# Patient Record
Sex: Female | Born: 1945
Health system: Southern US, Community
[De-identification: ages and names within clinical notes are randomized; demographics above are authoritative.]

## PROBLEM LIST (undated history)

## (undated) DIAGNOSIS — J449 Chronic obstructive pulmonary disease, unspecified: Secondary | ICD-10-CM

## (undated) DIAGNOSIS — Z923 Personal history of irradiation: Secondary | ICD-10-CM

## (undated) DIAGNOSIS — T8859XA Other complications of anesthesia, initial encounter: Secondary | ICD-10-CM

## (undated) DIAGNOSIS — M1990 Unspecified osteoarthritis, unspecified site: Secondary | ICD-10-CM

## (undated) DIAGNOSIS — E785 Hyperlipidemia, unspecified: Secondary | ICD-10-CM

## (undated) DIAGNOSIS — I739 Peripheral vascular disease, unspecified: Secondary | ICD-10-CM

## (undated) DIAGNOSIS — M79606 Pain in leg, unspecified: Secondary | ICD-10-CM

## (undated) DIAGNOSIS — Z9889 Other specified postprocedural states: Secondary | ICD-10-CM

## (undated) DIAGNOSIS — R112 Nausea with vomiting, unspecified: Secondary | ICD-10-CM

## (undated) DIAGNOSIS — R51 Headache: Secondary | ICD-10-CM

## (undated) DIAGNOSIS — Z9221 Personal history of antineoplastic chemotherapy: Secondary | ICD-10-CM

## (undated) DIAGNOSIS — C801 Malignant (primary) neoplasm, unspecified: Secondary | ICD-10-CM

## (undated) DIAGNOSIS — T4145XA Adverse effect of unspecified anesthetic, initial encounter: Secondary | ICD-10-CM

## (undated) HISTORY — DX: Hyperlipidemia, unspecified: E78.5

## (undated) HISTORY — DX: Chronic obstructive pulmonary disease, unspecified: J44.9

## (undated) HISTORY — DX: Pain in leg, unspecified: M79.606

## (undated) HISTORY — DX: Unspecified osteoarthritis, unspecified site: M19.90

## (undated) HISTORY — DX: Headache: R51

## (undated) HISTORY — DX: Malignant (primary) neoplasm, unspecified: C80.1

## (undated) HISTORY — DX: Peripheral vascular disease, unspecified: I73.9

---

## 1973-03-15 HISTORY — PX: ABDOMINAL HYSTERECTOMY: SHX81

## 1975-03-16 HISTORY — PX: GANGLION CYST EXCISION: SHX1691

## 1998-04-04 ENCOUNTER — Other Ambulatory Visit: Admission: RE | Admit: 1998-04-04 | Discharge: 1998-04-04 | Payer: Self-pay | Admitting: Obstetrics and Gynecology

## 1998-08-29 ENCOUNTER — Encounter (INDEPENDENT_AMBULATORY_CARE_PROVIDER_SITE_OTHER): Payer: Self-pay

## 1998-08-29 ENCOUNTER — Ambulatory Visit (HOSPITAL_COMMUNITY): Admission: RE | Admit: 1998-08-29 | Discharge: 1998-08-29 | Payer: Self-pay | Admitting: *Deleted

## 1999-02-12 ENCOUNTER — Encounter: Admission: RE | Admit: 1999-02-12 | Discharge: 1999-02-12 | Payer: Self-pay | Admitting: Obstetrics and Gynecology

## 1999-02-12 ENCOUNTER — Encounter: Payer: Self-pay | Admitting: Obstetrics and Gynecology

## 1999-04-20 ENCOUNTER — Other Ambulatory Visit: Admission: RE | Admit: 1999-04-20 | Discharge: 1999-04-20 | Payer: Self-pay | Admitting: Obstetrics and Gynecology

## 1999-07-16 ENCOUNTER — Encounter: Payer: Self-pay | Admitting: Obstetrics and Gynecology

## 1999-07-16 ENCOUNTER — Encounter: Admission: RE | Admit: 1999-07-16 | Discharge: 1999-07-16 | Payer: Self-pay | Admitting: Obstetrics and Gynecology

## 2000-08-03 ENCOUNTER — Emergency Department (HOSPITAL_COMMUNITY): Admission: EM | Admit: 2000-08-03 | Discharge: 2000-08-03 | Payer: Self-pay | Admitting: Emergency Medicine

## 2000-08-11 ENCOUNTER — Encounter: Admission: RE | Admit: 2000-08-11 | Discharge: 2000-08-11 | Payer: Self-pay | Admitting: Obstetrics and Gynecology

## 2000-08-11 ENCOUNTER — Encounter: Payer: Self-pay | Admitting: Obstetrics and Gynecology

## 2000-09-19 ENCOUNTER — Other Ambulatory Visit: Admission: RE | Admit: 2000-09-19 | Discharge: 2000-09-19 | Payer: Self-pay | Admitting: Obstetrics and Gynecology

## 2001-08-03 ENCOUNTER — Other Ambulatory Visit: Admission: RE | Admit: 2001-08-03 | Discharge: 2001-08-03 | Payer: Self-pay | Admitting: Obstetrics and Gynecology

## 2001-08-14 ENCOUNTER — Encounter: Admission: RE | Admit: 2001-08-14 | Discharge: 2001-08-14 | Payer: Self-pay | Admitting: Obstetrics and Gynecology

## 2002-08-21 ENCOUNTER — Encounter: Payer: Self-pay | Admitting: Obstetrics and Gynecology

## 2002-08-21 ENCOUNTER — Encounter: Admission: RE | Admit: 2002-08-21 | Discharge: 2002-08-21 | Payer: Self-pay | Admitting: Obstetrics and Gynecology

## 2002-09-05 ENCOUNTER — Other Ambulatory Visit: Admission: RE | Admit: 2002-09-05 | Discharge: 2002-09-05 | Payer: Self-pay | Admitting: Obstetrics and Gynecology

## 2003-03-16 HISTORY — PX: BREAST LUMPECTOMY: SHX2

## 2003-10-10 ENCOUNTER — Encounter: Admission: RE | Admit: 2003-10-10 | Discharge: 2003-10-10 | Payer: Self-pay | Admitting: Obstetrics and Gynecology

## 2003-10-11 ENCOUNTER — Encounter: Admission: RE | Admit: 2003-10-11 | Discharge: 2003-10-11 | Payer: Self-pay | Admitting: Obstetrics and Gynecology

## 2003-10-11 ENCOUNTER — Encounter (INDEPENDENT_AMBULATORY_CARE_PROVIDER_SITE_OTHER): Payer: Self-pay | Admitting: *Deleted

## 2003-10-22 ENCOUNTER — Encounter: Admission: RE | Admit: 2003-10-22 | Discharge: 2003-10-22 | Payer: Self-pay | Admitting: *Deleted

## 2003-10-29 ENCOUNTER — Encounter: Admission: RE | Admit: 2003-10-29 | Discharge: 2003-10-29 | Payer: Self-pay | Admitting: General Surgery

## 2003-10-31 ENCOUNTER — Ambulatory Visit (HOSPITAL_COMMUNITY): Admission: RE | Admit: 2003-10-31 | Discharge: 2003-10-31 | Payer: Self-pay | Admitting: General Surgery

## 2003-10-31 ENCOUNTER — Encounter (INDEPENDENT_AMBULATORY_CARE_PROVIDER_SITE_OTHER): Payer: Self-pay | Admitting: *Deleted

## 2003-10-31 ENCOUNTER — Ambulatory Visit (HOSPITAL_BASED_OUTPATIENT_CLINIC_OR_DEPARTMENT_OTHER): Admission: RE | Admit: 2003-10-31 | Discharge: 2003-10-31 | Payer: Self-pay | Admitting: General Surgery

## 2003-10-31 ENCOUNTER — Encounter: Admission: RE | Admit: 2003-10-31 | Discharge: 2003-10-31 | Payer: Self-pay | Admitting: General Surgery

## 2003-11-06 ENCOUNTER — Encounter: Admission: RE | Admit: 2003-11-06 | Discharge: 2003-11-06 | Payer: Self-pay | Admitting: General Surgery

## 2003-11-11 ENCOUNTER — Encounter (INDEPENDENT_AMBULATORY_CARE_PROVIDER_SITE_OTHER): Payer: Self-pay | Admitting: Specialist

## 2003-11-11 ENCOUNTER — Ambulatory Visit (HOSPITAL_BASED_OUTPATIENT_CLINIC_OR_DEPARTMENT_OTHER): Admission: RE | Admit: 2003-11-11 | Discharge: 2003-11-11 | Payer: Self-pay | Admitting: General Surgery

## 2003-11-11 ENCOUNTER — Encounter: Admission: RE | Admit: 2003-11-11 | Discharge: 2003-11-11 | Payer: Self-pay | Admitting: *Deleted

## 2003-11-11 ENCOUNTER — Ambulatory Visit (HOSPITAL_COMMUNITY): Admission: RE | Admit: 2003-11-11 | Discharge: 2003-11-11 | Payer: Self-pay | Admitting: General Surgery

## 2003-11-29 ENCOUNTER — Other Ambulatory Visit: Admission: RE | Admit: 2003-11-29 | Discharge: 2003-11-29 | Payer: Self-pay | Admitting: Obstetrics and Gynecology

## 2003-12-06 ENCOUNTER — Ambulatory Visit (HOSPITAL_COMMUNITY): Admission: RE | Admit: 2003-12-06 | Discharge: 2003-12-06 | Payer: Self-pay | Admitting: Oncology

## 2003-12-10 ENCOUNTER — Ambulatory Visit: Admission: RE | Admit: 2003-12-10 | Discharge: 2004-01-22 | Payer: Self-pay | Admitting: *Deleted

## 2003-12-11 ENCOUNTER — Encounter: Payer: Self-pay | Admitting: Cardiovascular Disease

## 2003-12-11 ENCOUNTER — Ambulatory Visit: Admission: RE | Admit: 2003-12-11 | Discharge: 2003-12-11 | Payer: Self-pay | Admitting: Oncology

## 2003-12-19 ENCOUNTER — Ambulatory Visit (HOSPITAL_BASED_OUTPATIENT_CLINIC_OR_DEPARTMENT_OTHER): Admission: RE | Admit: 2003-12-19 | Discharge: 2003-12-19 | Payer: Self-pay | Admitting: General Surgery

## 2004-01-16 ENCOUNTER — Ambulatory Visit: Payer: Self-pay | Admitting: Oncology

## 2004-03-03 ENCOUNTER — Ambulatory Visit: Payer: Self-pay | Admitting: Oncology

## 2004-03-31 ENCOUNTER — Ambulatory Visit (HOSPITAL_COMMUNITY): Admission: RE | Admit: 2004-03-31 | Discharge: 2004-03-31 | Payer: Self-pay | Admitting: Oncology

## 2004-04-06 ENCOUNTER — Ambulatory Visit: Admission: RE | Admit: 2004-04-06 | Discharge: 2004-06-25 | Payer: Self-pay | Admitting: *Deleted

## 2004-04-17 ENCOUNTER — Ambulatory Visit (HOSPITAL_COMMUNITY): Admission: RE | Admit: 2004-04-17 | Discharge: 2004-04-17 | Payer: Self-pay | Admitting: Emergency Medicine

## 2004-04-20 ENCOUNTER — Ambulatory Visit (HOSPITAL_COMMUNITY): Admission: RE | Admit: 2004-04-20 | Discharge: 2004-04-21 | Payer: Self-pay | Admitting: Vascular Surgery

## 2004-04-20 HISTORY — PX: ILIAC ARTERY STENT: SHX1786

## 2004-04-21 ENCOUNTER — Ambulatory Visit: Payer: Self-pay | Admitting: Oncology

## 2004-04-28 ENCOUNTER — Ambulatory Visit (HOSPITAL_BASED_OUTPATIENT_CLINIC_OR_DEPARTMENT_OTHER): Admission: RE | Admit: 2004-04-28 | Discharge: 2004-04-28 | Payer: Self-pay | Admitting: General Surgery

## 2004-06-25 ENCOUNTER — Ambulatory Visit: Payer: Self-pay | Admitting: Oncology

## 2004-07-21 ENCOUNTER — Ambulatory Visit: Admission: RE | Admit: 2004-07-21 | Discharge: 2004-07-21 | Payer: Self-pay | Admitting: *Deleted

## 2004-10-12 ENCOUNTER — Encounter: Admission: RE | Admit: 2004-10-12 | Discharge: 2004-10-12 | Payer: Self-pay | Admitting: Oncology

## 2004-10-13 ENCOUNTER — Encounter: Admission: RE | Admit: 2004-10-13 | Discharge: 2004-10-13 | Payer: Self-pay | Admitting: Family Medicine

## 2004-12-18 ENCOUNTER — Ambulatory Visit (HOSPITAL_COMMUNITY): Admission: RE | Admit: 2004-12-18 | Discharge: 2004-12-18 | Payer: Self-pay | Admitting: Oncology

## 2004-12-23 ENCOUNTER — Other Ambulatory Visit: Admission: RE | Admit: 2004-12-23 | Discharge: 2004-12-23 | Payer: Self-pay | Admitting: Obstetrics and Gynecology

## 2004-12-24 ENCOUNTER — Ambulatory Visit: Payer: Self-pay | Admitting: Oncology

## 2005-06-25 ENCOUNTER — Ambulatory Visit: Payer: Self-pay | Admitting: Oncology

## 2005-06-25 LAB — COMPREHENSIVE METABOLIC PANEL
ALT: 21 U/L (ref 0–40)
CO2: 24 mEq/L (ref 19–32)
Calcium: 9.1 mg/dL (ref 8.4–10.5)
Chloride: 104 mEq/L (ref 96–112)
Glucose, Bld: 81 mg/dL (ref 70–99)
Sodium: 139 mEq/L (ref 135–145)
Total Bilirubin: 0.3 mg/dL (ref 0.3–1.2)
Total Protein: 6.6 g/dL (ref 6.0–8.3)

## 2005-06-25 LAB — CBC WITH DIFFERENTIAL/PLATELET
BASO%: 0.5 % (ref 0.0–2.0)
Eosinophils Absolute: 0.2 10*3/uL (ref 0.0–0.5)
HCT: 37.1 % (ref 34.8–46.6)
LYMPH%: 21.9 % (ref 14.0–48.0)
MONO#: 0.7 10*3/uL (ref 0.1–0.9)
NEUT#: 5.5 10*3/uL (ref 1.5–6.5)
NEUT%: 66.9 % (ref 39.6–76.8)
Platelets: 289 10*3/uL (ref 145–400)
WBC: 8.2 10*3/uL (ref 3.9–10.0)
lymph#: 1.8 10*3/uL (ref 0.9–3.3)

## 2005-06-25 LAB — CANCER ANTIGEN 27.29: CA 27.29: 15 U/mL (ref 0–39)

## 2005-10-13 ENCOUNTER — Encounter: Admission: RE | Admit: 2005-10-13 | Discharge: 2005-10-13 | Payer: Self-pay | Admitting: Oncology

## 2005-12-08 ENCOUNTER — Encounter: Admission: RE | Admit: 2005-12-08 | Discharge: 2005-12-08 | Payer: Self-pay | Admitting: Emergency Medicine

## 2005-12-22 ENCOUNTER — Ambulatory Visit: Payer: Self-pay | Admitting: Oncology

## 2006-06-21 ENCOUNTER — Ambulatory Visit: Payer: Self-pay | Admitting: Oncology

## 2006-06-24 LAB — COMPREHENSIVE METABOLIC PANEL
ALT: 19 U/L (ref 0–35)
AST: 16 U/L (ref 0–37)
Albumin: 4.4 g/dL (ref 3.5–5.2)
CO2: 27 mEq/L (ref 19–32)
Calcium: 9.5 mg/dL (ref 8.4–10.5)
Chloride: 103 mEq/L (ref 96–112)
Creatinine, Ser: 0.72 mg/dL (ref 0.40–1.20)
Potassium: 4.3 mEq/L (ref 3.5–5.3)
Sodium: 140 mEq/L (ref 135–145)
Total Protein: 6.7 g/dL (ref 6.0–8.3)

## 2006-06-24 LAB — CBC WITH DIFFERENTIAL/PLATELET
BASO%: 0.6 % (ref 0.0–2.0)
EOS%: 3.6 % (ref 0.0–7.0)
HCT: 37 % (ref 34.8–46.6)
MCH: 33.2 pg (ref 26.0–34.0)
MCHC: 35.5 g/dL (ref 32.0–36.0)
MONO#: 0.6 10*3/uL (ref 0.1–0.9)
NEUT%: 61.7 % (ref 39.6–76.8)
RBC: 3.95 10*6/uL (ref 3.70–5.32)
RDW: 13 % (ref 11.3–14.5)
WBC: 8.9 10*3/uL (ref 3.9–10.0)
lymph#: 2.4 10*3/uL (ref 0.9–3.3)

## 2006-06-24 LAB — CANCER ANTIGEN 27.29: CA 27.29: 13 U/mL (ref 0–39)

## 2006-10-17 ENCOUNTER — Encounter: Admission: RE | Admit: 2006-10-17 | Discharge: 2006-10-17 | Payer: Self-pay | Admitting: Oncology

## 2006-11-15 ENCOUNTER — Ambulatory Visit: Payer: Self-pay | Admitting: Vascular Surgery

## 2006-12-13 ENCOUNTER — Ambulatory Visit: Payer: Self-pay | Admitting: Oncology

## 2006-12-15 LAB — CBC WITH DIFFERENTIAL/PLATELET
EOS%: 0.2 % (ref 0.0–7.0)
LYMPH%: 10.1 % — ABNORMAL LOW (ref 14.0–48.0)
MCH: 32.8 pg (ref 26.0–34.0)
MCV: 93.6 fL (ref 81.0–101.0)
MONO%: 5.2 % (ref 0.0–13.0)
RBC: 3.92 10*6/uL (ref 3.70–5.32)
RDW: 13.1 % (ref 11.3–14.5)

## 2006-12-16 LAB — COMPREHENSIVE METABOLIC PANEL
AST: 14 U/L (ref 0–37)
Albumin: 4.2 g/dL (ref 3.5–5.2)
Alkaline Phosphatase: 65 U/L (ref 39–117)
BUN: 19 mg/dL (ref 6–23)
Potassium: 3.8 mEq/L (ref 3.5–5.3)
Sodium: 138 mEq/L (ref 135–145)
Total Bilirubin: 0.3 mg/dL (ref 0.3–1.2)
Total Protein: 6.6 g/dL (ref 6.0–8.3)

## 2007-06-20 ENCOUNTER — Ambulatory Visit: Payer: Self-pay | Admitting: Oncology

## 2007-06-22 LAB — COMPREHENSIVE METABOLIC PANEL
BUN: 19 mg/dL (ref 6–23)
CO2: 24 mEq/L (ref 19–32)
Calcium: 9.1 mg/dL (ref 8.4–10.5)
Chloride: 106 mEq/L (ref 96–112)
Creatinine, Ser: 0.84 mg/dL (ref 0.40–1.20)

## 2007-06-22 LAB — CBC WITH DIFFERENTIAL/PLATELET
Basophils Absolute: 0 10*3/uL (ref 0.0–0.1)
HCT: 36.8 % (ref 34.8–46.6)
HGB: 12.9 g/dL (ref 11.6–15.9)
MCH: 32.5 pg (ref 26.0–34.0)
MONO#: 0.4 10*3/uL (ref 0.1–0.9)
NEUT%: 73.8 % (ref 39.6–76.8)
WBC: 9.1 10*3/uL (ref 3.9–10.0)
lymph#: 1.8 10*3/uL (ref 0.9–3.3)

## 2007-06-22 LAB — CANCER ANTIGEN 27.29: CA 27.29: 16 U/mL (ref 0–39)

## 2007-06-24 ENCOUNTER — Ambulatory Visit (HOSPITAL_COMMUNITY): Admission: RE | Admit: 2007-06-24 | Discharge: 2007-06-24 | Payer: Self-pay | Admitting: Oncology

## 2007-10-17 ENCOUNTER — Ambulatory Visit: Payer: Self-pay | Admitting: Vascular Surgery

## 2007-10-18 ENCOUNTER — Encounter: Admission: RE | Admit: 2007-10-18 | Discharge: 2007-10-18 | Payer: Self-pay | Admitting: Oncology

## 2007-12-22 ENCOUNTER — Ambulatory Visit: Payer: Self-pay | Admitting: Oncology

## 2007-12-26 LAB — CBC WITH DIFFERENTIAL/PLATELET
BASO%: 1.4 % (ref 0.0–2.0)
HCT: 37.3 % (ref 34.8–46.6)
LYMPH%: 21.7 % (ref 14.0–48.0)
MCH: 33 pg (ref 26.0–34.0)
MCHC: 34.8 g/dL (ref 32.0–36.0)
MCV: 94.8 fL (ref 81.0–101.0)
MONO#: 0.5 10*3/uL (ref 0.1–0.9)
MONO%: 5.6 % (ref 0.0–13.0)
NEUT%: 68.8 % (ref 39.6–76.8)
Platelets: 255 10*3/uL (ref 145–400)
WBC: 8.3 10*3/uL (ref 3.9–10.0)

## 2007-12-26 LAB — COMPREHENSIVE METABOLIC PANEL
ALT: 38 U/L — ABNORMAL HIGH (ref 0–35)
Alkaline Phosphatase: 79 U/L (ref 39–117)
CO2: 23 mEq/L (ref 19–32)
Creatinine, Ser: 0.96 mg/dL (ref 0.40–1.20)
Total Bilirubin: 0.3 mg/dL (ref 0.3–1.2)

## 2007-12-26 LAB — CANCER ANTIGEN 27.29: CA 27.29: 15 U/mL (ref 0–39)

## 2008-06-21 ENCOUNTER — Ambulatory Visit: Payer: Self-pay | Admitting: Oncology

## 2008-06-25 LAB — LACTATE DEHYDROGENASE: LDH: 170 U/L (ref 94–250)

## 2008-06-25 LAB — COMPREHENSIVE METABOLIC PANEL
ALT: 29 U/L (ref 0–35)
AST: 18 U/L (ref 0–37)
Albumin: 3.7 g/dL (ref 3.5–5.2)
CO2: 29 mEq/L (ref 19–32)
Calcium: 9.3 mg/dL (ref 8.4–10.5)
Chloride: 105 mEq/L (ref 96–112)
Potassium: 4.1 mEq/L (ref 3.5–5.3)
Total Protein: 6.8 g/dL (ref 6.0–8.3)

## 2008-06-25 LAB — CBC WITH DIFFERENTIAL/PLATELET
BASO%: 0.2 % (ref 0.0–2.0)
EOS%: 1.5 % (ref 0.0–7.0)
HCT: 39.3 % (ref 34.8–46.6)
HGB: 13.4 g/dL (ref 11.6–15.9)
MCH: 32.1 pg (ref 25.1–34.0)
MCHC: 34 g/dL (ref 31.5–36.0)
MONO#: 0.3 10*3/uL (ref 0.1–0.9)
RDW: 14.1 % (ref 11.2–14.5)
WBC: 10.1 10*3/uL (ref 3.9–10.3)
lymph#: 2 10*3/uL (ref 0.9–3.3)

## 2008-10-22 ENCOUNTER — Encounter: Admission: RE | Admit: 2008-10-22 | Discharge: 2008-10-22 | Payer: Self-pay | Admitting: Oncology

## 2008-10-29 ENCOUNTER — Ambulatory Visit: Payer: Self-pay | Admitting: Vascular Surgery

## 2009-01-02 ENCOUNTER — Ambulatory Visit: Payer: Self-pay | Admitting: Oncology

## 2009-01-06 LAB — COMPREHENSIVE METABOLIC PANEL
Albumin: 3.8 g/dL (ref 3.5–5.2)
Alkaline Phosphatase: 63 U/L (ref 39–117)
BUN: 15 mg/dL (ref 6–23)
CO2: 29 mEq/L (ref 19–32)
Glucose, Bld: 94 mg/dL (ref 70–99)
Sodium: 139 mEq/L (ref 135–145)
Total Bilirubin: 0.5 mg/dL (ref 0.3–1.2)
Total Protein: 6.6 g/dL (ref 6.0–8.3)

## 2009-01-06 LAB — CBC WITH DIFFERENTIAL/PLATELET
Basophils Absolute: 0 10*3/uL (ref 0.0–0.1)
Eosinophils Absolute: 0.2 10*3/uL (ref 0.0–0.5)
HCT: 39.2 % (ref 34.8–46.6)
HGB: 13.1 g/dL (ref 11.6–15.9)
LYMPH%: 22.4 % (ref 14.0–49.7)
MCV: 95.7 fL (ref 79.5–101.0)
MONO#: 0.6 10*3/uL (ref 0.1–0.9)
MONO%: 7 % (ref 0.0–14.0)
NEUT#: 5.9 10*3/uL (ref 1.5–6.5)
Platelets: 255 10*3/uL (ref 145–400)
WBC: 8.7 10*3/uL (ref 3.9–10.3)

## 2009-01-07 LAB — VITAMIN D 25 HYDROXY (VIT D DEFICIENCY, FRACTURES): Vit D, 25-Hydroxy: 26 ng/mL — ABNORMAL LOW (ref 30–89)

## 2009-01-07 LAB — CANCER ANTIGEN 27.29: CA 27.29: 16 U/mL (ref 0–39)

## 2009-10-23 ENCOUNTER — Encounter: Admission: RE | Admit: 2009-10-23 | Discharge: 2009-10-23 | Payer: Self-pay | Admitting: Obstetrics and Gynecology

## 2009-11-21 ENCOUNTER — Ambulatory Visit: Payer: Self-pay | Admitting: Vascular Surgery

## 2010-05-05 ENCOUNTER — Other Ambulatory Visit: Payer: Self-pay | Admitting: Family Medicine

## 2010-05-05 DIAGNOSIS — N281 Cyst of kidney, acquired: Secondary | ICD-10-CM

## 2010-05-19 ENCOUNTER — Ambulatory Visit
Admission: RE | Admit: 2010-05-19 | Discharge: 2010-05-19 | Disposition: A | Discharge status: Home / Self Care | Payer: BC Managed Care – PPO | Source: Ambulatory Visit | Attending: Family Medicine | Admitting: Family Medicine

## 2010-05-19 DIAGNOSIS — N281 Cyst of kidney, acquired: Secondary | ICD-10-CM

## 2010-07-31 NOTE — Consult Note (Signed)
NAME:  Kristin Williams, Kristin Williams                ACCOUNT NO.:  [id number]   MEDICAL RECORD NO.:  [id number]          PATIENT TYPE:  OUT   LOCATION:  VASC                         FACILITY:  [facility]   PHYSICIAN:  Di Kindle. Edilia Bo, M.D.DATE OF BIRTH:  [birth year]   DATE OF CONSULTATION:  DATE OF DISCHARGE:                                   CONSULTATION   REASON FOR CONSULTATION:  Ischemic right great toe.   HISTORY:  This is a pleasant 65 year old woman who is undergoing  chemotherapy for breast cancer. She completed a round of chemotherapy on  April 02, 2004.  The following day, she noticed discoloration of the right  great toe. She was sent to the vascular lab at [facility], and vascular surgery  was consulted by Dr. Georgina Pillion for further recommendations concerning the  ischemic right great toe.   This patient gives no clear-cut history of claudication on either side. She  has had no history of rest pain and no history of nonhealing wounds that she  is aware of. She has had no other episodes of discoloration of the toes.   PAST MEDICAL HISTORY:  1.  Significant for cervical cancer 30 years ago.  2.  Vaginal cancer in 1991.  3.  Breast cancer, for which she has undergone a lumpectomy for and a lymph      node dissection. She is also completed several rounds of chemotherapy      and is scheduled for radiation therapy.  4.  Hypercholesterolemia.  5.  She denies any history of diabetes, hypertension, history of previous      myocardial infarction or history of congestive heart failure. She has      had no history of COPD or stroke.   PAST SURGICAL HISTORY:  1.  Significant for a lumpectomy.  2.  Excision of ganglion cyst.  3.  Hysterectomy.   SOCIAL HISTORY:  Patient is married and has two children. She smokes a pack  per day of cigarettes and has been smoking for 40 years.   FAMILY HISTORY:  Mother died of cancer, which was a lymphoma.  Father died,  of which she believes, congestive heart  failure. He also had COPD.  She had  a brother who had a myocardial infarction in his 60s. There is no other  history of premature cardiovascular disease that she is aware of.   MEDICATIONS:  1.  Detrol LA.  2.  Protonix.  3.  Lipitor.  4.  Aspirin.  5.  Multivitamin.  6.  Calcium.   ALLERGIES:  NO KNOWN DRUG ALLERGIES.   REVIEW OF SYSTEMS:  GENERAL:  She has had a 30-pound weight gain recently.  She has had no problems with her appetite.  No fever, chills.  CARDIAC:  She  denies chest pain, chest pressure, orthopnea.  She did have some problems  with flutter while on chemotherapy.  PULMONARY:  She denies any bronchitis,  asthma, wheezing.  GI:  She had some is inflammatory bowel problems while on  chemo.  No other change of bowel habits.  No history of significant peptic  ulcer  disease. NEUROLOGIC:  She has had no dizziness, blackouts, headaches,  or seizures. She has had no history of stroke, TIA, expressive or receptive  aphasia, or amaurosis fugax.  VASCULAR:  She has had no DVT or phlebitis.  She has had no claudication, rest pain, or nonhealing ulcers.  PSYCHIATRIC:  No depression or nervousness.  HEMATOLOGIC:  No bleeding problems or  clotting disorders that she is aware of.   PHYSICAL EXAMINATION:  VITAL SIGNS:  Heart rate 78, blood pressure 120/70.  HEENT/NEC:  There is no cervical lymphadenopathy. I do not detect any  carotid bruits.  LUNGS:  Clear bilaterally to auscultation.  CARDIAC:  She has a regular rate and rhythm.  ABDOMEN:  Markedly obese and difficult to assess.  I could not palpate an  aneurysm but could not really adequately assess for this. Likewise, because  of her obesity, it is difficult to assess her groin pulses.  I could feel a  clearly palpable left femoral pulse. She appeared to have a diminished right  femoral pulse, and again she was difficult to examine.  VASCULAR:  Because of her size, I could not really palpate popliteal pulses.  She did have  a posterior tibial and dorsalis pedis pulse on the left foot. I  could not palpate pulse in the right foot, although she had a biphasic  posterior tibial signal on the right with a slightly dampened dorsalis pedis  signal in the right. She did have bluish discoloration the right great toe.  No other evidence of no evidence of atheroembolic disease.  EXTREMITIES:  She had no significant lower extremity swelling.  NEUROLOGIC:  Nonfocal.   She had Doppler studies in the office today which showed ABIs at greater  than 100% bilaterally, although she had a flat wave form in the right great  toe. They obtained triphasic wave forms in the anterior tibial position, but  I obtained a dampened monophasic signal in the dorsalis pedis position on  the right.   IMPRESSION:  Patient presents with a bluish discoloration of the right great  toe, and atheroembolic disease should be considered in the differential  diagnosis. Given that I think she has a slightly diminished right femoral  pulse and evidence of perhaps tibial occlusive disease also, I have  recommended that we proceed with an arteriogram electively to look for  proximal disease which might explain this atheroembolic event, potentially.  If the arteriogram is unremarkable, then consideration should be given to a  CT angiogram of the chest to evaluate the thoracic aorta. Will arrange for  her arteriogram on April 20, 2004, and make further recommendations  pending the results.  She also knows she needs to quit smoking.     CSD/MEDQ  D:  04/17/2004  T:  04/17/2004  Job:  [id number]   cc:   Oley Balm. Georgina Pillion, M.D.  [address]  [place]  [place] [address]  Fax: [phone]

## 2010-07-31 NOTE — Op Note (Signed)
NAME:  Kristin Williams, Kristin Williams                            ACCOUNT NO.:  [id number]   MEDICAL RECORD NO.:  [id number]                   PATIENT TYPE:  AMB   LOCATION:  DSC                                  FACILITY:  [facility]   PHYSICIAN:  Timothy E. Earlene Plater, M.D.              DATE OF BIRTH:  1945-05-30   DATE OF PROCEDURE:  10/31/2003  DATE OF DISCHARGE:                                 OPERATIVE REPORT   PREOPERATIVE DIAGNOSIS:  Carcinoma, left breast.   POSTOPERATIVE DIAGNOSIS:  Carcinoma, left breast.   OPERATION PERFORMED:  Needle directed partial mastectomy, left, and sentinel  node biopsy.   SURGEON:  Timothy E. Earlene Plater, M.D.   ANESTHESIA:  General.   INDICATIONS FOR PROCEDURE:  Ms. Kistner is 65, has recently been diagnosed  with a core biopsy of a nonpalpable mass, medial aspect, left breast.  She  has been carefully counseled and is ready to proceed with this surgery.  Prior to surgery, she was seen in the breast center where a needle  localization was accomplished.  The wire entering the medial left breast at  its inferior most margin proceeding directly superior through the mass.  Likewise, intradermal injection of radionuclear material was done one and a  half hours prior to surgery.  The patient was seen, identified, marked and  the permit signed.   DESCRIPTION OF PROCEDURE:  The patient was taken to the operating room,  placed supine, general endotracheal anesthesia was administered.  The left  breast was inspected.  The tape and bandage were removed. The wire was cut  to length and the estimated location of the tumor was marked on the skin.  Likewise, using the NeoProbe, an area of the axilla was marked that  exhibited high counts.  Methylene blue 5 ml was injected subcutaneously  around the nipple areolar complex.  This was massaged for five minutes.  Then the breast was prepped and draped in the usual fashion.   Attention was focused on the left axilla where there was one hot area  that  had been marked. This area was anesthetized with 0.25% Marcaine with  epinephrine and incision made and through the very fatty axillary contents,  I dissected down to the chest wall behind the pectoralis muscle.  There was  no blue staining whatsoever and by gentle blunt dissection, three areas of  high counts were identified and submitted to pathology.  Bleeding was  controlled with cautery or clips and that wound was closed in layers with  Monocryl.  Specimens were sent to the lab appropriately marked.   Attention was then turned to the left breast where the area of the tumor was  approximately known to be.  A horizontal incision was made in the 9 o'clock  position, left breast, medial.  I carried the incision down until the wire  was identified.  This was dissected out and then the wire was  brought up  into the wound from its percutaneous site.  The area around the tip of the  mass was widely dissected at least 2 cm on all sides of the wire and down to  the pectoralis fascia deep.  The specimen was marked and sent to the breast  center for specimen mammography.  Bleeding was controlled with the cautery.  That wound was dry and likewise, it was closed in layers with Monocryl.  We  then heard from the pathologist that all three nodes were negative and from  the radiologist that the tumor was  well contained within the specimen removed.  Therefore we were complete.  The counts were correct.  The patient was stable.  She tolerated it well,  was awakened and taken to the recovery room in good condition.   Her family was informed of the findings and instructions and Percocet give  for pain and she will be followed.                                               Timothy E. Earlene Plater, M.D.    TED/MEDQ  D:  10/31/2003  T:  11/01/2003  Job:  [id number]   cc:   [facility]

## 2010-07-31 NOTE — Op Note (Signed)
NAME:  Kristin Williams, Kristin Williams NO.:  [id number]   MEDICAL RECORD NO.:  [id number]          PATIENT TYPE:  OIB   LOCATION:  2899                         FACILITY:  [facility]   PHYSICIAN:  Di Kindle. Edilia Bo, M.D.DATE OF BIRTH:  [birth year]   DATE OF PROCEDURE:  04/20/2004  DATE OF DISCHARGE:                                 OPERATIVE REPORT   INDICATIONS:  This is a 65 year old woman who presented with a blue right  great toe. On examination she had a slightly diminished right femoral pulse  and it was felt that this was likely an atheroembolic event. She was brought  in for diagnostic arteriography with the understanding that if we found a  culprit iliac stenosis, this might be addressed at time of her arteriogram.  The procedure and potential complications were discussed with the patient  and she was evaluated at [facility] on April 17, 2004. We discussed the  indications of procedure and potential complications including but not  limited to bleeding, arterial injury, embolization, dye reaction, and kidney  failure. All questions were answered. She was agreeable to proceed.   TECHNIQUE:  The patient was taken to the PV lab at [facility] and sedated with a  milligram of Versed and 50 mcg of fentanyl. Both groins were prepped and  draped in usual sterile fashion.  After the skin was infiltrated consent  lidocaine, the right common femoral artery was cannulated and a guidewire  introduced into the infrarenal aorta under fluoroscopic control.  The 5-  French sheath was then passed over the wire and dilator removed. Pigtail  catheter was positioned at the L1 vertebral body and flush aortogram  obtained. The catheter was then repositioned by the aortic bifurcation and  oblique iliac projections were obtained. Of note, there was an eccentric  focal stenosis and soft plaque in the proximal right common iliac artery.  It was felt that this was likely the culprit the stenosis.  As  this was  right adjacent to the bifurcation, I felt that this could be addressed with  angioplasty but that we would have to protect the left common iliac system  with a balloon. Therefore, the left groin was cannulated and a guidewire  introduced into the infrarenal aorta from the left side after the skin was  anesthetized. The long 7-French sheath was advanced over the wire and  positioned in the distal common iliac artery on the left. Next a 5-French  sheath on the right was exchanged for a long 7-French sheath. The patient  was the 4000 units of IV heparin. Once this had circulated, the sheath on  the right was advanced past the stenosis, and the dilator then removed. A  genesis [id number] stent was positioned across the stenosis in the proximal right  common iliac artery and the sheath was retracted. The balloon on the left  side was then positioned across the proximal left common iliac artery. The  tumor inflated simultaneously to 8 atmospheres for 30 seconds. At the  completion, the balloons were deflated and the balloons retracted.  An  arteriogram was obtained through  a pigtail catheter which showed excellent  result with no residual stenosis. Bilateral lower extremity runoff films  were obtained. Next, additional spot films were obtained of the right leg  through the right femoral sheath. At completion, the sheaths were removed  and pressure held hemostasis stasis.  No immediate complications were noted.   FINDINGS:  There are single renal arteries bilaterally with no significant  renal artery stenosis identified. The infrarenal aorta is widely patent with  no areas of stenosis identified. There was a focal eccentric plaque in the  proximal right common iliac artery which was successfully dilated and  stented as described above.  There was no significant stenosis or narrowing  in the left common iliac or external iliac artery. The external iliac artery  on the right was also widely  patent. Both hypogastric arteries were widely  patent. The common femoral, superficial femoral and deep femoral arteries  were patent bilaterally. Popliteal arteries were patent bilaterally.  On the  left side there is three-vessel runoff although there is poor visualization  distally.   On the right side additional spot films were taken of the right leg which  shows that the posterior tibial artery is widely patent with no areas of  significant stenosis identified. The anterior tibial artery is occluded in  the distal third of the leg with collaterals suggesting this may have been  chronic. Likewise the peroneal artery is occluded in the midcalf. Lateral  projection of the foot demonstrates that the posterior tibial artery is the  only distal patent vessel with runoff into the foot. There is no  reconstitution of the dorsalis pedis artery.   CONCLUSIONS:  1.  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  This was successfully      ballooned and stent as described above.  2.  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      CSD/MEDQ  D:  04/20/2004  T:  04/20/2004  Job:  [id number]   cc:   Oley Balm. Georgina Pillion, M.D.  [address]  [place]  [place] [address]  Fax: [phone]

## 2010-07-31 NOTE — Op Note (Signed)
NAME:  Kristin Williams, Kristin Williams                ACCOUNT NO.:  [id number]   MEDICAL RECORD NO.:  [id number]          PATIENT TYPE:  AMB   LOCATION:  DSC                          FACILITY:  [facility]   PHYSICIAN:  Timothy E. Earlene Plater, M.D. DATE OF BIRTH:  December 06, 1945   DATE OF PROCEDURE:  12/19/2003  DATE OF DISCHARGE:                                 OPERATIVE REPORT   PREOPERATIVE DIAGNOSIS:  Carcinoma of the breast.   POSTOPERATIVE DIAGNOSIS:  Carcinoma of the breast.   OPERATION PERFORMED:  Insertion of Port-A-Cath.   SURGEON:  Timothy E. Earlene Plater, M.D.   ANESTHESIA:   INDICATIONS FOR PROCEDURE:  Kristin Williams recently underwent definitive  surgery for breast cancer and because of the final path report and  consultation with oncology, she wishes to proceed with aggressive  chemotherapy and a Port-A-Cath is agreed upon.  She was seen, identified and  the permit signed.   DESCRIPTION OF PROCEDURE:  She was taken back to the operating room where  she positioned herself supine with the arms tucked.  IV sedation was then  given.  The table was positioned.  The entire upper chest was prepped and  draped in the usual fashion.  I attempted to locate the left subclavian vein  and did so on the first pass, but was unable to thread the catheter and  after multiple attempts, I aborted the left side and went to the right side  where the right subclavian vein was isolated on the first pass and the  catheter threaded normally.  Please note 0.25% Marcaine with epinephrine was  used throughout for local anesthesia.  All instrumentation was irrigated  with heparinized saline and fluoroscopy was used real time to ascertain the  correct position.  The sheath was passed over the wire, the wire removed.  The catheter device inserted through the sheath and the sheath removed and  then a pocket was made on the chest wall below the catheter insertion site.  The catheter was tunneled subcutaneously and connected to the  port device.  The system worked, irrigated and aspirated normally.  It seemed to be in  good position.  Careful real time fluoroscopy was used bilaterally to  ascertain full expansion of the lungs.  No complications were apparent.  The  wounds were closed with 3-0 Monocryl and Steri-Strips.  A clear Tegaderm  dressing was applied and the access device was then flushed with  concentrated heparin and left intact for use tomorrow.  The patient  tolerated the procedure well.  Counts were correct.   Written and verbal instructions given to her and her family.  She will be  seen and followed routinely as an outpatient.       TED/MEDQ  D:  12/20/2003  T:  12/20/2003  Job:  [id number]   cc:   [facility]

## 2010-07-31 NOTE — Op Note (Signed)
NAME:  Kristin Williams, Kristin Williams                ACCOUNT NO.:  [id number]   MEDICAL RECORD NO.:  [id number]          PATIENT TYPE:  OUT   LOCATION:  DFTL                         FACILITY:  [facility]   PHYSICIAN:  Timothy E. Earlene Plater, M.D. DATE OF BIRTH:  [birth year]   DATE OF PROCEDURE:  04/28/2004  DATE OF DISCHARGE:                                 OPERATIVE REPORT   PREOPERATIVE DIAGNOSIS:  Carcinoma of the breast.   POSTOPERATIVE DIAGNOSIS:  Carcinoma of the breast.   PROCEDURE:  Removal of port-A-Cath.   SURGEON:  Timothy E. Earlene Plater, M.D.   ANESTHESIA:  Local standby.   [name] is 50.  She has completed six rounds of chemotherapy for breast  cancer.  She is now ready to proceed with radiation.  We are finished with  the port-A-Cath, and radiation would like it removed prior to the start.  She agrees and understands.   She was seen, identified, and the permit signed.   She was taken to the operating room, laid supine.  IV sedation given.  The  right upper chest was prepped and draped in the usual fashion.  0.25%  Marcaine with epinephrine was used for local anesthesia.  The old incision  was used.  The incision was carried down into the port-A-Cath pocket.  The  port-A-Cath was grasped, the sutures cut and removed, and the port-A-Cath  removed intact and without complications.  There was no bleeding or  complication.  The wound was closed in layers with 3-0 Monocryl.  Steri-  Strips and dry sterile dressing applied.  She tolerated it well.  All counts  correct.  She was removed to the recovery room in good condition.  Written  and verbal instructions given.  She will be followed in the office.      TED/MEDQ  D:  04/28/2004  T:  04/28/2004  Job:  [id number]   cc:   Valentino Hue. Magrinat, M.D.  [address] [facility]  [place]  [place] [address]  Fax: [phone]

## 2010-07-31 NOTE — Op Note (Signed)
NAME:  Kristin Williams, Kristin Williams                            ACCOUNT NO.:  [id number]   MEDICAL RECORD NO.:  [id number]                   PATIENT TYPE:  AMB   LOCATION:  DSC                                  FACILITY:  [facility]   PHYSICIAN:  Timothy E. Earlene Plater, M.D.              DATE OF BIRTH:  02-May-1945   DATE OF PROCEDURE:  11/11/2003  DATE OF DISCHARGE:  11/11/2003                                 OPERATIVE REPORT   PREOPERATIVE DIAGNOSIS:  Carcinoma, left breast.   POSTOPERATIVE DIAGNOSIS:  Carcinoma, left breast.   OPERATION PERFORMED:  Lumpectomy with needle localization.   SURGEON:  Timothy E. Earlene Plater, M.D.   ANESTHESIA:  General.   INDICATIONS FOR PROCEDURE:  Kristin Williams is 59, had an abnormal mammogram, a  lesion in the left breast lower inner quadrant.  Core biopsy was done,  showing invasive carcinoma.  The patient has been counseled.  She underwent  surgery on August 18 with needle localization.  At that time she had  sentinel node biopsy which was negative.  With the needle localization and  the specimen mammogram being reported as showing tumor within specimen  resected, the patient was seen and followed postoperatively.  However, the  final pathology report and careful sectioning of the tissue did not reveal  any tumor.  The patient and her family have been properly counseled by  myself and Daryl Eastern, M.D. at the breast center and they are ready  to proceed now with re-exploration and needle guidance.  This has been  scheduled sooner rather than later at the patient's request and we are quite  agreeable.  On the day of surgery, she underwent two wire location, needle  locators at the breast center, was then brought to the [facility].  She was seen, identified and the permit signed.   DESCRIPTION OF PROCEDURE:  The patient was taken to the operating room,  placed supine.  By her choice, she had LMA general anesthesia.  The left  breast was exposed.  The wires  were identified entering the breast.  One  wire entered at 7 o'clock going directly vertical.  It should have been  directly under the mass.  The second wire entered at the 9 o'clock position  going directly horizontal and it was through the mass.  With this alignment,  the breast was prepped and draped in the usual fashion.  I did inject local  anesthesia.  I opened the old wound which was at the 9 o'clock position,  extended it medially.  I gently dissected through the fatty tissue of the  breast and located both wires, brought them from their insertion site into  the breast site and then completely excised the area where they crossed.  I  personally carefully examined this tissue and felt as though I observed the  tumor.  Because it was on the anterior surface,  I did resect another rim of  tissue anterior to the tumor, i.e. superficial.  All this tissue was marked  carefully.  This was sent to the breast center lab where a specimen  mammogram  revealed the tumor to be within the specimen as well the wires.  Meanwhile the breast cavity was controlled with cautery.  It was completely  dry.  It was irrigated and clear.  All counts correct.  The breast was  closed with 3-0 Monocryl. The patient tolerated the  procedure well.  Final counts were correct.  A dry sterile dressing was  applied over Steri-Strips.  She was removed to the recovery room in good  condition.  The family was informed of the findings and are to call in three  days for a report.                                               Timothy E. Earlene Plater, M.D.    TED/MEDQ  D:  11/12/2003  T:  11/13/2003  Job:  [id number]   cc:   [facility]

## 2010-09-14 ENCOUNTER — Other Ambulatory Visit: Payer: Self-pay | Admitting: Obstetrics and Gynecology

## 2010-09-14 DIAGNOSIS — Z9889 Other specified postprocedural states: Secondary | ICD-10-CM

## 2010-10-26 ENCOUNTER — Ambulatory Visit
Admission: RE | Admit: 2010-10-26 | Discharge: 2010-10-26 | Disposition: A | Discharge status: Home / Self Care | Payer: BC Managed Care – PPO | Source: Ambulatory Visit | Attending: Obstetrics and Gynecology | Admitting: Obstetrics and Gynecology

## 2010-10-26 DIAGNOSIS — Z9889 Other specified postprocedural states: Secondary | ICD-10-CM

## 2010-11-24 ENCOUNTER — Encounter: Payer: Self-pay | Admitting: Thoracic Diseases

## 2010-11-25 ENCOUNTER — Encounter: Payer: Self-pay | Admitting: Thoracic Diseases

## 2010-11-26 ENCOUNTER — Encounter: Payer: Self-pay | Admitting: Thoracic Diseases

## 2010-11-26 ENCOUNTER — Encounter (INDEPENDENT_AMBULATORY_CARE_PROVIDER_SITE_OTHER): Payer: BC Managed Care – PPO | Admitting: *Deleted

## 2010-11-26 ENCOUNTER — Ambulatory Visit (INDEPENDENT_AMBULATORY_CARE_PROVIDER_SITE_OTHER): Payer: BC Managed Care – PPO | Admitting: Thoracic Diseases

## 2010-11-26 VITALS — BP 124/51 | HR 77

## 2010-11-26 DIAGNOSIS — Z48812 Encounter for surgical aftercare following surgery on the circulatory system: Secondary | ICD-10-CM

## 2010-11-26 DIAGNOSIS — I771 Stricture of artery: Secondary | ICD-10-CM

## 2010-11-26 DIAGNOSIS — I739 Peripheral vascular disease, unspecified: Secondary | ICD-10-CM

## 2010-11-26 NOTE — Progress Notes (Signed)
[facility] [place]  Postoperative Visit Bypass Surgery Date of Surgery: 04/20/2004 Right Iliac PTA and Stent Surgeon: CSD  History of Present Illness  Kristin Williams is a 65 y.o. female who presents for postoperative follow-up for: right Interventional Vascular Procedure surgery for right iliac occlusive disease.  She had a PTA and stenting of the right Iliac artery. She has done extremely well and can now walk without a cane. 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 She states she gets the pain when she stands too long or walks a long distance but denies Claudication type symptoms. She denies night or rest pain.  VASC. LAB Studies: 11/26/2010        ABI: Right 1.15;  Left 1.29;          Current outpatient prescriptions:alendronate (FOSAMAX) 35 MG tablet, , Disp: , Rfl: ;  aspirin 81 MG EC tablet, Take 81 mg by mouth daily.  , Disp: , Rfl: ;  atorvastatin (LIPITOR) 10 MG tablet, Take 10 mg by mouth daily.  , Disp: , Rfl: ;  Calcium Carbonate-Vitamin D (CALCIUM + D PO), Take by mouth 2 (two) times daily.  , Disp: , Rfl: ;  Multiple Vitamin (MULTIVITAMIN PO), Take by mouth daily.  , Disp: , Rfl:  omeprazole (PRILOSEC) 20 MG capsule, , Disp: , Rfl:  fish oil 2 tabs daily  Past Medical History  Diagnosis Date  . Arthritis   . Cancer     Cervical  . Cancer     Breast  . Cancer     Vaginal  . Hyperlipidemia   . Leg pain   . Headache   . Peripheral vascular disease    History  Substance Use Topics  . Smoking status: Current Everyday Smoker -- 1.0 packs/day for 40 years    Types: Cigarettes  . Smokeless tobacco: Not on file  . Alcohol Use:    Family History  Problem Relation Age of Onset  . Cancer Mother   . Heart failure Father   . COPD Father   . Heart disease Brother 50    MI   ROS: [x]  Positive   [ ]  Negative   [ ]  All sytems reviewed and are negative  General:[ ]  Weight  loss, [ ]  Fever, [ ]  chills Neurologic: [ ]  Dizziness, [ ]  Blackouts, [ ]  Seizure [ ]  Stroke, [ ]  "Mini stroke", [ ]  Slurred speech, [ ]  Temporary blindness;  [ ] weakness,  [ ]  Hoarseness Cardiac: [ ]  Chest pain/pressure, [ ]  Shortness of breath at rest [ ]  Shortness of breath with exertion,  [ ]   Atrial fibrillation or irregular heartbeat Vascular:[ ]  Pain in legs with walking, [ ]  Pain in legs at rest ,[ ]  Pain in legs at night,  [ ]   Non-healing ulcer, [ ]  Blood clot in vein/DVT,   Pulmonary: [ ]  Home oxygen, [ ]   Productive cough, [ ]  Coughing up blood,  [ ]  Asthma,  [ ]  Wheezing Musculoskeletal:  [ x] Arthritis, [ x] Low back pain,  [ ]  Joint pain Hematologic:[ ]  Easy Bruising, [ ]  Anemia; [ ]  Hepatitis Gastrointestinal: [ ]  Blood in stool,  [ ]  Gastroesophageal Reflux, [ ]  Trouble swallowing Urinary: [ ]  chronic Kidney disease, [ ]  on HD - [ ]  MWF or [ ]  TTHS, [ ]  Burning with urination, [ ]  Frequent urination, [ ]  Difficulty urinating;  Skin: [ ]  Rashes, [ ]  Wounds Psychological: [ ]  Anxiety,  [ ]  Depression     Physical Examination  Filed Vitals:   11/26/10 1412  BP: 124/51  Pulse: 77    Pt is A&O x 3 with appropriate affect Gait is normal, slightly bent at waist WDWN female in NAD HEENT: PEERL LUNGS: CTAB Heart: RRR MS: BLE warm and well perfused Fem pulses palp bilat. Dorsalis Pedis pulse are absent bilat. Posterior tibial pulse are  Palpable bilat. Neuro: good sensation and motion BLE Skin: no ulcers or lesions   Medical Decision Making  Kristin Williams is a 65 y.o. year old female who presents s/p right lower extremity bypass surgery .  The patient has excellent resolution of pre-operative symptoms. The patient's surveillance will included ABI  which will be  in: 1 year.    Clinic MD: CS Edilia Bo, MD

## 2010-12-09 ENCOUNTER — Other Ambulatory Visit: Payer: Self-pay | Admitting: Thoracic Diseases

## 2011-04-06 ENCOUNTER — Ambulatory Visit (INDEPENDENT_AMBULATORY_CARE_PROVIDER_SITE_OTHER): Payer: BC Managed Care – PPO | Admitting: Internal Medicine

## 2011-04-06 DIAGNOSIS — R05 Cough: Secondary | ICD-10-CM

## 2011-04-06 LAB — PULMONARY FUNCTION TEST

## 2011-04-06 NOTE — Progress Notes (Signed)
PFT was done today.  

## 2011-04-13 ENCOUNTER — Encounter: Payer: Self-pay | Admitting: Family Medicine

## 2011-07-06 ENCOUNTER — Other Ambulatory Visit: Payer: Self-pay | Admitting: Obstetrics and Gynecology

## 2011-09-21 ENCOUNTER — Other Ambulatory Visit: Payer: Self-pay | Admitting: Obstetrics and Gynecology

## 2011-09-21 DIAGNOSIS — Z853 Personal history of malignant neoplasm of breast: Secondary | ICD-10-CM

## 2011-09-21 DIAGNOSIS — Z1231 Encounter for screening mammogram for malignant neoplasm of breast: Secondary | ICD-10-CM

## 2011-10-28 ENCOUNTER — Ambulatory Visit
Admission: RE | Admit: 2011-10-28 | Discharge: 2011-10-28 | Disposition: A | Discharge status: Home / Self Care | Payer: BC Managed Care – PPO | Source: Ambulatory Visit | Attending: Obstetrics and Gynecology | Admitting: Obstetrics and Gynecology

## 2011-10-28 DIAGNOSIS — Z853 Personal history of malignant neoplasm of breast: Secondary | ICD-10-CM

## 2011-10-28 DIAGNOSIS — Z1231 Encounter for screening mammogram for malignant neoplasm of breast: Secondary | ICD-10-CM

## 2011-11-26 ENCOUNTER — Other Ambulatory Visit: Payer: BC Managed Care – PPO

## 2011-11-30 ENCOUNTER — Encounter: Payer: Self-pay | Admitting: Neurosurgery

## 2011-12-01 ENCOUNTER — Encounter: Payer: Self-pay | Admitting: Neurosurgery

## 2011-12-01 ENCOUNTER — Ambulatory Visit: Payer: BC Managed Care – PPO | Admitting: Neurosurgery

## 2011-12-01 ENCOUNTER — Encounter (INDEPENDENT_AMBULATORY_CARE_PROVIDER_SITE_OTHER): Payer: BC Managed Care – PPO | Admitting: *Deleted

## 2011-12-01 VITALS — BP 146/79 | HR 67 | Resp 18 | Ht 62.0 in | Wt 246.6 lb

## 2011-12-01 DIAGNOSIS — I771 Stricture of artery: Secondary | ICD-10-CM

## 2012-01-19 NOTE — Progress Notes (Unsigned)
Kristin Williams's appointment was begun, but not completed.  The provider was running late to the office and the patient elected not to wait to be seen.

## 2012-06-01 DIAGNOSIS — M5137 Other intervertebral disc degeneration, lumbosacral region: Secondary | ICD-10-CM | POA: Diagnosis not present

## 2012-06-01 DIAGNOSIS — IMO0002 Reserved for concepts with insufficient information to code with codable children: Secondary | ICD-10-CM | POA: Diagnosis not present

## 2012-06-01 DIAGNOSIS — M4310 Spondylolisthesis, site unspecified: Secondary | ICD-10-CM | POA: Diagnosis not present

## 2012-06-01 DIAGNOSIS — M999 Biomechanical lesion, unspecified: Secondary | ICD-10-CM | POA: Diagnosis not present

## 2012-06-22 DIAGNOSIS — M999 Biomechanical lesion, unspecified: Secondary | ICD-10-CM | POA: Diagnosis not present

## 2012-06-22 DIAGNOSIS — M5137 Other intervertebral disc degeneration, lumbosacral region: Secondary | ICD-10-CM | POA: Diagnosis not present

## 2012-06-22 DIAGNOSIS — IMO0002 Reserved for concepts with insufficient information to code with codable children: Secondary | ICD-10-CM | POA: Diagnosis not present

## 2012-06-22 DIAGNOSIS — M4310 Spondylolisthesis, site unspecified: Secondary | ICD-10-CM | POA: Diagnosis not present

## 2012-07-06 ENCOUNTER — Other Ambulatory Visit: Payer: Self-pay | Admitting: Obstetrics and Gynecology

## 2012-07-06 DIAGNOSIS — Z01419 Encounter for gynecological examination (general) (routine) without abnormal findings: Secondary | ICD-10-CM | POA: Diagnosis not present

## 2012-07-06 DIAGNOSIS — Z124 Encounter for screening for malignant neoplasm of cervix: Secondary | ICD-10-CM | POA: Diagnosis not present

## 2012-07-13 DIAGNOSIS — M4310 Spondylolisthesis, site unspecified: Secondary | ICD-10-CM | POA: Diagnosis not present

## 2012-07-13 DIAGNOSIS — M999 Biomechanical lesion, unspecified: Secondary | ICD-10-CM | POA: Diagnosis not present

## 2012-07-13 DIAGNOSIS — IMO0002 Reserved for concepts with insufficient information to code with codable children: Secondary | ICD-10-CM | POA: Diagnosis not present

## 2012-07-13 DIAGNOSIS — M5137 Other intervertebral disc degeneration, lumbosacral region: Secondary | ICD-10-CM | POA: Diagnosis not present

## 2012-07-19 DIAGNOSIS — R42 Dizziness and giddiness: Secondary | ICD-10-CM | POA: Diagnosis not present

## 2012-07-19 DIAGNOSIS — G4730 Sleep apnea, unspecified: Secondary | ICD-10-CM | POA: Diagnosis not present

## 2012-07-31 DIAGNOSIS — G4733 Obstructive sleep apnea (adult) (pediatric): Secondary | ICD-10-CM | POA: Diagnosis not present

## 2012-08-03 DIAGNOSIS — M999 Biomechanical lesion, unspecified: Secondary | ICD-10-CM | POA: Diagnosis not present

## 2012-08-03 DIAGNOSIS — IMO0002 Reserved for concepts with insufficient information to code with codable children: Secondary | ICD-10-CM | POA: Diagnosis not present

## 2012-08-03 DIAGNOSIS — M5137 Other intervertebral disc degeneration, lumbosacral region: Secondary | ICD-10-CM | POA: Diagnosis not present

## 2012-08-03 DIAGNOSIS — M4310 Spondylolisthesis, site unspecified: Secondary | ICD-10-CM | POA: Diagnosis not present

## 2012-08-31 DIAGNOSIS — M5137 Other intervertebral disc degeneration, lumbosacral region: Secondary | ICD-10-CM | POA: Diagnosis not present

## 2012-08-31 DIAGNOSIS — M4310 Spondylolisthesis, site unspecified: Secondary | ICD-10-CM | POA: Diagnosis not present

## 2012-08-31 DIAGNOSIS — IMO0002 Reserved for concepts with insufficient information to code with codable children: Secondary | ICD-10-CM | POA: Diagnosis not present

## 2012-08-31 DIAGNOSIS — M999 Biomechanical lesion, unspecified: Secondary | ICD-10-CM | POA: Diagnosis not present

## 2012-09-01 DIAGNOSIS — Z85828 Personal history of other malignant neoplasm of skin: Secondary | ICD-10-CM | POA: Diagnosis not present

## 2012-09-01 DIAGNOSIS — L821 Other seborrheic keratosis: Secondary | ICD-10-CM | POA: Diagnosis not present

## 2012-09-21 DIAGNOSIS — M5137 Other intervertebral disc degeneration, lumbosacral region: Secondary | ICD-10-CM | POA: Diagnosis not present

## 2012-09-21 DIAGNOSIS — IMO0002 Reserved for concepts with insufficient information to code with codable children: Secondary | ICD-10-CM | POA: Diagnosis not present

## 2012-09-21 DIAGNOSIS — M4310 Spondylolisthesis, site unspecified: Secondary | ICD-10-CM | POA: Diagnosis not present

## 2012-09-21 DIAGNOSIS — M999 Biomechanical lesion, unspecified: Secondary | ICD-10-CM | POA: Diagnosis not present

## 2012-10-02 ENCOUNTER — Other Ambulatory Visit: Payer: Self-pay

## 2012-10-02 DIAGNOSIS — Z1231 Encounter for screening mammogram for malignant neoplasm of breast: Secondary | ICD-10-CM

## 2012-10-11 DIAGNOSIS — IMO0002 Reserved for concepts with insufficient information to code with codable children: Secondary | ICD-10-CM | POA: Diagnosis not present

## 2012-10-11 DIAGNOSIS — M4310 Spondylolisthesis, site unspecified: Secondary | ICD-10-CM | POA: Diagnosis not present

## 2012-10-11 DIAGNOSIS — M5137 Other intervertebral disc degeneration, lumbosacral region: Secondary | ICD-10-CM | POA: Diagnosis not present

## 2012-10-11 DIAGNOSIS — M999 Biomechanical lesion, unspecified: Secondary | ICD-10-CM | POA: Diagnosis not present

## 2012-10-24 DIAGNOSIS — G4733 Obstructive sleep apnea (adult) (pediatric): Secondary | ICD-10-CM | POA: Diagnosis not present

## 2012-10-31 ENCOUNTER — Ambulatory Visit: Payer: BC Managed Care – PPO

## 2012-11-02 DIAGNOSIS — M5137 Other intervertebral disc degeneration, lumbosacral region: Secondary | ICD-10-CM | POA: Diagnosis not present

## 2012-11-02 DIAGNOSIS — M4310 Spondylolisthesis, site unspecified: Secondary | ICD-10-CM | POA: Diagnosis not present

## 2012-11-02 DIAGNOSIS — M999 Biomechanical lesion, unspecified: Secondary | ICD-10-CM | POA: Diagnosis not present

## 2012-11-02 DIAGNOSIS — IMO0002 Reserved for concepts with insufficient information to code with codable children: Secondary | ICD-10-CM | POA: Diagnosis not present

## 2012-11-08 DIAGNOSIS — H2510 Age-related nuclear cataract, unspecified eye: Secondary | ICD-10-CM | POA: Diagnosis not present

## 2012-11-14 DIAGNOSIS — J069 Acute upper respiratory infection, unspecified: Secondary | ICD-10-CM | POA: Diagnosis not present

## 2012-11-14 DIAGNOSIS — J449 Chronic obstructive pulmonary disease, unspecified: Secondary | ICD-10-CM | POA: Diagnosis not present

## 2012-11-20 ENCOUNTER — Other Ambulatory Visit: Payer: Self-pay | Admitting: *Deleted

## 2012-11-20 DIAGNOSIS — I739 Peripheral vascular disease, unspecified: Secondary | ICD-10-CM

## 2012-11-20 DIAGNOSIS — Z48812 Encounter for surgical aftercare following surgery on the circulatory system: Secondary | ICD-10-CM

## 2012-11-23 DIAGNOSIS — IMO0002 Reserved for concepts with insufficient information to code with codable children: Secondary | ICD-10-CM | POA: Diagnosis not present

## 2012-11-23 DIAGNOSIS — M999 Biomechanical lesion, unspecified: Secondary | ICD-10-CM | POA: Diagnosis not present

## 2012-11-23 DIAGNOSIS — M5137 Other intervertebral disc degeneration, lumbosacral region: Secondary | ICD-10-CM | POA: Diagnosis not present

## 2012-11-23 DIAGNOSIS — M4310 Spondylolisthesis, site unspecified: Secondary | ICD-10-CM | POA: Diagnosis not present

## 2012-11-28 ENCOUNTER — Ambulatory Visit
Admission: RE | Admit: 2012-11-28 | Discharge: 2012-11-28 | Disposition: A | Discharge status: Home / Self Care | Payer: Medicare Other | Source: Ambulatory Visit

## 2012-11-28 DIAGNOSIS — Z1231 Encounter for screening mammogram for malignant neoplasm of breast: Secondary | ICD-10-CM | POA: Diagnosis not present

## 2012-11-28 DIAGNOSIS — Z853 Personal history of malignant neoplasm of breast: Secondary | ICD-10-CM | POA: Diagnosis not present

## 2012-12-01 DIAGNOSIS — N390 Urinary tract infection, site not specified: Secondary | ICD-10-CM | POA: Diagnosis not present

## 2012-12-01 DIAGNOSIS — R300 Dysuria: Secondary | ICD-10-CM | POA: Diagnosis not present

## 2012-12-05 ENCOUNTER — Encounter: Payer: Self-pay | Admitting: Family

## 2012-12-06 ENCOUNTER — Encounter (INDEPENDENT_AMBULATORY_CARE_PROVIDER_SITE_OTHER): Payer: Medicare Other | Admitting: Vascular Surgery

## 2012-12-06 ENCOUNTER — Ambulatory Visit (INDEPENDENT_AMBULATORY_CARE_PROVIDER_SITE_OTHER): Payer: Medicare Other | Admitting: Family

## 2012-12-06 ENCOUNTER — Encounter: Payer: Self-pay | Admitting: Family

## 2012-12-06 VITALS — BP 145/86 | HR 79 | Resp 16 | Ht 62.0 in | Wt 256.0 lb

## 2012-12-06 DIAGNOSIS — I739 Peripheral vascular disease, unspecified: Secondary | ICD-10-CM | POA: Diagnosis not present

## 2012-12-06 DIAGNOSIS — Z48812 Encounter for surgical aftercare following surgery on the circulatory system: Secondary | ICD-10-CM

## 2012-12-06 DIAGNOSIS — I771 Stricture of artery: Secondary | ICD-10-CM | POA: Diagnosis not present

## 2012-12-06 NOTE — Progress Notes (Signed)
[facility] [place] HISTORY AND PHYSICAL -PAD  History of Present Illness Kristin Williams is a 67 y.o. female who had a PTA and stenting of the right Iliac artery on 04/20/2004 by Dr. Edilia Bo. Before the above interventions she had just finished chemotherapy for breast cancer and developed black right great toe and pain in that toe. She does have low back issues with DDD and arthritis and is seeing a chiropractor who is helping with sciatic pain. She states she gets the pain when she stands too long or walks a long distance but denies claudication type symptoms. Pt. denies history of stroke or mini-stroke, denies non healing ulcers on lower extremities.  Reports New Medical or Surgical History: started using C-PAP and feels refreshed after sleeping.  Pt Diabetic: No Pt smoker: smoker  (less than1 ppd x 50 yrs)  Pt meds include: Statin :Yes Betablocker: No ASA: Yes Other anticoagulants/antiplatelets: Plavix  Past Medical History  Diagnosis Date  . Arthritis   . Cancer     Cervical  . Cancer     Breast  . Cancer     Vaginal  . Hyperlipidemia   . Leg pain   . Headache(784.0)   . Peripheral vascular disease   . COPD (chronic obstructive pulmonary disease)     Social History History  Substance Use Topics  . Smoking status: Current Every Day Smoker -- 1.00 packs/day for 40 years    Types: Cigarettes  . Smokeless tobacco: Never Used  . Alcohol Use: No    Family History Family History  Problem Relation Age of Onset  . Cancer Mother   . Heart failure Father   . COPD Father   . Heart disease Brother 67    MI and Heart Disease before age 53  . Heart attack Brother     Past Surgical History  Procedure Laterality Date  . Breast lumpectomy    . Ganglion cyst excision    . Iliac artery stent  04/20/2004    CSD right iliac occlusive disease  . Abdominal hysterectomy  1975    No Known Allergies  Current Outpatient Prescriptions  Medication Sig  Dispense Refill  . albuterol (PROVENTIL) (2.5 MG/3ML) 0.083% nebulizer solution Take 2.5 mg by nebulization every 6 (six) hours as needed for wheezing.      Marland Kitchen alendronate (FOSAMAX) 35 MG tablet       . aspirin 81 MG EC tablet Take 81 mg by mouth daily.        Marland Kitchen atorvastatin (LIPITOR) 10 MG tablet Take 10 mg by mouth daily.        . Calcium Carbonate-Vitamin D (CALCIUM + D PO) Take by mouth 2 (two) times daily.        . Cholecalciferol (VITAMIN D3) 3000 UNITS TABS Take 2,000 mg by mouth daily.      Marland Kitchen CRANBERRY FRUIT PO Take 84 mg by mouth daily.      . fish oil-omega-3 fatty acids 1000 MG capsule Take 1,000 mg by mouth 2 (two) times daily.      . fluorouracil (EFUDEX) 5 % cream       . Multiple Vitamin (MULTIVITAMIN PO) Take by mouth daily.        Marland Kitchen omeprazole (PRILOSEC) 20 MG capsule       . naproxen sodium (ANAPROX) 220 MG tablet Take 220 mg by mouth 2 (two) times daily with a meal.       No current facility-administered medications for this visit.  ROS: [x]  Positive   [ ]  Denies  General:[ ]  Weight loss,  [ ]  Weight gain, [ ]  Fever, [ ]  chills Neurologic: [ ]  Dizziness, [ ]  Blackouts, [ ]  Seizure [ ]  Stroke, [ ]  "Mini stroke", [ ]  Slurred speech, [ ]  Temporary blindness;  [ ] weakness, [ ]  Hoarseness Cardiac: [ ]  Chest pain/pressure, [ ]  Shortness of breath at rest Arly.Keller ] Shortness of breath with exertion,  [ ]   Atrial fibrillation or irregular heartbeat Vascular:[ ]  Pain in legs with walking, [ ]  Pain in legs at rest ,[ ]  Pain in legs at night,  [ ]   Non-healing ulcer, [ ]  Blood clot in vein/DVT,   Pulmonary: [ ]  Home oxygen, [ ]   Productive cough, [ ]  Coughing up blood,  [ ]  Asthma,  [ ]  Wheezing Musculoskeletal:  [ ]  Arthritis, [ ]  Low back pain,  [ ]  Joint pain Hematologic:[ ]  Easy Bruising, [ ]  Anemia; [ ]  Hepatitis Gastrointestinal: [ ]  Blood in stool,  [ ]  Gastroesophageal Reflux, [ ]  Trouble swallowing Urinary: [ ]  chronic Kidney disease, [ ]  on HD, [ ]  Burning with urination,  [ ]  Frequent urination, [ ]  Difficulty urinating;  Skin: [ ]  Rashes, [ ]  Wounds     Physical Examination  Filed Vitals:   12/06/12 1612  BP: 145/86  Pulse: 79  Resp: 16   Filed Weights   12/06/12 1612  Weight: 256 lb (116.121 kg)   Body mass index is 46.81 kg/(m^2).   General: A&O x 3, WDWN, morbidly obese. Gait: normal Eyes: PERRLA, Pulmonary: CTAB, without wheezes , rales or rhonchi Cardiac: regular Rythm , without murmur          Carotid Bruits Left Right   Negative Negative     Aorta is not palpable. Radial pulses are palpable at 2+.                        VASCULAR EXAM: Extremities without ischemic changes  without Gangrene; without open wounds.                                                                                                          LE Pulses LEFT RIGHT       FEMORAL  not palpable due to large panus and body habitus  not palpable  due to large panus and body habitus        POPLITEAL  not palpable   not palpable       POSTERIOR TIBIAL   palpable    palpable        DORSALIS PEDIS      ANTERIOR TIBIAL  palpable   palpable    Abdomen: soft, NT, no masses, large panus. Skin: no rashes, ulcers noted Musculoskeletal: no muscle wasting or atrophy  Neurologic: A&O X 3; Appropriate Affect ; SENSATION: normal; MOTOR FUNCTION:  moving all extremities equally. Speech is fluent/normal. Motor strength 5/5 arms, 4/5 legs.    Non-Invasive Vascular Imaging: DATE: 12/06/2012 ABI: RIGHT 1.23, Waveforms: triphasic;  LEFT >  1.3, indicating tibial artery medical calcification, Waveforms: triphasic, biphasic  ASSESSMENT: Kristin Williams is a 67 y.o. female who presents for scheduled ABI's status post PTA and stenting of the right Iliac artery on 04/20/2004. Right ABI is normal and the left demonstrates the presence of tibial artery medial calcification which is most commonly seen in diabetics and people with end stage renal disease. She does not have chronic renal  disease according to most recent lab results on file. 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  PLAN:  I discussed in depth with the patient the nature of atherosclerosis, and emphasized the importance of maximal medical management including strict control of blood pressure, blood glucose, and lipid levels, obtaining regular exercise, and cessation of smoking.  The patient is aware that without maximal medical management the underlying atherosclerotic disease process will progress, limiting the benefit of any interventions. Based on the patient's vascular studies and examination, pt will return to clinic in 1 year. She was advised to notify this office should she have questions.  The patient was given information about PAD including signs, symptoms, treatment, what symptoms should prompt the patient to seek immediate medical care, and risk reduction measures to take. The patient was counseled re smoking cessation and given printed information re this.  Charisse March, RN, MSN, FNP-C Office Phone: [phone]  Clinic MD: Edilia Bo  12/06/2012 4:22 PM

## 2012-12-06 NOTE — Patient Instructions (Signed)
Peripheral Vascular Disease Peripheral Vascular Disease (PVD), also called Peripheral Arterial Disease (PAD), is a circulation problem caused by cholesterol (atherosclerotic plaque) deposits in the arteries. PVD commonly occurs in the lower extremities (legs) but it can occur in other areas of the body, such as your arms. The cholesterol buildup in the arteries reduces blood flow which can cause pain and other serious problems. The presence of PVD can place a person at risk for Coronary Artery Disease (CAD).  CAUSES  Causes of PVD can be many. It is usually associated with more than one risk factor such as:   High Cholesterol.  Smoking.  Diabetes.  Lack of exercise or inactivity.  High blood pressure (hypertension).  Obesity.  Family history. SYMPTOMS   When the lower extremities are affected, patients with PVD may experience:  Leg pain with exertion or physical activity. This is called INTERMITTENT CLAUDICATION. This may present as cramping or numbness with physical activity. The location of the pain is associated with the level of blockage. For example, blockage at the abdominal level (distal abdominal aorta) may result in buttock or hip pain. Lower leg arterial blockage may result in calf pain.  As PVD becomes more severe, pain can develop with less physical activity.  In people with severe PVD, leg pain may occur at rest.  Other PVD signs and symptoms:  Leg numbness or weakness.  Coldness in the affected leg or foot, especially when compared to the other leg.  A change in leg color.  Patients with significant PVD are more prone to ulcers or sores on toes, feet or legs. These may take longer to heal or may reoccur. The ulcers or sores can become infected.  If signs and symptoms of PVD are ignored, gangrene may occur. This can result in the loss of toes or loss of an entire limb.  Not all leg pain is related to PVD. Other medical conditions can cause leg pain such  as:  Blood clots (embolism) or Deep Vein Thrombosis.  Inflammation of the blood vessels (vasculitis).  Spinal stenosis. DIAGNOSIS  Diagnosis of PVD can involve several different types of tests. These can include:  Pulse Volume Recording Method (PVR). This test is simple, painless and does not involve the use of X-rays. PVR involves measuring and comparing the blood pressure in the arms and legs. An ABI (Ankle-Brachial Index) is calculated. The normal ratio of blood pressures is 1. As this number becomes smaller, it indicates more severe disease.  < 0.95  indicates significant narrowing in one or more leg vessels.  <0.8 there will usually be pain in the foot, leg or buttock with exercise.  <0.4 will usually have pain in the legs at rest.  <0.25  usually indicates limb threatening PVD.  Doppler detection of pulses in the legs. This test is painless and checks to see if you have a pulses in your legs/feet.  A dye or contrast material (a substance that highlights the blood vessels so they show up on x-ray) may be given to help your caregiver better see the arteries for the following tests. The dye is eliminated from your body by the kidney's. Your caregiver may order blood work to check your kidney function and other laboratory values before the following tests are performed:  Magnetic Resonance Angiography (MRA). An MRA is a picture study of the blood vessels and arteries. The MRA machine uses a large magnet to produce images of the blood vessels.  Computed Tomography Angiography (CTA). A CTA is a   specialized x-ray that looks at how the blood flows in your blood vessels. An IV may be inserted into your arm so contrast dye can be injected.  Angiogram. Is a procedure that uses x-rays to look at your blood vessels. This procedure is minimally invasive, meaning a small incision (cut) is made in your groin. A small tube (catheter) is then inserted into the artery of your groin. The catheter is  guided to the blood vessel or artery your caregiver wants to examine. Contrast dye is injected into the catheter. X-rays are then taken of the blood vessel or artery. After the images are obtained, the catheter is taken out. TREATMENT  Treatment of PVD involves many interventions which may include:  Lifestyle changes:  Quitting smoking.  Exercise.  Following a low fat, low cholesterol diet.  Control of diabetes.  Foot care is very important to the PVD patient. Good foot care can help prevent infection.  Medication:  Cholesterol-lowering medicine.  Blood pressure medicine.  Anti-platelet drugs.  Certain medicines may reduce symptoms of Intermittent Claudication.  Interventional/Surgical options:  Angioplasty. An Angioplasty is a procedure that inflates a balloon in the blocked artery. This opens the blocked artery to improve blood flow.  Stent Implant. A wire mesh tube (stent) is placed in the artery. The stent expands and stays in place, allowing the artery to remain open.  Peripheral Bypass Surgery. This is a surgical procedure that reroutes the blood around a blocked artery to help improve blood flow. This type of procedure may be performed if Angioplasty or stent implants are not an option. SEEK IMMEDIATE MEDICAL CARE IF:   You develop pain or numbness in your arms or legs.  Your arm or leg turns cold, becomes blue in color.  You develop redness, warmth, swelling and pain in your arms or legs. MAKE SURE YOU:   Understand these instructions.  Will watch your condition.  Will get help right away if you are not doing well or get worse. Document Released: 04/08/2004 Document Revised: 05/24/2011 Document Reviewed: 03/05/2008 Exit[employer] Patient Information 2014 [employer].   Smoking Cessation Quitting smoking is important to your health and has many advantages. However, it is not always easy to quit since nicotine is a very addictive drug. Often times, people try 3  times or more before being able to quit. This document explains the best ways for you to prepare to quit smoking. Quitting takes hard work and a lot of effort, but you can do it. ADVANTAGES OF QUITTING SMOKING  You will live longer, feel better, and live better.  Your body will feel the impact of quitting smoking almost immediately.  Within 20 minutes, blood pressure decreases. Your pulse returns to its normal level.  After 8 hours, carbon monoxide levels in the blood return to normal. Your oxygen level increases.  After 24 hours, the chance of having a heart attack starts to decrease. Your breath, hair, and body stop smelling like smoke.  After 48 hours, damaged nerve endings begin to recover. Your sense of taste and smell improve.  After 72 hours, the body is virtually free of nicotine. Your bronchial tubes relax and breathing becomes easier.  After 2 to 12 weeks, lungs can hold more air. Exercise becomes easier and circulation improves.  The risk of having a heart attack, stroke, cancer, or lung disease is greatly reduced.  After 1 year, the risk of coronary heart disease is cut in half.  After 5 years, the risk of stroke falls to   the same as a nonsmoker.  After 10 years, the risk of lung cancer is cut in half and the risk of other cancers decreases significantly.  After 15 years, the risk of coronary heart disease drops, usually to the level of a nonsmoker.  If you are pregnant, quitting smoking will improve your chances of having a healthy baby.  The people you live with, especially any children, will be healthier.  You will have extra money to spend on things other than cigarettes. QUESTIONS TO THINK ABOUT BEFORE ATTEMPTING TO QUIT You may want to talk about your answers with your caregiver.  Why do you want to quit?  If you tried to quit in the past, what helped and what did not?  What will be the most difficult situations for you after you quit? How will you plan to  handle them?  Who can help you through the tough times? Your family? Friends? A caregiver?  What pleasures do you get from smoking? What ways can you still get pleasure if you quit? Here are some questions to ask your caregiver:  How can you help me to be successful at quitting?  What medicine do you think would be best for me and how should I take it?  What should I do if I need more help?  What is smoking withdrawal like? How can I get information on withdrawal? GET READY  Set a quit date.  Change your environment by getting rid of all cigarettes, ashtrays, matches, and lighters in your home, car, or work. Do not let people smoke in your home.  Review your past attempts to quit. Think about what worked and what did not. GET SUPPORT AND ENCOURAGEMENT You have a better chance of being successful if you have help. You can get support in many ways.  Tell your family, friends, and co-workers that you are going to quit and need their support. Ask them not to smoke around you.  Get individual, group, or telephone counseling and support. Programs are available at local hospitals and health centers. Call your local health department for information about programs in your area.  Spiritual beliefs and practices may help some smokers quit.  Download a "quit meter" on your computer to keep track of quit statistics, such as how long you have gone without smoking, cigarettes not smoked, and money saved.  Get a self-help book about quitting smoking and staying off of tobacco. LEARN NEW SKILLS AND BEHAVIORS  Distract yourself from urges to smoke. Talk to someone, go for a walk, or occupy your time with a task.  Change your normal routine. Take a different route to work. Drink tea instead of coffee. Eat breakfast in a different place.  Reduce your stress. Take a hot bath, exercise, or read a book.  Plan something enjoyable to do every day. Reward yourself for not smoking.  Explore  interactive web-based programs that specialize in helping you quit. GET MEDICINE AND USE IT CORRECTLY Medicines can help you stop smoking and decrease the urge to smoke. Combining medicine with the above behavioral methods and support can greatly increase your chances of successfully quitting smoking.  Nicotine replacement therapy helps deliver nicotine to your body without the negative effects and risks of smoking. Nicotine replacement therapy includes nicotine gum, lozenges, inhalers, nasal sprays, and skin patches. Some may be available over-the-counter and others require a prescription.  Antidepressant medicine helps people abstain from smoking, but how this works is unknown. This medicine is available by prescription.    Nicotinic receptor partial agonist medicine simulates the effect of nicotine in your brain. This medicine is available by prescription. Ask your caregiver for advice about which medicines to use and how to use them based on your health history. Your caregiver will tell you what side effects to look out for if you choose to be on a medicine or therapy. Carefully read the information on the package. Do not use any other product containing nicotine while using a nicotine replacement product.  RELAPSE OR DIFFICULT SITUATIONS Most relapses occur within the first 3 months after quitting. Do not be discouraged if you start smoking again. Remember, most people try several times before finally quitting. You may have symptoms of withdrawal because your body is used to nicotine. You may crave cigarettes, be irritable, feel very hungry, cough often, get headaches, or have difficulty concentrating. The withdrawal symptoms are only temporary. They are strongest when you first quit, but they will go away within 10 14 days. To reduce the chances of relapse, try to:  Avoid drinking alcohol. Drinking lowers your chances of successfully quitting.  Reduce the amount of caffeine you consume. Once you  quit smoking, the amount of caffeine in your body increases and can give you symptoms, such as a rapid heartbeat, sweating, and anxiety.  Avoid smokers because they can make you want to smoke.  Do not let weight gain distract you. Many smokers will gain weight when they quit, usually less than 10 pounds. Eat a healthy diet and stay active. You can always lose the weight gained after you quit.  Find ways to improve your mood other than smoking. FOR MORE INFORMATION  www.smokefree.gov  Document Released: 02/23/2001 Document Revised: 08/31/2011 Document Reviewed: 06/10/2011 [employer] Patient Information 2014 [employer].  

## 2012-12-13 NOTE — Addendum Note (Signed)
Addended by: Sharee Pimple on: 12/13/2012 12:31 PM   Modules accepted: Orders

## 2012-12-14 DIAGNOSIS — M4310 Spondylolisthesis, site unspecified: Secondary | ICD-10-CM | POA: Diagnosis not present

## 2012-12-14 DIAGNOSIS — IMO0002 Reserved for concepts with insufficient information to code with codable children: Secondary | ICD-10-CM | POA: Diagnosis not present

## 2012-12-14 DIAGNOSIS — M5137 Other intervertebral disc degeneration, lumbosacral region: Secondary | ICD-10-CM | POA: Diagnosis not present

## 2012-12-14 DIAGNOSIS — M999 Biomechanical lesion, unspecified: Secondary | ICD-10-CM | POA: Diagnosis not present

## 2012-12-28 ENCOUNTER — Other Ambulatory Visit: Payer: Self-pay | Admitting: Medical Oncology

## 2013-01-03 DIAGNOSIS — M999 Biomechanical lesion, unspecified: Secondary | ICD-10-CM | POA: Diagnosis not present

## 2013-01-03 DIAGNOSIS — M4310 Spondylolisthesis, site unspecified: Secondary | ICD-10-CM | POA: Diagnosis not present

## 2013-01-03 DIAGNOSIS — M5137 Other intervertebral disc degeneration, lumbosacral region: Secondary | ICD-10-CM | POA: Diagnosis not present

## 2013-01-03 DIAGNOSIS — IMO0002 Reserved for concepts with insufficient information to code with codable children: Secondary | ICD-10-CM | POA: Diagnosis not present

## 2013-01-15 DIAGNOSIS — H25019 Cortical age-related cataract, unspecified eye: Secondary | ICD-10-CM | POA: Diagnosis not present

## 2013-01-15 DIAGNOSIS — H18419 Arcus senilis, unspecified eye: Secondary | ICD-10-CM | POA: Diagnosis not present

## 2013-01-15 DIAGNOSIS — H02839 Dermatochalasis of unspecified eye, unspecified eyelid: Secondary | ICD-10-CM | POA: Diagnosis not present

## 2013-01-15 DIAGNOSIS — H2510 Age-related nuclear cataract, unspecified eye: Secondary | ICD-10-CM | POA: Diagnosis not present

## 2013-01-20 DIAGNOSIS — Z23 Encounter for immunization: Secondary | ICD-10-CM | POA: Diagnosis not present

## 2013-01-25 DIAGNOSIS — M5137 Other intervertebral disc degeneration, lumbosacral region: Secondary | ICD-10-CM | POA: Diagnosis not present

## 2013-01-25 DIAGNOSIS — M999 Biomechanical lesion, unspecified: Secondary | ICD-10-CM | POA: Diagnosis not present

## 2013-01-25 DIAGNOSIS — M4310 Spondylolisthesis, site unspecified: Secondary | ICD-10-CM | POA: Diagnosis not present

## 2013-01-25 DIAGNOSIS — IMO0002 Reserved for concepts with insufficient information to code with codable children: Secondary | ICD-10-CM | POA: Diagnosis not present

## 2013-02-15 DIAGNOSIS — M5137 Other intervertebral disc degeneration, lumbosacral region: Secondary | ICD-10-CM | POA: Diagnosis not present

## 2013-02-15 DIAGNOSIS — M999 Biomechanical lesion, unspecified: Secondary | ICD-10-CM | POA: Diagnosis not present

## 2013-02-15 DIAGNOSIS — IMO0002 Reserved for concepts with insufficient information to code with codable children: Secondary | ICD-10-CM | POA: Diagnosis not present

## 2013-02-15 DIAGNOSIS — M4310 Spondylolisthesis, site unspecified: Secondary | ICD-10-CM | POA: Diagnosis not present

## 2013-03-05 DIAGNOSIS — M4310 Spondylolisthesis, site unspecified: Secondary | ICD-10-CM | POA: Diagnosis not present

## 2013-03-05 DIAGNOSIS — IMO0002 Reserved for concepts with insufficient information to code with codable children: Secondary | ICD-10-CM | POA: Diagnosis not present

## 2013-03-05 DIAGNOSIS — M5137 Other intervertebral disc degeneration, lumbosacral region: Secondary | ICD-10-CM | POA: Diagnosis not present

## 2013-03-05 DIAGNOSIS — M999 Biomechanical lesion, unspecified: Secondary | ICD-10-CM | POA: Diagnosis not present

## 2013-03-12 DIAGNOSIS — H2510 Age-related nuclear cataract, unspecified eye: Secondary | ICD-10-CM | POA: Diagnosis not present

## 2013-03-12 DIAGNOSIS — H269 Unspecified cataract: Secondary | ICD-10-CM | POA: Diagnosis not present

## 2013-03-13 DIAGNOSIS — H2510 Age-related nuclear cataract, unspecified eye: Secondary | ICD-10-CM | POA: Diagnosis not present

## 2013-03-28 DIAGNOSIS — IMO0002 Reserved for concepts with insufficient information to code with codable children: Secondary | ICD-10-CM | POA: Diagnosis not present

## 2013-03-28 DIAGNOSIS — M999 Biomechanical lesion, unspecified: Secondary | ICD-10-CM | POA: Diagnosis not present

## 2013-03-28 DIAGNOSIS — M4310 Spondylolisthesis, site unspecified: Secondary | ICD-10-CM | POA: Diagnosis not present

## 2013-03-28 DIAGNOSIS — M5137 Other intervertebral disc degeneration, lumbosacral region: Secondary | ICD-10-CM | POA: Diagnosis not present

## 2013-03-30 DIAGNOSIS — M949 Disorder of cartilage, unspecified: Secondary | ICD-10-CM | POA: Diagnosis not present

## 2013-03-30 DIAGNOSIS — E7800 Pure hypercholesterolemia, unspecified: Secondary | ICD-10-CM | POA: Diagnosis not present

## 2013-03-30 DIAGNOSIS — K219 Gastro-esophageal reflux disease without esophagitis: Secondary | ICD-10-CM | POA: Diagnosis not present

## 2013-03-30 DIAGNOSIS — Z0000 Encounter for general adult medical examination without abnormal findings: Secondary | ICD-10-CM | POA: Diagnosis not present

## 2013-03-30 DIAGNOSIS — M899 Disorder of bone, unspecified: Secondary | ICD-10-CM | POA: Diagnosis not present

## 2013-03-30 DIAGNOSIS — I739 Peripheral vascular disease, unspecified: Secondary | ICD-10-CM | POA: Diagnosis not present

## 2013-03-30 DIAGNOSIS — Z23 Encounter for immunization: Secondary | ICD-10-CM | POA: Diagnosis not present

## 2013-03-30 DIAGNOSIS — J310 Chronic rhinitis: Secondary | ICD-10-CM | POA: Diagnosis not present

## 2013-04-02 DIAGNOSIS — H2510 Age-related nuclear cataract, unspecified eye: Secondary | ICD-10-CM | POA: Diagnosis not present

## 2013-04-02 DIAGNOSIS — H269 Unspecified cataract: Secondary | ICD-10-CM | POA: Diagnosis not present

## 2013-04-19 DIAGNOSIS — IMO0002 Reserved for concepts with insufficient information to code with codable children: Secondary | ICD-10-CM | POA: Diagnosis not present

## 2013-04-19 DIAGNOSIS — M4310 Spondylolisthesis, site unspecified: Secondary | ICD-10-CM | POA: Diagnosis not present

## 2013-04-19 DIAGNOSIS — M999 Biomechanical lesion, unspecified: Secondary | ICD-10-CM | POA: Diagnosis not present

## 2013-04-19 DIAGNOSIS — M5137 Other intervertebral disc degeneration, lumbosacral region: Secondary | ICD-10-CM | POA: Diagnosis not present

## 2013-05-17 DIAGNOSIS — M4310 Spondylolisthesis, site unspecified: Secondary | ICD-10-CM | POA: Diagnosis not present

## 2013-05-17 DIAGNOSIS — M999 Biomechanical lesion, unspecified: Secondary | ICD-10-CM | POA: Diagnosis not present

## 2013-05-17 DIAGNOSIS — M5137 Other intervertebral disc degeneration, lumbosacral region: Secondary | ICD-10-CM | POA: Diagnosis not present

## 2013-05-17 DIAGNOSIS — IMO0002 Reserved for concepts with insufficient information to code with codable children: Secondary | ICD-10-CM | POA: Diagnosis not present

## 2013-06-05 DIAGNOSIS — M545 Low back pain, unspecified: Secondary | ICD-10-CM | POA: Diagnosis not present

## 2013-06-05 DIAGNOSIS — Z6841 Body Mass Index (BMI) 40.0 and over, adult: Secondary | ICD-10-CM | POA: Diagnosis not present

## 2013-06-05 DIAGNOSIS — M4310 Spondylolisthesis, site unspecified: Secondary | ICD-10-CM | POA: Diagnosis not present

## 2013-06-05 DIAGNOSIS — M48062 Spinal stenosis, lumbar region with neurogenic claudication: Secondary | ICD-10-CM | POA: Diagnosis not present

## 2013-06-07 DIAGNOSIS — H1030 Unspecified acute conjunctivitis, unspecified eye: Secondary | ICD-10-CM | POA: Diagnosis not present

## 2013-06-07 DIAGNOSIS — H1045 Other chronic allergic conjunctivitis: Secondary | ICD-10-CM | POA: Diagnosis not present

## 2013-06-14 DIAGNOSIS — M999 Biomechanical lesion, unspecified: Secondary | ICD-10-CM | POA: Diagnosis not present

## 2013-06-14 DIAGNOSIS — M5137 Other intervertebral disc degeneration, lumbosacral region: Secondary | ICD-10-CM | POA: Diagnosis not present

## 2013-06-14 DIAGNOSIS — M4310 Spondylolisthesis, site unspecified: Secondary | ICD-10-CM | POA: Diagnosis not present

## 2013-06-14 DIAGNOSIS — IMO0002 Reserved for concepts with insufficient information to code with codable children: Secondary | ICD-10-CM | POA: Diagnosis not present

## 2013-07-05 DIAGNOSIS — M5137 Other intervertebral disc degeneration, lumbosacral region: Secondary | ICD-10-CM | POA: Diagnosis not present

## 2013-07-05 DIAGNOSIS — M4310 Spondylolisthesis, site unspecified: Secondary | ICD-10-CM | POA: Diagnosis not present

## 2013-07-05 DIAGNOSIS — IMO0002 Reserved for concepts with insufficient information to code with codable children: Secondary | ICD-10-CM | POA: Diagnosis not present

## 2013-07-05 DIAGNOSIS — M999 Biomechanical lesion, unspecified: Secondary | ICD-10-CM | POA: Diagnosis not present

## 2013-07-16 DIAGNOSIS — Z124 Encounter for screening for malignant neoplasm of cervix: Secondary | ICD-10-CM | POA: Diagnosis not present

## 2013-07-26 DIAGNOSIS — M5137 Other intervertebral disc degeneration, lumbosacral region: Secondary | ICD-10-CM | POA: Diagnosis not present

## 2013-07-26 DIAGNOSIS — M999 Biomechanical lesion, unspecified: Secondary | ICD-10-CM | POA: Diagnosis not present

## 2013-07-26 DIAGNOSIS — M4310 Spondylolisthesis, site unspecified: Secondary | ICD-10-CM | POA: Diagnosis not present

## 2013-07-26 DIAGNOSIS — IMO0002 Reserved for concepts with insufficient information to code with codable children: Secondary | ICD-10-CM | POA: Diagnosis not present

## 2013-08-17 DIAGNOSIS — M4310 Spondylolisthesis, site unspecified: Secondary | ICD-10-CM | POA: Diagnosis not present

## 2013-08-17 DIAGNOSIS — M999 Biomechanical lesion, unspecified: Secondary | ICD-10-CM | POA: Diagnosis not present

## 2013-08-17 DIAGNOSIS — IMO0002 Reserved for concepts with insufficient information to code with codable children: Secondary | ICD-10-CM | POA: Diagnosis not present

## 2013-08-17 DIAGNOSIS — M5137 Other intervertebral disc degeneration, lumbosacral region: Secondary | ICD-10-CM | POA: Diagnosis not present

## 2013-09-05 DIAGNOSIS — H43819 Vitreous degeneration, unspecified eye: Secondary | ICD-10-CM | POA: Diagnosis not present

## 2013-09-05 DIAGNOSIS — H35379 Puckering of macula, unspecified eye: Secondary | ICD-10-CM | POA: Diagnosis not present

## 2013-09-06 DIAGNOSIS — M999 Biomechanical lesion, unspecified: Secondary | ICD-10-CM | POA: Diagnosis not present

## 2013-09-06 DIAGNOSIS — M4310 Spondylolisthesis, site unspecified: Secondary | ICD-10-CM | POA: Diagnosis not present

## 2013-09-06 DIAGNOSIS — M5137 Other intervertebral disc degeneration, lumbosacral region: Secondary | ICD-10-CM | POA: Diagnosis not present

## 2013-09-06 DIAGNOSIS — IMO0002 Reserved for concepts with insufficient information to code with codable children: Secondary | ICD-10-CM | POA: Diagnosis not present

## 2013-09-20 DIAGNOSIS — IMO0002 Reserved for concepts with insufficient information to code with codable children: Secondary | ICD-10-CM | POA: Diagnosis not present

## 2013-09-20 DIAGNOSIS — M999 Biomechanical lesion, unspecified: Secondary | ICD-10-CM | POA: Diagnosis not present

## 2013-09-20 DIAGNOSIS — M4310 Spondylolisthesis, site unspecified: Secondary | ICD-10-CM | POA: Diagnosis not present

## 2013-09-20 DIAGNOSIS — M5137 Other intervertebral disc degeneration, lumbosacral region: Secondary | ICD-10-CM | POA: Diagnosis not present

## 2013-10-04 DIAGNOSIS — R7301 Impaired fasting glucose: Secondary | ICD-10-CM | POA: Diagnosis not present

## 2013-10-10 DIAGNOSIS — M999 Biomechanical lesion, unspecified: Secondary | ICD-10-CM | POA: Diagnosis not present

## 2013-10-10 DIAGNOSIS — M4310 Spondylolisthesis, site unspecified: Secondary | ICD-10-CM | POA: Diagnosis not present

## 2013-10-10 DIAGNOSIS — M5137 Other intervertebral disc degeneration, lumbosacral region: Secondary | ICD-10-CM | POA: Diagnosis not present

## 2013-10-10 DIAGNOSIS — R7309 Other abnormal glucose: Secondary | ICD-10-CM | POA: Diagnosis not present

## 2013-10-10 DIAGNOSIS — IMO0002 Reserved for concepts with insufficient information to code with codable children: Secondary | ICD-10-CM | POA: Diagnosis not present

## 2013-10-29 ENCOUNTER — Other Ambulatory Visit: Payer: Self-pay

## 2013-10-29 DIAGNOSIS — Z1231 Encounter for screening mammogram for malignant neoplasm of breast: Secondary | ICD-10-CM

## 2013-10-31 DIAGNOSIS — G4733 Obstructive sleep apnea (adult) (pediatric): Secondary | ICD-10-CM | POA: Diagnosis not present

## 2013-11-01 DIAGNOSIS — H35379 Puckering of macula, unspecified eye: Secondary | ICD-10-CM | POA: Diagnosis not present

## 2013-11-01 DIAGNOSIS — M999 Biomechanical lesion, unspecified: Secondary | ICD-10-CM | POA: Diagnosis not present

## 2013-11-01 DIAGNOSIS — M5137 Other intervertebral disc degeneration, lumbosacral region: Secondary | ICD-10-CM | POA: Diagnosis not present

## 2013-11-01 DIAGNOSIS — IMO0002 Reserved for concepts with insufficient information to code with codable children: Secondary | ICD-10-CM | POA: Diagnosis not present

## 2013-11-01 DIAGNOSIS — M4310 Spondylolisthesis, site unspecified: Secondary | ICD-10-CM | POA: Diagnosis not present

## 2013-11-22 DIAGNOSIS — M999 Biomechanical lesion, unspecified: Secondary | ICD-10-CM | POA: Diagnosis not present

## 2013-11-22 DIAGNOSIS — M4310 Spondylolisthesis, site unspecified: Secondary | ICD-10-CM | POA: Diagnosis not present

## 2013-11-22 DIAGNOSIS — M5137 Other intervertebral disc degeneration, lumbosacral region: Secondary | ICD-10-CM | POA: Diagnosis not present

## 2013-11-22 DIAGNOSIS — IMO0002 Reserved for concepts with insufficient information to code with codable children: Secondary | ICD-10-CM | POA: Diagnosis not present

## 2013-11-29 ENCOUNTER — Ambulatory Visit
Admission: RE | Admit: 2013-11-29 | Discharge: 2013-11-29 | Disposition: A | Discharge status: Home / Self Care | Payer: Medicare Other | Source: Ambulatory Visit

## 2013-11-29 DIAGNOSIS — Z1231 Encounter for screening mammogram for malignant neoplasm of breast: Secondary | ICD-10-CM

## 2013-12-11 ENCOUNTER — Encounter: Payer: Self-pay | Admitting: Family

## 2013-12-12 ENCOUNTER — Ambulatory Visit (HOSPITAL_COMMUNITY)
Admission: RE | Admit: 2013-12-12 | Discharge: 2013-12-12 | Disposition: A | Discharge status: Home / Self Care | Payer: Medicare Other | Source: Ambulatory Visit | Attending: Family | Admitting: Family

## 2013-12-12 ENCOUNTER — Encounter: Payer: Self-pay | Admitting: Family

## 2013-12-12 ENCOUNTER — Ambulatory Visit (INDEPENDENT_AMBULATORY_CARE_PROVIDER_SITE_OTHER): Payer: Medicare Other | Admitting: Family

## 2013-12-12 VITALS — BP 125/71 | HR 68 | Resp 16 | Ht 62.0 in | Wt 243.0 lb

## 2013-12-12 DIAGNOSIS — M5137 Other intervertebral disc degeneration, lumbosacral region: Secondary | ICD-10-CM | POA: Diagnosis not present

## 2013-12-12 DIAGNOSIS — Z48812 Encounter for surgical aftercare following surgery on the circulatory system: Secondary | ICD-10-CM

## 2013-12-12 DIAGNOSIS — I771 Stricture of artery: Secondary | ICD-10-CM | POA: Diagnosis not present

## 2013-12-12 DIAGNOSIS — M51379 Other intervertebral disc degeneration, lumbosacral region without mention of lumbar back pain or lower extremity pain: Secondary | ICD-10-CM | POA: Insufficient documentation

## 2013-12-12 DIAGNOSIS — Z9861 Coronary angioplasty status: Secondary | ICD-10-CM | POA: Insufficient documentation

## 2013-12-12 DIAGNOSIS — M5430 Sciatica, unspecified side: Secondary | ICD-10-CM | POA: Diagnosis not present

## 2013-12-12 DIAGNOSIS — F17200 Nicotine dependence, unspecified, uncomplicated: Secondary | ICD-10-CM | POA: Insufficient documentation

## 2013-12-12 DIAGNOSIS — Z7982 Long term (current) use of aspirin: Secondary | ICD-10-CM | POA: Diagnosis not present

## 2013-12-12 NOTE — Patient Instructions (Addendum)
Peripheral Vascular Disease Peripheral Vascular Disease (PVD), also called Peripheral Arterial Disease (PAD), is a circulation problem caused by cholesterol (atherosclerotic plaque) deposits in the arteries. PVD commonly occurs in the lower extremities (legs) but it can occur in other areas of the body, such as your arms. The cholesterol buildup in the arteries reduces blood flow which can cause pain and other serious problems. The presence of PVD can place a person at risk for Coronary Artery Disease (CAD).  CAUSES  Causes of PVD can be many. It is usually associated with more than one risk factor such as:   High Cholesterol.  Smoking.  Diabetes.  Lack of exercise or inactivity.  High blood pressure (hypertension).  Obesity.  Family history. SYMPTOMS   When the lower extremities are affected, patients with PVD may experience:  Leg pain with exertion or physical activity. This is called INTERMITTENT CLAUDICATION. This may present as cramping or numbness with physical activity. The location of the pain is associated with the level of blockage. For example, blockage at the abdominal level (distal abdominal aorta) may result in buttock or hip pain. Lower leg arterial blockage may result in calf pain.  As PVD becomes more severe, pain can develop with less physical activity.  In people with severe PVD, leg pain may occur at rest.  Other PVD signs and symptoms:  Leg numbness or weakness.  Coldness in the affected leg or foot, especially when compared to the other leg.  A change in leg color.  Patients with significant PVD are more prone to ulcers or sores on toes, feet or legs. These may take longer to heal or may reoccur. The ulcers or sores can become infected.  If signs and symptoms of PVD are ignored, gangrene may occur. This can result in the loss of toes or loss of an entire limb.  Not all leg pain is related to PVD. Other medical conditions can cause leg pain such  as:  Blood clots (embolism) or Deep Vein Thrombosis.  Inflammation of the blood vessels (vasculitis).  Spinal stenosis. DIAGNOSIS  Diagnosis of PVD can involve several different types of tests. These can include:  Pulse Volume Recording Method (PVR). This test is simple, painless and does not involve the use of X-rays. PVR involves measuring and comparing the blood pressure in the arms and legs. An ABI (Ankle-Brachial Index) is calculated. The normal ratio of blood pressures is 1. As this number becomes smaller, it indicates more severe disease.  < 0.95 - indicates significant narrowing in one or more leg vessels.  <0.8 - there will usually be pain in the foot, leg or buttock with exercise.  <0.4 - will usually have pain in the legs at rest.  <0.25 - usually indicates limb threatening PVD.  Doppler detection of pulses in the legs. This test is painless and checks to see if you have a pulses in your legs/feet.  A dye or contrast material (a substance that highlights the blood vessels so they show up on x-ray) may be given to help your caregiver better see the arteries for the following tests. The dye is eliminated from your body by the kidney's. Your caregiver may order blood work to check your kidney function and other laboratory values before the following tests are performed:  Magnetic Resonance Angiography (MRA). An MRA is a picture study of the blood vessels and arteries. The MRA machine uses a large magnet to produce images of the blood vessels.  Computed Tomography Angiography (CTA). A CTA   is a specialized x-ray that looks at how the blood flows in your blood vessels. An IV may be inserted into your arm so contrast dye can be injected.  Angiogram. Is a procedure that uses x-rays to look at your blood vessels. This procedure is minimally invasive, meaning a small incision (cut) is made in your groin. A small tube (catheter) is then inserted into the artery of your groin. The catheter  is guided to the blood vessel or artery your caregiver wants to examine. Contrast dye is injected into the catheter. X-rays are then taken of the blood vessel or artery. After the images are obtained, the catheter is taken out. TREATMENT  Treatment of PVD involves many interventions which may include:  Lifestyle changes:  Quitting smoking.  Exercise.  Following a low fat, low cholesterol diet.  Control of diabetes.  Foot care is very important to the PVD patient. Good foot care can help prevent infection.  Medication:  Cholesterol-lowering medicine.  Blood pressure medicine.  Anti-platelet drugs.  Certain medicines may reduce symptoms of Intermittent Claudication.  Interventional/Surgical options:  Angioplasty. An Angioplasty is a procedure that inflates a balloon in the blocked artery. This opens the blocked artery to improve blood flow.  Stent Implant. A wire mesh tube (stent) is placed in the artery. The stent expands and stays in place, allowing the artery to remain open.  Peripheral Bypass Surgery. This is a surgical procedure that reroutes the blood around a blocked artery to help improve blood flow. This type of procedure may be performed if Angioplasty or stent implants are not an option. SEEK IMMEDIATE MEDICAL CARE IF:   You develop pain or numbness in your arms or legs.  Your arm or leg turns cold, becomes blue in color.  You develop redness, warmth, swelling and pain in your arms or legs. MAKE SURE YOU:   Understand these instructions.  Will watch your condition.  Will get help right away if you are not doing well or get worse. Document Released: 04/08/2004 Document Revised: 05/24/2011 Document Reviewed: 03/05/2008 ExitCare Patient Information 2015 [employer]. This information is not intended to replace advice given to you by your health care provider. Make sure you discuss any questions you have with your health care provider.   Smoking  Cessation Quitting smoking is important to your health and has many advantages. However, it is not always easy to quit since nicotine is a very addictive drug. Oftentimes, people try 3 times or more before being able to quit. This document explains the best ways for you to prepare to quit smoking. Quitting takes hard work and a lot of effort, but you can do it. ADVANTAGES OF QUITTING SMOKING  You will live longer, feel better, and live better.  Your body will feel the impact of quitting smoking almost immediately.  Within 20 minutes, blood pressure decreases. Your pulse returns to its normal level.  After 8 hours, carbon monoxide levels in the blood return to normal. Your oxygen level increases.  After 24 hours, the chance of having a heart attack starts to decrease. Your breath, hair, and body stop smelling like smoke.  After 48 hours, damaged nerve endings begin to recover. Your sense of taste and smell improve.  After 72 hours, the body is virtually free of nicotine. Your bronchial tubes relax and breathing becomes easier.  After 2 to 12 weeks, lungs can hold more air. Exercise becomes easier and circulation improves.  The risk of having a heart attack, stroke, cancer,   or lung disease is greatly reduced.  After 1 year, the risk of coronary heart disease is cut in half.  After 5 years, the risk of stroke falls to the same as a nonsmoker.  After 10 years, the risk of lung cancer is cut in half and the risk of other cancers decreases significantly.  After 15 years, the risk of coronary heart disease drops, usually to the level of a nonsmoker.  If you are pregnant, quitting smoking will improve your chances of having a healthy baby.  The people you live with, especially any children, will be healthier.  You will have extra money to spend on things other than cigarettes. QUESTIONS TO THINK ABOUT BEFORE ATTEMPTING TO QUIT You may want to talk about your answers with your health care  provider.  Why do you want to quit?  If you tried to quit in the past, what helped and what did not?  What will be the most difficult situations for you after you quit? How will you plan to handle them?  Who can help you through the tough times? Your family? Friends? A health care provider?  What pleasures do you get from smoking? What ways can you still get pleasure if you quit? Here are some questions to ask your health care provider:  How can you help me to be successful at quitting?  What medicine do you think would be best for me and how should I take it?  What should I do if I need more help?  What is smoking withdrawal like? How can I get information on withdrawal? GET READY  Set a quit date.  Change your environment by getting rid of all cigarettes, ashtrays, matches, and lighters in your home, car, or work. Do not let people smoke in your home.  Review your past attempts to quit. Think about what worked and what did not. GET SUPPORT AND ENCOURAGEMENT You have a better chance of being successful if you have help. You can get support in many ways.  Tell your family, friends, and coworkers that you are going to quit and need their support. Ask them not to smoke around you.  Get individual, group, or telephone counseling and support. Programs are available at local hospitals and health centers. Call your local health department for information about programs in your area.  Spiritual beliefs and practices may help some smokers quit.  Download a "quit meter" on your computer to keep track of quit statistics, such as how long you have gone without smoking, cigarettes not smoked, and money saved.  Get a self-help book about quitting smoking and staying off tobacco. LEARN NEW SKILLS AND BEHAVIORS  Distract yourself from urges to smoke. Talk to someone, go for a walk, or occupy your time with a task.  Change your normal routine. Take a different route to work. Drink tea  instead of coffee. Eat breakfast in a different place.  Reduce your stress. Take a hot bath, exercise, or read a book.  Plan something enjoyable to do every day. Reward yourself for not smoking.  Explore interactive web-based programs that specialize in helping you quit. GET MEDICINE AND USE IT CORRECTLY Medicines can help you stop smoking and decrease the urge to smoke. Combining medicine with the above behavioral methods and support can greatly increase your chances of successfully quitting smoking.  Nicotine replacement therapy helps deliver nicotine to your body without the negative effects and risks of smoking. Nicotine replacement therapy includes nicotine gum, lozenges, inhalers,   nasal sprays, and skin patches. Some may be available over-the-counter and others require a prescription.  Antidepressant medicine helps people abstain from smoking, but how this works is unknown. This medicine is available by prescription.  Nicotinic receptor partial agonist medicine simulates the effect of nicotine in your brain. This medicine is available by prescription. Ask your health care provider for advice about which medicines to use and how to use them based on your health history. Your health care provider will tell you what side effects to look out for if you choose to be on a medicine or therapy. Carefully read the information on the package. Do not use any other product containing nicotine while using a nicotine replacement product.  RELAPSE OR DIFFICULT SITUATIONS Most relapses occur within the first 3 months after quitting. Do not be discouraged if you start smoking again. Remember, most people try several times before finally quitting. You may have symptoms of withdrawal because your body is used to nicotine. You may crave cigarettes, be irritable, feel very hungry, cough often, get headaches, or have difficulty concentrating. The withdrawal symptoms are only temporary. They are strongest when you  first quit, but they will go away within 10-14 days. To reduce the chances of relapse, try to:  Avoid drinking alcohol. Drinking lowers your chances of successfully quitting.  Reduce the amount of caffeine you consume. Once you quit smoking, the amount of caffeine in your body increases and can give you symptoms, such as a rapid heartbeat, sweating, and anxiety.  Avoid smokers because they can make you want to smoke.  Do not let weight gain distract you. Many smokers will gain weight when they quit, usually less than 10 pounds. Eat a healthy diet and stay active. You can always lose the weight gained after you quit.  Find ways to improve your mood other than smoking. FOR MORE INFORMATION  www.smokefree.gov  Document Released: 02/23/2001 Document Revised: 07/16/2013 Document Reviewed: 06/10/2011 ExitCare Patient Information 2015 [employer]. This information is not intended to replace advice given to you by your health care provider. Make sure you discuss any questions you have with your health care provider.  

## 2013-12-12 NOTE — Progress Notes (Signed)
[facility] HISTORY AND PHYSICAL -PAD  History of Present Illness Kristin Williams is a 68 y.o. female who had a PTA and stenting of the right Iliac artery on 04/20/2004 by Dr. Scot Dock.  Before the above interventions she had just finished chemotherapy for breast cancer and developed black right great toe and pain in that toe. She does have low back issues with DDD and arthritis and is seeing a chiropractor who is helping with sciatic pain. She states she gets the pain when she stands too long or walks a long distance but denies claudication type symptoms.  She is exercising daily with stationary bike, states she feels great with exercising, her back feels better also. Pt. denies history of stroke or mini-stroke, denies non healing ulcers on lower extremities.  Reports New Medical or Surgical History: started using C-PAP and feels refreshed after sleeping.   Pt Diabetic: borderline AIC per pt  Pt smoker: smoker (less than1 ppd x 50 yrs)  Pt meds include:  Statin :Yes  Betablocker: No  ASA: Yes  Other anticoagulants/antiplatelets: no     Past Medical History  Diagnosis Date  . Arthritis   . Cancer     Cervical  . Cancer     Breast  . Cancer     Vaginal  . Hyperlipidemia   . Leg pain   . Headache(784.0)   . Peripheral vascular disease   . COPD (chronic obstructive pulmonary disease)     Social History History  Substance Use Topics  . Smoking status: Light Tobacco Smoker -- 1.00 packs/day for 40 years    Types: Cigarettes, E-cigarettes  . Smokeless tobacco: Never Used  . Alcohol Use: No    Family History Family History  Problem Relation Age of Onset  . Cancer Mother     Non-Hodgkins Disease T-Cell  . Heart failure Father   . COPD Father   . Heart disease Brother 77    MI and Heart Disease before age 39  . Heart attack Brother     Past Surgical History  Procedure Laterality Date  . Breast lumpectomy  2005  . Ganglion cyst excision   1977  . Iliac artery stent  04/20/2004    CSD right iliac occlusive disease  . Abdominal hysterectomy  1975    No Known Allergies  Current Outpatient Prescriptions  Medication Sig Dispense Refill  . albuterol (PROVENTIL HFA;VENTOLIN HFA) 108 (90 BASE) MCG/ACT inhaler Inhale 2 puffs into the lungs as needed for wheezing or shortness of breath.      Marland Kitchen albuterol (PROVENTIL) (2.5 MG/3ML) 0.083% nebulizer solution Take 2.5 mg by nebulization every 6 (six) hours as needed for wheezing.      Marland Kitchen alendronate (FOSAMAX) 35 MG tablet Take 35 mg by mouth every 7 (seven) days.       Marland Kitchen aspirin 81 MG EC tablet Take 81 mg by mouth daily.        Marland Kitchen atorvastatin (LIPITOR) 10 MG tablet Take 10 mg by mouth daily.        . Calcium Carbonate-Vitamin D (CALCIUM + D PO) Take by mouth 2 (two) times daily.        . Cholecalciferol (VITAMIN D3) 3000 UNITS TABS Take 2,000 mg by mouth daily.      Marland Kitchen CRANBERRY FRUIT PO Take 84 mg by mouth daily.      . fish oil-omega-3 fatty acids 1000 MG capsule Take 1,000 mg by mouth 2 (two) times daily.      Marland Kitchen  fluorouracil (EFUDEX) 5 % cream       . Multiple Vitamin (MULTIVITAMIN PO) Take by mouth daily.        Marland Kitchen omeprazole (PRILOSEC) 20 MG capsule Take 20 mg by mouth daily.       . naproxen sodium (ANAPROX) 220 MG tablet Take 220 mg by mouth 2 (two) times daily with a meal.       No current facility-administered medications for this visit.    ROS: See HPI for pertinent positives and negatives.   Physical Examination  Filed Vitals:   12/12/13 1014  BP: 125/71  Pulse: 68  Resp: 16  Height: 5\' 2"  (1.575 m)  Weight: 243 lb (110.224 kg)  SpO2: 99%   Body mass index is 44.43 kg/(m^2).  General: A&O x 3, WDWN, morbidly obese female.  Gait: normal  Eyes: Pupils equal Pulmonary: CTAB, without wheezes , rales or rhonchi  Cardiac: regular Rythm , without murmur detected  Carotid Bruits  Left  Right    Negative  Negative    Aorta is not palpable.  Radial pulses are  palpable at 2+.   VASCULAR EXAM:  Extremities without ischemic changes  without Gangrene; without open wounds.  LE Pulses  LEFT  RIGHT   FEMORAL  not palpable due to large panus and body habitus  not palpable due to large panus and body habitus   POPLITEAL  not palpable  not palpable   POSTERIOR TIBIAL  palpable  palpable   DORSALIS PEDIS  ANTERIOR TIBIAL  palpable  palpable   Abdomen: soft, NT, no masses, large panus.  Skin: no rashes, ulcers noted  Musculoskeletal: no muscle wasting or atrophy  Neurologic: A&O X 3; Appropriate Affect ; SENSATION: normal; MOTOR FUNCTION: moving all extremities equally. Speech is fluent/normal. Motor strength 5/5 throughout.    Non-Invasive Vascular Imaging: DATE: 12/12/2013 ABI: RIGHT: tibial artery medical calcification, Waveforms: triphasic;  LEFT: tibial artery medical calcification, Waveforms: triphasic   ASSESSMENT: Kristin Williams is a 68 y.o. female who is s/p PTA and stenting of the right Iliac artery on 04/20/2004. She denies claudication symptoms. ABI waveforms are all triphasic, she has embraced exercise, feels very well, and is trying to lose weight.   PLAN:  She was counseled re smoking cessation. I discussed in depth with the patient the nature of atherosclerosis, and emphasized the importance of maximal medical management including strict control of blood pressure, blood glucose, and lipid levels, obtaining regular exercise, and cessation of smoking.  The patient is aware that without maximal medical management the underlying atherosclerotic disease process will progress, limiting the benefit of any interventions.  Based on the patient's vascular studies and examination, pt will return to clinic in 1 year for ABI's and right aortoiliac arterial Duplex.  The patient was given information about PAD including signs, symptoms, treatment, what symptoms should prompt the patient to seek immediate medical care, and risk reduction measures to  take.  Clemon Chambers, RN, MSN, FNP-C [facility] Phone: [phone]  Clinic MD: Scot Dock  12/12/2013 10:29 AM

## 2013-12-13 DIAGNOSIS — M9904 Segmental and somatic dysfunction of sacral region: Secondary | ICD-10-CM | POA: Diagnosis not present

## 2013-12-13 DIAGNOSIS — M9902 Segmental and somatic dysfunction of thoracic region: Secondary | ICD-10-CM | POA: Diagnosis not present

## 2013-12-13 DIAGNOSIS — M5136 Other intervertebral disc degeneration, lumbar region: Secondary | ICD-10-CM | POA: Diagnosis not present

## 2013-12-13 DIAGNOSIS — M5416 Radiculopathy, lumbar region: Secondary | ICD-10-CM | POA: Diagnosis not present

## 2013-12-13 DIAGNOSIS — M9903 Segmental and somatic dysfunction of lumbar region: Secondary | ICD-10-CM | POA: Diagnosis not present

## 2013-12-13 DIAGNOSIS — M4300 Spondylolysis, site unspecified: Secondary | ICD-10-CM | POA: Diagnosis not present

## 2013-12-22 DIAGNOSIS — J9801 Acute bronchospasm: Secondary | ICD-10-CM | POA: Diagnosis not present

## 2013-12-22 DIAGNOSIS — J180 Bronchopneumonia, unspecified organism: Secondary | ICD-10-CM | POA: Diagnosis not present

## 2014-01-10 DIAGNOSIS — M9902 Segmental and somatic dysfunction of thoracic region: Secondary | ICD-10-CM | POA: Diagnosis not present

## 2014-01-10 DIAGNOSIS — M9904 Segmental and somatic dysfunction of sacral region: Secondary | ICD-10-CM | POA: Diagnosis not present

## 2014-01-10 DIAGNOSIS — M5136 Other intervertebral disc degeneration, lumbar region: Secondary | ICD-10-CM | POA: Diagnosis not present

## 2014-01-10 DIAGNOSIS — M9903 Segmental and somatic dysfunction of lumbar region: Secondary | ICD-10-CM | POA: Diagnosis not present

## 2014-01-10 DIAGNOSIS — M5416 Radiculopathy, lumbar region: Secondary | ICD-10-CM | POA: Diagnosis not present

## 2014-01-10 DIAGNOSIS — M4300 Spondylolysis, site unspecified: Secondary | ICD-10-CM | POA: Diagnosis not present

## 2014-01-19 DIAGNOSIS — Z23 Encounter for immunization: Secondary | ICD-10-CM | POA: Diagnosis not present

## 2014-02-05 DIAGNOSIS — M9904 Segmental and somatic dysfunction of sacral region: Secondary | ICD-10-CM | POA: Diagnosis not present

## 2014-02-05 DIAGNOSIS — M9903 Segmental and somatic dysfunction of lumbar region: Secondary | ICD-10-CM | POA: Diagnosis not present

## 2014-02-05 DIAGNOSIS — M4300 Spondylolysis, site unspecified: Secondary | ICD-10-CM | POA: Diagnosis not present

## 2014-02-05 DIAGNOSIS — M9902 Segmental and somatic dysfunction of thoracic region: Secondary | ICD-10-CM | POA: Diagnosis not present

## 2014-02-05 DIAGNOSIS — M5416 Radiculopathy, lumbar region: Secondary | ICD-10-CM | POA: Diagnosis not present

## 2014-02-05 DIAGNOSIS — M5136 Other intervertebral disc degeneration, lumbar region: Secondary | ICD-10-CM | POA: Diagnosis not present

## 2014-02-27 DIAGNOSIS — M4300 Spondylolysis, site unspecified: Secondary | ICD-10-CM | POA: Diagnosis not present

## 2014-02-27 DIAGNOSIS — M9902 Segmental and somatic dysfunction of thoracic region: Secondary | ICD-10-CM | POA: Diagnosis not present

## 2014-02-27 DIAGNOSIS — M5416 Radiculopathy, lumbar region: Secondary | ICD-10-CM | POA: Diagnosis not present

## 2014-02-27 DIAGNOSIS — M9904 Segmental and somatic dysfunction of sacral region: Secondary | ICD-10-CM | POA: Diagnosis not present

## 2014-02-27 DIAGNOSIS — M9903 Segmental and somatic dysfunction of lumbar region: Secondary | ICD-10-CM | POA: Diagnosis not present

## 2014-02-27 DIAGNOSIS — M5136 Other intervertebral disc degeneration, lumbar region: Secondary | ICD-10-CM | POA: Diagnosis not present

## 2014-03-21 DIAGNOSIS — M9903 Segmental and somatic dysfunction of lumbar region: Secondary | ICD-10-CM | POA: Diagnosis not present

## 2014-03-21 DIAGNOSIS — M4300 Spondylolysis, site unspecified: Secondary | ICD-10-CM | POA: Diagnosis not present

## 2014-03-21 DIAGNOSIS — M5416 Radiculopathy, lumbar region: Secondary | ICD-10-CM | POA: Diagnosis not present

## 2014-03-21 DIAGNOSIS — M9904 Segmental and somatic dysfunction of sacral region: Secondary | ICD-10-CM | POA: Diagnosis not present

## 2014-03-21 DIAGNOSIS — M9902 Segmental and somatic dysfunction of thoracic region: Secondary | ICD-10-CM | POA: Diagnosis not present

## 2014-03-21 DIAGNOSIS — M5136 Other intervertebral disc degeneration, lumbar region: Secondary | ICD-10-CM | POA: Diagnosis not present

## 2014-04-01 DIAGNOSIS — Z0001 Encounter for general adult medical examination with abnormal findings: Secondary | ICD-10-CM | POA: Diagnosis not present

## 2014-04-01 DIAGNOSIS — M8580 Other specified disorders of bone density and structure, unspecified site: Secondary | ICD-10-CM | POA: Diagnosis not present

## 2014-04-01 DIAGNOSIS — Z23 Encounter for immunization: Secondary | ICD-10-CM | POA: Diagnosis not present

## 2014-04-01 DIAGNOSIS — J449 Chronic obstructive pulmonary disease, unspecified: Secondary | ICD-10-CM | POA: Diagnosis not present

## 2014-04-01 DIAGNOSIS — R7309 Other abnormal glucose: Secondary | ICD-10-CM | POA: Diagnosis not present

## 2014-04-01 DIAGNOSIS — E785 Hyperlipidemia, unspecified: Secondary | ICD-10-CM | POA: Diagnosis not present

## 2014-04-01 DIAGNOSIS — K219 Gastro-esophageal reflux disease without esophagitis: Secondary | ICD-10-CM | POA: Diagnosis not present

## 2014-04-01 DIAGNOSIS — Z79899 Other long term (current) drug therapy: Secondary | ICD-10-CM | POA: Diagnosis not present

## 2014-04-01 DIAGNOSIS — R42 Dizziness and giddiness: Secondary | ICD-10-CM | POA: Diagnosis not present

## 2014-04-01 DIAGNOSIS — I739 Peripheral vascular disease, unspecified: Secondary | ICD-10-CM | POA: Diagnosis not present

## 2014-04-11 DIAGNOSIS — M9902 Segmental and somatic dysfunction of thoracic region: Secondary | ICD-10-CM | POA: Diagnosis not present

## 2014-04-11 DIAGNOSIS — M4300 Spondylolysis, site unspecified: Secondary | ICD-10-CM | POA: Diagnosis not present

## 2014-04-11 DIAGNOSIS — M5416 Radiculopathy, lumbar region: Secondary | ICD-10-CM | POA: Diagnosis not present

## 2014-04-11 DIAGNOSIS — M9903 Segmental and somatic dysfunction of lumbar region: Secondary | ICD-10-CM | POA: Diagnosis not present

## 2014-04-11 DIAGNOSIS — M5136 Other intervertebral disc degeneration, lumbar region: Secondary | ICD-10-CM | POA: Diagnosis not present

## 2014-04-11 DIAGNOSIS — M9904 Segmental and somatic dysfunction of sacral region: Secondary | ICD-10-CM | POA: Diagnosis not present

## 2014-05-03 DIAGNOSIS — Z961 Presence of intraocular lens: Secondary | ICD-10-CM | POA: Diagnosis not present

## 2014-05-03 DIAGNOSIS — H35372 Puckering of macula, left eye: Secondary | ICD-10-CM | POA: Diagnosis not present

## 2014-05-03 DIAGNOSIS — H52223 Regular astigmatism, bilateral: Secondary | ICD-10-CM | POA: Diagnosis not present

## 2014-05-09 DIAGNOSIS — M5136 Other intervertebral disc degeneration, lumbar region: Secondary | ICD-10-CM | POA: Diagnosis not present

## 2014-05-09 DIAGNOSIS — M9902 Segmental and somatic dysfunction of thoracic region: Secondary | ICD-10-CM | POA: Diagnosis not present

## 2014-05-09 DIAGNOSIS — M9903 Segmental and somatic dysfunction of lumbar region: Secondary | ICD-10-CM | POA: Diagnosis not present

## 2014-05-09 DIAGNOSIS — M9904 Segmental and somatic dysfunction of sacral region: Secondary | ICD-10-CM | POA: Diagnosis not present

## 2014-05-09 DIAGNOSIS — M5416 Radiculopathy, lumbar region: Secondary | ICD-10-CM | POA: Diagnosis not present

## 2014-05-09 DIAGNOSIS — M4300 Spondylolysis, site unspecified: Secondary | ICD-10-CM | POA: Diagnosis not present

## 2014-05-30 DIAGNOSIS — M9903 Segmental and somatic dysfunction of lumbar region: Secondary | ICD-10-CM | POA: Diagnosis not present

## 2014-05-30 DIAGNOSIS — M4300 Spondylolysis, site unspecified: Secondary | ICD-10-CM | POA: Diagnosis not present

## 2014-05-30 DIAGNOSIS — M5136 Other intervertebral disc degeneration, lumbar region: Secondary | ICD-10-CM | POA: Diagnosis not present

## 2014-05-30 DIAGNOSIS — M9902 Segmental and somatic dysfunction of thoracic region: Secondary | ICD-10-CM | POA: Diagnosis not present

## 2014-05-30 DIAGNOSIS — M9904 Segmental and somatic dysfunction of sacral region: Secondary | ICD-10-CM | POA: Diagnosis not present

## 2014-05-30 DIAGNOSIS — M5416 Radiculopathy, lumbar region: Secondary | ICD-10-CM | POA: Diagnosis not present

## 2014-06-27 DIAGNOSIS — M9904 Segmental and somatic dysfunction of sacral region: Secondary | ICD-10-CM | POA: Diagnosis not present

## 2014-06-27 DIAGNOSIS — M5416 Radiculopathy, lumbar region: Secondary | ICD-10-CM | POA: Diagnosis not present

## 2014-06-27 DIAGNOSIS — M9902 Segmental and somatic dysfunction of thoracic region: Secondary | ICD-10-CM | POA: Diagnosis not present

## 2014-06-27 DIAGNOSIS — M9903 Segmental and somatic dysfunction of lumbar region: Secondary | ICD-10-CM | POA: Diagnosis not present

## 2014-06-27 DIAGNOSIS — M5136 Other intervertebral disc degeneration, lumbar region: Secondary | ICD-10-CM | POA: Diagnosis not present

## 2014-06-27 DIAGNOSIS — M4300 Spondylolysis, site unspecified: Secondary | ICD-10-CM | POA: Diagnosis not present

## 2014-07-25 DIAGNOSIS — M9902 Segmental and somatic dysfunction of thoracic region: Secondary | ICD-10-CM | POA: Diagnosis not present

## 2014-07-25 DIAGNOSIS — M9904 Segmental and somatic dysfunction of sacral region: Secondary | ICD-10-CM | POA: Diagnosis not present

## 2014-07-25 DIAGNOSIS — M5416 Radiculopathy, lumbar region: Secondary | ICD-10-CM | POA: Diagnosis not present

## 2014-07-25 DIAGNOSIS — M4300 Spondylolysis, site unspecified: Secondary | ICD-10-CM | POA: Diagnosis not present

## 2014-07-25 DIAGNOSIS — M5136 Other intervertebral disc degeneration, lumbar region: Secondary | ICD-10-CM | POA: Diagnosis not present

## 2014-07-25 DIAGNOSIS — M9903 Segmental and somatic dysfunction of lumbar region: Secondary | ICD-10-CM | POA: Diagnosis not present

## 2014-08-15 DIAGNOSIS — M9903 Segmental and somatic dysfunction of lumbar region: Secondary | ICD-10-CM | POA: Diagnosis not present

## 2014-08-15 DIAGNOSIS — M4300 Spondylolysis, site unspecified: Secondary | ICD-10-CM | POA: Diagnosis not present

## 2014-08-15 DIAGNOSIS — M9904 Segmental and somatic dysfunction of sacral region: Secondary | ICD-10-CM | POA: Diagnosis not present

## 2014-08-15 DIAGNOSIS — M5416 Radiculopathy, lumbar region: Secondary | ICD-10-CM | POA: Diagnosis not present

## 2014-08-15 DIAGNOSIS — M5136 Other intervertebral disc degeneration, lumbar region: Secondary | ICD-10-CM | POA: Diagnosis not present

## 2014-08-15 DIAGNOSIS — M9902 Segmental and somatic dysfunction of thoracic region: Secondary | ICD-10-CM | POA: Diagnosis not present

## 2014-09-05 DIAGNOSIS — M4300 Spondylolysis, site unspecified: Secondary | ICD-10-CM | POA: Diagnosis not present

## 2014-09-05 DIAGNOSIS — M5416 Radiculopathy, lumbar region: Secondary | ICD-10-CM | POA: Diagnosis not present

## 2014-09-05 DIAGNOSIS — M9903 Segmental and somatic dysfunction of lumbar region: Secondary | ICD-10-CM | POA: Diagnosis not present

## 2014-09-05 DIAGNOSIS — M9904 Segmental and somatic dysfunction of sacral region: Secondary | ICD-10-CM | POA: Diagnosis not present

## 2014-09-05 DIAGNOSIS — M5136 Other intervertebral disc degeneration, lumbar region: Secondary | ICD-10-CM | POA: Diagnosis not present

## 2014-09-05 DIAGNOSIS — M9902 Segmental and somatic dysfunction of thoracic region: Secondary | ICD-10-CM | POA: Diagnosis not present

## 2014-09-09 ENCOUNTER — Other Ambulatory Visit: Payer: Self-pay | Admitting: Obstetrics and Gynecology

## 2014-09-09 DIAGNOSIS — Z6841 Body Mass Index (BMI) 40.0 and over, adult: Secondary | ICD-10-CM | POA: Diagnosis not present

## 2014-09-09 DIAGNOSIS — Z124 Encounter for screening for malignant neoplasm of cervix: Secondary | ICD-10-CM | POA: Diagnosis not present

## 2014-09-09 DIAGNOSIS — Z1272 Encounter for screening for malignant neoplasm of vagina: Secondary | ICD-10-CM | POA: Diagnosis not present

## 2014-09-09 DIAGNOSIS — Z90710 Acquired absence of both cervix and uterus: Secondary | ICD-10-CM | POA: Diagnosis not present

## 2014-09-10 LAB — CYTOLOGY - PAP

## 2014-09-26 DIAGNOSIS — M5136 Other intervertebral disc degeneration, lumbar region: Secondary | ICD-10-CM | POA: Diagnosis not present

## 2014-09-26 DIAGNOSIS — M9903 Segmental and somatic dysfunction of lumbar region: Secondary | ICD-10-CM | POA: Diagnosis not present

## 2014-09-26 DIAGNOSIS — M5416 Radiculopathy, lumbar region: Secondary | ICD-10-CM | POA: Diagnosis not present

## 2014-09-26 DIAGNOSIS — M4300 Spondylolysis, site unspecified: Secondary | ICD-10-CM | POA: Diagnosis not present

## 2014-09-26 DIAGNOSIS — M9902 Segmental and somatic dysfunction of thoracic region: Secondary | ICD-10-CM | POA: Diagnosis not present

## 2014-09-26 DIAGNOSIS — M9904 Segmental and somatic dysfunction of sacral region: Secondary | ICD-10-CM | POA: Diagnosis not present

## 2014-10-17 DIAGNOSIS — M4300 Spondylolysis, site unspecified: Secondary | ICD-10-CM | POA: Diagnosis not present

## 2014-10-17 DIAGNOSIS — M9904 Segmental and somatic dysfunction of sacral region: Secondary | ICD-10-CM | POA: Diagnosis not present

## 2014-10-17 DIAGNOSIS — M9903 Segmental and somatic dysfunction of lumbar region: Secondary | ICD-10-CM | POA: Diagnosis not present

## 2014-10-17 DIAGNOSIS — M5136 Other intervertebral disc degeneration, lumbar region: Secondary | ICD-10-CM | POA: Diagnosis not present

## 2014-10-17 DIAGNOSIS — M5416 Radiculopathy, lumbar region: Secondary | ICD-10-CM | POA: Diagnosis not present

## 2014-10-17 DIAGNOSIS — M9902 Segmental and somatic dysfunction of thoracic region: Secondary | ICD-10-CM | POA: Diagnosis not present

## 2014-11-01 DIAGNOSIS — H35372 Puckering of macula, left eye: Secondary | ICD-10-CM | POA: Diagnosis not present

## 2014-11-04 ENCOUNTER — Other Ambulatory Visit: Payer: Self-pay

## 2014-11-04 DIAGNOSIS — Z1231 Encounter for screening mammogram for malignant neoplasm of breast: Secondary | ICD-10-CM

## 2014-11-04 DIAGNOSIS — G4733 Obstructive sleep apnea (adult) (pediatric): Secondary | ICD-10-CM | POA: Diagnosis not present

## 2014-11-14 DIAGNOSIS — M9904 Segmental and somatic dysfunction of sacral region: Secondary | ICD-10-CM | POA: Diagnosis not present

## 2014-11-14 DIAGNOSIS — M5416 Radiculopathy, lumbar region: Secondary | ICD-10-CM | POA: Diagnosis not present

## 2014-11-14 DIAGNOSIS — M9903 Segmental and somatic dysfunction of lumbar region: Secondary | ICD-10-CM | POA: Diagnosis not present

## 2014-11-14 DIAGNOSIS — M5136 Other intervertebral disc degeneration, lumbar region: Secondary | ICD-10-CM | POA: Diagnosis not present

## 2014-11-14 DIAGNOSIS — M9902 Segmental and somatic dysfunction of thoracic region: Secondary | ICD-10-CM | POA: Diagnosis not present

## 2014-11-14 DIAGNOSIS — M4300 Spondylolysis, site unspecified: Secondary | ICD-10-CM | POA: Diagnosis not present

## 2014-11-19 DIAGNOSIS — Z79899 Other long term (current) drug therapy: Secondary | ICD-10-CM | POA: Diagnosis not present

## 2014-11-19 DIAGNOSIS — E785 Hyperlipidemia, unspecified: Secondary | ICD-10-CM | POA: Diagnosis not present

## 2014-11-19 DIAGNOSIS — J449 Chronic obstructive pulmonary disease, unspecified: Secondary | ICD-10-CM | POA: Diagnosis not present

## 2014-11-19 DIAGNOSIS — R7309 Other abnormal glucose: Secondary | ICD-10-CM | POA: Diagnosis not present

## 2014-12-12 DIAGNOSIS — M5136 Other intervertebral disc degeneration, lumbar region: Secondary | ICD-10-CM | POA: Diagnosis not present

## 2014-12-12 DIAGNOSIS — M9902 Segmental and somatic dysfunction of thoracic region: Secondary | ICD-10-CM | POA: Diagnosis not present

## 2014-12-12 DIAGNOSIS — M5416 Radiculopathy, lumbar region: Secondary | ICD-10-CM | POA: Diagnosis not present

## 2014-12-12 DIAGNOSIS — M9904 Segmental and somatic dysfunction of sacral region: Secondary | ICD-10-CM | POA: Diagnosis not present

## 2014-12-12 DIAGNOSIS — M9903 Segmental and somatic dysfunction of lumbar region: Secondary | ICD-10-CM | POA: Diagnosis not present

## 2014-12-12 DIAGNOSIS — M4300 Spondylolysis, site unspecified: Secondary | ICD-10-CM | POA: Diagnosis not present

## 2014-12-16 ENCOUNTER — Ambulatory Visit
Admission: RE | Admit: 2014-12-16 | Discharge: 2014-12-16 | Disposition: A | Discharge status: Home / Self Care | Payer: Medicare Other | Source: Ambulatory Visit

## 2014-12-16 DIAGNOSIS — Z1231 Encounter for screening mammogram for malignant neoplasm of breast: Secondary | ICD-10-CM

## 2014-12-18 ENCOUNTER — Ambulatory Visit: Payer: Medicare Other | Admitting: Family

## 2014-12-18 ENCOUNTER — Encounter (HOSPITAL_COMMUNITY): Payer: Medicare Other

## 2014-12-21 DIAGNOSIS — Z23 Encounter for immunization: Secondary | ICD-10-CM | POA: Diagnosis not present

## 2015-01-09 DIAGNOSIS — M4300 Spondylolysis, site unspecified: Secondary | ICD-10-CM | POA: Diagnosis not present

## 2015-01-09 DIAGNOSIS — M9904 Segmental and somatic dysfunction of sacral region: Secondary | ICD-10-CM | POA: Diagnosis not present

## 2015-01-09 DIAGNOSIS — M5136 Other intervertebral disc degeneration, lumbar region: Secondary | ICD-10-CM | POA: Diagnosis not present

## 2015-01-09 DIAGNOSIS — M5416 Radiculopathy, lumbar region: Secondary | ICD-10-CM | POA: Diagnosis not present

## 2015-01-09 DIAGNOSIS — M9902 Segmental and somatic dysfunction of thoracic region: Secondary | ICD-10-CM | POA: Diagnosis not present

## 2015-01-09 DIAGNOSIS — M9903 Segmental and somatic dysfunction of lumbar region: Secondary | ICD-10-CM | POA: Diagnosis not present

## 2015-02-05 ENCOUNTER — Ambulatory Visit: Payer: Medicare Other | Admitting: Family

## 2015-02-05 ENCOUNTER — Encounter (HOSPITAL_COMMUNITY): Payer: Medicare Other

## 2015-02-13 DIAGNOSIS — M9902 Segmental and somatic dysfunction of thoracic region: Secondary | ICD-10-CM | POA: Diagnosis not present

## 2015-02-13 DIAGNOSIS — M9904 Segmental and somatic dysfunction of sacral region: Secondary | ICD-10-CM | POA: Diagnosis not present

## 2015-02-13 DIAGNOSIS — M5136 Other intervertebral disc degeneration, lumbar region: Secondary | ICD-10-CM | POA: Diagnosis not present

## 2015-02-13 DIAGNOSIS — M9903 Segmental and somatic dysfunction of lumbar region: Secondary | ICD-10-CM | POA: Diagnosis not present

## 2015-02-13 DIAGNOSIS — M5416 Radiculopathy, lumbar region: Secondary | ICD-10-CM | POA: Diagnosis not present

## 2015-02-13 DIAGNOSIS — M4300 Spondylolysis, site unspecified: Secondary | ICD-10-CM | POA: Diagnosis not present

## 2015-02-14 DIAGNOSIS — R309 Painful micturition, unspecified: Secondary | ICD-10-CM | POA: Diagnosis not present

## 2015-02-14 DIAGNOSIS — N390 Urinary tract infection, site not specified: Secondary | ICD-10-CM | POA: Diagnosis not present

## 2015-03-06 DIAGNOSIS — M4300 Spondylolysis, site unspecified: Secondary | ICD-10-CM | POA: Diagnosis not present

## 2015-03-06 DIAGNOSIS — M9904 Segmental and somatic dysfunction of sacral region: Secondary | ICD-10-CM | POA: Diagnosis not present

## 2015-03-06 DIAGNOSIS — M5416 Radiculopathy, lumbar region: Secondary | ICD-10-CM | POA: Diagnosis not present

## 2015-03-06 DIAGNOSIS — M5136 Other intervertebral disc degeneration, lumbar region: Secondary | ICD-10-CM | POA: Diagnosis not present

## 2015-03-06 DIAGNOSIS — M9903 Segmental and somatic dysfunction of lumbar region: Secondary | ICD-10-CM | POA: Diagnosis not present

## 2015-03-06 DIAGNOSIS — M9902 Segmental and somatic dysfunction of thoracic region: Secondary | ICD-10-CM | POA: Diagnosis not present

## 2015-03-26 DIAGNOSIS — M5136 Other intervertebral disc degeneration, lumbar region: Secondary | ICD-10-CM | POA: Diagnosis not present

## 2015-03-26 DIAGNOSIS — M9903 Segmental and somatic dysfunction of lumbar region: Secondary | ICD-10-CM | POA: Diagnosis not present

## 2015-03-26 DIAGNOSIS — M9902 Segmental and somatic dysfunction of thoracic region: Secondary | ICD-10-CM | POA: Diagnosis not present

## 2015-03-26 DIAGNOSIS — M4300 Spondylolysis, site unspecified: Secondary | ICD-10-CM | POA: Diagnosis not present

## 2015-03-26 DIAGNOSIS — M9904 Segmental and somatic dysfunction of sacral region: Secondary | ICD-10-CM | POA: Diagnosis not present

## 2015-03-26 DIAGNOSIS — M5416 Radiculopathy, lumbar region: Secondary | ICD-10-CM | POA: Diagnosis not present

## 2015-04-04 ENCOUNTER — Encounter: Payer: Self-pay | Admitting: Family

## 2015-04-11 ENCOUNTER — Encounter: Payer: Medicare Other | Admitting: Family

## 2015-04-11 ENCOUNTER — Other Ambulatory Visit: Payer: Self-pay

## 2015-04-11 ENCOUNTER — Ambulatory Visit (INDEPENDENT_AMBULATORY_CARE_PROVIDER_SITE_OTHER)
Admission: RE | Admit: 2015-04-11 | Discharge: 2015-04-11 | Disposition: A | Discharge status: Home / Self Care | Payer: Medicare Other | Source: Ambulatory Visit | Attending: Family | Admitting: Family

## 2015-04-11 ENCOUNTER — Ambulatory Visit (HOSPITAL_COMMUNITY)
Admission: RE | Admit: 2015-04-11 | Discharge: 2015-04-11 | Disposition: A | Discharge status: Home / Self Care | Payer: Medicare Other | Source: Ambulatory Visit | Attending: Family | Admitting: Family

## 2015-04-11 DIAGNOSIS — E785 Hyperlipidemia, unspecified: Secondary | ICD-10-CM | POA: Insufficient documentation

## 2015-04-11 DIAGNOSIS — I771 Stricture of artery: Secondary | ICD-10-CM

## 2015-04-11 DIAGNOSIS — Z95820 Peripheral vascular angioplasty status with implants and grafts: Secondary | ICD-10-CM | POA: Insufficient documentation

## 2015-04-11 DIAGNOSIS — I70201 Unspecified atherosclerosis of native arteries of extremities, right leg: Secondary | ICD-10-CM | POA: Insufficient documentation

## 2015-04-11 DIAGNOSIS — Z48812 Encounter for surgical aftercare following surgery on the circulatory system: Secondary | ICD-10-CM | POA: Diagnosis not present

## 2015-04-11 NOTE — Patient Instructions (Signed)
Dear Ms. Kristin Williams Your recent Vascular Lab  Study of Jan. 27, 2017 indicates:  Normal   Please continue walking.  Follow up in one year!          Peripheral Vascular Disease Peripheral vascular disease (PVD) is a disease of the blood vessels that are not part of your heart and brain. A simple term for PVD is poor circulation. In most cases, PVD narrows the blood vessels that carry blood from your heart to the rest of your body. This can result in a decreased supply of blood to your arms, legs, and internal organs, like your stomach or kidneys. However, it most often affects a person's lower legs and feet. There are two types of PVD.  Organic PVD. This is the more common type. It is caused by damage to the structure of blood vessels.  Functional PVD. This is caused by conditions that make blood vessels contract and tighten (spasm). Without treatment, PVD tends to get worse over time. PVD can also lead to acute ischemic limb. This is when an arm or limb suddenly has trouble getting enough blood. This is a medical emergency. CAUSES Each type of PVD has many different causes. The most common cause of PVD is buildup of a fatty material (plaque) inside of your arteries (atherosclerosis). Small amounts of plaque can break off from the walls of the blood vessels and become lodged in a smaller artery. This blocks blood flow and can cause acute ischemic limb. Other common causes of PVD include:  Blood clots that form inside of blood vessels.  Injuries to blood vessels.  Diseases that cause inflammation of blood vessels or cause blood vessel spasms.  Health behaviors and health history that increase your risk of developing PVD. RISK FACTORS  You may have a greater risk of PVD if you:  Have a family history of PVD.  Have certain medical conditions, including:  High cholesterol.  Diabetes.  High blood pressure (hypertension).  Coronary heart disease.  Past problems with blood  clots.  Past injury, such as burns or a broken bone. These may have damaged blood vessels in your limbs.  Buerger disease. This is caused by inflamed blood vessels in your hands and feet.  Some forms of arthritis.  Rare birth defects that affect the arteries in your legs.  Use tobacco.  Do not get enough exercise.  Are obese.  Are age 68 or older. SIGNS AND SYMPTOMS  PVD may cause many different symptoms. Your symptoms depend on what part of your body is not getting enough blood. Some common signs and symptoms include:  Cramps in your lower legs. This may be a symptom of poor leg circulation (claudication).  Pain and weakness in your legs while you are physically active that goes away when you rest (intermittent claudication).  Leg pain when at rest.  Leg numbness, tingling, or weakness.  Coldness in a leg or foot, especially when compared with the other leg.  Skin or hair changes. These can include:  Hair loss.  Shiny skin.  Pale or bluish skin.  Thick toenails.  Inability to get or maintain an erection (erectile dysfunction). People with PVD are more prone to developing ulcers and sores on their toes, feet, or legs. These may take longer than normal to heal. DIAGNOSIS Your health care provider may diagnose PVD from your signs and symptoms. The health care provider will also do a physical exam. You may have tests to find out what is causing your PVD  and determine its severity. Tests may include:  Blood pressure recordings from your arms and legs and measurements of the strength of your pulses (pulse volume recordings).  Imaging studies using sound waves to take pictures of the blood flow through your blood vessels (Doppler ultrasound).  Injecting a dye into your blood vessels before having imaging studies using:  X-rays (angiogram or arteriogram).  Computer-generated X-rays (CT angiogram).  A powerful electromagnetic field and a computer (magnetic resonance  angiogram or MRA). TREATMENT Treatment for PVD depends on the cause of your condition and the severity of your symptoms. It also depends on your age. Underlying causes need to be treated and controlled. These include long-lasting (chronic) conditions, such as diabetes, high cholesterol, and high blood pressure. You may need to first try making lifestyle changes and taking medicines. Surgery may be needed if these do not work. Lifestyle changes may include:  Quitting smoking.  Exercising regularly.  Following a low-fat, low-cholesterol diet. Medicines may include:  Blood thinners to prevent blood clots.  Medicines to improve blood flow.  Medicines to improve your blood cholesterol levels. Surgical procedures may include:  A procedure that uses an inflated balloon to open a blocked artery and improve blood flow (angioplasty).  A procedure to put in a tube (stent) to keep a blocked artery open (stent implant).  Surgery to reroute blood flow around a blocked artery (peripheral bypass surgery).  Surgery to remove dead tissue from an infected wound on the affected limb.  Amputation. This is surgical removal of the affected limb. This may be necessary in cases of acute ischemic limb that are not improved through medical or surgical treatments. HOME CARE INSTRUCTIONS  Take medicines only as directed by your health care provider.  Do not use any tobacco products, including cigarettes, chewing tobacco, or electronic cigarettes. If you need help quitting, ask your health care provider.  Lose weight if you are overweight, and maintain a healthy weight as directed by your health care provider.  Eat a diet that is low in fat and cholesterol. If you need help, ask your health care provider.  Exercise regularly. Ask your health care provider to suggest some good activities for you.  Use compression stockings or other mechanical devices as directed by your health care provider.  Take good  care of your feet.  Wear comfortable shoes that fit well.  Check your feet often for any cuts or sores. SEEK MEDICAL CARE IF:  You have cramps in your legs while walking.  You have leg pain when you are at rest.  You have coldness in a leg or foot.  Your skin changes.  You have erectile dysfunction.  You have cuts or sores on your feet that are not healing. SEEK IMMEDIATE MEDICAL CARE IF:  Your arm or leg turns cold and blue.  Your arms or legs become red, warm, swollen, painful, or numb.  You have chest pain or trouble breathing.  You suddenly have weakness in your face, arm, or leg.  You become very confused or lose the ability to speak.  You suddenly have a very bad headache or lose your vision.   This information is not intended to replace advice given to you by your health care provider. Make sure you discuss any questions you have with your health care provider.   Document Released: 04/08/2004 Document Revised: 03/22/2014 Document Reviewed: 08/09/2013 Elsevier Interactive Patient Education [employer].

## 2015-04-11 NOTE — Progress Notes (Signed)
Lab only 

## 2015-04-11 NOTE — Patient Instructions (Signed)
Peripheral Vascular Disease Peripheral vascular disease (PVD) is a disease of the blood vessels that are not part of your heart and brain. A simple term for PVD is poor circulation. In most cases, PVD narrows the blood vessels that carry blood from your heart to the rest of your body. This can result in a decreased supply of blood to your arms, legs, and internal organs, like your stomach or kidneys. However, it most often affects a person's lower legs and feet. There are two types of PVD.  Organic PVD. This is the more common type. It is caused by damage to the structure of blood vessels.  Functional PVD. This is caused by conditions that make blood vessels contract and tighten (spasm). Without treatment, PVD tends to get worse over time. PVD can also lead to acute ischemic limb. This is when an arm or limb suddenly has trouble getting enough blood. This is a medical emergency. CAUSES Each type of PVD has many different causes. The most common cause of PVD is buildup of a fatty material (plaque) inside of your arteries (atherosclerosis). Small amounts of plaque can break off from the walls of the blood vessels and become lodged in a smaller artery. This blocks blood flow and can cause acute ischemic limb. Other common causes of PVD include:  Blood clots that form inside of blood vessels.  Injuries to blood vessels.  Diseases that cause inflammation of blood vessels or cause blood vessel spasms.  Health behaviors and health history that increase your risk of developing PVD. RISK FACTORS  You may have a greater risk of PVD if you:  Have a family history of PVD.  Have certain medical conditions, including:  High cholesterol.  Diabetes.  High blood pressure (hypertension).  Coronary heart disease.  Past problems with blood clots.  Past injury, such as burns or a broken bone. These may have damaged blood vessels in your limbs.  Buerger disease. This is caused by inflamed blood  vessels in your hands and feet.  Some forms of arthritis.  Rare birth defects that affect the arteries in your legs.  Use tobacco.  Do not get enough exercise.  Are obese.  Are age 50 or older. SIGNS AND SYMPTOMS  PVD may cause many different symptoms. Your symptoms depend on what part of your body is not getting enough blood. Some common signs and symptoms include:  Cramps in your lower legs. This may be a symptom of poor leg circulation (claudication).  Pain and weakness in your legs while you are physically active that goes away when you rest (intermittent claudication).  Leg pain when at rest.  Leg numbness, tingling, or weakness.  Coldness in a leg or foot, especially when compared with the other leg.  Skin or hair changes. These can include:  Hair loss.  Shiny skin.  Pale or bluish skin.  Thick toenails.  Inability to get or maintain an erection (erectile dysfunction). People with PVD are more prone to developing ulcers and sores on their toes, feet, or legs. These may take longer than normal to heal. DIAGNOSIS Your health care provider may diagnose PVD from your signs and symptoms. The health care provider will also do a physical exam. You may have tests to find out what is causing your PVD and determine its severity. Tests may include:  Blood pressure recordings from your arms and legs and measurements of the strength of your pulses (pulse volume recordings).  Imaging studies using sound waves to take pictures of   the blood flow through your blood vessels (Doppler ultrasound).  Injecting a dye into your blood vessels before having imaging studies using:  X-rays (angiogram or arteriogram).  Computer-generated X-rays (CT angiogram).  A powerful electromagnetic field and a computer (magnetic resonance angiogram or MRA). TREATMENT Treatment for PVD depends on the cause of your condition and the severity of your symptoms. It also depends on your age. Underlying  causes need to be treated and controlled. These include long-lasting (chronic) conditions, such as diabetes, high cholesterol, and high blood pressure. You may need to first try making lifestyle changes and taking medicines. Surgery may be needed if these do not work. Lifestyle changes may include:  Quitting smoking.  Exercising regularly.  Following a low-fat, low-cholesterol diet. Medicines may include:  Blood thinners to prevent blood clots.  Medicines to improve blood flow.  Medicines to improve your blood cholesterol levels. Surgical procedures may include:  A procedure that uses an inflated balloon to open a blocked artery and improve blood flow (angioplasty).  A procedure to put in a tube (stent) to keep a blocked artery open (stent implant).  Surgery to reroute blood flow around a blocked artery (peripheral bypass surgery).  Surgery to remove dead tissue from an infected wound on the affected limb.  Amputation. This is surgical removal of the affected limb. This may be necessary in cases of acute ischemic limb that are not improved through medical or surgical treatments. HOME CARE INSTRUCTIONS  Take medicines only as directed by your health care provider.  Do not use any tobacco products, including cigarettes, chewing tobacco, or electronic cigarettes. If you need help quitting, ask your health care provider.  Lose weight if you are overweight, and maintain a healthy weight as directed by your health care provider.  Eat a diet that is low in fat and cholesterol. If you need help, ask your health care provider.  Exercise regularly. Ask your health care provider to suggest some good activities for you.  Use compression stockings or other mechanical devices as directed by your health care provider.  Take good care of your feet.  Wear comfortable shoes that fit well.  Check your feet often for any cuts or sores. SEEK MEDICAL CARE IF:  You have cramps in your legs  while walking.  You have leg pain when you are at rest.  You have coldness in a leg or foot.  Your skin changes.  You have erectile dysfunction.  You have cuts or sores on your feet that are not healing. SEEK IMMEDIATE MEDICAL CARE IF:  Your arm or leg turns cold and blue.  Your arms or legs become red, warm, swollen, painful, or numb.  You have chest pain or trouble breathing.  You suddenly have weakness in your face, arm, or leg.  You become very confused or lose the ability to speak.  You suddenly have a very bad headache or lose your vision.   This information is not intended to replace advice given to you by your health care provider. Make sure you discuss any questions you have with your health care provider.   Document Released: 04/08/2004 Document Revised: 03/22/2014 Document Reviewed: 08/09/2013 Elsevier Interactive Patient Education 2016 [employer]  

## 2015-04-16 DIAGNOSIS — M9902 Segmental and somatic dysfunction of thoracic region: Secondary | ICD-10-CM | POA: Diagnosis not present

## 2015-04-16 DIAGNOSIS — M5136 Other intervertebral disc degeneration, lumbar region: Secondary | ICD-10-CM | POA: Diagnosis not present

## 2015-04-16 DIAGNOSIS — M9903 Segmental and somatic dysfunction of lumbar region: Secondary | ICD-10-CM | POA: Diagnosis not present

## 2015-04-16 DIAGNOSIS — M9904 Segmental and somatic dysfunction of sacral region: Secondary | ICD-10-CM | POA: Diagnosis not present

## 2015-04-16 DIAGNOSIS — M4300 Spondylolysis, site unspecified: Secondary | ICD-10-CM | POA: Diagnosis not present

## 2015-04-16 DIAGNOSIS — M5416 Radiculopathy, lumbar region: Secondary | ICD-10-CM | POA: Diagnosis not present

## 2015-04-30 DIAGNOSIS — H35372 Puckering of macula, left eye: Secondary | ICD-10-CM | POA: Diagnosis not present

## 2015-05-05 DIAGNOSIS — H43812 Vitreous degeneration, left eye: Secondary | ICD-10-CM | POA: Diagnosis not present

## 2015-05-05 DIAGNOSIS — H4322 Crystalline deposits in vitreous body, left eye: Secondary | ICD-10-CM | POA: Diagnosis not present

## 2015-05-05 DIAGNOSIS — H35372 Puckering of macula, left eye: Secondary | ICD-10-CM | POA: Diagnosis not present

## 2015-05-05 DIAGNOSIS — Z961 Presence of intraocular lens: Secondary | ICD-10-CM | POA: Diagnosis not present

## 2015-05-15 DIAGNOSIS — M5416 Radiculopathy, lumbar region: Secondary | ICD-10-CM | POA: Diagnosis not present

## 2015-05-15 DIAGNOSIS — M9902 Segmental and somatic dysfunction of thoracic region: Secondary | ICD-10-CM | POA: Diagnosis not present

## 2015-05-15 DIAGNOSIS — M4300 Spondylolysis, site unspecified: Secondary | ICD-10-CM | POA: Diagnosis not present

## 2015-05-15 DIAGNOSIS — M5136 Other intervertebral disc degeneration, lumbar region: Secondary | ICD-10-CM | POA: Diagnosis not present

## 2015-05-15 DIAGNOSIS — M9903 Segmental and somatic dysfunction of lumbar region: Secondary | ICD-10-CM | POA: Diagnosis not present

## 2015-05-15 DIAGNOSIS — M9904 Segmental and somatic dysfunction of sacral region: Secondary | ICD-10-CM | POA: Diagnosis not present

## 2015-06-05 DIAGNOSIS — M5136 Other intervertebral disc degeneration, lumbar region: Secondary | ICD-10-CM | POA: Diagnosis not present

## 2015-06-05 DIAGNOSIS — M4300 Spondylolysis, site unspecified: Secondary | ICD-10-CM | POA: Diagnosis not present

## 2015-06-05 DIAGNOSIS — M5416 Radiculopathy, lumbar region: Secondary | ICD-10-CM | POA: Diagnosis not present

## 2015-06-05 DIAGNOSIS — M9903 Segmental and somatic dysfunction of lumbar region: Secondary | ICD-10-CM | POA: Diagnosis not present

## 2015-06-05 DIAGNOSIS — M9902 Segmental and somatic dysfunction of thoracic region: Secondary | ICD-10-CM | POA: Diagnosis not present

## 2015-06-05 DIAGNOSIS — M9904 Segmental and somatic dysfunction of sacral region: Secondary | ICD-10-CM | POA: Diagnosis not present

## 2015-06-24 DIAGNOSIS — Z0000 Encounter for general adult medical examination without abnormal findings: Secondary | ICD-10-CM | POA: Diagnosis not present

## 2015-06-24 DIAGNOSIS — Z79899 Other long term (current) drug therapy: Secondary | ICD-10-CM | POA: Diagnosis not present

## 2015-06-24 DIAGNOSIS — R1084 Generalized abdominal pain: Secondary | ICD-10-CM | POA: Diagnosis not present

## 2015-06-24 DIAGNOSIS — J449 Chronic obstructive pulmonary disease, unspecified: Secondary | ICD-10-CM | POA: Diagnosis not present

## 2015-06-24 DIAGNOSIS — I739 Peripheral vascular disease, unspecified: Secondary | ICD-10-CM | POA: Diagnosis not present

## 2015-06-24 DIAGNOSIS — F17210 Nicotine dependence, cigarettes, uncomplicated: Secondary | ICD-10-CM | POA: Diagnosis not present

## 2015-06-24 DIAGNOSIS — Z1389 Encounter for screening for other disorder: Secondary | ICD-10-CM | POA: Diagnosis not present

## 2015-06-24 DIAGNOSIS — R7301 Impaired fasting glucose: Secondary | ICD-10-CM | POA: Diagnosis not present

## 2015-06-24 DIAGNOSIS — M8580 Other specified disorders of bone density and structure, unspecified site: Secondary | ICD-10-CM | POA: Diagnosis not present

## 2015-06-24 DIAGNOSIS — N951 Menopausal and female climacteric states: Secondary | ICD-10-CM | POA: Diagnosis not present

## 2015-06-24 DIAGNOSIS — E7800 Pure hypercholesterolemia, unspecified: Secondary | ICD-10-CM | POA: Diagnosis not present

## 2015-06-26 DIAGNOSIS — M4300 Spondylolysis, site unspecified: Secondary | ICD-10-CM | POA: Diagnosis not present

## 2015-06-26 DIAGNOSIS — M9903 Segmental and somatic dysfunction of lumbar region: Secondary | ICD-10-CM | POA: Diagnosis not present

## 2015-06-26 DIAGNOSIS — M5416 Radiculopathy, lumbar region: Secondary | ICD-10-CM | POA: Diagnosis not present

## 2015-06-26 DIAGNOSIS — M5136 Other intervertebral disc degeneration, lumbar region: Secondary | ICD-10-CM | POA: Diagnosis not present

## 2015-06-26 DIAGNOSIS — M9902 Segmental and somatic dysfunction of thoracic region: Secondary | ICD-10-CM | POA: Diagnosis not present

## 2015-06-26 DIAGNOSIS — M9904 Segmental and somatic dysfunction of sacral region: Secondary | ICD-10-CM | POA: Diagnosis not present

## 2015-07-03 DIAGNOSIS — H43812 Vitreous degeneration, left eye: Secondary | ICD-10-CM | POA: Diagnosis not present

## 2015-07-03 DIAGNOSIS — H43392 Other vitreous opacities, left eye: Secondary | ICD-10-CM | POA: Diagnosis not present

## 2015-07-03 DIAGNOSIS — H4322 Crystalline deposits in vitreous body, left eye: Secondary | ICD-10-CM | POA: Diagnosis not present

## 2015-07-03 DIAGNOSIS — H35372 Puckering of macula, left eye: Secondary | ICD-10-CM | POA: Diagnosis not present

## 2015-07-16 DIAGNOSIS — H35372 Puckering of macula, left eye: Secondary | ICD-10-CM | POA: Diagnosis not present

## 2015-07-16 DIAGNOSIS — H547 Unspecified visual loss: Secondary | ICD-10-CM | POA: Diagnosis not present

## 2015-07-31 ENCOUNTER — Telehealth: Payer: Self-pay | Admitting: Acute Care

## 2015-07-31 DIAGNOSIS — M9903 Segmental and somatic dysfunction of lumbar region: Secondary | ICD-10-CM | POA: Diagnosis not present

## 2015-07-31 DIAGNOSIS — M5136 Other intervertebral disc degeneration, lumbar region: Secondary | ICD-10-CM | POA: Diagnosis not present

## 2015-07-31 DIAGNOSIS — M9902 Segmental and somatic dysfunction of thoracic region: Secondary | ICD-10-CM | POA: Diagnosis not present

## 2015-07-31 DIAGNOSIS — M4300 Spondylolysis, site unspecified: Secondary | ICD-10-CM | POA: Diagnosis not present

## 2015-07-31 DIAGNOSIS — M5416 Radiculopathy, lumbar region: Secondary | ICD-10-CM | POA: Diagnosis not present

## 2015-07-31 DIAGNOSIS — M9904 Segmental and somatic dysfunction of sacral region: Secondary | ICD-10-CM | POA: Diagnosis not present

## 2015-08-06 NOTE — Telephone Encounter (Signed)
LVM for pt to return call

## 2015-08-06 NOTE — Telephone Encounter (Signed)
Patient called- returning Kristin Williams's call -prm

## 2015-08-07 NOTE — Telephone Encounter (Signed)
LVM for pt to return call

## 2015-08-07 NOTE — Telephone Encounter (Signed)
Patient returning Kristin Williams's call- she will be available Tuesday 08/12/15 in the am [phone]-prm

## 2015-08-14 NOTE — Telephone Encounter (Signed)
Routing to Lung Nodule Pool for follow up.

## 2015-08-28 DIAGNOSIS — M9902 Segmental and somatic dysfunction of thoracic region: Secondary | ICD-10-CM | POA: Diagnosis not present

## 2015-08-28 DIAGNOSIS — M9904 Segmental and somatic dysfunction of sacral region: Secondary | ICD-10-CM | POA: Diagnosis not present

## 2015-08-28 DIAGNOSIS — M5136 Other intervertebral disc degeneration, lumbar region: Secondary | ICD-10-CM | POA: Diagnosis not present

## 2015-08-28 DIAGNOSIS — M8589 Other specified disorders of bone density and structure, multiple sites: Secondary | ICD-10-CM | POA: Diagnosis not present

## 2015-08-28 DIAGNOSIS — M9903 Segmental and somatic dysfunction of lumbar region: Secondary | ICD-10-CM | POA: Diagnosis not present

## 2015-08-28 DIAGNOSIS — Z780 Asymptomatic menopausal state: Secondary | ICD-10-CM | POA: Diagnosis not present

## 2015-08-28 DIAGNOSIS — M4300 Spondylolysis, site unspecified: Secondary | ICD-10-CM | POA: Diagnosis not present

## 2015-08-28 DIAGNOSIS — M5416 Radiculopathy, lumbar region: Secondary | ICD-10-CM | POA: Diagnosis not present

## 2015-08-28 NOTE — Telephone Encounter (Signed)
LVM for pt to return call

## 2015-09-10 NOTE — Telephone Encounter (Signed)
LVM for pt to return call

## 2015-09-10 NOTE — Telephone Encounter (Signed)
[phone] pt calling back wants August

## 2015-09-12 ENCOUNTER — Telehealth: Payer: Self-pay | Admitting: Acute Care

## 2015-09-12 DIAGNOSIS — F17210 Nicotine dependence, cigarettes, uncomplicated: Secondary | ICD-10-CM

## 2015-09-18 DIAGNOSIS — M5136 Other intervertebral disc degeneration, lumbar region: Secondary | ICD-10-CM | POA: Diagnosis not present

## 2015-09-18 DIAGNOSIS — M9904 Segmental and somatic dysfunction of sacral region: Secondary | ICD-10-CM | POA: Diagnosis not present

## 2015-09-18 DIAGNOSIS — Z09 Encounter for follow-up examination after completed treatment for conditions other than malignant neoplasm: Secondary | ICD-10-CM | POA: Diagnosis not present

## 2015-09-18 DIAGNOSIS — H4322 Crystalline deposits in vitreous body, left eye: Secondary | ICD-10-CM | POA: Diagnosis not present

## 2015-09-18 DIAGNOSIS — M4300 Spondylolysis, site unspecified: Secondary | ICD-10-CM | POA: Diagnosis not present

## 2015-09-18 DIAGNOSIS — M9903 Segmental and somatic dysfunction of lumbar region: Secondary | ICD-10-CM | POA: Diagnosis not present

## 2015-09-18 DIAGNOSIS — H35372 Puckering of macula, left eye: Secondary | ICD-10-CM | POA: Diagnosis not present

## 2015-09-18 DIAGNOSIS — M5416 Radiculopathy, lumbar region: Secondary | ICD-10-CM | POA: Diagnosis not present

## 2015-09-18 DIAGNOSIS — M9902 Segmental and somatic dysfunction of thoracic region: Secondary | ICD-10-CM | POA: Diagnosis not present

## 2015-09-18 NOTE — Telephone Encounter (Signed)
Called spoke with pt. Reviewed the lung cancer screening program and she does qualify. Scheduled SDMV on 8/2 with SG. CT order placed. Pt voiced understanding and had no further questions. Nothing further needed.

## 2015-09-24 ENCOUNTER — Other Ambulatory Visit: Payer: Self-pay | Admitting: Gastroenterology

## 2015-09-24 DIAGNOSIS — Z1211 Encounter for screening for malignant neoplasm of colon: Secondary | ICD-10-CM | POA: Diagnosis not present

## 2015-09-24 DIAGNOSIS — D122 Benign neoplasm of ascending colon: Secondary | ICD-10-CM | POA: Diagnosis not present

## 2015-09-24 DIAGNOSIS — K621 Rectal polyp: Secondary | ICD-10-CM | POA: Diagnosis not present

## 2015-09-24 DIAGNOSIS — K644 Residual hemorrhoidal skin tags: Secondary | ICD-10-CM | POA: Diagnosis not present

## 2015-09-24 DIAGNOSIS — K5730 Diverticulosis of large intestine without perforation or abscess without bleeding: Secondary | ICD-10-CM | POA: Diagnosis not present

## 2015-10-03 NOTE — Telephone Encounter (Signed)
Referral Notes     Type Date User   General 09/18/2015 5:03 PM [name] P        Note   Called spoke with pt. Reviewed the lung cancer screening program and she does qualify. Scheduled SDMV on 8/2 with SG. CT order placed. Pt voiced understanding and had no further questions. Nothing further needed.      Nothing further is needed.

## 2015-10-15 ENCOUNTER — Encounter: Payer: Self-pay | Admitting: Acute Care

## 2015-10-15 ENCOUNTER — Ambulatory Visit (INDEPENDENT_AMBULATORY_CARE_PROVIDER_SITE_OTHER): Payer: Medicare Other | Admitting: Acute Care

## 2015-10-15 ENCOUNTER — Ambulatory Visit (INDEPENDENT_AMBULATORY_CARE_PROVIDER_SITE_OTHER)
Admission: RE | Admit: 2015-10-15 | Discharge: 2015-10-15 | Disposition: A | Discharge status: Home / Self Care | Payer: Medicare Other | Source: Ambulatory Visit | Attending: Acute Care | Admitting: Acute Care

## 2015-10-15 DIAGNOSIS — F17210 Nicotine dependence, cigarettes, uncomplicated: Secondary | ICD-10-CM

## 2015-10-15 NOTE — Progress Notes (Signed)
Shared Decision Making Visit Lung Cancer Screening Program [phone])   Eligibility:  Age 70 y.o.  Pack Years Smoking History Calculation 53 pack years (# packs/per year x # years smoked)  Recent History of coughing up blood  No  Unexplained weight loss? No ( >Than 15 pounds within the last 6 months )  Prior History Lung / other cancer no (Diagnosis within the last 5 years already requiring surveillance chest CT Scans).  Smoking Status Current Smoker  Former Smokers: Years since quit: NA  Quit Date: NA  Visit Components:  Discussion included one or more decision making aids. yes  Discussion included risk/benefits of screening. yes  Discussion included potential follow up diagnostic testing for abnormal scans. yes  Discussion included meaning and risk of over diagnosis. yes  Discussion included meaning and risk of False Positives. yes  Discussion included meaning of total radiation exposure. yes  Counseling Included:  Importance of adherence to annual lung cancer LDCT screening. yes  Impact of comorbidities on ability to participate in the program. yes  Ability and willingness to under diagnostic treatment. yes  Smoking Cessation Counseling:  Current Smokers:   Discussed importance of smoking cessation. yes  Information about tobacco cessation classes and interventions provided to patient. yes  Patient provided with "ticket" for LDCT Scan. yes  Symptomatic Patient. no  Counseling  Diagnosis Code: Tobacco Use Z72.0  Asymptomatic Patient yes  Counseling (Intermediate counseling: > three minutes counseling) [id number]  Former Smokers:   Discussed the importance of maintaining cigarette abstinence. yes  Diagnosis Code: Personal History of Nicotine Dependence. [id number]  Information about tobacco cessation classes and interventions provided to patient. Yes  Patient provided with "ticket" for LDCT Scan. yes  Written Order for Lung Cancer Screening with LDCT  placed in Epic. Yes (CT Chest Lung Cancer Screening Low Dose W/O CM) [id number] Z12.2-Screening of respiratory organs Z87.891-Personal history of nicotine dependence  I have spent 20 minutes of face to face time with Mrs. Earhart discussing the risks and benefits of lung cancer screening. We viewed a power point together that explained in detail the above noted topics. We paused at intervals to allow for questions to be asked and answered to ensure understanding.We discussed that the single most powerful action that she can take to decrease her risk of developing lung cancer is to quit smoking. We discussed whether or not she is ready to commit to setting a quit date. She is currently not ready to set a quit date. We discussed options for tools to aid in quitting smoking including nicotine replacement therapy, non-nicotine medications, support groups, Quit Smart classes, and behavior modification. We discussed that often times setting smaller, more achievable goals, such as eliminating 1 cigarette a day for a week and then 2 cigarettes a day for a week can be helpful in slowly decreasing the number of cigarettes smoked. This allows for a sense of accomplishment as well as providing a clinical benefit. I gave her the " Be Stronger Than Your Excuses" card with contact information for community resources, classes, free nicotine replacement therapy, and access to mobile apps, text messaging, and on-line smoking cessation help. I have also given her my card and contact information in the event she needs to contact me. We discussed the time and location of the scan, and that either June Leap, CMA, or I will call with the results within 24-48 hours of receiving them. I have provided her with a copy of the power point we viewed  as a  resource in the event they need reinforcement of the concepts we discussed today in the office. The patient verbalized understanding of all of  the above and had no further questions upon  leaving the office. They have my contact information in the event they have any further questions.  Magdalen Spatz, NP 10/15/2015

## 2015-10-16 ENCOUNTER — Telehealth: Payer: Self-pay | Admitting: Acute Care

## 2015-10-16 DIAGNOSIS — M5416 Radiculopathy, lumbar region: Secondary | ICD-10-CM | POA: Diagnosis not present

## 2015-10-16 DIAGNOSIS — M4300 Spondylolysis, site unspecified: Secondary | ICD-10-CM | POA: Diagnosis not present

## 2015-10-16 DIAGNOSIS — M9903 Segmental and somatic dysfunction of lumbar region: Secondary | ICD-10-CM | POA: Diagnosis not present

## 2015-10-16 DIAGNOSIS — M9904 Segmental and somatic dysfunction of sacral region: Secondary | ICD-10-CM | POA: Diagnosis not present

## 2015-10-16 DIAGNOSIS — M9902 Segmental and somatic dysfunction of thoracic region: Secondary | ICD-10-CM | POA: Diagnosis not present

## 2015-10-16 DIAGNOSIS — M5136 Other intervertebral disc degeneration, lumbar region: Secondary | ICD-10-CM | POA: Diagnosis not present

## 2015-10-16 NOTE — Telephone Encounter (Signed)
I attempted to call the patient with her screening scan results.There was no answer at her home number, and no option to leave a message. I called her cell phone which was not answered, but there was an option to leave a voice mail, which I did requesting that Kristin Williams call me back for her screening results. I left the office number and contact information. I will attempt again in the morning if I have not received a return call from her by then.

## 2015-10-17 ENCOUNTER — Telehealth: Payer: Self-pay | Admitting: Acute Care

## 2015-10-17 ENCOUNTER — Other Ambulatory Visit: Payer: Self-pay | Admitting: Acute Care

## 2015-10-17 DIAGNOSIS — F17210 Nicotine dependence, cigarettes, uncomplicated: Principal | ICD-10-CM

## 2015-10-17 NOTE — Telephone Encounter (Signed)
I have called Kristin Williams with the results of her low dose CT scan. We  Discussed that her scan was read as a Lung RADS 4 A : suspicious findings, either short term follow up in 3 months or alternatively  PET Scan evaluation may be considered when there is a solid component of  8 mm or larger. I told her that I had reviewed the scan with Dr. Halford Chessman and he felt waiting for 3 months was the best plan as we move forward. When I offered Kristin Williams a PET scan now, or a repeat CT in 3 months, she opted for the repeat scan in 3 months. I will schedule her for a follow up scan in the first week of November. She verbalized understanding of the above and had no further questions upon ending the call. She has my contact information in the event she has any further questions.

## 2015-10-27 DIAGNOSIS — R911 Solitary pulmonary nodule: Secondary | ICD-10-CM | POA: Diagnosis not present

## 2015-11-04 ENCOUNTER — Other Ambulatory Visit: Payer: Self-pay | Admitting: Obstetrics and Gynecology

## 2015-11-04 DIAGNOSIS — Z853 Personal history of malignant neoplasm of breast: Secondary | ICD-10-CM

## 2015-11-04 DIAGNOSIS — Z01419 Encounter for gynecological examination (general) (routine) without abnormal findings: Secondary | ICD-10-CM | POA: Diagnosis not present

## 2015-11-04 DIAGNOSIS — Z6841 Body Mass Index (BMI) 40.0 and over, adult: Secondary | ICD-10-CM | POA: Diagnosis not present

## 2015-11-04 DIAGNOSIS — N6451 Induration of breast: Secondary | ICD-10-CM

## 2015-11-05 DIAGNOSIS — G4733 Obstructive sleep apnea (adult) (pediatric): Secondary | ICD-10-CM | POA: Diagnosis not present

## 2015-11-10 ENCOUNTER — Ambulatory Visit
Admission: RE | Admit: 2015-11-10 | Discharge: 2015-11-10 | Disposition: A | Discharge status: Home / Self Care | Payer: Medicare Other | Source: Ambulatory Visit | Attending: Obstetrics and Gynecology | Admitting: Obstetrics and Gynecology

## 2015-11-10 DIAGNOSIS — R928 Other abnormal and inconclusive findings on diagnostic imaging of breast: Secondary | ICD-10-CM | POA: Diagnosis not present

## 2015-11-10 DIAGNOSIS — Z853 Personal history of malignant neoplasm of breast: Secondary | ICD-10-CM

## 2015-11-10 DIAGNOSIS — N6451 Induration of breast: Secondary | ICD-10-CM

## 2015-11-14 DIAGNOSIS — M5416 Radiculopathy, lumbar region: Secondary | ICD-10-CM | POA: Diagnosis not present

## 2015-11-14 DIAGNOSIS — M9902 Segmental and somatic dysfunction of thoracic region: Secondary | ICD-10-CM | POA: Diagnosis not present

## 2015-11-14 DIAGNOSIS — M5136 Other intervertebral disc degeneration, lumbar region: Secondary | ICD-10-CM | POA: Diagnosis not present

## 2015-11-14 DIAGNOSIS — M4300 Spondylolysis, site unspecified: Secondary | ICD-10-CM | POA: Diagnosis not present

## 2015-11-14 DIAGNOSIS — M9904 Segmental and somatic dysfunction of sacral region: Secondary | ICD-10-CM | POA: Diagnosis not present

## 2015-11-14 DIAGNOSIS — M9903 Segmental and somatic dysfunction of lumbar region: Secondary | ICD-10-CM | POA: Diagnosis not present

## 2015-11-15 DIAGNOSIS — Z23 Encounter for immunization: Secondary | ICD-10-CM | POA: Diagnosis not present

## 2015-11-24 ENCOUNTER — Other Ambulatory Visit: Payer: Self-pay | Admitting: Obstetrics and Gynecology

## 2015-11-24 DIAGNOSIS — N63 Unspecified lump in breast: Secondary | ICD-10-CM | POA: Diagnosis not present

## 2015-11-24 DIAGNOSIS — Z1231 Encounter for screening mammogram for malignant neoplasm of breast: Secondary | ICD-10-CM

## 2015-11-24 DIAGNOSIS — Z853 Personal history of malignant neoplasm of breast: Secondary | ICD-10-CM | POA: Diagnosis not present

## 2015-12-05 DIAGNOSIS — M5136 Other intervertebral disc degeneration, lumbar region: Secondary | ICD-10-CM | POA: Diagnosis not present

## 2015-12-05 DIAGNOSIS — M4300 Spondylolysis, site unspecified: Secondary | ICD-10-CM | POA: Diagnosis not present

## 2015-12-05 DIAGNOSIS — M9904 Segmental and somatic dysfunction of sacral region: Secondary | ICD-10-CM | POA: Diagnosis not present

## 2015-12-05 DIAGNOSIS — M9902 Segmental and somatic dysfunction of thoracic region: Secondary | ICD-10-CM | POA: Diagnosis not present

## 2015-12-05 DIAGNOSIS — M5416 Radiculopathy, lumbar region: Secondary | ICD-10-CM | POA: Diagnosis not present

## 2015-12-05 DIAGNOSIS — M9903 Segmental and somatic dysfunction of lumbar region: Secondary | ICD-10-CM | POA: Diagnosis not present

## 2015-12-16 ENCOUNTER — Other Ambulatory Visit: Payer: Self-pay | Admitting: Acute Care

## 2015-12-16 DIAGNOSIS — F17210 Nicotine dependence, cigarettes, uncomplicated: Principal | ICD-10-CM

## 2015-12-18 ENCOUNTER — Ambulatory Visit
Admission: RE | Admit: 2015-12-18 | Discharge: 2015-12-18 | Disposition: A | Discharge status: Home / Self Care | Payer: Medicare Other | Source: Ambulatory Visit | Attending: Obstetrics and Gynecology | Admitting: Obstetrics and Gynecology

## 2015-12-18 DIAGNOSIS — Z1231 Encounter for screening mammogram for malignant neoplasm of breast: Secondary | ICD-10-CM

## 2016-01-02 DIAGNOSIS — M4300 Spondylolysis, site unspecified: Secondary | ICD-10-CM | POA: Diagnosis not present

## 2016-01-02 DIAGNOSIS — M9902 Segmental and somatic dysfunction of thoracic region: Secondary | ICD-10-CM | POA: Diagnosis not present

## 2016-01-02 DIAGNOSIS — M9903 Segmental and somatic dysfunction of lumbar region: Secondary | ICD-10-CM | POA: Diagnosis not present

## 2016-01-02 DIAGNOSIS — M9904 Segmental and somatic dysfunction of sacral region: Secondary | ICD-10-CM | POA: Diagnosis not present

## 2016-01-02 DIAGNOSIS — M5416 Radiculopathy, lumbar region: Secondary | ICD-10-CM | POA: Diagnosis not present

## 2016-01-02 DIAGNOSIS — M5136 Other intervertebral disc degeneration, lumbar region: Secondary | ICD-10-CM | POA: Diagnosis not present

## 2016-01-15 ENCOUNTER — Other Ambulatory Visit: Payer: Self-pay | Admitting: Acute Care

## 2016-01-15 ENCOUNTER — Ambulatory Visit (HOSPITAL_COMMUNITY)
Admission: RE | Admit: 2016-01-15 | Discharge: 2016-01-15 | Disposition: A | Discharge status: Home / Self Care | Payer: Medicare Other | Source: Ambulatory Visit | Attending: Acute Care | Admitting: Acute Care

## 2016-01-15 DIAGNOSIS — J432 Centrilobular emphysema: Secondary | ICD-10-CM | POA: Insufficient documentation

## 2016-01-15 DIAGNOSIS — F17210 Nicotine dependence, cigarettes, uncomplicated: Principal | ICD-10-CM

## 2016-01-15 DIAGNOSIS — K760 Fatty (change of) liver, not elsewhere classified: Secondary | ICD-10-CM | POA: Diagnosis not present

## 2016-01-15 DIAGNOSIS — Z87891 Personal history of nicotine dependence: Secondary | ICD-10-CM | POA: Diagnosis not present

## 2016-01-15 DIAGNOSIS — R911 Solitary pulmonary nodule: Secondary | ICD-10-CM | POA: Insufficient documentation

## 2016-01-15 DIAGNOSIS — I700 Atherosclerosis of aorta: Secondary | ICD-10-CM | POA: Diagnosis not present

## 2016-01-15 DIAGNOSIS — R918 Other nonspecific abnormal finding of lung field: Secondary | ICD-10-CM | POA: Diagnosis not present

## 2016-01-16 ENCOUNTER — Telehealth: Payer: Self-pay | Admitting: Acute Care

## 2016-01-16 DIAGNOSIS — F17210 Nicotine dependence, cigarettes, uncomplicated: Secondary | ICD-10-CM

## 2016-01-16 NOTE — Telephone Encounter (Signed)
I have called Mrs. Kristin Williams with the results of her three-month follow-up CT. I explained that her scan was read as a lung RADS category 2 indicating Lung RADS 2: nodules that are benign in appearance and behavior with a very low likelihood of becoming a clinically active cancer due to size or lack of growth. Recommendation per radiology is for a repeat LDCT in 12 months. I told her we will order and schedule her follow-up scan for November 2018. I also told her there was the finding of three-vessel coronary artery atherosclerosis, aortic atherosclerosis, and mild emphysema. I explained that this is a non-gated exam and degree and severity of atherosclerosis cannot be determined. She is currently taking Lipitor per her primary care provider. I told her I will send a copy of this to her PCP for completeness of her medical record. She verbalized understanding of the above and had no further questions.  She has my contact information in the event she has any future questions

## 2016-01-23 DIAGNOSIS — M5136 Other intervertebral disc degeneration, lumbar region: Secondary | ICD-10-CM | POA: Diagnosis not present

## 2016-01-23 DIAGNOSIS — M9902 Segmental and somatic dysfunction of thoracic region: Secondary | ICD-10-CM | POA: Diagnosis not present

## 2016-01-23 DIAGNOSIS — M5416 Radiculopathy, lumbar region: Secondary | ICD-10-CM | POA: Diagnosis not present

## 2016-01-23 DIAGNOSIS — M9904 Segmental and somatic dysfunction of sacral region: Secondary | ICD-10-CM | POA: Diagnosis not present

## 2016-01-23 DIAGNOSIS — M9903 Segmental and somatic dysfunction of lumbar region: Secondary | ICD-10-CM | POA: Diagnosis not present

## 2016-01-23 DIAGNOSIS — M4300 Spondylolysis, site unspecified: Secondary | ICD-10-CM | POA: Diagnosis not present

## 2016-02-23 DIAGNOSIS — M5416 Radiculopathy, lumbar region: Secondary | ICD-10-CM | POA: Diagnosis not present

## 2016-02-23 DIAGNOSIS — M9904 Segmental and somatic dysfunction of sacral region: Secondary | ICD-10-CM | POA: Diagnosis not present

## 2016-02-23 DIAGNOSIS — M9903 Segmental and somatic dysfunction of lumbar region: Secondary | ICD-10-CM | POA: Diagnosis not present

## 2016-02-23 DIAGNOSIS — M5136 Other intervertebral disc degeneration, lumbar region: Secondary | ICD-10-CM | POA: Diagnosis not present

## 2016-02-23 DIAGNOSIS — M4300 Spondylolysis, site unspecified: Secondary | ICD-10-CM | POA: Diagnosis not present

## 2016-02-23 DIAGNOSIS — M9902 Segmental and somatic dysfunction of thoracic region: Secondary | ICD-10-CM | POA: Diagnosis not present

## 2016-03-19 DIAGNOSIS — M9904 Segmental and somatic dysfunction of sacral region: Secondary | ICD-10-CM | POA: Diagnosis not present

## 2016-03-19 DIAGNOSIS — M5136 Other intervertebral disc degeneration, lumbar region: Secondary | ICD-10-CM | POA: Diagnosis not present

## 2016-03-19 DIAGNOSIS — M9903 Segmental and somatic dysfunction of lumbar region: Secondary | ICD-10-CM | POA: Diagnosis not present

## 2016-03-19 DIAGNOSIS — M9902 Segmental and somatic dysfunction of thoracic region: Secondary | ICD-10-CM | POA: Diagnosis not present

## 2016-03-19 DIAGNOSIS — M5416 Radiculopathy, lumbar region: Secondary | ICD-10-CM | POA: Diagnosis not present

## 2016-03-19 DIAGNOSIS — M4300 Spondylolysis, site unspecified: Secondary | ICD-10-CM | POA: Diagnosis not present

## 2016-03-25 DIAGNOSIS — Z961 Presence of intraocular lens: Secondary | ICD-10-CM | POA: Diagnosis not present

## 2016-03-25 DIAGNOSIS — H35371 Puckering of macula, right eye: Secondary | ICD-10-CM | POA: Diagnosis not present

## 2016-04-09 DIAGNOSIS — M5136 Other intervertebral disc degeneration, lumbar region: Secondary | ICD-10-CM | POA: Diagnosis not present

## 2016-04-09 DIAGNOSIS — M5416 Radiculopathy, lumbar region: Secondary | ICD-10-CM | POA: Diagnosis not present

## 2016-04-09 DIAGNOSIS — M9903 Segmental and somatic dysfunction of lumbar region: Secondary | ICD-10-CM | POA: Diagnosis not present

## 2016-04-09 DIAGNOSIS — M9902 Segmental and somatic dysfunction of thoracic region: Secondary | ICD-10-CM | POA: Diagnosis not present

## 2016-04-09 DIAGNOSIS — M9904 Segmental and somatic dysfunction of sacral region: Secondary | ICD-10-CM | POA: Diagnosis not present

## 2016-04-09 DIAGNOSIS — M4300 Spondylolysis, site unspecified: Secondary | ICD-10-CM | POA: Diagnosis not present

## 2016-04-12 ENCOUNTER — Encounter: Payer: Self-pay | Admitting: Family

## 2016-04-14 ENCOUNTER — Encounter (HOSPITAL_COMMUNITY): Payer: Medicare Other

## 2016-04-14 ENCOUNTER — Ambulatory Visit: Payer: Medicare Other | Admitting: Family

## 2016-04-30 DIAGNOSIS — M5136 Other intervertebral disc degeneration, lumbar region: Secondary | ICD-10-CM | POA: Diagnosis not present

## 2016-04-30 DIAGNOSIS — M9904 Segmental and somatic dysfunction of sacral region: Secondary | ICD-10-CM | POA: Diagnosis not present

## 2016-04-30 DIAGNOSIS — M5416 Radiculopathy, lumbar region: Secondary | ICD-10-CM | POA: Diagnosis not present

## 2016-04-30 DIAGNOSIS — M9902 Segmental and somatic dysfunction of thoracic region: Secondary | ICD-10-CM | POA: Diagnosis not present

## 2016-04-30 DIAGNOSIS — M9903 Segmental and somatic dysfunction of lumbar region: Secondary | ICD-10-CM | POA: Diagnosis not present

## 2016-04-30 DIAGNOSIS — M4300 Spondylolysis, site unspecified: Secondary | ICD-10-CM | POA: Diagnosis not present

## 2016-05-28 ENCOUNTER — Ambulatory Visit: Payer: Medicare Other | Admitting: Family

## 2016-05-28 ENCOUNTER — Encounter (HOSPITAL_COMMUNITY): Payer: Medicare Other

## 2016-06-15 ENCOUNTER — Encounter: Payer: Self-pay | Admitting: Family

## 2016-06-23 ENCOUNTER — Ambulatory Visit: Payer: Medicare Other | Admitting: Family

## 2016-06-23 ENCOUNTER — Encounter (HOSPITAL_COMMUNITY): Payer: Medicare Other

## 2016-06-30 DIAGNOSIS — M8580 Other specified disorders of bone density and structure, unspecified site: Secondary | ICD-10-CM | POA: Diagnosis not present

## 2016-06-30 DIAGNOSIS — Z79899 Other long term (current) drug therapy: Secondary | ICD-10-CM | POA: Diagnosis not present

## 2016-06-30 DIAGNOSIS — K219 Gastro-esophageal reflux disease without esophagitis: Secondary | ICD-10-CM | POA: Diagnosis not present

## 2016-06-30 DIAGNOSIS — I739 Peripheral vascular disease, unspecified: Secondary | ICD-10-CM | POA: Diagnosis not present

## 2016-06-30 DIAGNOSIS — R911 Solitary pulmonary nodule: Secondary | ICD-10-CM | POA: Diagnosis not present

## 2016-06-30 DIAGNOSIS — J449 Chronic obstructive pulmonary disease, unspecified: Secondary | ICD-10-CM | POA: Diagnosis not present

## 2016-06-30 DIAGNOSIS — E7800 Pure hypercholesterolemia, unspecified: Secondary | ICD-10-CM | POA: Diagnosis not present

## 2016-06-30 DIAGNOSIS — R7303 Prediabetes: Secondary | ICD-10-CM | POA: Diagnosis not present

## 2016-06-30 DIAGNOSIS — Z0000 Encounter for general adult medical examination without abnormal findings: Secondary | ICD-10-CM | POA: Diagnosis not present

## 2016-07-27 ENCOUNTER — Encounter: Payer: Self-pay | Admitting: Family

## 2016-08-04 ENCOUNTER — Encounter: Payer: Self-pay | Admitting: Family

## 2016-08-04 ENCOUNTER — Ambulatory Visit (INDEPENDENT_AMBULATORY_CARE_PROVIDER_SITE_OTHER): Payer: Medicare Other | Admitting: Family

## 2016-08-04 ENCOUNTER — Ambulatory Visit (HOSPITAL_COMMUNITY)
Admission: RE | Admit: 2016-08-04 | Discharge: 2016-08-04 | Disposition: A | Discharge status: Home / Self Care | Payer: Medicare Other | Source: Ambulatory Visit | Attending: Family | Admitting: Family

## 2016-08-04 VITALS — BP 129/73 | HR 69 | Temp 98.5°F | Resp 18 | Ht 62.0 in | Wt 236.0 lb

## 2016-08-04 DIAGNOSIS — I771 Stricture of artery: Secondary | ICD-10-CM

## 2016-08-04 DIAGNOSIS — Z95828 Presence of other vascular implants and grafts: Secondary | ICD-10-CM

## 2016-08-04 DIAGNOSIS — F17200 Nicotine dependence, unspecified, uncomplicated: Secondary | ICD-10-CM | POA: Diagnosis not present

## 2016-08-04 NOTE — Progress Notes (Signed)
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 female who had a PTA and stenting of the right Iliac artery on 04/20/2004 by Dr. Scot Dock.  Before the above interventions she had just finished chemotherapy for breast cancer and developed black right great toe and pain in that toe.   She was lost to follow up for a year, returns for follow up today.   She does have low back issues with DDD and arthritis and is seeing a chiropractor who is helping with sciatic pain. She states she gets the pain when she stands too long or walks a long distance but denies claudication type symptoms.   She is exercising daily with stationary bike, states she feels great with exercising, her back feels better also. Pt. denies history of stroke or TIA, denies non healing ulcers on lower extremities.  She is using C-PAP and feels refreshed after sleeping.   Pt Diabetic: yes, pt states her April 2018 A1C was 6.1 Pt smoker: smoker (less than1 ppd, started smoking at age 35 yrs); states she is working on trying to quit   Pt meds include:  Statin :Yes  Betablocker: No  ASA: Yes  Other anticoagulants/antiplatelets: no     Past Medical History:  Diagnosis Date  . Arthritis   . Cancer ([place])    Cervical  . Cancer ([place])    Breast  . 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 COPD (chronic obstructive pulmonary disease) ([place])   . Headache(784.0)   . Hyperlipidemia   . Leg pain   . Peripheral vascular disease [facility])     Social History Social History  Substance Use Topics  . Smoking status: Light Tobacco Smoker    Packs/day: 1.00    Years: 40.00    Types: Cigarettes, E-cigarettes  . Smokeless tobacco: Never Used  . Alcohol use No    Family History Family History  Problem Relation Age of Onset  . Cancer Mother        Non-Hodgkins Disease T-Cell  . Heart failure Father   . COPD Father   . Heart disease Brother  27       MI and Heart Disease before age 73  . Heart attack Brother     Past Surgical History:  Procedure Laterality Date  . ABDOMINAL HYSTERECTOMY  1975  . BREAST LUMPECTOMY  2005  . GANGLION CYST EXCISION  1977  . ILIAC ARTERY STENT  04/20/2004   CSD right iliac occlusive disease    No Known Allergies  Current Outpatient Prescriptions  Medication Sig Dispense Refill  . albuterol (PROVENTIL HFA;VENTOLIN HFA) 108 (90 BASE) MCG/ACT inhaler Inhale 2 puffs into the lungs as needed for wheezing or shortness of breath.    Marland Kitchen albuterol (PROVENTIL) (2.5 MG/3ML) 0.083% nebulizer solution Take 2.5 mg by nebulization every 6 (six) hours as needed for wheezing.    Marland Kitchen aspirin 81 MG EC tablet Take 81 mg by mouth daily.      Marland Kitchen atorvastatin (LIPITOR) 10 MG tablet Take 10 mg by mouth daily.      . Calcium Carbonate-Vitamin D (CALCIUM + D PO) Take by mouth 2 (two) times daily.      . Cholecalciferol (VITAMIN D3) 3000 UNITS TABS Take 2,000 mg by mouth daily.    Marland Kitchen CRANBERRY FRUIT PO Take 84 mg by mouth daily.    . fish oil-omega-3 fatty acids 1000 MG capsule  Take 1,000 mg by mouth 2 (two) times daily.    . fluorouracil (EFUDEX) 5 % cream     . Multiple Vitamin (MULTIVITAMIN PO) Take by mouth daily.      Marland Kitchen omeprazole (PRILOSEC) 20 MG capsule Take 20 mg by mouth daily.     Marland Kitchen alendronate (FOSAMAX) 35 MG tablet Take 35 mg by mouth every 7 (seven) days.     . naproxen sodium (ANAPROX) 220 MG tablet Take 220 mg by mouth 2 (two) times daily with a meal.     No current facility-administered medications for this visit.     ROS: See HPI for pertinent positives and negatives.   Physical Examination  Vitals:   08/04/16 1213  BP: 129/73  Pulse: 69  Resp: 18  Temp: 98.5 F (36.9 C)  TempSrc: Oral  SpO2: 96%  Weight: 236 lb (107 kg)  Height: 5\' 2"  (1.575 m)   Body mass index is 43.16 kg/m.  General: A&O x 3, WDWN, morbidly obese female.  Gait: normal  Eyes: Pupils equal Pulmonary: Respirations  are non labored, distant breaths sounds with limited air movement, no rales or rhonchi, few wheezes, + moist cough  Cardiac: regular rhythm and rate, no appreciable murmur  Carotid Bruits  Left  Right    Negative  Negative    Abdominal aorta is not palpable.  Radial pulses are palpable at 2+.   VASCULAR EXAM:  Extremities without ischemic changes; without Gangrene; without open wounds.   LE Pulses  LEFT  RIGHT   FEMORAL  not palpable due to large panus and body habitus  not palpable due to large panus and body habitus   POPLITEAL  not palpable  not palpable   POSTERIOR TIBIAL  2+palpable  3+palpable   DORSALIS PEDIS  ANTERIOR TIBIAL  2+palpable  1+palpable    Abdomen: soft, NT, no palpable masses, large panus.  Skin: no rashes, see Extremities.  Musculoskeletal: no muscle wasting or atrophy  Neurologic: A&O X 3; Appropriate Affect ; SENSATION: normal; MOTOR FUNCTION: moving all extremities equally. Speech is fluent/normal. Motor strength 5/5 throughout.    Non-Invasive Vascular Imaging: DATE: 08/04/2016  ABI:   R:   ABI: 1.18 (1.09 on 04-11-15),   PT: tri  DP: bi  TBI:  0.79  L:   ABI: 1.34 (1.13),   PT: tri  DP: tri  TBI: 0.79    ASSESSMENT: Kristin Williams is a 71 y.o. female who is s/p PTA and stenting of the right Iliac artery on 04/20/2004. She has no claudication symptoms with walking. There are no signs of ischemia in her feet or legs.  Bilateral ABI remains normal with tri and biphasic waveforms.   Her atherosclerotic risk factors include active smoking x 54 years, well controlled DM, morbid obesity, history of breast and cervical cancer, and COPD.    PLAN:  The patient was counseled re smoking cessation and given several free resources re smoking cessation.  I advised her to exercise her legs at least a total of 30 minutes daily.    Based on the patient's vascular studies and examination, pt will return to clinic in 1 year for ABI's.  I  discussed in depth with the patient the nature of atherosclerosis, and emphasized the importance of maximal medical management including strict control of blood pressure, blood glucose, and lipid levels, obtaining regular exercise, and cessation of smoking.  The patient is aware that without maximal medical management the underlying atherosclerotic disease process will progress, limiting the  benefit of any interventions.  The patient was given information about PAD including signs, symptoms, treatment, what symptoms should prompt the patient to seek immediate medical care, and risk reduction measures to take.  Clemon Chambers, RN, MSN, FNP-C [facility] Phone: [phone]  Clinic MD: Scot Dock  08/04/16 12:15 PM

## 2016-08-04 NOTE — Patient Instructions (Signed)
Peripheral Vascular Disease Peripheral vascular disease (PVD) is a disease of the blood vessels that are not part of your heart and brain. A simple term for PVD is poor circulation. In most cases, PVD narrows the blood vessels that carry blood from your heart to the rest of your body. This can result in a decreased supply of blood to your arms, legs, and internal organs, like your stomach or kidneys. However, it most often affects a person's lower legs and feet. There are two types of PVD.  Organic PVD. This is the more common type. It is caused by damage to the structure of blood vessels.  Functional PVD. This is caused by conditions that make blood vessels contract and tighten (spasm). Without treatment, PVD tends to get worse over time. PVD can also lead to acute ischemic limb. This is when an arm or limb suddenly has trouble getting enough blood. This is a medical emergency. Follow these instructions at home:  Take medicines only as told by your doctor.  Do not use any tobacco products, including cigarettes, chewing tobacco, or electronic cigarettes. If you need help quitting, ask your doctor.  Lose weight if you are overweight, and maintain a healthy weight as told by your doctor.  Eat a diet that is low in fat and cholesterol. If you need help, ask your doctor.  Exercise regularly. Ask your doctor for some good activities for you.  Take good care of your feet.  Wear comfortable shoes that fit well.  Check your feet often for any cuts or sores. Contact a doctor if:  You have cramps in your legs while walking.  You have leg pain when you are at rest.  You have coldness in a leg or foot.  Your skin changes.  You are unable to get or have an erection (erectile dysfunction).  You have cuts or sores on your feet that are not healing. Get help right away if:  Your arm or leg turns cold and blue.  Your arms or legs become red, warm, swollen, painful, or numb.  You have  chest pain or trouble breathing.  You suddenly have weakness in your face, arm, or leg.  You become very confused or you cannot speak.  You suddenly have a very bad headache.  You suddenly cannot see. This information is not intended to replace advice given to you by your health care provider. Make sure you discuss any questions you have with your health care provider. Document Released: 05/26/2009 Document Revised: 08/07/2015 Document Reviewed: 08/09/2013 Elsevier Interactive Patient Education  2017 Elsevier Inc.      Steps to Quit Smoking Smoking tobacco can be bad for your health. It can also affect almost every organ in your body. Smoking puts you and people around you at risk for many serious long-lasting (chronic) diseases. Quitting smoking is hard, but it is one of the best things that you can do for your health. It is never too late to quit. What are the benefits of quitting smoking? When you quit smoking, you lower your risk for getting serious diseases and conditions. They can include:  Lung cancer or lung disease.  Heart disease.  Stroke.  Heart attack.  Not being able to have children (infertility).  Weak bones (osteoporosis) and broken bones (fractures). If you have coughing, wheezing, and shortness of breath, those symptoms may get better when you quit. You may also get sick less often. If you are pregnant, quitting smoking can help to lower your chances   of having a baby of low birth weight. What can I do to help me quit smoking? Talk with your doctor about what can help you quit smoking. Some things you can do (strategies) include:  Quitting smoking totally, instead of slowly cutting back how much you smoke over a period of time.  Going to in-person counseling. You are more likely to quit if you go to many counseling sessions.  Using resources and support systems, such as:  Online chats with a counselor.  Phone quitlines.  Printed self-help  materials.  Support groups or group counseling.  Text messaging programs.  Mobile phone apps or applications.  Taking medicines. Some of these medicines may have nicotine in them. If you are pregnant or breastfeeding, do not take any medicines to quit smoking unless your doctor says it is okay. Talk with your doctor about counseling or other things that can help you. Talk with your doctor about using more than one strategy at the same time, such as taking medicines while you are also going to in-person counseling. This can help make quitting easier. What things can I do to make it easier to quit? Quitting smoking might feel very hard at first, but there is a lot that you can do to make it easier. Take these steps:  Talk to your family and friends. Ask them to support and encourage you.  Call phone quitlines, reach out to support groups, or work with a counselor.  Ask people who smoke to not smoke around you.  Avoid places that make you want (trigger) to smoke, such as:  Bars.  Parties.  Smoke-break areas at work.  Spend time with people who do not smoke.  Lower the stress in your life. Stress can make you want to smoke. Try these things to help your stress:  Getting regular exercise.  Deep-breathing exercises.  Yoga.  Meditating.  Doing a body scan. To do this, close your eyes, focus on one area of your body at a time from head to toe, and notice which parts of your body are tense. Try to relax the muscles in those areas.  Download or buy apps on your mobile phone or tablet that can help you stick to your quit plan. There are many free apps, such as QuitGuide from the CDC (Centers for Disease Control and Prevention). You can find more support from smokefree.gov and other websites. This information is not intended to replace advice given to you by your health care provider. Make sure you discuss any questions you have with your health care provider. Document Released:  12/26/2008 Document Revised: 10/28/2015 Document Reviewed: 07/16/2014 Elsevier Interactive Patient Education  2017 Elsevier Inc.  

## 2016-08-04 NOTE — Addendum Note (Signed)
Addended by: Lianne Cure A on: 08/04/2016 04:21 PM   Modules accepted: Orders

## 2016-09-16 DIAGNOSIS — L570 Actinic keratosis: Secondary | ICD-10-CM | POA: Diagnosis not present

## 2016-09-16 DIAGNOSIS — D485 Neoplasm of uncertain behavior of skin: Secondary | ICD-10-CM | POA: Diagnosis not present

## 2016-09-16 DIAGNOSIS — Z85828 Personal history of other malignant neoplasm of skin: Secondary | ICD-10-CM | POA: Diagnosis not present

## 2016-09-16 DIAGNOSIS — L821 Other seborrheic keratosis: Secondary | ICD-10-CM | POA: Diagnosis not present

## 2016-11-08 DIAGNOSIS — G4733 Obstructive sleep apnea (adult) (pediatric): Secondary | ICD-10-CM | POA: Diagnosis not present

## 2016-11-18 DIAGNOSIS — Z1272 Encounter for screening for malignant neoplasm of vagina: Secondary | ICD-10-CM | POA: Diagnosis not present

## 2016-11-18 DIAGNOSIS — Z6841 Body Mass Index (BMI) 40.0 and over, adult: Secondary | ICD-10-CM | POA: Diagnosis not present

## 2016-11-18 DIAGNOSIS — Z124 Encounter for screening for malignant neoplasm of cervix: Secondary | ICD-10-CM | POA: Diagnosis not present

## 2016-11-18 DIAGNOSIS — Z853 Personal history of malignant neoplasm of breast: Secondary | ICD-10-CM | POA: Diagnosis not present

## 2016-12-03 DIAGNOSIS — Z23 Encounter for immunization: Secondary | ICD-10-CM | POA: Diagnosis not present

## 2016-12-10 ENCOUNTER — Other Ambulatory Visit: Payer: Self-pay | Admitting: Obstetrics and Gynecology

## 2016-12-10 DIAGNOSIS — Z1231 Encounter for screening mammogram for malignant neoplasm of breast: Secondary | ICD-10-CM

## 2016-12-28 ENCOUNTER — Ambulatory Visit
Admission: RE | Admit: 2016-12-28 | Discharge: 2016-12-28 | Disposition: A | Discharge status: Home / Self Care | Payer: Medicare Other | Source: Ambulatory Visit | Attending: Obstetrics and Gynecology | Admitting: Obstetrics and Gynecology

## 2016-12-28 DIAGNOSIS — Z1231 Encounter for screening mammogram for malignant neoplasm of breast: Secondary | ICD-10-CM | POA: Diagnosis not present

## 2016-12-28 HISTORY — DX: Personal history of antineoplastic chemotherapy: Z92.21

## 2016-12-28 HISTORY — DX: Personal history of irradiation: Z92.3

## 2016-12-29 ENCOUNTER — Telehealth: Payer: Self-pay | Admitting: Acute Care

## 2016-12-29 NOTE — Telephone Encounter (Signed)
Opened in error

## 2017-01-19 ENCOUNTER — Ambulatory Visit (HOSPITAL_COMMUNITY)
Admission: RE | Admit: 2017-01-19 | Discharge: 2017-01-19 | Disposition: A | Discharge status: Home / Self Care | Payer: Medicare Other | Source: Ambulatory Visit | Attending: Acute Care | Admitting: Acute Care

## 2017-01-19 DIAGNOSIS — I700 Atherosclerosis of aorta: Secondary | ICD-10-CM | POA: Insufficient documentation

## 2017-01-19 DIAGNOSIS — J984 Other disorders of lung: Secondary | ICD-10-CM | POA: Diagnosis not present

## 2017-01-19 DIAGNOSIS — I2510 Atherosclerotic heart disease of native coronary artery without angina pectoris: Secondary | ICD-10-CM | POA: Diagnosis not present

## 2017-01-19 DIAGNOSIS — Q331 Accessory lobe of lung: Secondary | ICD-10-CM | POA: Insufficient documentation

## 2017-01-19 DIAGNOSIS — R918 Other nonspecific abnormal finding of lung field: Secondary | ICD-10-CM | POA: Insufficient documentation

## 2017-01-19 DIAGNOSIS — J432 Centrilobular emphysema: Secondary | ICD-10-CM | POA: Diagnosis not present

## 2017-01-19 DIAGNOSIS — F17210 Nicotine dependence, cigarettes, uncomplicated: Secondary | ICD-10-CM | POA: Insufficient documentation

## 2017-01-19 DIAGNOSIS — Z122 Encounter for screening for malignant neoplasm of respiratory organs: Secondary | ICD-10-CM | POA: Insufficient documentation

## 2017-01-21 DIAGNOSIS — H02839 Dermatochalasis of unspecified eye, unspecified eyelid: Secondary | ICD-10-CM | POA: Diagnosis not present

## 2017-01-21 DIAGNOSIS — H26493 Other secondary cataract, bilateral: Secondary | ICD-10-CM | POA: Diagnosis not present

## 2017-01-21 DIAGNOSIS — Z961 Presence of intraocular lens: Secondary | ICD-10-CM | POA: Diagnosis not present

## 2017-01-21 DIAGNOSIS — H26491 Other secondary cataract, right eye: Secondary | ICD-10-CM | POA: Diagnosis not present

## 2017-01-21 DIAGNOSIS — H18413 Arcus senilis, bilateral: Secondary | ICD-10-CM | POA: Diagnosis not present

## 2017-01-28 ENCOUNTER — Other Ambulatory Visit: Payer: Self-pay | Admitting: Acute Care

## 2017-01-28 DIAGNOSIS — Z122 Encounter for screening for malignant neoplasm of respiratory organs: Secondary | ICD-10-CM

## 2017-01-28 DIAGNOSIS — F17210 Nicotine dependence, cigarettes, uncomplicated: Principal | ICD-10-CM

## 2017-01-31 DIAGNOSIS — Z961 Presence of intraocular lens: Secondary | ICD-10-CM | POA: Diagnosis not present

## 2017-02-25 DIAGNOSIS — H26492 Other secondary cataract, left eye: Secondary | ICD-10-CM | POA: Diagnosis not present

## 2017-03-04 DIAGNOSIS — Z961 Presence of intraocular lens: Secondary | ICD-10-CM | POA: Diagnosis not present

## 2017-03-26 ENCOUNTER — Other Ambulatory Visit: Payer: Self-pay

## 2017-03-26 ENCOUNTER — Encounter (HOSPITAL_COMMUNITY): Payer: Self-pay | Admitting: Family Medicine

## 2017-03-26 ENCOUNTER — Telehealth (HOSPITAL_COMMUNITY): Payer: Self-pay | Admitting: Emergency Medicine

## 2017-03-26 ENCOUNTER — Ambulatory Visit (HOSPITAL_COMMUNITY)
Admission: EM | Admit: 2017-03-26 | Discharge: 2017-03-26 | Disposition: A | Discharge status: Home / Self Care | Payer: Medicare Other | Attending: Family Medicine | Admitting: Family Medicine

## 2017-03-26 DIAGNOSIS — J441 Chronic obstructive pulmonary disease with (acute) exacerbation: Secondary | ICD-10-CM | POA: Diagnosis not present

## 2017-03-26 DIAGNOSIS — J0100 Acute maxillary sinusitis, unspecified: Secondary | ICD-10-CM

## 2017-03-26 MED ORDER — AMOXICILLIN 875 MG PO TABS: 875.0000 mg | ORAL_TABLET | Freq: Two times a day (BID) | ORAL | 0 refills | Status: DC

## 2017-03-26 MED ORDER — HYDROCODONE-HOMATROPINE 5-1.5 MG/5ML PO SYRP
5.0000 mL | ORAL_SOLUTION | Freq: Four times a day (QID) | ORAL | 0 refills | Status: DC | PRN
Start: 2017-03-26 — End: 2018-01-08

## 2017-03-26 MED ORDER — PREDNISONE 20 MG PO TABS: ORAL_TABLET | ORAL | 0 refills | Status: DC

## 2017-03-26 NOTE — ED Provider Notes (Signed)
[place]   [id number] 03/26/17 Arrival Time: 5361   SUBJECTIVE:  Kristin Williams is a 72 y.o. female who presents to the urgent care with complaint of cough.  Onset a week ago.  Marked sinus congestion  H/O COPD.  No diabetes or hypertension    Past Medical History:  Diagnosis Date  . Arthritis   . Cancer ([place])    Cervical  . Cancer ([place])    Breast  . Cancer ([place])    Vaginal  . COPD (chronic obstructive pulmonary disease) ([place])   . Headache(784.0)   . Hyperlipidemia   . Leg pain   . Peripheral vascular disease ([place])   . Personal history of chemotherapy   . Personal history of radiation therapy    Family History  Problem Relation Age of Onset  . Cancer Mother        Non-Hodgkins Disease T-Cell  . Heart failure Father   . COPD Father   . Heart disease Brother 45       MI and Heart Disease before age 39  . Heart attack Brother    Social History   Socioeconomic History  . Marital status: Married    Spouse name: Not on file  . Number of children: Not on file  . Years of education: Not on file  . Highest education level: Not on file  Social Needs  . Financial resource strain: Not on file  . Food insecurity - worry: Not on file  . Food insecurity - inability: Not on file  . Transportation needs - medical: Not on file  . Transportation needs - non-medical: Not on file  Occupational History  . Not on file  Tobacco Use  . Smoking status: Light Tobacco Smoker    Packs/day: 1.00    Years: 40.00    Pack years: 40.00    Types: Cigarettes, E-cigarettes  . Smokeless tobacco: Never Used  Substance and Sexual Activity  . Alcohol use: No  . Drug use: No  . Sexual activity: Not on file  Other Topics Concern  . Not on file  Social History Narrative  . Not on file   No outpatient medications have been marked as taking for the 03/26/17 encounter [facility] Encounter).   No Known Allergies    ROS: As per HPI, remainder of ROS  negative.   OBJECTIVE:   Vitals:   03/26/17 1442  BP: 140/72  Pulse: 77  Resp: 18  Temp: 98.6 F (37 C)  TempSrc: Oral  SpO2: 96%     General appearance: alert; no distress Eyes: PERRL; EOMI; conjunctiva normal HENT: normocephalic; atraumatic; TMs normal, canal normal, external ears normal without trauma; nasal mucosa normal; oral mucosa normal Neck: supple Lungs: clear to auscultation bilaterally Heart: regular rate and rhythm Back: no CVA tenderness Extremities: no cyanosis or edema; symmetrical with no gross deformities Skin: warm and dry Neurologic: normal gait; grossly normal Psychological: alert and cooperative; normal mood and affect      Labs:  Results for orders placed or performed in visit on 09/09/14  Cytology - PAP  Result Value Ref Range   CYTOLOGY - PAP PAP RESULT     Labs Reviewed - No data to display  No results found.     ASSESSMENT & PLAN:  1. Acute maxillary sinusitis, recurrence not specified   2. COPD exacerbation ([place])     Meds ordered this encounter  Medications  . predniSONE (DELTASONE) 20 MG tablet    Sig: Two daily with  food    Dispense:  6 tablet    Refill:  0  . HYDROcodone-homatropine (HYDROMET) 5-1.5 MG/5ML syrup    Sig: Take 5 mLs by mouth every 6 (six) hours as needed for cough.    Dispense:  60 mL    Refill:  0  . amoxicillin (AMOXIL) 875 MG tablet    Sig: Take 1 tablet (875 mg total) by mouth 2 (two) times daily.    Dispense:  20 tablet    Refill:  0    Reviewed expectations re: course of current medical issues. Questions answered. Outlined signs and symptoms indicating need for more acute intervention. Patient verbalized understanding. After Visit Summary given.     Robyn Haber, MD 03/26/17 1504

## 2017-03-26 NOTE — ED Triage Notes (Signed)
Patient states this is the bronchitis symptoms.  Says tonight it will feel like something on chest and difficulty coughing up phlegm.  Patient has sinus symptoms.  Patient has been sick a week.

## 2017-07-05 DIAGNOSIS — R7303 Prediabetes: Secondary | ICD-10-CM | POA: Diagnosis not present

## 2017-07-05 DIAGNOSIS — I739 Peripheral vascular disease, unspecified: Secondary | ICD-10-CM | POA: Diagnosis not present

## 2017-07-05 DIAGNOSIS — J449 Chronic obstructive pulmonary disease, unspecified: Secondary | ICD-10-CM | POA: Diagnosis not present

## 2017-07-05 DIAGNOSIS — R42 Dizziness and giddiness: Secondary | ICD-10-CM | POA: Diagnosis not present

## 2017-07-05 DIAGNOSIS — E7800 Pure hypercholesterolemia, unspecified: Secondary | ICD-10-CM | POA: Diagnosis not present

## 2017-07-05 DIAGNOSIS — K219 Gastro-esophageal reflux disease without esophagitis: Secondary | ICD-10-CM | POA: Diagnosis not present

## 2017-07-05 DIAGNOSIS — Z0000 Encounter for general adult medical examination without abnormal findings: Secondary | ICD-10-CM | POA: Diagnosis not present

## 2017-07-05 DIAGNOSIS — Z79899 Other long term (current) drug therapy: Secondary | ICD-10-CM | POA: Diagnosis not present

## 2017-08-04 DIAGNOSIS — J441 Chronic obstructive pulmonary disease with (acute) exacerbation: Secondary | ICD-10-CM | POA: Diagnosis not present

## 2017-08-09 ENCOUNTER — Other Ambulatory Visit: Payer: Self-pay | Admitting: Family Medicine

## 2017-08-09 ENCOUNTER — Ambulatory Visit
Admission: RE | Admit: 2017-08-09 | Discharge: 2017-08-09 | Disposition: A | Discharge status: Home / Self Care | Payer: Medicare Other | Source: Ambulatory Visit | Attending: Family Medicine | Admitting: Family Medicine

## 2017-08-09 DIAGNOSIS — R059 Cough, unspecified: Secondary | ICD-10-CM

## 2017-08-09 DIAGNOSIS — J441 Chronic obstructive pulmonary disease with (acute) exacerbation: Secondary | ICD-10-CM

## 2017-08-09 DIAGNOSIS — R05 Cough: Secondary | ICD-10-CM

## 2017-08-10 ENCOUNTER — Encounter (HOSPITAL_COMMUNITY): Payer: Medicare Other

## 2017-08-10 ENCOUNTER — Ambulatory Visit: Payer: Medicare Other | Admitting: Family

## 2017-08-12 ENCOUNTER — Ambulatory Visit: Payer: Medicare Other | Admitting: Family

## 2017-08-12 ENCOUNTER — Encounter (HOSPITAL_COMMUNITY): Payer: Medicare Other

## 2017-08-23 ENCOUNTER — Ambulatory Visit: Payer: Medicare Other | Admitting: Family

## 2017-08-23 ENCOUNTER — Inpatient Hospital Stay (HOSPITAL_COMMUNITY): Admission: RE | Admit: 2017-08-23 | Payer: Medicare Other | Source: Ambulatory Visit

## 2017-10-10 ENCOUNTER — Ambulatory Visit (INDEPENDENT_AMBULATORY_CARE_PROVIDER_SITE_OTHER): Payer: Medicare Other | Admitting: Family

## 2017-10-10 ENCOUNTER — Ambulatory Visit (HOSPITAL_COMMUNITY)
Admission: RE | Admit: 2017-10-10 | Discharge: 2017-10-10 | Disposition: A | Discharge status: Home / Self Care | Payer: Medicare Other | Source: Ambulatory Visit | Attending: Family | Admitting: Family

## 2017-10-10 ENCOUNTER — Encounter: Payer: Self-pay | Admitting: Family

## 2017-10-10 VITALS — BP 147/78 | HR 71 | Temp 97.4°F | Resp 18 | Ht 62.0 in | Wt 233.0 lb

## 2017-10-10 DIAGNOSIS — Z95828 Presence of other vascular implants and grafts: Secondary | ICD-10-CM | POA: Insufficient documentation

## 2017-10-10 DIAGNOSIS — I771 Stricture of artery: Secondary | ICD-10-CM | POA: Diagnosis not present

## 2017-10-10 DIAGNOSIS — E785 Hyperlipidemia, unspecified: Secondary | ICD-10-CM | POA: Diagnosis not present

## 2017-10-10 DIAGNOSIS — R0989 Other specified symptoms and signs involving the circulatory and respiratory systems: Secondary | ICD-10-CM | POA: Diagnosis present

## 2017-10-10 DIAGNOSIS — F17200 Nicotine dependence, unspecified, uncomplicated: Secondary | ICD-10-CM

## 2017-10-10 DIAGNOSIS — R9389 Abnormal findings on diagnostic imaging of other specified body structures: Secondary | ICD-10-CM | POA: Diagnosis not present

## 2017-10-10 NOTE — Progress Notes (Signed)
[facility]   CC: Follow up peripheral artery occlusive disease  History of Present Illness Kristin Williams is a 72 y.o. female who had a PTA and stenting of the right Iliac artery on 04/20/2004 by Dr. Scot Dock.  Before the above interventions she had just finished chemotherapy for breast cancer and developed black right great toe and pain in that toe.   She was lost to follow up for a year, returns for follow up today.   She does have low back issues with DDD and arthritis and is seeing a chiropractor who is helping with sciatic pain. She states she gets the pain when she stands too long or walks a long distance but denies claudication type symptoms.   She is exercising daily with stationary bike, states she feels great with exercising, her back feels better also. Pt. denies history of stroke or TIA, denies non healing ulcers on lower extremities.  She is using C-PAP and feels refreshed after sleeping.   Pt Diabetic: yes, pt states her April 2018 A1C was 6.1 Pt smoker: smoker (less than1 ppd, started smoking at age 55 yrs); states she is working on trying to quit   Pt meds include:  Statin :Yes Betablocker: No ASA: Yes Other anticoagulants/antiplatelets: no    Past Medical History:  Diagnosis Date  . Arthritis   . Cancer ([place])    Cervical  . Cancer ([place])    Breast  . Cancer ([place])    Vaginal  . COPD (chronic obstructive pulmonary disease) ([place])   . Headache(784.0)   . Hyperlipidemia   . Leg pain   . Peripheral vascular disease ([place])   . Personal history of chemotherapy   . Personal history of radiation therapy     Social History Social History   Tobacco Use  . Smoking status: Light Tobacco Smoker    Packs/day: 1.00    Years: 40.00    Pack years: 40.00    Types: Cigarettes  . Smokeless tobacco: Never Used  Substance Use Topics  . Alcohol use: No  . Drug use: No    Family History Family History  Problem Relation Age of  Onset  . Cancer Mother        Non-Hodgkins Disease T-Cell  . Heart failure Father   . COPD Father   . Heart disease Brother 50       MI and Heart Disease before age 2  . Heart attack Brother     Past Surgical History:  Procedure Laterality Date  . ABDOMINAL HYSTERECTOMY  1975  . BREAST LUMPECTOMY  2005  . GANGLION CYST EXCISION  1977  . ILIAC ARTERY STENT  04/20/2004   CSD right iliac occlusive disease    No Known Allergies  Current Outpatient Medications  Medication Sig Dispense Refill  . albuterol (PROVENTIL HFA;VENTOLIN HFA) 108 (90 BASE) MCG/ACT inhaler Inhale 2 puffs into the lungs as needed for wheezing or shortness of breath.    Marland Kitchen albuterol (PROVENTIL) (2.5 MG/3ML) 0.083% nebulizer solution Take 2.5 mg by nebulization every 6 (six) hours as needed for wheezing.    Marland Kitchen atorvastatin (LIPITOR) 10 MG tablet Take 10 mg by mouth daily.      . Calcium Carbonate-Vitamin D (CALCIUM + D PO) Take by mouth 2 (two) times daily.      . Cholecalciferol (VITAMIN D3) 3000 UNITS TABS Take 2,000 mg by mouth daily.    Marland Kitchen CRANBERRY FRUIT PO Take 84 mg by mouth daily.    Marland Kitchen  fish oil-omega-3 fatty acids 1000 MG capsule Take 1,000 mg by mouth 2 (two) times daily.    . fluorouracil (EFUDEX) 5 % cream     . Multiple Vitamin (MULTIVITAMIN PO) Take by mouth daily.      Marland Kitchen omeprazole (PRILOSEC) 20 MG capsule Take 20 mg by mouth daily.     Marland Kitchen alendronate (FOSAMAX) 35 MG tablet Take 35 mg by mouth every 7 (seven) days.     Marland Kitchen amoxicillin (AMOXIL) 875 MG tablet Take 1 tablet (875 mg total) by mouth 2 (two) times daily. (Patient not taking: Reported on 10/10/2017) 20 tablet 0  . aspirin 81 MG EC tablet Take 81 mg by mouth daily.      Marland Kitchen HYDROcodone-homatropine (HYDROMET) 5-1.5 MG/5ML syrup Take 5 mLs by mouth every 6 (six) hours as needed for cough. (Patient not taking: Reported on 10/10/2017) 60 mL 0  . naproxen sodium (ANAPROX) 220 MG tablet Take 220 mg by mouth 2 (two) times daily with a meal.    . predniSONE  (DELTASONE) 20 MG tablet Two daily with food (Patient not taking: Reported on 10/10/2017) 6 tablet 0   No current facility-administered medications for this visit.     ROS: See HPI for pertinent positives and negatives.   Physical Examination  Vitals:   10/10/17 0904  BP: (!) 147/78  Pulse: 71  Resp: 18  Temp: (!) 97.4 F (36.3 C)  TempSrc: Oral  SpO2: 97%  Weight: 233 lb (105.7 kg)  Height: 5\' 2"  (1.575 m)   Body mass index is 42.62 kg/m.  General: A&O x 3, WDWN, morbidly obese female. Gait: normal HENT: No gross abnormalities.  Eyes: PERRLA. Pulmonary: Respirations are non labored, CTAB, distant breaths sounds Cardiac: regular rhythm, no detected murmur.         Carotid Bruits Right Left   Negative Negative   Radial pulses are 2+ palpable bilaterally   Adominal aortic pulse is not palpable                         VASCULAR EXAM: Extremities without ischemic changes, without Gangrene; without open wounds.                                                                                                          LE Pulses Right Left       FEMORAL  not palpable (morbidly obese)  not  palpable        POPLITEAL  1+ palpable   not palpable       POSTERIOR TIBIAL  2+ palpable   1+ palpable        DORSALIS PEDIS      ANTERIOR TIBIAL not palpable  1+ palpable    Abdomen: soft, NT, no palpable masses. Large panus Skin: no rashes, no cellulitis, no ulcers noted. Musculoskeletal: no muscle wasting or atrophy.  Neurologic: A&O X 3; appropriate affect, Sensation is normal; MOTOR FUNCTION:  moving all extremities equally, motor strength 5/5 throughout. Speech is fluent/normal. CN 2-12 intact. Psychiatric: Thought content is  normal, mood appropriate for clinical situation.     ASSESSMENT: Kristin Williams is a 72 y.o. female who is s/p PTA and stenting of the right Iliac artery on 04/20/2004. She has no claudication symptoms with walking. There are no signs of ischemia in  her feet or legs.  Bilateral ABI remains normal with tri and biphasic waveforms, bilateral TBI have declined.   Her atherosclerotic risk factors include active smoking x 54 years, well controlled DM, morbid obesity, history of breast and cervical cancer, and COPD.    DATA  ABI (Date: 10/10/2017):  R:   ABI: 1.10 (was 1.18 on 08-04-16),   PT: tri  DP: bi  TBI:  0.54, toe pressure 85 (was 0.79)  L:   ABI: 1.15 (was 1.34),   PT: tri  DP: bi  TBI: 0.66, toe pressure 103 (was 0.79)  Bilateral ABI remain normal with tri and biphasic waveforms.  Decline in bilateral TBI.    PLAN:  The patient was counseled re smoking cessation and given several free resources re smoking cessation.  I advised her to exercise her legs at least a total of 30 minutes daily.    Based on the patient's vascular studies and examination, pt will return to clinic in 1 year for ABI's. I advised her to notify us if she develops concerns re the circulation in her feet or legs.    I discussed in depth with the patient the nature of atherosclerosis, and emphasized the importance of maximal medical management including strict control of blood pressure, blood glucose, and lipid levels, obtaining regular exercise, and cessation of smoking.  The patient is aware that without maximal medical management the underlying atherosclerotic disease process will progress, limiting the benefit of any interventions.  The patient was given information about PAD including signs, symptoms, treatment, what symptoms should prompt the patient to seek immediate medical care, and risk reduction measures to take.  Clemon Chambers, RN, MSN, FNP-C [facility] Phone: [phone]  Clinic MD: Oneida Alar  10/10/17 9:15 AM

## 2017-10-10 NOTE — Patient Instructions (Signed)
Steps to Quit Smoking Smoking tobacco can be bad for your health. It can also affect almost every organ in your body. Smoking puts you and people around you at risk for many serious long-lasting (chronic) diseases. Quitting smoking is hard, but it is one of the best things that you can do for your health. It is never too late to quit. What are the benefits of quitting smoking? When you quit smoking, you lower your risk for getting serious diseases and conditions. They can include:  Lung cancer or lung disease.  Heart disease.  Stroke.  Heart attack.  Not being able to have children (infertility).  Weak bones (osteoporosis) and broken bones (fractures).  If you have coughing, wheezing, and shortness of breath, those symptoms may get better when you quit. You may also get sick less often. If you are pregnant, quitting smoking can help to lower your chances of having a baby of low birth weight. What can I do to help me quit smoking? Talk with your doctor about what can help you quit smoking. Some things you can do (strategies) include:  Quitting smoking totally, instead of slowly cutting back how much you smoke over a period of time.  Going to in-person counseling. You are more likely to quit if you go to many counseling sessions.  Using resources and support systems, such as: ? Online chats with a counselor. ? Phone quitlines. ? Printed self-help materials. ? Support groups or group counseling. ? Text messaging programs. ? Mobile phone apps or applications.  Taking medicines. Some of these medicines may have nicotine in them. If you are pregnant or breastfeeding, do not take any medicines to quit smoking unless your doctor says it is okay. Talk with your doctor about counseling or other things that can help you.  Talk with your doctor about using more than one strategy at the same time, such as taking medicines while you are also going to in-person counseling. This can help make  quitting easier. What things can I do to make it easier to quit? Quitting smoking might feel very hard at first, but there is a lot that you can do to make it easier. Take these steps:  Talk to your family and friends. Ask them to support and encourage you.  Call phone quitlines, reach out to support groups, or work with a counselor.  Ask people who smoke to not smoke around you.  Avoid places that make you want (trigger) to smoke, such as: ? Bars. ? Parties. ? Smoke-break areas at work.  Spend time with people who do not smoke.  Lower the stress in your life. Stress can make you want to smoke. Try these things to help your stress: ? Getting regular exercise. ? Deep-breathing exercises. ? Yoga. ? Meditating. ? Doing a body scan. To do this, close your eyes, focus on one area of your body at a time from head to toe, and notice which parts of your body are tense. Try to relax the muscles in those areas.  Download or buy apps on your mobile phone or tablet that can help you stick to your quit plan. There are many free apps, such as QuitGuide from the CDC (Centers for Disease Control and Prevention). You can find more support from smokefree.gov and other websites.  This information is not intended to replace advice given to you by your health care provider. Make sure you discuss any questions you have with your health care provider. Document Released: 12/26/2008 Document   Revised: 10/28/2015 Document Reviewed: 07/16/2014 Elsevier Interactive Patient Education  2018 Elsevier Inc.     Peripheral Vascular Disease Peripheral vascular disease (PVD) is a disease of the blood vessels that are not part of your heart and brain. A simple term for PVD is poor circulation. In most cases, PVD narrows the blood vessels that carry blood from your heart to the rest of your body. This can result in a decreased supply of blood to your arms, legs, and internal organs, like your stomach or kidneys.  However, it most often affects a person's lower legs and feet. There are two types of PVD.  Organic PVD. This is the more common type. It is caused by damage to the structure of blood vessels.  Functional PVD. This is caused by conditions that make blood vessels contract and tighten (spasm).  Without treatment, PVD tends to get worse over time. PVD can also lead to acute ischemic limb. This is when an arm or limb suddenly has trouble getting enough blood. This is a medical emergency. Follow these instructions at home:  Take medicines only as told by your doctor.  Do not use any tobacco products, including cigarettes, chewing tobacco, or electronic cigarettes. If you need help quitting, ask your doctor.  Lose weight if you are overweight, and maintain a healthy weight as told by your doctor.  Eat a diet that is low in fat and cholesterol. If you need help, ask your doctor.  Exercise regularly. Ask your doctor for some good activities for you.  Take good care of your feet. ? Wear comfortable shoes that fit well. ? Check your feet often for any cuts or sores. Contact a doctor if:  You have cramps in your legs while walking.  You have leg pain when you are at rest.  You have coldness in a leg or foot.  Your skin changes.  You are unable to get or have an erection (erectile dysfunction).  You have cuts or sores on your feet that are not healing. Get help right away if:  Your arm or leg turns cold and blue.  Your arms or legs become red, warm, swollen, painful, or numb.  You have chest pain or trouble breathing.  You suddenly have weakness in your face, arm, or leg.  You become very confused or you cannot speak.  You suddenly have a very bad headache.  You suddenly cannot see. This information is not intended to replace advice given to you by your health care provider. Make sure you discuss any questions you have with your health care provider. Document Released:  05/26/2009 Document Revised: 08/07/2015 Document Reviewed: 08/09/2013 Elsevier Interactive Patient Education  2017 Elsevier Inc.  

## 2017-11-02 DIAGNOSIS — G4733 Obstructive sleep apnea (adult) (pediatric): Secondary | ICD-10-CM | POA: Diagnosis not present

## 2017-11-16 DIAGNOSIS — B372 Candidiasis of skin and nail: Secondary | ICD-10-CM | POA: Diagnosis not present

## 2017-12-05 ENCOUNTER — Other Ambulatory Visit: Payer: Self-pay | Admitting: Obstetrics and Gynecology

## 2017-12-05 DIAGNOSIS — Z1231 Encounter for screening mammogram for malignant neoplasm of breast: Secondary | ICD-10-CM

## 2017-12-16 DIAGNOSIS — Z23 Encounter for immunization: Secondary | ICD-10-CM | POA: Diagnosis not present

## 2017-12-30 ENCOUNTER — Ambulatory Visit
Admission: RE | Admit: 2017-12-30 | Discharge: 2017-12-30 | Disposition: A | Discharge status: Home / Self Care | Payer: Medicare Other | Source: Ambulatory Visit | Attending: Obstetrics and Gynecology | Admitting: Obstetrics and Gynecology

## 2017-12-30 DIAGNOSIS — Z1231 Encounter for screening mammogram for malignant neoplasm of breast: Secondary | ICD-10-CM

## 2018-01-02 ENCOUNTER — Other Ambulatory Visit: Payer: Self-pay | Admitting: Obstetrics and Gynecology

## 2018-01-02 DIAGNOSIS — R928 Other abnormal and inconclusive findings on diagnostic imaging of breast: Secondary | ICD-10-CM

## 2018-01-02 DIAGNOSIS — R7303 Prediabetes: Secondary | ICD-10-CM | POA: Diagnosis not present

## 2018-01-04 ENCOUNTER — Ambulatory Visit
Admission: RE | Admit: 2018-01-04 | Discharge: 2018-01-04 | Disposition: A | Discharge status: Home / Self Care | Payer: Medicare Other | Source: Ambulatory Visit | Attending: Obstetrics and Gynecology | Admitting: Obstetrics and Gynecology

## 2018-01-04 ENCOUNTER — Other Ambulatory Visit: Payer: Self-pay | Admitting: Obstetrics and Gynecology

## 2018-01-04 DIAGNOSIS — N6313 Unspecified lump in the right breast, lower outer quadrant: Secondary | ICD-10-CM | POA: Diagnosis not present

## 2018-01-04 DIAGNOSIS — R928 Other abnormal and inconclusive findings on diagnostic imaging of breast: Secondary | ICD-10-CM

## 2018-01-04 DIAGNOSIS — N6310 Unspecified lump in the right breast, unspecified quadrant: Secondary | ICD-10-CM

## 2018-01-06 DIAGNOSIS — N6310 Unspecified lump in the right breast, unspecified quadrant: Secondary | ICD-10-CM | POA: Diagnosis not present

## 2018-01-06 DIAGNOSIS — H8110 Benign paroxysmal vertigo, unspecified ear: Secondary | ICD-10-CM | POA: Diagnosis not present

## 2018-01-06 DIAGNOSIS — J309 Allergic rhinitis, unspecified: Secondary | ICD-10-CM | POA: Diagnosis not present

## 2018-01-08 ENCOUNTER — Other Ambulatory Visit: Payer: Self-pay

## 2018-01-08 ENCOUNTER — Encounter (HOSPITAL_COMMUNITY): Payer: Self-pay

## 2018-01-08 ENCOUNTER — Emergency Department (HOSPITAL_COMMUNITY)
Admission: EM | Admit: 2018-01-08 | Discharge: 2018-01-08 | Disposition: A | Discharge status: Home / Self Care | Payer: Medicare Other | Attending: Emergency Medicine | Admitting: Emergency Medicine

## 2018-01-08 ENCOUNTER — Emergency Department (HOSPITAL_COMMUNITY): Payer: Medicare Other

## 2018-01-08 DIAGNOSIS — R110 Nausea: Secondary | ICD-10-CM

## 2018-01-08 DIAGNOSIS — F17210 Nicotine dependence, cigarettes, uncomplicated: Secondary | ICD-10-CM | POA: Diagnosis not present

## 2018-01-08 DIAGNOSIS — J449 Chronic obstructive pulmonary disease, unspecified: Secondary | ICD-10-CM | POA: Insufficient documentation

## 2018-01-08 DIAGNOSIS — K449 Diaphragmatic hernia without obstruction or gangrene: Secondary | ICD-10-CM

## 2018-01-08 DIAGNOSIS — R42 Dizziness and giddiness: Secondary | ICD-10-CM | POA: Insufficient documentation

## 2018-01-08 DIAGNOSIS — Z79899 Other long term (current) drug therapy: Secondary | ICD-10-CM | POA: Diagnosis not present

## 2018-01-08 DIAGNOSIS — R197 Diarrhea, unspecified: Secondary | ICD-10-CM | POA: Diagnosis not present

## 2018-01-08 DIAGNOSIS — I959 Hypotension, unspecified: Secondary | ICD-10-CM | POA: Diagnosis not present

## 2018-01-08 DIAGNOSIS — R1110 Vomiting, unspecified: Secondary | ICD-10-CM | POA: Diagnosis present

## 2018-01-08 DIAGNOSIS — Z8541 Personal history of malignant neoplasm of cervix uteri: Secondary | ICD-10-CM | POA: Diagnosis not present

## 2018-01-08 LAB — COMPREHENSIVE METABOLIC PANEL
ALBUMIN: 3.8 g/dL (ref 3.5–5.0)
ALT: 21 U/L (ref 0–44)
ANION GAP: 8 (ref 5–15)
AST: 18 U/L (ref 15–41)
Alkaline Phosphatase: 80 U/L (ref 38–126)
BILIRUBIN TOTAL: 0.6 mg/dL (ref 0.3–1.2)
BUN: 10 mg/dL (ref 8–23)
CALCIUM: 9.1 mg/dL (ref 8.9–10.3)
CO2: 26 mmol/L (ref 22–32)
Chloride: 103 mmol/L (ref 98–111)
Creatinine, Ser: 0.82 mg/dL (ref 0.44–1.00)
GFR calc Af Amer: >60 mL/min (ref >60)
GFR calc non Af Amer: >60 mL/min (ref >60)
GLUCOSE: 102 mg/dL — AB (ref 70–99)
Potassium: 4 mmol/L (ref 3.5–5.1)
Sodium: 137 mmol/L (ref 135–145)
TOTAL PROTEIN: 7.2 g/dL (ref 6.5–8.1)

## 2018-01-08 LAB — CBC WITH DIFFERENTIAL/PLATELET
Abs Immature Granulocytes: 0.05 10*3/uL (ref 0.00–0.07)
BASOS PCT: 0 %
Basophils Absolute: 0 10*3/uL (ref 0.0–0.1)
EOS ABS: 0.1 10*3/uL (ref 0.0–0.5)
EOS PCT: 1 %
HCT: 40.6 % (ref 36.0–46.0)
Hemoglobin: 12.8 g/dL (ref 12.0–15.0)
Immature Granulocytes: 0 %
Lymphocytes Relative: 8 %
Lymphs Abs: 1 10*3/uL (ref 0.7–4.0)
MCH: 29.7 pg (ref 26.0–34.0)
MCHC: 31.5 g/dL (ref 30.0–36.0)
MCV: 94.2 fL (ref 80.0–100.0)
MONO ABS: 0.8 10*3/uL (ref 0.1–1.0)
MONOS PCT: 7 %
Neutro Abs: 10 10*3/uL — ABNORMAL HIGH (ref 1.7–7.7)
Neutrophils Relative %: 84 %
Platelets: 265 10*3/uL (ref 150–400)
RBC: 4.31 MIL/uL (ref 3.87–5.11)
RDW: 13.2 % (ref 11.5–15.5)
WBC: 11.9 10*3/uL — AB (ref 4.0–10.5)
nRBC: 0 % (ref 0.0–0.2)

## 2018-01-08 LAB — LIPASE, BLOOD: Lipase: 27 U/L (ref 11–51)

## 2018-01-08 MED ORDER — IOPAMIDOL (ISOVUE-300) INJECTION 61%
100.0000 mL | Freq: Once | INTRAVENOUS | Status: AC | PRN
  Administered 2018-01-08: 100 mL via INTRAVENOUS

## 2018-01-08 MED ORDER — METOCLOPRAMIDE HCL 5 MG/ML IJ SOLN
10.0000 mg | Freq: Once | INTRAMUSCULAR | Status: AC
  Administered 2018-01-08: 10 mg via INTRAVENOUS
  Filled 2018-01-08: qty 2

## 2018-01-08 MED ORDER — ONDANSETRON HCL 4 MG/2ML IJ SOLN
4.0000 mg | Freq: Once | INTRAMUSCULAR | Status: AC
  Administered 2018-01-08: 4 mg via INTRAVENOUS
  Filled 2018-01-08: qty 2

## 2018-01-08 MED ORDER — SODIUM CHLORIDE 0.9 % IV BOLUS
1000.0000 mL | Freq: Once | INTRAVENOUS | Status: AC
  Administered 2018-01-08: 1000 mL via INTRAVENOUS

## 2018-01-08 MED ORDER — LORAZEPAM 1 MG PO TABS: 1.0000 mg | ORAL_TABLET | Freq: Three times a day (TID) | ORAL | 0 refills | Status: DC | PRN

## 2018-01-08 NOTE — Discharge Instructions (Addendum)
CT scan showed a hiatal hernia.  Additionally, you have a cyst on your left kidney and a small mass on your adrenal gland which is probably benign.  Prescription for Ativan.  Follow-up with your primary care doctor this week.

## 2018-01-08 NOTE — ED Triage Notes (Addendum)
Pt had went in to eat lunch and started having abd pain and had diarrhea.   Pt then started feeling dizzy.  Pt has history of vertigo. Reports has been dealing with vertigo for the past week.   Reports nausea and vomited x 1 prior to EMS arrival.  EMS started IV and gave 4mg  zofran.  cbg 103.  Vss.  Pt saw pcp Friday and was told to take zyrtec and meclizine.  PT also reports for the past 2 months she has been getting food stuck in esophagus.  Pt also reports had a mammogram approx 1 week ago and was told she needed a biopsy so she is scheduled for a biopsy Tuesday.

## 2018-01-08 NOTE — ED Provider Notes (Signed)
[facility] EMERGENCY DEPARTMENT Provider Note   CSN: [id number] Arrival date & time: 01/08/18  1433     History   Chief Complaint Chief Complaint  Patient presents with  . Abdominal Pain  . Dizziness    HPI Kristin Williams is a 72 y.o. female.  Nausea for 10 days with clear vomiting earlier today with associated sense of room spinning after bending over today.  No facial asymmetry, confusion, motor or sensory deficits, speech difficulty.  Patient saw her primary care doctor on Friday and was given Zofran, Zyrtec, and meclizine.  Patient is scheduled for a breast biopsy on Tuesday.  Fever, sweats, chills, chest pain, dyspnea, dysuria.  Severity of symptoms is minimal to moderate.  Nothing makes symptoms better or worse.     Past Medical History:  Diagnosis Date  . Arthritis   . Cancer ([place])    Cervical  . Cancer ([place])    Breast  . 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 COPD (chronic obstructive pulmonary disease) ([place])   . Headache(784.0)   . Hyperlipidemia   . Leg pain   . Peripheral vascular disease ([place])   . Personal history of chemotherapy   . Personal history of radiation therapy     Patient Active Problem List   Diagnosis Date Noted  . Aftercare following surgery of the circulatory system, [place] 12/06/2012  . 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 ABDOMINAL HYSTERECTOMY  1975  . BREAST LUMPECTOMY  2005  . GANGLION CYST EXCISION  1977  . ILIAC ARTERY STENT  04/20/2004   CSD right iliac occlusive disease     OB History   None      Home Medications    Prior to Admission medications   Medication Sig Start Date End Date Taking? Authorizing Provider  albuterol (PROVENTIL HFA;VENTOLIN HFA) 108 (90 BASE) MCG/ACT inhaler Inhale 2 puffs into the lungs every 6 (six) hours as needed for wheezing or shortness of breath.    Yes [provider]  albuterol (PROVENTIL) (2.5 MG/3ML) 0.083% nebulizer  solution Take 2.5 mg by nebulization every 6 (six) hours as needed for wheezing.   Yes [provider]  atorvastatin (LIPITOR) 20 MG tablet Take 20 mg by mouth every morning.    Yes [provider]  Calcium Carbonate-Vitamin D (CALCIUM + D PO) Take by mouth 2 (two) times daily.     Yes [provider]  cetirizine (ZYRTEC) 10 MG tablet Take 10 mg by mouth every morning.   Yes [provider]  Cholecalciferol (VITAMIN D3) 3000 UNITS TABS Take 2,000 mg by mouth daily.   Yes [provider]  CRANBERRY FRUIT PO Take 84 mg by mouth daily.   Yes [provider]  fish oil-omega-3 fatty acids 1000 MG capsule Take 1,000 mg by mouth 2 (two) times daily.   Yes [provider]  fluorouracil (EFUDEX) 5 % cream Apply 1 application topically once a week. vaginally 11/08/11  Yes [provider]  meclizine (ANTIVERT) 25 MG tablet Take 25 mg by mouth 3 (three) times daily as needed for dizziness.  01/06/18  Yes [provider]  Multiple Vitamin (MULTIVITAMIN WITH MINERALS) TABS tablet Take 1 tablet by mouth daily.   Yes [provider]  omeprazole (PRILOSEC) 20 MG capsule Take 20 mg by mouth every morning.  09/09/10  Yes [provider]  ondansetron (ZOFRAN) 8 MG tablet Take 8 mg by  mouth every 6 (six) hours as needed for nausea or vomiting.  01/06/18  Yes [provider]  LORazepam (ATIVAN) 1 MG tablet Take 1 tablet (1 mg total) by mouth 3 (three) times daily as needed for anxiety. 01/08/18   Nat Christen, MD    Family History Family History  Problem Relation Age of Onset  . Cancer Mother        Non-Hodgkins Disease T-Cell  . Heart failure Father   . COPD Father   . Heart disease Brother 18       MI and Heart Disease before age 71  . Heart attack Brother   . Breast cancer Neg Hx     Social History Social History   Tobacco Use  . Smoking status: Light Tobacco Smoker    Packs/day: 1.00    Years:  40.00    Pack years: 40.00    Types: Cigarettes  . Smokeless tobacco: Never Used  Substance Use Topics  . Alcohol use: No  . Drug use: No     Allergies   Flonase [fluticasone propionate]   Review of Systems Review of Systems  All other systems reviewed and are negative.    Physical Exam Updated Vital Signs BP 140/60   Pulse 76   Temp 97.6 F (36.4 C) (Oral)   Resp 20   Ht 5' (1.524 m)   Wt 102.1 kg   SpO2 98%   BMI 43.94 kg/m   Physical Exam  Constitutional: She is oriented to person, place, and time. She appears well-developed and well-nourished.  nad  HENT:  Head: Normocephalic and atraumatic.  Eyes: Conjunctivae are normal.  Neck: Neck supple.  Cardiovascular: Normal rate and regular rhythm.  Pulmonary/Chest: Effort normal and breath sounds normal.  Abdominal: Soft. Bowel sounds are normal.  No frank abdominal tenderness.  Musculoskeletal: Normal range of motion.  Neurological: She is alert and oriented to person, place, and time.  No nystagmus  Skin: Skin is warm and dry.  Psychiatric: She has a normal mood and affect. Her behavior is normal.  Nursing note and vitals reviewed.    ED Treatments / Results  Labs (all labs ordered are listed, but only abnormal results are displayed) Labs Reviewed  CBC WITH DIFFERENTIAL/PLATELET - Abnormal; Notable for the following components:      Result Value   WBC 11.9 (*)    Neutro Abs 10.0 (*)    All other components within normal limits  COMPREHENSIVE METABOLIC PANEL - Abnormal; Notable for the following components:   Glucose, Bld 102 (*)    All other components within normal limits  LIPASE, BLOOD    EKG None  Radiology Ct Abdomen Pelvis W Contrast  Result Date: 01/08/2018 CLINICAL DATA:  Patient went etiology is started having abdominal pain and diarrhea with dizziness. Patient had been dealing with vertigo over the past week. EXAM: CT ABDOMEN AND PELVIS WITH CONTRAST TECHNIQUE: Multidetector CT  imaging of the abdomen and pelvis was performed using the standard protocol following bolus administration of intravenous contrast. CONTRAST:  170mL ISOVUE-300 IOPAMIDOL (ISOVUE-300) INJECTION 61% COMPARISON:  None FINDINGS: Lower chest: Moderate sized hiatal hernia. Normal size heart. No active pulmonary disease. Hepatobiliary: No focal liver abnormality is seen. No gallstones, gallbladder wall thickening, or biliary dilatation. Pancreas: Unremarkable. No pancreatic ductal dilatation or surrounding inflammatory changes. Spleen: Normal in size without focal abnormality. Adrenals/Urinary Tract: Normal right adrenal gland. 1.7 cm nodule in the anterior limb of the left adrenal gland likely to represent a small adenoma.  Water attenuating cyst off the lower pole of the left kidney measuring up to 1.8 cm. No nephrolithiasis nor hydroureteronephrosis. Tiny focus of air along the nondependent portion of the urinary bladder possibly secondary to intervention. Stomach/Bowel: The stomach, duodenum, ligament Treitz, small intestine and colon are unremarkable. No bowel obstruction or inflammation. Normal appearing appendix. Vascular/Lymphatic: Moderate aortoiliac atherosclerosis. No adenopathy. Reproductive: Hysterectomy.  No adnexal mass. Other: No abdominal wall hernia or abnormality. No abdominopelvic ascites. Musculoskeletal: Mild grade 1 anterolisthesis of L3 on L4 and L4 on L5 associated with degenerative disc and facet arthropathy. Multilevel degenerative facet arthropathy is seen along the entirety of the lumbar spine. Degenerative disc disease also noted L1-2 with vacuum disc phenomenon. No aggressive osseous lesions or fracture. IMPRESSION: 1. Nonobstructed, nondistended bowel. No acute inflammatory process. 2. Moderate-sized hiatal hernia noted. 3. Low-density 1.7 cm left adrenal nodule likely to represent a small adenoma. Lower pole left renal cyst measuring 1.8 cm. 4. Moderate aortic atherosclerosis. 5. Lumbar  spondylosis and facet arthropathy. Electronically Signed   By: Ashley Royalty M.D.   On: 01/08/2018 17:24    Procedures Procedures (including critical care time)  Medications Ordered in ED Medications  sodium chloride 0.9 % bolus 1,000 mL (0 mLs Intravenous Stopped 01/08/18 1711)  ondansetron (ZOFRAN) injection 4 mg (4 mg Intravenous Given 01/08/18 1610)  metoCLOPramide (REGLAN) injection 10 mg (10 mg Intravenous Given 01/08/18 1610)  iopamidol (ISOVUE-300) 61 % injection 100 mL (100 mLs Intravenous Contrast Given 01/08/18 1643)     Initial Impression / Assessment and Plan / ED Course  I have reviewed the triage vital signs and the nursing notes.  Pertinent labs & imaging results that were available during my care of the patient were reviewed by me and considered in my medical decision making (see chart for details).     Patient presents with prolonged nausea with associated vertiginous-like symptoms today.  Screening labs acceptable.  CT scan shows a hiatal hernia, left adrenal adenoma, and left renal cyst.  She feels better after IV fluids, IV Reglan, IV Zofran.  Discharge medication Ativan 1 mg as needed for her vertiginous symptoms.  She has primary care follow-up this week.  Final Clinical Impressions(s) / ED Diagnoses   Final diagnoses:  Vertigo  Nausea  Hiatal hernia    ED Discharge Orders         Ordered    LORazepam (ATIVAN) 1 MG tablet  3 times daily PRN     01/08/18 1939           Nat Christen, MD 01/08/18 [phone]

## 2018-01-10 ENCOUNTER — Ambulatory Visit
Admission: RE | Admit: 2018-01-10 | Discharge: 2018-01-10 | Disposition: A | Discharge status: Home / Self Care | Payer: Medicare Other | Source: Ambulatory Visit | Attending: Obstetrics and Gynecology | Admitting: Obstetrics and Gynecology

## 2018-01-10 ENCOUNTER — Other Ambulatory Visit: Payer: Self-pay | Admitting: Obstetrics and Gynecology

## 2018-01-10 DIAGNOSIS — N6310 Unspecified lump in the right breast, unspecified quadrant: Secondary | ICD-10-CM

## 2018-01-10 DIAGNOSIS — C50811 Malignant neoplasm of overlapping sites of right female breast: Secondary | ICD-10-CM | POA: Diagnosis not present

## 2018-01-10 DIAGNOSIS — N6313 Unspecified lump in the right breast, lower outer quadrant: Secondary | ICD-10-CM | POA: Diagnosis not present

## 2018-01-11 DIAGNOSIS — E279 Disorder of adrenal gland, unspecified: Secondary | ICD-10-CM | POA: Diagnosis not present

## 2018-01-11 DIAGNOSIS — R1310 Dysphagia, unspecified: Secondary | ICD-10-CM | POA: Diagnosis not present

## 2018-01-11 DIAGNOSIS — R42 Dizziness and giddiness: Secondary | ICD-10-CM | POA: Diagnosis not present

## 2018-01-17 ENCOUNTER — Other Ambulatory Visit: Payer: Self-pay | Admitting: General Surgery

## 2018-01-17 DIAGNOSIS — C50511 Malignant neoplasm of lower-outer quadrant of right female breast: Secondary | ICD-10-CM | POA: Diagnosis not present

## 2018-01-17 DIAGNOSIS — Z170 Estrogen receptor positive status [ER+]: Principal | ICD-10-CM

## 2018-01-18 ENCOUNTER — Other Ambulatory Visit: Payer: Self-pay | Admitting: General Surgery

## 2018-01-18 DIAGNOSIS — C50511 Malignant neoplasm of lower-outer quadrant of right female breast: Secondary | ICD-10-CM

## 2018-01-18 DIAGNOSIS — Z170 Estrogen receptor positive status [ER+]: Principal | ICD-10-CM

## 2018-01-23 DIAGNOSIS — B372 Candidiasis of skin and nail: Secondary | ICD-10-CM | POA: Diagnosis not present

## 2018-01-24 ENCOUNTER — Other Ambulatory Visit: Payer: Self-pay | Admitting: Gastroenterology

## 2018-01-24 DIAGNOSIS — Z801 Family history of malignant neoplasm of trachea, bronchus and lung: Secondary | ICD-10-CM | POA: Diagnosis not present

## 2018-01-24 DIAGNOSIS — R1013 Epigastric pain: Secondary | ICD-10-CM | POA: Diagnosis not present

## 2018-01-24 DIAGNOSIS — C50911 Malignant neoplasm of unspecified site of right female breast: Secondary | ICD-10-CM | POA: Diagnosis not present

## 2018-01-24 DIAGNOSIS — Z8541 Personal history of malignant neoplasm of cervix uteri: Secondary | ICD-10-CM | POA: Diagnosis not present

## 2018-01-24 DIAGNOSIS — R109 Unspecified abdominal pain: Secondary | ICD-10-CM

## 2018-01-24 DIAGNOSIS — Z803 Family history of malignant neoplasm of breast: Secondary | ICD-10-CM | POA: Diagnosis not present

## 2018-01-24 DIAGNOSIS — Z853 Personal history of malignant neoplasm of breast: Secondary | ICD-10-CM | POA: Diagnosis not present

## 2018-01-25 ENCOUNTER — Ambulatory Visit
Admission: RE | Admit: 2018-01-25 | Discharge: 2018-01-25 | Disposition: A | Discharge status: Home / Self Care | Payer: Medicare Other | Source: Ambulatory Visit | Attending: Gastroenterology | Admitting: Gastroenterology

## 2018-01-25 ENCOUNTER — Telehealth: Payer: Self-pay | Admitting: Oncology

## 2018-01-25 ENCOUNTER — Other Ambulatory Visit: Payer: Self-pay

## 2018-01-25 ENCOUNTER — Encounter (HOSPITAL_BASED_OUTPATIENT_CLINIC_OR_DEPARTMENT_OTHER): Payer: Self-pay | Admitting: *Deleted

## 2018-01-25 ENCOUNTER — Other Ambulatory Visit: Payer: Self-pay | Admitting: Gastroenterology

## 2018-01-25 DIAGNOSIS — R42 Dizziness and giddiness: Secondary | ICD-10-CM

## 2018-01-25 DIAGNOSIS — R1013 Epigastric pain: Secondary | ICD-10-CM | POA: Diagnosis not present

## 2018-01-25 DIAGNOSIS — K449 Diaphragmatic hernia without obstruction or gangrene: Secondary | ICD-10-CM | POA: Diagnosis not present

## 2018-01-25 DIAGNOSIS — D490 Neoplasm of unspecified behavior of digestive system: Secondary | ICD-10-CM

## 2018-01-25 DIAGNOSIS — R110 Nausea: Secondary | ICD-10-CM | POA: Diagnosis not present

## 2018-01-25 DIAGNOSIS — R109 Unspecified abdominal pain: Secondary | ICD-10-CM

## 2018-01-25 DIAGNOSIS — N281 Cyst of kidney, acquired: Secondary | ICD-10-CM | POA: Diagnosis not present

## 2018-01-25 NOTE — Telephone Encounter (Signed)
Lft the pt a vm to schedule an appt w/Dr. Jana Hakim

## 2018-01-26 ENCOUNTER — Ambulatory Visit
Admission: RE | Admit: 2018-01-26 | Discharge: 2018-01-26 | Disposition: A | Discharge status: Home / Self Care | Payer: Medicare Other | Source: Ambulatory Visit | Attending: Gastroenterology | Admitting: Gastroenterology

## 2018-01-26 DIAGNOSIS — R42 Dizziness and giddiness: Secondary | ICD-10-CM

## 2018-01-26 DIAGNOSIS — D490 Neoplasm of unspecified behavior of digestive system: Secondary | ICD-10-CM

## 2018-01-26 MED ORDER — IOPAMIDOL (ISOVUE-300) INJECTION 61%
75.0000 mL | Freq: Once | INTRAVENOUS | Status: AC | PRN
  Administered 2018-01-26: 75 mL via INTRAVENOUS

## 2018-01-27 ENCOUNTER — Encounter (HOSPITAL_COMMUNITY): Payer: Self-pay | Admitting: Emergency Medicine

## 2018-01-27 ENCOUNTER — Other Ambulatory Visit: Payer: Self-pay

## 2018-01-27 ENCOUNTER — Inpatient Hospital Stay (HOSPITAL_COMMUNITY)
Admission: EM | Admit: 2018-01-27 | Discharge: 2018-01-31 | DRG: 054 | Disposition: A | Discharge status: Home / Self Care | Payer: Medicare Other | Attending: Neurology | Admitting: Neurology

## 2018-01-27 ENCOUNTER — Inpatient Hospital Stay (HOSPITAL_COMMUNITY): Payer: Medicare Other

## 2018-01-27 DIAGNOSIS — Z8249 Family history of ischemic heart disease and other diseases of the circulatory system: Secondary | ICD-10-CM

## 2018-01-27 DIAGNOSIS — E785 Hyperlipidemia, unspecified: Secondary | ICD-10-CM | POA: Diagnosis present

## 2018-01-27 DIAGNOSIS — Z853 Personal history of malignant neoplasm of breast: Secondary | ICD-10-CM | POA: Diagnosis not present

## 2018-01-27 DIAGNOSIS — I619 Nontraumatic intracerebral hemorrhage, unspecified: Secondary | ICD-10-CM | POA: Diagnosis not present

## 2018-01-27 DIAGNOSIS — C7931 Secondary malignant neoplasm of brain: Principal | ICD-10-CM | POA: Diagnosis present

## 2018-01-27 DIAGNOSIS — I10 Essential (primary) hypertension: Secondary | ICD-10-CM | POA: Diagnosis present

## 2018-01-27 DIAGNOSIS — I614 Nontraumatic intracerebral hemorrhage in cerebellum: Secondary | ICD-10-CM | POA: Diagnosis present

## 2018-01-27 DIAGNOSIS — C155 Malignant neoplasm of lower third of esophagus: Secondary | ICD-10-CM | POA: Diagnosis present

## 2018-01-27 DIAGNOSIS — I161 Hypertensive emergency: Secondary | ICD-10-CM | POA: Diagnosis present

## 2018-01-27 DIAGNOSIS — Z9221 Personal history of antineoplastic chemotherapy: Secondary | ICD-10-CM

## 2018-01-27 DIAGNOSIS — G936 Cerebral edema: Secondary | ICD-10-CM | POA: Diagnosis present

## 2018-01-27 DIAGNOSIS — F17200 Nicotine dependence, unspecified, uncomplicated: Secondary | ICD-10-CM | POA: Diagnosis present

## 2018-01-27 DIAGNOSIS — Z923 Personal history of irradiation: Secondary | ICD-10-CM

## 2018-01-27 DIAGNOSIS — I70209 Unspecified atherosclerosis of native arteries of extremities, unspecified extremity: Secondary | ICD-10-CM | POA: Diagnosis present

## 2018-01-27 DIAGNOSIS — Z7952 Long term (current) use of systemic steroids: Secondary | ICD-10-CM | POA: Diagnosis not present

## 2018-01-27 DIAGNOSIS — B370 Candidal stomatitis: Secondary | ICD-10-CM | POA: Diagnosis not present

## 2018-01-27 DIAGNOSIS — Z8501 Personal history of malignant neoplasm of esophagus: Secondary | ICD-10-CM | POA: Diagnosis not present

## 2018-01-27 DIAGNOSIS — C50111 Malignant neoplasm of central portion of right female breast: Secondary | ICD-10-CM

## 2018-01-27 DIAGNOSIS — D72829 Elevated white blood cell count, unspecified: Secondary | ICD-10-CM | POA: Diagnosis not present

## 2018-01-27 DIAGNOSIS — Z825 Family history of asthma and other chronic lower respiratory diseases: Secondary | ICD-10-CM

## 2018-01-27 DIAGNOSIS — R42 Dizziness and giddiness: Secondary | ICD-10-CM

## 2018-01-27 DIAGNOSIS — Z808 Family history of malignant neoplasm of other organs or systems: Secondary | ICD-10-CM | POA: Diagnosis not present

## 2018-01-27 DIAGNOSIS — C159 Malignant neoplasm of esophagus, unspecified: Secondary | ICD-10-CM | POA: Diagnosis present

## 2018-01-27 DIAGNOSIS — Z79899 Other long term (current) drug therapy: Secondary | ICD-10-CM | POA: Diagnosis not present

## 2018-01-27 DIAGNOSIS — Z170 Estrogen receptor positive status [ER+]: Secondary | ICD-10-CM

## 2018-01-27 DIAGNOSIS — D490 Neoplasm of unspecified behavior of digestive system: Secondary | ICD-10-CM | POA: Diagnosis not present

## 2018-01-27 DIAGNOSIS — J449 Chronic obstructive pulmonary disease, unspecified: Secondary | ICD-10-CM | POA: Diagnosis present

## 2018-01-27 DIAGNOSIS — G939 Disorder of brain, unspecified: Secondary | ICD-10-CM | POA: Diagnosis not present

## 2018-01-27 DIAGNOSIS — C50911 Malignant neoplasm of unspecified site of right female breast: Secondary | ICD-10-CM | POA: Diagnosis not present

## 2018-01-27 DIAGNOSIS — R110 Nausea: Secondary | ICD-10-CM | POA: Diagnosis not present

## 2018-01-27 DIAGNOSIS — Z807 Family history of other malignant neoplasms of lymphoid, hematopoietic and related tissues: Secondary | ICD-10-CM

## 2018-01-27 DIAGNOSIS — Z6841 Body Mass Index (BMI) 40.0 and over, adult: Secondary | ICD-10-CM

## 2018-01-27 DIAGNOSIS — F17210 Nicotine dependence, cigarettes, uncomplicated: Secondary | ICD-10-CM | POA: Diagnosis present

## 2018-01-27 DIAGNOSIS — C50919 Malignant neoplasm of unspecified site of unspecified female breast: Secondary | ICD-10-CM | POA: Diagnosis not present

## 2018-01-27 LAB — CBC WITH DIFFERENTIAL/PLATELET
Abs Immature Granulocytes: 0.04 10*3/uL (ref 0.00–0.07)
BASOS PCT: 1 %
Basophils Absolute: 0.1 10*3/uL (ref 0.0–0.1)
EOS ABS: 0.3 10*3/uL (ref 0.0–0.5)
Eosinophils Relative: 3 %
HCT: 42.6 % (ref 36.0–46.0)
Hemoglobin: 13.5 g/dL (ref 12.0–15.0)
IMMATURE GRANULOCYTES: 0 %
Lymphocytes Relative: 17 %
Lymphs Abs: 1.5 10*3/uL (ref 0.7–4.0)
MCH: 29.8 pg (ref 26.0–34.0)
MCHC: 31.7 g/dL (ref 30.0–36.0)
MCV: 94 fL (ref 80.0–100.0)
Monocytes Absolute: 0.7 10*3/uL (ref 0.1–1.0)
Monocytes Relative: 7 %
NEUTROS PCT: 72 %
NRBC: 0 % (ref 0.0–0.2)
Neutro Abs: 6.5 10*3/uL (ref 1.7–7.7)
PLATELETS: 261 10*3/uL (ref 150–400)
RBC: 4.53 MIL/uL (ref 3.87–5.11)
RDW: 13.1 % (ref 11.5–15.5)
WBC: 9.1 10*3/uL (ref 4.0–10.5)

## 2018-01-27 LAB — COMPREHENSIVE METABOLIC PANEL
ALBUMIN: 3.4 g/dL — AB (ref 3.5–5.0)
ALT: 17 U/L (ref 0–44)
AST: 16 U/L (ref 15–41)
Alkaline Phosphatase: 92 U/L (ref 38–126)
Anion gap: 10 (ref 5–15)
BUN: 7 mg/dL — AB (ref 8–23)
CHLORIDE: 103 mmol/L (ref 98–111)
CO2: 23 mmol/L (ref 22–32)
Calcium: 9.5 mg/dL (ref 8.9–10.3)
Creatinine, Ser: 0.93 mg/dL (ref 0.44–1.00)
GFR calc Af Amer: >60 mL/min (ref >60)
Glucose, Bld: 105 mg/dL — ABNORMAL HIGH (ref 70–99)
POTASSIUM: 3.8 mmol/L (ref 3.5–5.1)
SODIUM: 136 mmol/L (ref 135–145)
Total Bilirubin: 0.5 mg/dL (ref 0.3–1.2)
Total Protein: 6.6 g/dL (ref 6.5–8.1)

## 2018-01-27 LAB — HIV ANTIBODY (ROUTINE TESTING W REFLEX): HIV Screen 4th Generation wRfx: NONREACTIVE

## 2018-01-27 LAB — PROTIME-INR
INR: 0.93
PROTHROMBIN TIME: 12.3 s (ref 11.4–15.2)

## 2018-01-27 LAB — HEMOGLOBIN A1C
Hgb A1c MFr Bld: 5.4 % (ref 4.8–5.6)
MEAN PLASMA GLUCOSE: 108.28 mg/dL

## 2018-01-27 LAB — MRSA PCR SCREENING: MRSA by PCR: NEGATIVE

## 2018-01-27 LAB — TSH: TSH: 2.327 u[IU]/mL (ref 0.350–4.500)

## 2018-01-27 LAB — CBG MONITORING, ED: Glucose-Capillary: 100 mg/dL — ABNORMAL HIGH (ref 70–99)

## 2018-01-27 MED ORDER — DEXAMETHASONE SODIUM PHOSPHATE 10 MG/ML IJ SOLN
20.0000 mg | INTRAMUSCULAR | Status: DC
  Administered 2018-01-27 – 2018-01-30 (×4): 20 mg via INTRAVENOUS
  Filled 2018-01-27 (×4): qty 2

## 2018-01-27 MED ORDER — HYDRALAZINE HCL 20 MG/ML IJ SOLN: 5.0000 mg | INTRAMUSCULAR | Status: DC | PRN

## 2018-01-27 MED ORDER — METOCLOPRAMIDE HCL 5 MG/5ML PO SOLN
10.0000 mg | Freq: Four times a day (QID) | ORAL | Status: DC | PRN
  Filled 2018-01-27: qty 10

## 2018-01-27 MED ORDER — ACETAMINOPHEN 650 MG RE SUPP: 650.0000 mg | RECTAL | Status: DC | PRN

## 2018-01-27 MED ORDER — ONDANSETRON HCL 4 MG PO TABS
8.0000 mg | ORAL_TABLET | Freq: Four times a day (QID) | ORAL | Status: DC | PRN
  Administered 2018-01-27: 8 mg via ORAL
  Filled 2018-01-27 (×2): qty 2

## 2018-01-27 MED ORDER — GADOBUTROL 1 MMOL/ML IV SOLN
9.8000 mL | Freq: Once | INTRAVENOUS | Status: AC | PRN
  Administered 2018-01-27: 9.8 mL via INTRAVENOUS

## 2018-01-27 MED ORDER — METOCLOPRAMIDE HCL 5 MG/ML IJ SOLN
10.0000 mg | Freq: Three times a day (TID) | INTRAMUSCULAR | Status: DC
  Administered 2018-01-27 – 2018-01-31 (×11): 10 mg via INTRAVENOUS
  Filled 2018-01-27 (×11): qty 2

## 2018-01-27 MED ORDER — CLEVIDIPINE BUTYRATE 0.5 MG/ML IV EMUL
0.0000 mg/h | INTRAVENOUS | Status: DC
  Administered 2018-01-27: 1 mg/h via INTRAVENOUS

## 2018-01-27 MED ORDER — CLEVIDIPINE BUTYRATE 0.5 MG/ML IV EMUL
INTRAVENOUS | Status: AC
  Filled 2018-01-27: qty 50

## 2018-01-27 MED ORDER — STROKE: EARLY STAGES OF RECOVERY BOOK
Freq: Once | Status: DC
  Filled 2018-01-27 (×2): qty 1

## 2018-01-27 MED ORDER — METOCLOPRAMIDE HCL 10 MG/10ML PO SOLN
10.0000 mg | Freq: Four times a day (QID) | ORAL | Status: DC | PRN
  Filled 2018-01-27: qty 10

## 2018-01-27 MED ORDER — PANTOPRAZOLE SODIUM 40 MG IV SOLR
40.0000 mg | Freq: Two times a day (BID) | INTRAVENOUS | Status: DC
  Administered 2018-01-27 – 2018-01-31 (×8): 40 mg via INTRAVENOUS
  Filled 2018-01-27 (×9): qty 40

## 2018-01-27 MED ORDER — ACETAMINOPHEN 160 MG/5ML PO SOLN: 650.0000 mg | ORAL | Status: DC | PRN

## 2018-01-27 MED ORDER — CLEVIDIPINE BUTYRATE 0.5 MG/ML IV EMUL
0.0000 mg/h | INTRAVENOUS | Status: DC
  Administered 2018-01-27: 14 mg/h via INTRAVENOUS
  Administered 2018-01-27: 12 mg/h via INTRAVENOUS
  Administered 2018-01-27: 15 mg/h via INTRAVENOUS
  Administered 2018-01-27: 16 mg/h via INTRAVENOUS
  Filled 2018-01-27 (×4): qty 50

## 2018-01-27 MED ORDER — ACETAMINOPHEN 325 MG PO TABS: 650.0000 mg | ORAL_TABLET | ORAL | Status: DC | PRN

## 2018-01-27 MED ORDER — PANTOPRAZOLE SODIUM 40 MG IV SOLR: 40.0000 mg | Freq: Every day | INTRAVENOUS | Status: DC

## 2018-01-27 MED ORDER — ONDANSETRON HCL 4 MG/2ML IJ SOLN: 4.0000 mg | Freq: Four times a day (QID) | INTRAMUSCULAR | Status: DC | PRN

## 2018-01-27 MED ORDER — SENNOSIDES-DOCUSATE SODIUM 8.6-50 MG PO TABS
1.0000 | ORAL_TABLET | Freq: Two times a day (BID) | ORAL | Status: DC
  Administered 2018-01-27 – 2018-01-30 (×7): 1 via ORAL
  Filled 2018-01-27 (×9): qty 1

## 2018-01-27 NOTE — Progress Notes (Signed)
Dr. Leonie Man change SBP goal to < 160.

## 2018-01-27 NOTE — ED Triage Notes (Signed)
Patient here sent by PCP.  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 Patient with continued dizziness since 10/27.  Patient has had nausea and dry heaves but none today.  Patient also recently diagnosed with CA breast on right.  Hx of Breast on the right 14 years ago.  Scheduled to have lumpectomy on the right on Tuesday.

## 2018-01-27 NOTE — Progress Notes (Signed)
Got report from day shift nurse that pt has brain mass but just here for observation and for oncology consult. MD at bedside talking with pt and family  .

## 2018-01-27 NOTE — Progress Notes (Signed)
Soon as MD left pt room heard pt started vomiting.  RN notified  Oncology  MD who saw pt few minutes  ago.   New orders given and carried out. Vs  Done. Plan of care explained to pt and family. RN will continue to monitor pt closely.

## 2018-01-27 NOTE — ED Provider Notes (Signed)
[facility] EMERGENCY DEPARTMENT Provider Note   CSN: [id number] Arrival date & time: 01/27/18  0229     History   Chief Complaint Chief Complaint  Patient presents with  . Dizziness    HPI Kristin Williams is a 72 y.o. female.  Recently diagnosed with recurrent breast cancer and also has masses in her esophagus. Vertigo for over a month, outpatient CT with cerebellar hemorrhage. No other neuro complaints.    Dizziness  Quality:  Vertigo Severity:  Moderate Onset quality:  Gradual Duration:  1 month Timing:  Constant Progression:  Worsening Chronicity:  New Context: bending over and head movement   Relieved by:  None tried Worsened by:  Nothing Ineffective treatments:  None tried Associated symptoms: no chest pain, no shortness of breath, no vomiting and no weakness     Past Medical History:  Diagnosis Date  . Arthritis   . Cancer (HCC)    Cervical  . Cancer ([place])    Breast  . Cancer ([place])    Vaginal  . Complication of anesthesia   . COPD (chronic obstructive pulmonary disease) ([place])   . Headache(784.0)   . Hyperlipidemia   . Leg pain   . Peripheral vascular disease ([place])   . Personal history of chemotherapy   . Personal history of radiation therapy   . PONV (postoperative nausea and vomiting)     Patient Active Problem List   Diagnosis Date Noted  . ICH (intracerebral hemorrhage) ([place]) 01/27/2018  . Aftercare following surgery of the circulatory system, [place] 12/06/2012  . 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 ABDOMINAL HYSTERECTOMY  1975  . BREAST LUMPECTOMY  2005  . GANGLION CYST EXCISION  1977  . ILIAC ARTERY STENT  04/20/2004   CSD right iliac occlusive disease     OB History   None      Home Medications    Prior to Admission medications   Medication Sig Start Date End Date Taking? Authorizing Provider  LORazepam (ATIVAN) 1 MG tablet Take 1 tablet (1  mg total) by mouth 3 (three) times daily as needed for anxiety. 01/08/18  Yes Nat Christen, MD  omeprazole (PRILOSEC) 20 MG capsule Take 40 mg by mouth daily.  09/09/10  Yes [provider]  ondansetron (ZOFRAN) 8 MG tablet Take 8 mg by mouth every 6 (six) hours as needed for nausea or vomiting.  01/06/18  Yes [provider]    Family History Family History  Problem Relation Age of Onset  . Cancer Mother        Non-Hodgkins Disease T-Cell  . Heart failure Father   . COPD Father   . Heart disease Brother 13       MI and Heart Disease before age 63  . Heart attack Brother   . Breast cancer Neg Hx     Social History Social History   Tobacco Use  . Smoking status: Current Every Day Smoker    Packs/day: 0.50    Years: 40.00    Pack years: 20.00    Types: Cigarettes  . Smokeless tobacco: Never Used  Substance Use Topics  . Alcohol use: No  . Drug use: No     Allergies   Flonase [fluticasone propionate]   Review of Systems Review of Systems  Respiratory: Negative for shortness of breath.   Cardiovascular: Negative for chest pain.  Gastrointestinal: Negative for vomiting.  Neurological: Positive for dizziness.  Negative for weakness.  All other systems reviewed and are negative.    Physical Exam Updated Vital Signs BP (!) 117/57 (BP Location: Right Arm)   Pulse 94   Temp 97.7 F (36.5 C) (Oral)   Resp 20   Ht 5' (1.524 m)   Wt 95.4 kg   SpO2 99%   BMI 41.08 kg/m   Physical Exam  Constitutional: She is oriented to person, place, and time. She appears well-developed and well-nourished.  HENT:  Head: Normocephalic and atraumatic.  Eyes: Conjunctivae and EOM are normal.  Neck: Normal range of motion.  Cardiovascular: Normal rate and regular rhythm.  Pulmonary/Chest: Effort normal and breath sounds normal. No stridor. No respiratory distress.  Abdominal: Soft. Bowel sounds are normal. She exhibits no distension.  Musculoskeletal: Normal range  of motion. She exhibits no edema or deformity.  Neurological: She is alert and oriented to person, place, and time. She displays normal reflexes. No cranial nerve deficit or sensory deficit. She exhibits normal muscle tone. Coordination normal.  Skin: Skin is warm and dry.  Nursing note and vitals reviewed.    ED Treatments / Results  Labs (all labs ordered are listed, but only abnormal results are displayed) Labs Reviewed  COMPREHENSIVE METABOLIC PANEL - Abnormal; Notable for the following components:      Result Value   Glucose, Bld 105 (*)    BUN 7 (*)    Albumin 3.4 (*)    All other components within normal limits  CBG MONITORING, ED - Abnormal; Notable for the following components:   Glucose-Capillary 100 (*)    All other components within normal limits  MRSA PCR SCREENING  CBC WITH DIFFERENTIAL/PLATELET  PROTIME-INR  HIV ANTIBODY (ROUTINE TESTING W REFLEX)    EKG EKG Interpretation  Date/Time:  Friday January 27 2018 02:39:03 EST Ventricular Rate:  82 PR Interval:    QRS Duration: 89 QT Interval:  374 QTC Calculation: 437 R Axis:   48 Text Interpretation:  Sinus rhythm No significant change since last tracing Confirmed by Merrily Pew [phone]) on 01/27/2018 3:44:01 AM   Radiology Ct Head W & Wo Contrast  Addendum Date: 01/27/2018   ADDENDUM REPORT: 01/27/2018 01:36 ADDENDUM: Critical Value/emergent results were called by telephone at the time of interpretation on 01/27/2018 at 1:30 am to Dr. Anson Fret , who verbally acknowledged these results. Electronically Signed   By: Elon Alas M.D.   On: 01/27/2018 01:36   Result Date: 01/27/2018 CLINICAL DATA:  Vertigo and dizziness for 2 months. History of breast cancer and esophageal mass. EXAM: CT HEAD WITHOUT AND WITH CONTRAST TECHNIQUE: Contiguous axial images were obtained from the base of the skull through the vertex without and with intravenous contrast CONTRAST:  37mL ISOVUE-300 IOPAMIDOL (ISOVUE-300)  INJECTION 61% COMPARISON:  CT HEAD December 06, 2003 FINDINGS: BRAIN: 2.6 x 2.6 x 3 cm (volume = 10 cc) RIGHT parasagittal cerebellar hemorrhage with surrounding low-density vasogenic edema. Mild mass effect on fourth ventricle without hydrocephalus. No parenchymal brain volume loss for age. No abnormal intracranial enhancement. LEFT cerebellar developmental venous anomaly. No acute large vascular territory infarct. No abnormal extra-axial fluid collections. Basal cisterns are patent. VASCULAR: Unremarkable. SKULL: No skull fracture. No destructive bony lesions. 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 No significant scalp soft tissue swelling. SINUSES/ORBITS: Trace paranasal sinus mucosal thickening. Mastoid air cells are well aerated.The included ocular globes and orbital contents are non-suspicious. OTHER: None. IMPRESSION: 1. Acute 10 cc cerebellar hemorrhage with mild mass effect on fourth ventricle,  no hydrocephalus. 2. Otherwise negative CT HEAD with and without contrast. 3. Ordering clinician has been paged at time of study interpretation, awaiting return call. Electronically Signed: By: Elon Alas M.D. On: 01/27/2018 01:24   US Abdomen Complete  Result Date: 01/25/2018 CLINICAL DATA:  Abdominal pain with nausea and vomiting EXAM: ABDOMEN ULTRASOUND COMPLETE COMPARISON:  None. FINDINGS: Gallbladder: No gallstones or wall thickening visualized. There is no pericholecystic fluid. No sonographic Murphy sign noted by sonographer. Common bile duct: Diameter: 4 mm. No intrahepatic, common hepatic, or common bile duct dilatation. Liver: No focal lesion identified. Within normal limits in parenchymal echogenicity. Portal vein is patent on color Doppler imaging with normal direction of blood flow towards the liver. IVC: No abnormality visualized. Pancreas: Visualized portion unremarkable. Portions of pancreatic tail obscured by gas. Spleen: Size and appearance within normal limits.  Right Kidney: Length: 11.5 cm. Echogenicity within normal limits. No mass or hydronephrosis visualized. Left Kidney: Length: 11.2 cm. Echogenicity within normal limits. No hydronephrosis visualized. There is a cyst arising from the lower pole left kidney measuring 1.8 x 1.5 x 1.6 cm. Abdominal aorta: No aneurysm visualized. Other findings: No demonstrable ascites. IMPRESSION: 1. Portions of pancreas obscured by gas. Visualized portions of pancreas appear normal. 2.  Cyst arising from lower pole left kidney. 3.  Study otherwise unremarkable. Electronically Signed   By: Lowella Grip III M.D.   On: 01/25/2018 10:22    Procedures Procedures (including critical care time)  CRITICAL CARE Performed by: Merrily Pew Total critical care time: 35 minutes Critical care time was exclusive of separately billable procedures and treating other patients. Critical care was necessary to treat or prevent imminent or life-threatening deterioration. Critical care was time spent personally by me on the following activities: development of treatment plan with patient and/or surrogate as well as nursing, discussions with consultants, evaluation of patient's response to treatment, examination of patient, obtaining history from patient or surrogate, ordering and performing treatments and interventions, ordering and review of laboratory studies, ordering and review of radiographic studies, pulse oximetry and re-evaluation of patient's condition.   Medications Ordered in ED Medications   stroke: mapping our early stages of recovery book (has no administration in time range)  acetaminophen (TYLENOL) tablet 650 mg (has no administration in time range)    Or  acetaminophen (TYLENOL) solution 650 mg (has no administration in time range)    Or  acetaminophen (TYLENOL) suppository 650 mg (has no administration in time range)  senna-docusate (Senokot-S) tablet 1 tablet (has no administration in time range)  pantoprazole  (PROTONIX) injection 40 mg (has no administration in time range)  clevidipine (CLEVIPREX) infusion 0.5 mg/mL (16 mg/hr Intravenous New Bag/Given 01/27/18 0430)     Initial Impression / Assessment and Plan / ED Course  I have reviewed the triage vital signs and the nursing notes.  Pertinent labs & imaging results that were available during my care of the patient were reviewed by me and considered in my medical decision making (see chart for details).    Intracerebral hemorrhage. No blood thinners. Started cleviprex. Neuro consulted.   Neuro to admit.   Final Clinical Impressions(s) / ED Diagnoses   Final diagnoses:  Vertigo  Cerebellar hemorrhage [facility])    ED Discharge Orders    None       Broghan Pannone, Corene Cornea, MD 01/27/18 [phone]

## 2018-01-27 NOTE — Progress Notes (Signed)
Physical Therapy Treatment Patient Details Name: Kristin Williams MRN: [id number] DOB: Feb 01, 1946 Today's Date: 01/27/2018    History of Present Illness pt is a 72 y/o female with pmh significant for Cervical, vaginal and breat CA's, COPD, PVD admitted with 2 or more week h/o vertigo that fluctuated in intensity, but worsened afternoon of admission.  CT showed cerebellar hemmorhage.  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    PT Comments    Pt admitted with/for hemorrhaging cerebellar tumor per MRI.  Pt is presently experiencing various levels or vertigo and N/V.  Pt needs minimal assist only, but may need some follow up to help compensate for or habituate to the activities that cause vertigo.  Pt currently limited functionally due to the problems listed below.  (see problems list.)  Pt will benefit from PT to maximize function and safety to be able to get home safely with available assist.    Follow Up Recommendations  Outpatient PT;Other (comment)(or HHPT progressing to OPPT)     Equipment Recommendations  None recommended by PT    Recommendations for Other Services       Precautions / Restrictions Precautions Precautions: Fall    Mobility  Bed Mobility Overal bed mobility: Modified Independent             General bed mobility comments: slow movement, but still got nauseated.  Transfers Overall transfer level: Needs assistance Equipment used: None Transfers: Sit to/from Stand Sit to Stand: Min guard            Ambulation/Gait Ambulation/Gait assistance: Min assist;Min guard Gait Distance (Feet): 200 Feet Assistive device: None Gait Pattern/deviations: Step-through pattern Gait velocity: slower Gait velocity interpretation: <1.8 ft/sec, indicate of risk for recurrent falls General Gait Details: min to min guard.  pt using visual compensation to decrease veriginous s/s.  Some drift, but no overt  deviation or LOB   Stairs Stairs: Yes Stairs assistance: Min guard Stair Management: One rail Right;Alternating pattern;Forwards Number of Stairs: 9 General stair comments: safe with rail.  Visual input coming down made her more dizzy   Wheelchair Mobility    Modified Rankin (Stroke Patients Only) Modified Rankin (Stroke Patients Only) Pre-Morbid Rankin Score: No symptoms Modified Rankin: Moderate disability     Balance                                            Cognition Arousal/Alertness: Awake/alert Behavior During Therapy: WFL for tasks assessed/performed Overall Cognitive Status: Within Functional Limits for tasks assessed                                        Exercises      General Comments General comments (skin integrity, edema, etc.): Initial movement made pt very nauseous with 2 minutes of heaving       Pertinent Vitals/Pain Pain Assessment: No/denies pain    Home Living Family/patient expects to be discharged to:: Private residence Living Arrangements: Spouse/significant other Available Help at Discharge: Family Type of Home: House     Home Layout: Two level;Other (Comment)(bed upstairs, bath downstairs) Home Equipment: None      Prior Function Level of Independence: Independent          PT Goals (current goals can  now be found in the care plan section) Acute Rehab PT Goals Patient Stated Goal: deal with the nausea so I can deal with all the rest PT Goal Formulation: With patient Time For Goal Achievement: 02/10/18 Potential to Achieve Goals: Good    Frequency    Min 3X/week      PT Plan      Co-evaluation              AM-PAC PT "6 Clicks" Daily Activity  Outcome Measure  Difficulty turning over in bed (including adjusting bedclothes, sheets and blankets)?: A Little Difficulty moving from lying on back to sitting on the side of the bed? : A Little Difficulty sitting down on and standing  up from a chair with arms (e.g., wheelchair, bedside commode, etc,.)?: A Little Help needed moving to and from a bed to chair (including a wheelchair)?: A Little Help needed walking in hospital room?: A Little Help needed climbing 3-5 steps with a railing? : A Little 6 Click Score: 18    End of Session   Activity Tolerance: Patient tolerated treatment well Patient left: in chair;with call bell/phone within reach;with chair alarm set;with family/visitor present Nurse Communication: Mobility status PT Visit Diagnosis: Unsteadiness on feet (R26.81);Other abnormalities of gait and mobility (R26.89);Other symptoms and signs involving the nervous system (R29.898)     Time: 3291-9166 PT Time Calculation (min) (ACUTE ONLY): 41 min  Charges:  $Gait Training: 8-22 mins $Therapeutic Activity: 8-22 mins                     01/27/2018  Donnella Sham, [place] [phone]  (pager) [phone]  (office)   Kristin Williams 01/27/2018, 5:57 PM

## 2018-01-27 NOTE — H&P (Signed)
Neurology H&P  CC: Vertigo  History is obtained from: Patient  HPI: Kristin Williams is a 72 y.o. female with 2 weeks of vertigo as well as episode of worsening this afternoon.  She has been nauseated and having vertigo and therefore a CT scan was ordered by her PCP which was done as an outpatient today and revealed a fairly large cerebellar hemorrhage.   In the ER she was started on clevidipine for blood pressure control is currently requiring fairly high-dose been to keep her blood pressure less than 140.  LKW: 2 weeks ago tpa given?: no, ICH ICH Score: 1 Modified Rankin Scale: 0-Completely asymptomatic and back to baseline post- stroke  ROS: A 14 point ROS was performed and is negative except as noted in the HPI.   Past Medical History:  Diagnosis Date  . Arthritis   . Cancer (HCC)    Cervical  . Cancer ([place])    Breast  . Cancer ([place])    Vaginal  . Complication of anesthesia   . COPD (chronic obstructive pulmonary disease) ([place])   . Headache(784.0)   . Hyperlipidemia   . Leg pain   . Peripheral vascular disease ([place])   . Personal history of chemotherapy   . Personal history of radiation therapy   . PONV (postoperative nausea and vomiting)      Family History  Problem Relation Age of Onset  . Cancer Mother        Non-Hodgkins Disease T-Cell  . Heart failure Father   . COPD Father   . Heart disease Brother 32       MI and Heart Disease before age 58  . Heart attack Brother   . Breast cancer Neg Hx      Social History:  reports that she has been smoking cigarettes. She has a 20.00 pack-year smoking history. She has never used smokeless tobacco. She reports that she does not drink alcohol or use drugs.   Exam: Current vital signs: BP (!) 121/56   Pulse 92   Temp (!) 97.5 F (36.4 C) (Oral)   Resp 20   Wt 96.2 kg   SpO2 95%   BMI 41.40 kg/m  Vital signs in last 24 hours: Temp:  [97.5 F (36.4 C)] 97.5 F (36.4 C) (11/15 0239) Pulse Rate:  [71-95] 92  (11/15 0320) Resp:  [17-22] 20 (11/15 0320) BP: (116-164)/(56-86) 121/56 (11/15 0320) SpO2:  [93 %-98 %] 95 % (11/15 0320) Weight:  [96.2 kg] 96.2 kg (11/15 0308)  Physical Exam  Constitutional: Appears well-developed and well-nourished.  Psych: Affect appropriate to situation Eyes: No scleral injection HENT: No OP obstrucion Head: Normocephalic.  Cardiovascular: Normal rate and regular rhythm.  Respiratory: Effort normal and breath sounds normal to anterior ascultation GI: Soft.  No distension. There is no tenderness.  Skin: WDI  Neuro: Mental Status: Patient is awake, alert, oriented to person, place, month, year, and situation. Patient is able to give a clear and coherent history. No signs of aphasia or neglect Cranial Nerves: II: Visual Fields are full. Pupils are equal, round, and reactive to light.   III,IV, VI: EOMI without ptosis or diploplia.  V: Facial sensation is symmetric to temperature VII: Facial movement is symmetric.  VIII: hearing is intact to voice X: Uvula elevates symmetrically XI: Shoulder shrug is symmetric. XII: tongue is midline without atrophy or fasciculations.  Motor: Tone is normal. Bulk is normal. 5/5 strength was present in bilateral upper extremities and right lower extremity, 4/5 weakness  in left lower extremity Sensory: Sensation is symmetric to light touch and temperature in the arms and legs. Cerebellar: She has mild difficulty with finger-nose-finger on the right and mild difficulty on right leg  I have reviewed labs in epic and the results pertinent to this consultation are: CMP-unremarkable  I have reviewed the images obtained: CT head- cerebellar hemorrhage, I actually measured it as slightly less than 3 cm in the longest axis  Primary Diagnosis:  intracranial hemorrhage   Secondary Diagnosis: Accelerated hypertension(SBP > 180 or DBP > 11) & end organ damage) Hypertensive emergency  Impression: 72 year old female with  cerebellar hemorrhage.  With her worsening today as well as with requiring high dose of clevidipine I do think that an ICU admission for stabilization would be appropriate.  Possible etiologies include hemorrhagic metastasis, ischemic stroke with hemorrhagic conversion, hypertensive hemorrhage.  Though she is at the cusp where surgical intervention is sometimes considered, with her duration of symptoms and relatively benign exam, I do not think I would favor pursuing surgery unless she were to worsen.  Recommendations: 1) Admit to ICU 2) no antiplatelets or anticoagulants 3) blood pressure control with goal systolic 017 - 510, clevidipine 4) Frequent neuro checks 5) If symptoms worsen or there is decreased mental status, repeat stat head CT 6) PT,OT,ST   This patient is critically ill and at significant risk of neurological worsening, death and care requires constant monitoring of vital signs, hemodynamics,respiratory and cardiac monitoring, neurological assessment, discussion with family, other specialists and medical decision making of high complexity. I spent 60 minutes of neurocritical care time  in the care of  this patient.  Roland Rack, MD [facility] [phone]  If 7pm- 7am, please page neurology on call as listed in [place]. 01/27/2018  3:59 AM

## 2018-01-27 NOTE — Progress Notes (Signed)
STROKE TEAM PROGRESS NOTE   HISTORY OF PRESENT ILLNESS (per record) Kristin Williams is a 72 y.o. female with 2 weeks of vertigo as well as episode of worsening this afternoon. She has been nauseated and having vertigo and therefore a CT scan was ordered by her PCP which was done as an outpatient today and revealed a fairly large cerebellar hemorrhage.    In the ER she was started on clevidipine for blood pressure control is currently requiring fairly high-dose been to keep her blood pressure less than 140.  LKW: 2 weeks ago tpa given?: no, ICH ICH Score: 1 Modified Rankin Scale: 0-Completely asymptomatic and back to baseline post- stroke   SUBJECTIVE (INTERVAL HISTORY) Multiple family members present. Dr Leonie Man discussed need for MRI Brain W and WO Contrast. Pt requests that Oncology (Dr Jana Hakim) be contacted regarding her admission and further plans for surgery etc.    OBJECTIVE Vitals:   01/27/18 1100 01/27/18 1200 01/27/18 1345 01/27/18 1400  BP: (!) 103/49 113/62 (!) 116/49 119/63  Pulse: 86 69 67 86  Resp: 18 16 16 20   Temp:      TempSrc:      SpO2: 96% 92% 96% 96%  Weight:      Height:        CBC:  Recent Labs  Lab 01/27/18 0253  WBC 9.1  NEUTROABS 6.5  HGB 13.5  HCT 42.6  MCV 94.0  PLT 161    Basic Metabolic Panel:  Recent Labs  Lab 01/27/18 0253  NA 136  K 3.8  CL 103  CO2 23  GLUCOSE 105*  BUN 7*  CREATININE 0.93  CALCIUM 9.5    Lipid Panel: No results found for: CHOL, TRIG, HDL, CHOLHDL, VLDL, LDLCALC HgbA1c: No results found for: HGBA1C Urine Drug Screen: No results found for: LABOPIA, COCAINSCRNUR, LABBENZ, AMPHETMU, THCU, LABBARB  Alcohol Level No results found for: [facility]  IMAGING   Ct Head W & Wo Contrast  01/27/2018   IMPRESSION:  1. Acute 10 cc cerebellar hemorrhage with mild mass effect on fourth ventricle, no hydrocephalus.  2. Otherwise negative CT HEAD with and without contrast.    US Abdomen Complete 01/25/2018 IMPRESSION:   1. Portions of pancreas obscured by gas. Visualized portions of pancreas appear normal.  2.  Cyst arising from lower pole left kidney.  3.  Study otherwise unremarkable.    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   PHYSICAL EXAM Blood pressure 119/63, pulse 86, temperature 97.8 F (36.6 C), temperature source Oral, resp. rate 20, height 5' (1.524 m), weight 95.4 kg, SpO2 96 %.  [name] not in distress.  . Afebrile. Head is nontraumatic. Neck is supple without bruit.    Cardiac exam no murmur or gallop. Lungs are clear to auscultation. Distal pulses are well felt.  Neurological Exam ;  Awake  Alert oriented x 3. Normal speech and language.eye movements full without nystagmus But mild saccadic dysmetria on bilateral horizontal gaze..fundi were not visualized. Vision acuity and fields appear normal. Hearing is normal. Palatal movements are normal. Face symmetric. Tongue midline. Normal strength, tone, reflexes and coordination. Normal sensation. Gait deferred.    ASSESSMENT/PLAN Kristin Williams is a 72 y.o. female with history of breast Ca, ASPVD, HLD, tobacco use, and COPD presenting with nausea and vertigo . She did not receive IV t-PA due to hemorrhage.  Cerebellar hemorrhage.    Resultant  Headache and nausea only no focal findings  CT head - Acute 10 cc cerebellar hemorrhage with mild mass effect on fourth ventricle, no hydrocephalus.   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 (contacted Dr Magrinat's office this AM. Await return call)  Consider resuming home ativan dose.  Possible discharge tomorrow.    Hospital day # 0  Mikey Bussing PA-C [facility] Pager [phone] 01/27/2018, 3:00 PM I have personally examined this patient, reviewed notes, independently viewed imaging studies, participated in medical decision making and plan of care.ROS completed by me personally and pertinent positives fully documented  I have made any additions or clarifications directly to the above note. Agree with note above. She has presented with two-week history of headaches and nausea without significant focal findings. CT scan and MRI showed deep right cerebellar 2.7 cm hemorrhagic lesion likely a solitary metastasis. Etiology unclear related to breast  Or esophageal cancer.continue strict blood pressure control. Mobilize out of bed. Repeat consult. Transfer to neurology floor bed. I spoke to oncologist on call Dr. And never who will let patient's oncologist Dr. Jana Hakim who is on-call for the weekend and will see the patient. I also consulted neurosurgeon on call Dr. Venetia Constable will also see the patient to discuss surgical options. This patient is critically ill and at significant risk of neurological worsening, death and care requires constant monitoring of vital signs, hemodynamics,respiratory and cardiac monitoring, extensive review of multiple databases, frequent neurological  assessment, discussion with family, other specialists and medical decision making of high complexity.I have made any additions or clarifications directly to the above note.This critical care time does not reflect procedure time, or teaching time or supervisory time of PA/NP/Med Resident etc but could involve care discussion time.  I spent 30 minutes of neurocritical care time  in the care of  this patient.     Antony Contras, MD Medical Director [facility] Pager: [phone] 01/27/2018 4:58 PM   To contact Stroke Continuity provider, please refer to [email or web address] After hours, contact General Neurology

## 2018-01-27 NOTE — Consult Note (Signed)
Referral MD  Reason for Referral: Cerebellar mass-metastatic breast cancer versus metastatic esophageal cancer  Chief Complaint  Patient presents with  . Dizziness  : I have not been able to eat for about a month because I get sick.  HPI: Kristin Williams is a very charming 72 year old white female.  She has been known to Dr. Jana Hakim in the past.  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  She had a lumpectomy.  She had chemotherapy and radiation therapy afterwards.  Her tumor was I believe triple negative.  She has been doing well until recently.  She began to have some issues with nausea and vomiting.  She was having some difficulties swallowing.  In October, she had a CT of the abdomen and pelvis.  This really was unremarkable.  There was a 1.7 cm left adrenal nodule.  There is normal bowel.  There is no masses.  Liver looked okay.  She had a mammogram in October.  This showed a possible mass in the right breast.  She continued to worsen.  She was having some vertigo and dizziness.  She had a follow-up ultrasound on 01/04/2018.  This showed a mass in the right breast at the 6 o'clock position.  It measured about 6 mm.  It was 2 cm from the nipple.  On 01/10/2018, she had a biopsy.  The pathology report showed a infiltrating ductal carcinoma.  It was ER positive and PR positive.  It was HER-2 negative.  She has been seen recently by Dr. Penelope Coop.  She apparently underwent an EGD.  Although I do not have any reports in the computer, the patient and family report that he found that she had tumors in her esophagus.  She has now developed problems with vertigo and dizziness.  She had a CT of the brain done on 01/26/2018.  This showed a 2.6 x 2.6 x 3 cm right parasagittal cerebellar hemorrhage.  There is mild mass-effect on the fourth ventricle.  She was told to go to the emergency room.  She went to the emergency room at [facility].  She finally had an MRI of the  brain on 01/27/2018.  Shockingly, this showed that there is actual mass in the cerebellum.  This measured 2.9 x 2.7 x 2.5 cm.  It was felt that this was consistent with a solitary CNS metastasis.  We were called to see how we could help out.  She is clearly symptomatic from this mass.  She is not yet on steroids.  I will start her on some IV Decadron.  I believe that the solitary brain met should be resected.  It is fairly significant in size.  We really need to know what the histology of this mass is.  The histology of this cerebellar mass will clearly dictate her overall prognosis and will dictate how she is treated with respect to her esophageal cancer and her breast cancer.  Of note, she did have a CT scan of the chest.  This was done on 01/26/2018.  This showed a large mass involving the distal half of the esophagus spanning 11.7 cm.  There was some small mediastinal and right paratracheal lymph node.  There is no obvious pulmonary metastasis or hepatic metastasis.  She has had some headaches.  She has had no mouth sores.  She has had no cough.  She is a lifetime smoker.  I do not think there is any alcohol use.  There is no obvious occupational exposures.  Overall, I think she has a pretty decent performance status.  I would say her performance status is ECOG 1.     Past Medical History:  Diagnosis Date  . Arthritis   . Cancer (HCC)    Cervical  . Cancer ([place])    Breast  . Cancer ([place])    Vaginal  . Complication of anesthesia   . COPD (chronic obstructive pulmonary disease) ([place])   . Headache(784.0)   . Hyperlipidemia   . Leg pain   . Peripheral vascular disease ([place])   . Personal history of chemotherapy   . Personal history of radiation therapy   . PONV (postoperative nausea and vomiting)   :  Past Surgical History:  Procedure Laterality Date  . ABDOMINAL HYSTERECTOMY  1975  . BREAST LUMPECTOMY  2005  . GANGLION CYST EXCISION  1977  . ILIAC ARTERY STENT  04/20/2004    CSD right iliac occlusive disease  :   Current Facility-Administered Medications:  .   stroke: mapping our early stages of recovery book, , Does not apply, Once, Garvin Fila, MD .  acetaminophen (TYLENOL) tablet 650 mg, 650 mg, Oral, Q4H PRN **OR** acetaminophen (TYLENOL) solution 650 mg, 650 mg, Per Tube, Q4H PRN **OR** acetaminophen (TYLENOL) suppository 650 mg, 650 mg, Rectal, Q4H PRN, Antony Contras S, MD .  clevidipine (CLEVIPREX) infusion 0.5 mg/mL, 0-21 mg/hr, Intravenous, Continuous, Garvin Fila, MD, Stopped at 01/27/18 1211 .  dexamethasone (DECADRON) injection 20 mg, 20 mg, Intravenous, Q24H, Ennever, Rudell Cobb, MD .  hydrALAZINE (APRESOLINE) injection 5 mg, 5 mg, Intravenous, Q4H PRN, Garvin Fila, MD .  metoCLOPramide (REGLAN) 10 MG/10ML solution 10 mg, 10 mg, Oral, Q6H PRN, Garvin Fila, MD .  metoCLOPramide (REGLAN) injection 10 mg, 10 mg, Intravenous, Q8H, Ennever, Peter R, MD .  ondansetron (ZOFRAN) tablet 8 mg, 8 mg, Oral, Q6H PRN, Garvin Fila, MD, 8 mg at 01/27/18 1153 .  pantoprazole (PROTONIX) injection 40 mg, 40 mg, Intravenous, Q12H, Ennever, Rudell Cobb, MD .  senna-docusate (Senokot-S) tablet 1 tablet, 1 tablet, Oral, BID, Garvin Fila, MD, 1 tablet at 01/27/18 1008:  .  stroke: mapping our early stages of recovery book   Does not apply Once  . dexamethasone  20 mg Intravenous Q24H  . metoCLOPramide (REGLAN) injection  10 mg Intravenous Q8H  . pantoprazole (PROTONIX) IV  40 mg Intravenous Q12H  . senna-docusate  1 tablet Oral BID  :  Allergies  Allergen Reactions  . Flonase [Fluticasone Propionate] Other (See Comments)    Nose bleeds  :  Family History  Problem Relation Age of Onset  . Cancer Mother        Non-Hodgkins Disease T-Cell  . Heart failure Father   . COPD Father   . Heart disease Brother 23       MI and Heart Disease before age 16  . Heart attack Brother   . Breast cancer Neg Hx   :  Social History   Socioeconomic History   . Marital status: Married    Spouse name: Not on file  . Number of children: Not on file  . Years of education: Not on file  . Highest education level: Not on file  Occupational History  . Not on file  Social Needs  . Financial resource strain: Not on file  . Food insecurity:    Worry: Not on file    Inability: Not on file  .  Transportation needs:    Medical: Not on file    Non-medical: Not on file  Tobacco Use  . Smoking status: Current Every Day Smoker    Packs/day: 0.50    Years: 40.00    Pack years: 20.00    Types: Cigarettes  . Smokeless tobacco: Never Used  Substance and Sexual Activity  . Alcohol use: No  . Drug use: No  . Sexual activity: Not on file  Lifestyle  . Physical activity:    Days per week: Not on file    Minutes per session: Not on file  . Stress: Not on file  Relationships  . Social connections:    Talks on phone: Not on file    Gets together: Not on file    Attends religious service: Not on file    Active member of club or organization: Not on file    Attends meetings of clubs or organizations: Not on file    Relationship status: Not on file  . Intimate partner violence:    Fear of current or ex partner: Not on file    Emotionally abused: Not on file    Physically abused: Not on file    Forced sexual activity: Not on file  Other Topics Concern  . Not on file  Social History Narrative  . Not on file  :  Review of Systems  Constitutional: Positive for malaise/fatigue.  HENT: Negative.   Eyes: Positive for blurred vision.  Respiratory: Positive for cough.   Cardiovascular: Negative.   Gastrointestinal: Positive for nausea and vomiting.  Genitourinary: Negative.   Musculoskeletal: Negative.   Skin: Negative.   Neurological: Positive for dizziness and headaches.  Endo/Heme/Allergies: Negative.      Exam: Obese white female in no obvious distress.  Head and neck exam shows no ocular or oral lesions.  She has relatively reactive pupils.   There is no nystagmus.  Her extraocular muscles are intact.  She has no oral lesions.  She has no thrush.  There is no obvious adenopathy in the neck.  Lungs are clear bilaterally.  Cardiac exam regular rate and rhythm with no murmurs, rubs or bruits.  Abdomen is soft.  She is obese.  There is no fluid wave.  There is no palpable liver or spleen tip.  Extremities shows no clubbing, cyanosis or edema.  She has decent strength bilaterally.  She may have some slight edema in her legs.  Neurological exam shows no gross neurological deficits. Patient Vitals for the past 24 hrs:  BP Temp Temp src Pulse Resp SpO2 Height Weight  01/27/18 1600 (!) 98/43 97.7 F (36.5 C) Oral 87 18 96 % - -  01/27/18 1500 132/72 - - 81 (!) 21 96 % - -  01/27/18 1400 119/63 - - 86 20 96 % - -  01/27/18 1345 (!) 116/49 - - 67 16 96 % - -  01/27/18 1200 113/62 - - 69 16 92 % - -  01/27/18 1100 (!) 103/49 - - 86 18 96 % - -  01/27/18 1000 138/61 - - 85 19 94 % - -  01/27/18 0900 (!) 117/59 - - 85 (!) 22 95 % - -  01/27/18 0800 132/64 97.8 F (36.6 C) Oral 94 19 97 % - -  01/27/18 0700 119/63 - - 72 16 94 % - -  01/27/18 0600 138/69 - - 86 20 96 % - -  01/27/18 0500 131/71 - - 83 17 92 % - -  01/27/18  0443 (!) 117/57 97.7 F (36.5 C) Oral - - 99 % 5' (1.524 m) 210 lb 5.1 oz (95.4 kg)  01/27/18 0404 (!) 134/58 - - 94 20 92 % - -  01/27/18 0333 (!) 125/53 - - 90 20 94 % - -  01/27/18 0330 (!) 119/46 - - 90 (!) 21 98 % - -  01/27/18 0320 (!) 121/56 - - 92 20 95 % - -  01/27/18 0317 134/67 - - 86 18 97 % - -  01/27/18 0316 134/67 - - 88 17 94 % - -  01/27/18 0314 (!) 147/70 - - 82 17 97 % - -  01/27/18 0312 (!) 151/70 - - 74 20 98 % - -  01/27/18 0310 (!) 150/72 - - 71 20 96 % - -  01/27/18 0308 - - - - - - - 212 lb (96.2 kg)  01/27/18 0308 116/86 - - 95 18 98 % - -  01/27/18 0306 (!) 145/70 - - 74 19 98 % - -  01/27/18 0305 (!) 146/67 - - 78 (!) 22 93 % - -  01/27/18 0304 (!) 146/67 - - 75 19 94 % - -  01/27/18 0239  (!) 164/65 (!) 97.5 F (36.4 C) Oral 79 20 98 % - -     Recent Labs    01/27/18 0253  WBC 9.1  HGB 13.5  HCT 42.6  PLT 261   Recent Labs    01/27/18 0253  NA 136  K 3.8  CL 103  CO2 23  GLUCOSE 105*  BUN 7*  CREATININE 0.93  CALCIUM 9.5    Blood smear review: None  Pathology: None    Assessment and Plan: Kristin Williams is a very nice 72 year old white female.  She has a past history of an early stage ER negative/PR negative breast cancer back in 2004.  She was treated with lumpectomy followed by radiation and chemotherapy.  She now has what appears to be 2 new primaries.  She has an esophageal lesion.  Given that this is in the distal esophagus, I would have to think this is an adenocarcinoma.  She also has a right breast cancer.  This is only 6 mm in size.  She is clearly symptomatic from this cerebellar metastasis.  Given that she really does not have obvious systemic metastatic disease from the esophageal cancer or breast cancer, I really believe that this solitary CNS metastasis needs to be resected.  I think this is really affecting her quality of life.  I believe that resecting this if possible, would really improve her outlook and would improve her quality of life.  I think it is important that this cerebellar mass be resected so that we will know what the histology is.  I would have to believe that this is going to be esophageal cancer.  However, one cannot totally discount the possibility of metastatic breast cancer given the fact that breast cancer can certainly recur I had any time after 15-20 years.  I think that if we are looking at metastatic adenocarcinoma from the esophagus, this would negate her from having surgery.  As such, I would then consider chemotherapy and possibly radiation therapy depending on how much radiation therapy the esophagus received back 14 years ago.  If this happens to be metastatic breast cancer, then you would be looking at a whole  different set of treatment recommendations and prognostic issues.  She clearly will need radiation therapy for the cerebellar metastasis, regardless  of whether or not she has this resected.  I am not sure if this cerebellar met would be amenable to [facility].  I believe that neurosurgery needs to be involved.  I believe that they need to consider resecting out this tumor in the cerebellum.  Again I believe that by resecting this tumor, Kristin Williams's quality of life will improve dramatically.  This is a very complicated case.  I spent a good hour and a half with Kristin Williams and her family.  Since they have known to Dr. Jana Hakim, I will see if he can follow her along.  He is actually working this weekend.  They like and have confidence in Dr. Jana Hakim.  I appreciate the opportunity of seeing Kristin Williams.   Lattie Haw, MD  Jeneen Rinks 1:5-7

## 2018-01-28 ENCOUNTER — Inpatient Hospital Stay (HOSPITAL_COMMUNITY): Payer: Medicare Other

## 2018-01-28 DIAGNOSIS — I614 Nontraumatic intracerebral hemorrhage in cerebellum: Secondary | ICD-10-CM

## 2018-01-28 DIAGNOSIS — I10 Essential (primary) hypertension: Secondary | ICD-10-CM

## 2018-01-28 DIAGNOSIS — C50919 Malignant neoplasm of unspecified site of unspecified female breast: Secondary | ICD-10-CM

## 2018-01-28 DIAGNOSIS — G936 Cerebral edema: Secondary | ICD-10-CM

## 2018-01-28 DIAGNOSIS — C159 Malignant neoplasm of esophagus, unspecified: Secondary | ICD-10-CM

## 2018-01-28 LAB — LIPID PANEL
Cholesterol: 203 mg/dL — ABNORMAL HIGH (ref 0–200)
HDL: 59 mg/dL (ref >40)
LDL CALC: 128 mg/dL — AB (ref 0–99)
TRIGLYCERIDES: 79 mg/dL (ref <150)
Total CHOL/HDL Ratio: 3.4 RATIO
VLDL: 16 mg/dL (ref 0–40)

## 2018-01-28 LAB — BASIC METABOLIC PANEL
ANION GAP: 6 (ref 5–15)
BUN: 8 mg/dL (ref 8–23)
CO2: 28 mmol/L (ref 22–32)
Calcium: 9.9 mg/dL (ref 8.9–10.3)
Chloride: 102 mmol/L (ref 98–111)
Creatinine, Ser: 1.03 mg/dL — ABNORMAL HIGH (ref 0.44–1.00)
GFR, EST NON AFRICAN AMERICAN: 54 mL/min — AB (ref >60)
Glucose, Bld: 159 mg/dL — ABNORMAL HIGH (ref 70–99)
Potassium: 4.9 mmol/L (ref 3.5–5.1)
Sodium: 136 mmol/L (ref 135–145)

## 2018-01-28 LAB — ECHOCARDIOGRAM COMPLETE
Height: 60 in
Weight: 3365.1 oz

## 2018-01-28 LAB — CBC
HEMATOCRIT: 42.5 % (ref 36.0–46.0)
Hemoglobin: 13.5 g/dL (ref 12.0–15.0)
MCH: 29.3 pg (ref 26.0–34.0)
MCHC: 31.8 g/dL (ref 30.0–36.0)
MCV: 92.4 fL (ref 80.0–100.0)
Platelets: 329 10*3/uL (ref 150–400)
RBC: 4.6 MIL/uL (ref 3.87–5.11)
RDW: 13 % (ref 11.5–15.5)
WBC: 6.1 10*3/uL (ref 4.0–10.5)
nRBC: 0 % (ref 0.0–0.2)

## 2018-01-28 LAB — VITAMIN B12: Vitamin B-12: 642 pg/mL (ref 180–914)

## 2018-01-28 MED ORDER — LORAZEPAM 2 MG/ML IJ SOLN
0.5000 mg | Freq: Three times a day (TID) | INTRAMUSCULAR | Status: DC | PRN
  Administered 2018-01-28: 0.5 mg via INTRAVENOUS
  Filled 2018-01-28: qty 1

## 2018-01-28 NOTE — Progress Notes (Signed)
  Echocardiogram 2D Echocardiogram has been performed.  Kristin Williams F 01/28/2018, 2:18 PM

## 2018-01-28 NOTE — Progress Notes (Signed)
Pt. verbalized some relief of nausea but c/o dizziness  ,family at bedside brought CPAP from home and pt about to put it on. No acute distress now,educated on fall precaution .  Call bell within pt reach to call ,bed alarm on and in use.

## 2018-01-28 NOTE — Progress Notes (Signed)
Physical Therapy Treatment Patient Details Name: Kristin Williams MRN: [id number] DOB: 25-Jul-1945 Today's Date: 01/28/2018    History of Present Illness Pt is a 72 y/o female with PMH significant for Cervical, vaginal and breast CA's, COPD, PVD admitted with 2 or more week h/o vertigo that fluctuated in intensity, but worsened afternoon of admission.  CT showed cerebellar hemmorhage.  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    PT Comments    Pt making steady progress with functional mobility. Continue to recommend OP PT follow up to address higher level balance. Pt would continue to benefit from skilled physical therapy services at this time while admitted and after d/c to address the below listed limitations in order to improve overall safety and independence with functional mobility.    Follow Up Recommendations  Outpatient PT     Equipment Recommendations  None recommended by PT    Recommendations for Other Services       Precautions / Restrictions Precautions Precautions: Fall Restrictions Weight Bearing Restrictions: No    Mobility  Bed Mobility               General bed mobility comments: pt standing at sink with nurse tech upon arrival  Transfers Overall transfer level: Needs assistance Equipment used: None Transfers: Sit to/from Stand Sit to Stand: Supervision         General transfer comment: supervision for safety  Ambulation/Gait Ambulation/Gait assistance: Min guard Gait Distance (Feet): 200 Feet Assistive device: None Gait Pattern/deviations: Step-through pattern;Decreased stride length;Drifts right/left Gait velocity: decreased   General Gait Details: min guard for safety and pt occasionally reaching for hand rail in hallway; two standing breaks secondary to dizziness but no overt LOB or need for physical assistance   Stairs             Wheelchair Mobility    Modified  Rankin (Stroke Patients Only) Modified Rankin (Stroke Patients Only) Pre-Morbid Rankin Score: No symptoms Modified Rankin: Moderate disability     Balance Overall balance assessment: Needs assistance Sitting-balance support: Feet supported Sitting balance-Leahy Scale: Good     Standing balance support: No upper extremity supported Standing balance-Leahy Scale: Fair               High level balance activites: Side stepping;Backward walking;Turns High Level Balance Comments: close min guard for turns and side stepping; min A for backwards walking with use of one handrail            Cognition Arousal/Alertness: Awake/alert Behavior During Therapy: WFL for tasks assessed/performed Overall Cognitive Status: Within Functional Limits for tasks assessed                                        Exercises      General Comments        Pertinent Vitals/Pain Pain Assessment: No/denies pain    Home Living                      Prior Function            PT Goals (current goals can now be found in the care plan section) Acute Rehab PT Goals PT Goal Formulation: With patient Time For Goal Achievement: 02/10/18 Potential to Achieve Goals: Good Progress towards PT goals: Progressing toward goals    Frequency    Min 3X/week  PT Plan Current plan remains appropriate    Co-evaluation              AM-PAC PT "6 Clicks" Daily Activity  Outcome Measure  Difficulty turning over in bed (including adjusting bedclothes, sheets and blankets)?: A Little Difficulty moving from lying on back to sitting on the side of the bed? : A Little Difficulty sitting down on and standing up from a chair with arms (e.g., wheelchair, bedside commode, etc,.)?: A Little Help needed moving to and from a bed to chair (including a wheelchair)?: A Little Help needed walking in hospital room?: A Little Help needed climbing 3-5 steps with a railing? : A Little 6  Click Score: 18    End of Session Equipment Utilized During Treatment: Gait belt Activity Tolerance: Patient tolerated treatment well Patient left: in bed;with call bell/phone within reach;with family/visitor present Nurse Communication: Mobility status PT Visit Diagnosis: Unsteadiness on feet (R26.81);Other abnormalities of gait and mobility (R26.89);Other symptoms and signs involving the nervous system (R29.898)     Time: 5573-2202 PT Time Calculation (min) (ACUTE ONLY): 23 min  Charges:  $Gait Training: 8-22 mins $Therapeutic Activity: 8-22 mins                     Sherie Don, [place], DPT  Acute Rehabilitation Services Pager [phone] Office [place] 01/28/2018, 11:35 AM

## 2018-01-28 NOTE — Progress Notes (Signed)
Appreciate learning  Kristin Williams   DOB:06-16-1945   YN#:[id number]   QMV#:[id number]  Subjective:  Met with patient, husband Joneen Boers, daughter Maudie Mercury, son Lanny Hurst, patient's daughter in law and a neighbor; reviewed recent history of 30 lb weight loss, dysphagia, and lack of response to PPIs; as well as more recent history of "vertigo," nausea and vomiting; reviewed results of recent scans, and results of recent mammography showing a small estrogen receptor positive breast cancer. --  Today patient says she feels better (on steroids); nauseated but not vomiting; denies hemoptysis, cough, pleurisy, or change in bowel or bladder habits; had a very good PS until very recently when "vertigo" affected her balance.   Objective: middle aged White woman examined at bedside Vitals:   01/28/18 0829 01/28/18 1200  BP: 128/77 129/66  Pulse: 72 78  Resp: 16 17  Temp: 98.3 F (36.8 C) 98.4 F (36.9 C)  SpO2: 95% 98%    Body mass index is 41.08 kg/m.  Intake/Output Summary (Last 24 hours) at 01/28/2018 1543 Last data filed at 01/28/2018 0600 Gross per 24 hour  Intake 150 ml  Output -  Net 150 ml     Sclerae unicteric  No cervical or supraclavicular adenopathy  Lungs no rales or wheezes  Heart regular rate and rhythm  Abdomen soft, +BS  Neuro nonfocal  Breast exam: deferred  CBG (last 3)  Recent Labs    01/27/18 0245  GLUCAP 100*     Labs:  Lab Results  Component Value Date   WBC 6.1 01/28/2018   HGB 13.5 01/28/2018   HCT 42.5 01/28/2018   MCV 92.4 01/28/2018   PLT 329 01/28/2018   NEUTROABS 6.5 01/27/2018    _0 @  Urine Studies No results for input(s): UHGB, CRYS in the last 72 hours.  Invalid input(s): UACOL, UAPR, USPG, UPH, UTP, UGL, UKET, UBIL, UNIT, UROB, ULEU, UEPI, UWBC, URBC, UBAC, CAST, UCOM, BILUA  Basic Metabolic Panel: Recent Labs  Lab 01/27/18 0253 01/28/18 0501  NA 136 136  K 3.8 4.9  CL 103 102  CO2 23 28  GLUCOSE 105* 159*  BUN 7* 8   CREATININE 0.93 1.03*  CALCIUM 9.5 9.9   GFR Estimated Creatinine Clearance: 52.6 mL/min (A) (by C-G formula based on SCr of 1.03 mg/dL (H)). Liver Function Tests: Recent Labs  Lab 01/27/18 0253  AST 16  ALT 17  ALKPHOS 92  BILITOT 0.5  PROT 6.6  ALBUMIN 3.4*   No results for input(s): LIPASE, AMYLASE in the last 168 hours. No results for input(s): AMMONIA in the last 168 hours. Coagulation profile Recent Labs  Lab 01/27/18 0253  INR 0.93    CBC: Recent Labs  Lab 01/27/18 0253 01/28/18 0501  WBC 9.1 6.1  NEUTROABS 6.5  --   HGB 13.5 13.5  HCT 42.6 42.5  MCV 94.0 92.4  PLT 261 329   Cardiac Enzymes: No results for input(s): CKTOTAL, CKMB, CKMBINDEX, TROPONINI in the last 168 hours. BNP: Invalid input(s): POCBNP CBG: Recent Labs  Lab 01/27/18 0245  GLUCAP 100*   D-Dimer No results for input(s): DDIMER in the last 72 hours. Hgb A1c Recent Labs    01/27/18 2200  HGBA1C 5.4   Lipid Profile Recent Labs    01/28/18 0501  CHOL 203*  HDL 59  LDLCALC 128*  TRIG 79  CHOLHDL 3.4   Thyroid function studies Recent Labs    01/27/18 0500  TSH 2.327   Anemia work up Recent Labs    01/28/18  0501  [id number]   Microbiology Recent Results (from the past 240 hour(s))  MRSA PCR Screening     Status: None   Collection Time: 01/27/18  4:42 AM  Result Value Ref Range Status   MRSA by PCR NEGATIVE NEGATIVE Final    Comment:        The GeneXpert MRSA Assay (FDA approved for NASAL specimens only), is one component of a comprehensive MRSA colonization surveillance program. It is not intended to diagnose MRSA infection nor to guide or monitor treatment for MRSA infections. Performed at [facility], [place] [address], [place], [place] [address]       Studies:  Mr Jeri Cos TM Contrast  Result Date: 01/27/2018 CLINICAL DATA:  Vertigo for 2 weeks. Cerebellar hemorrhage on CT. Esophageal tumor. EXAM: MRI HEAD WITHOUT AND WITH CONTRAST  TECHNIQUE: Multiplanar, multiecho pulse sequences of the brain and surrounding structures were obtained without and with intravenous contrast. CONTRAST:  9.5 mL Gadavist COMPARISON:  Head CT 01/26/2018 FINDINGS: Brain: A right parasagittal cerebellar hemorrhage has not significantly changed in size from yesterday CT measuring 2.9 x 2.7 x 2.5 cm. The hemorrhage is heterogeneously T1 hyperintense and T2 hypointense, and there is associated enhancement including nodular foci of internal enhancement. There is mild surrounding vasogenic edema with partial effacement of the fourth ventricle but no obstructive hydrocephalus. No other enhancing intracranial lesions are identified. There is no evidence of acute infarct or extra-axial fluid collection. Cerebral volume is normal for age. Scattered small foci of T2 hyperintensity in the cerebral white matter nonspecific but compatible with minimal chronic small vessel ischemic disease. Vascular: Major intracranial vascular flow voids are preserved. Skull and upper cervical spine: Unremarkable bone marrow signal. Sinuses/Orbits: Bilateral cataract extraction. Opacification of a single left ethmoid air cell. Clear mastoid air cells. Other: None. IMPRESSION: 2.9 cm hemorrhagic cerebellar mass most consistent with solitary brain metastasis. 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 Electronically Signed   By: Logan Bores M.D.   On: 01/27/2018 14:27   Assessment: 72 y.o.[place], [place] woman with  (1) history of left-sided breast cancer s/p lumpectomy 11/12/2017 for a clinical T1b No tumor, pathology no longer available but estrogen receptor negative per patient's recollection, treated with adjuvant chemotherapy and radiation  (2) new right-sided breast cancer, clincially T1b N0, estrogen and progesterone receptor strongly positive; she was scheduled for surgery under Dr Rolm Bookbinder 01/31/2018, but that has been cancelled in light of:  (3) esophageal  cancer:  (a) CT chest 01/26/2018 shows thickened distal esophagus spanning 11.7 cm  (b) EGD 01/25/2018/ Penelope Coop reportedly showing esophageal cancer (path not in Epic yet)  (4) brain metastasisL  (a) head CT w/wo contrast 01/26/2018 shows 2.6 cm parasaggital cerebella hemorrhage  (b) brain MRI w/wo contrast 01/27/2018 interprets earlier findings as a hemorrhagic cerebellar mass, 2.9 cm    Plan:  I explained to the patient that we could be dealing with two problems--a solitary metastasis to the cerebellum from her old left-sided breast cancer (the current right-sided breast cancer is not clinically significant at this point) and esophageal cancer; or, more likely, esophageal cancer metastatic to the brain. Either way we are dealing with stage IV carcinoma and it is important to note neither esophageal or breast cancer are curable when stage IV.  That does not mean there is no treatment. I think she will benefit from aggressive therapy to the solitary brain lesion and I will alert TSU today to start the process going. On Monday I will alert  the brain tumor team including Dr Domingo Cocking and Dr Isidore Moos.  Once the brain issue has been addressed she can discuss therapy for the esophageal primary, which at a minimum will include radiation to alleviate the obstruction, but may also involve chemotherapy.  The right breast surgery has been postponed and may never need to be performed. Once she is stable I will probably start her on anastrozole and follow.  Appreciate the help of Dr Marin Olp and the stroke team. I will follow with you.   Chauncey Cruel, MD 01/28/2018  3:43 PM Medical Oncology and Hematology [facility] [address] [place], [place] [address] Tel. [phone]    Fax. [phone]

## 2018-01-28 NOTE — Progress Notes (Signed)
STROKE TEAM PROGRESS NOTE   SUBJECTIVE (INTERVAL HISTORY) Multiple family members present. Dr Kristin Williams has come by today and discussed with pt and family. Plan for Monday NSG Dr. Rita Williams to see and brain tumor team at [facility] will be altered on Monday also.   OBJECTIVE Vitals:   01/27/18 2343 01/28/18 0356 01/28/18 0829 01/28/18 1200  BP: 111/60 127/78 128/77 129/66  Pulse: 62 65 72 78  Resp: '18 17 16 17  ' Temp: 98 F (36.7 C) 97.8 F (36.6 C) 98.3 F (36.8 C) 98.4 F (36.9 C)  TempSrc: Oral Oral Oral Oral  SpO2: 97% 94% 95% 98%  Weight:      Height:        CBC:  Recent Labs  Lab 01/27/18 0253 01/28/18 0501  WBC 9.1 6.1  NEUTROABS 6.5  --   HGB 13.5 13.5  HCT 42.6 42.5  MCV 94.0 92.4  PLT 261 741    Basic Metabolic Panel:  Recent Labs  Lab 01/27/18 0253 01/28/18 0501  NA 136 136  K 3.8 4.9  CL 103 102  CO2 23 28  GLUCOSE 105* 159*  BUN 7* 8  CREATININE 0.93 1.03*  CALCIUM 9.5 9.9    Lipid Panel:     Component Value Date/Time   CHOL 203 (H) 01/28/2018 0501   TRIG 79 01/28/2018 0501   HDL 59 01/28/2018 0501   CHOLHDL 3.4 01/28/2018 0501   VLDL 16 01/28/2018 0501   LDLCALC 128 (H) 01/28/2018 0501   HgbA1c:  Lab Results  Component Value Date   HGBA1C 5.4 01/27/2018   Urine Drug Screen: No results found for: LABOPIA, COCAINSCRNUR, LABBENZ, AMPHETMU, THCU, LABBARB  Alcohol Level No results found for: [facility]  IMAGING  Ct Head W & Wo Contrast  01/27/2018   IMPRESSION:  1. Acute 10 cc cerebellar hemorrhage with mild mass effect on fourth ventricle, no hydrocephalus.  2. Otherwise negative CT HEAD with and without contrast.    US Abdomen Complete 01/25/2018 IMPRESSION:  1. Portions of pancreas obscured by gas. Visualized portions of pancreas appear normal.  2.  Cyst arising from lower pole left kidney.  3.  Study otherwise unremarkable.    MRI Brain W and Wo Contrast  01/27/2018 IMPRESSION: 2.9 cm hemorrhagic cerebellar mass most consistent with  solitary brain metastasis. 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   PHYSICAL EXAM Blood pressure 129/66, pulse 78, temperature 98.4 F (36.9 C), temperature source Oral, resp. rate 17, height 5' (1.524 m), weight 95.4 kg, SpO2 98 %.  [name] not in distress. Afebrile. Head is nontraumatic. Neck is supple without bruit.    Cardiac exam no murmur or gallop. Lungs are clear to auscultation. Distal pulses are well felt.  Neurological Exam ;  Awake  Alert oriented x 3. Normal speech and language.eye movements full without nystagmus But mild saccadic dysmetria on bilateral horizontal gaze. fundi were not visualized. Vision acuity and fields appear normal. Hearing is normal. Palatal movements are normal. Face symmetric. Tongue midline. Normal strength, tone, reflexes and coordination except subtle right FTN dysmetria. Normal sensation. Gait deferred.    ASSESSMENT/PLAN Kristin Williams is a 72 y.o. female with history of breast Ca, ASPVD, HLD, tobacco use, and COPD presenting with nausea and vertigo . She did not receive IV t-PA due to hemorrhage.  Cerebellar hemorrhage likely due to brain metastasis    Resultant  vertigo and nausea much improved  CT head - Acute 10 cc cerebellar hemorrhage with mild mass  effect on fourth ventricle, no hydrocephalus.   MRI Brain W and Wo Contrast - 2.9 cm hemorrhagic cerebellar mass most consistent with solitary brain metastasis  2D Echo - EF 55-60%  LDL - 128  HgbA1C - 5.4  VTE prophylaxis - SCDs  Diet - Heart healthy with thin liquids.  No antithrombotic prior to admission, now on No antithrombotic  Ongoing aggressive stroke risk factor management  Therapy recommendations:  Outpt PT recommended  Disposition:  Pending  Esophageal cancer  Showed on CT chest  Likely the source for brain mets  Dr. Jana Williams on board  Will follow up with him as outpt  Breast cancer   Has been following with  Dr. Jana Williams  Less likely the source for brain met this time  Will follow up with Dr. Jana Williams as outpt  Hypertension  Stable  Long-term BP goal normotensive  Hyperlipidemia   Not on meds PTA  LDL 128  Given brain mets and hemorrhage, no statin recommended at this time  Tobacco abuse  Current smoker  Smoking cessation counseling provided  Pt is willing to quit  Other Stroke Risk Factors  Advanced age  Obesity, Body mass index is 41.08 kg/m., recommend weight loss, diet and exercise as appropriate    Other Active Problems  Leukocytosis WBC 11.8  Elevated Cre 1.03  Hospital day # 1   Kristin Hawking, MD PhD Stroke Neurology 01/29/2018 7:10 AM    To contact Stroke Continuity provider, please refer to [email or web address] After hours, contact General Neurology

## 2018-01-28 NOTE — Progress Notes (Signed)
OT Cancellation Note  Patient Details Name: Kristin Williams MRN: [id number] DOB: April 12, 1945   Cancelled Treatment:    Reason Eval/Treat Not Completed: Patient declined, no reason specified. Pt reports nausea and "I can't do it right now".  Will re-attempt OT evaluation as able.  Will continue to follow.   [place] Stare, OT Acute Rehabilitation Services Pager [phone] Office [phone]   Delight Stare 01/28/2018, 3:51 PM

## 2018-01-29 ENCOUNTER — Other Ambulatory Visit: Payer: Self-pay

## 2018-01-29 DIAGNOSIS — E785 Hyperlipidemia, unspecified: Secondary | ICD-10-CM

## 2018-01-29 DIAGNOSIS — F17200 Nicotine dependence, unspecified, uncomplicated: Secondary | ICD-10-CM

## 2018-01-29 LAB — CBC
HCT: 38.9 % (ref 36.0–46.0)
Hemoglobin: 12.7 g/dL (ref 12.0–15.0)
MCH: 29.7 pg (ref 26.0–34.0)
MCHC: 32.6 g/dL (ref 30.0–36.0)
MCV: 91.1 fL (ref 80.0–100.0)
Platelets: 308 10*3/uL (ref 150–400)
RBC: 4.27 MIL/uL (ref 3.87–5.11)
RDW: 13 % (ref 11.5–15.5)
WBC: 11.8 10*3/uL — ABNORMAL HIGH (ref 4.0–10.5)
nRBC: 0 % (ref 0.0–0.2)

## 2018-01-29 LAB — BASIC METABOLIC PANEL
Anion gap: 10 (ref 5–15)
BUN: 14 mg/dL (ref 8–23)
CO2: 23 mmol/L (ref 22–32)
CREATININE: 1.03 mg/dL — AB (ref 0.44–1.00)
Calcium: 9.7 mg/dL (ref 8.9–10.3)
Chloride: 104 mmol/L (ref 98–111)
GFR calc Af Amer: >60 mL/min (ref >60)
GFR calc non Af Amer: 54 mL/min — ABNORMAL LOW (ref >60)
GLUCOSE: 138 mg/dL — AB (ref 70–99)
Potassium: 4.1 mmol/L (ref 3.5–5.1)
SODIUM: 137 mmol/L (ref 135–145)

## 2018-01-29 MED ORDER — SODIUM CHLORIDE 0.9 % IV SOLN
INTRAVENOUS | Status: DC
  Administered 2018-01-29 (×2): via INTRAVENOUS

## 2018-01-29 MED ORDER — ENSURE ENLIVE PO LIQD: 237.0000 mL | Freq: Two times a day (BID) | ORAL | Status: DC

## 2018-01-29 NOTE — Plan of Care (Signed)
  Problem: Education: Goal: Knowledge of disease or condition will improve Outcome: Progressing Goal: Knowledge of secondary prevention will improve Outcome: Progressing Goal: Knowledge of patient specific risk factors addressed and post discharge goals established will improve Outcome: Progressing Goal: Individualized Educational Video(s) Outcome: Progressing   Problem: Coping: Goal: Will verbalize positive feelings about self Outcome: Progressing Goal: Will identify appropriate support needs Outcome: Progressing   Problem: Health Behavior/Discharge Planning: Goal: Ability to manage health-related needs will improve Outcome: Progressing   Problem: Self-Care: Goal: Ability to participate in self-care as condition permits will improve Outcome: Progressing Goal: Verbalization of feelings and concerns over difficulty with self-care will improve Outcome: Progressing Goal: Ability to communicate needs accurately will improve Outcome: Progressing   Problem: Nutrition: Goal: Risk of aspiration will decrease Outcome: Progressing   Problem: Intracerebral Hemorrhage Tissue Perfusion: Goal: Complications of Intracerebral Hemorrhage will be minimized Outcome: Progressing   Problem: Education: Goal: Knowledge of General Education information will improve Description Including pain rating scale, medication(s)/side effects and non-pharmacologic comfort measures Outcome: Progressing   Problem: Health Behavior/Discharge Planning: Goal: Ability to manage health-related needs will improve Outcome: Progressing   Problem: Clinical Measurements: Goal: Ability to maintain clinical measurements within normal limits will improve Outcome: Progressing Goal: Will remain free from infection Outcome: Progressing Goal: Diagnostic test results will improve Outcome: Progressing Goal: Respiratory complications will improve Outcome: Progressing Goal: Cardiovascular complication will be  avoided Outcome: Progressing   Problem: Activity: Goal: Risk for activity intolerance will decrease Outcome: Progressing   Problem: Coping: Goal: Level of anxiety will decrease Outcome: Progressing   Problem: Elimination: Goal: Will not experience complications related to bowel motility Outcome: Progressing Goal: Will not experience complications related to urinary retention Outcome: Progressing   Problem: Pain Managment: Goal: General experience of comfort will improve Outcome: Progressing   Problem: Safety: Goal: Ability to remain free from injury will improve Outcome: Progressing   Problem: Skin Integrity: Goal: Risk for impaired skin integrity will decrease Outcome: Progressing   Problem: Nutrition: Goal: Adequate nutrition will be maintained Outcome: Not Progressing Note:  Poor appetite with nausea.

## 2018-01-29 NOTE — Progress Notes (Signed)
Appreciate learning  Kristin Williams   DOB:12-14-1945   VV#:[id number]   PYY#:[id number]  Subjective:  Met with patient, husband Joneen Boers, daughter Maudie Mercury, son Lanny Hurst, patient's daughter in law and a neighbor; reviewed recent history of 30 lb weight loss, dysphagia, and lack of response to PPIs; as well as more recent history of "vertigo," nausea and vomiting; reviewed results of recent scans, and results of recent mammography showing a small estrogen receptor positive breast cancer. --  Today patient says she feels better (on steroids); nauseated but not vomiting; denies hemoptysis, cough, pleurisy, or change in bowel or bladder habits; had a very good PS until very recently when "vertigo" affected her balance.   Objective: middle aged White woman examined at bedside Vitals:   01/28/18 2300 01/29/18 0309  BP: (!) 131/55 (!) 132/52  Pulse: 62 62  Resp: 18 18  Temp: 98.3 F (36.8 C) 97.7 F (36.5 C)  SpO2: 94% 95%    Body mass index is 41.08 kg/m. No intake or output data in the 24 hours ending 01/29/18 0758   Sclerae unicteric  No cervical or supraclavicular adenopathy  Lungs no rales or wheezes  Heart regular rate and rhythm  Abdomen soft, +BS  Neuro nonfocal  Breast exam: deferred  CBG (last 3)  Recent Labs    01/27/18 0245  GLUCAP 100*     Labs:  Lab Results  Component Value Date   WBC 11.8 (H) 01/29/2018   HGB 12.7 01/29/2018   HCT 38.9 01/29/2018   MCV 91.1 01/29/2018   PLT 308 01/29/2018   NEUTROABS 6.5 01/27/2018    '@LASTCHEMISTRY' @  Urine Studies No results for input(s): UHGB, CRYS in the last 72 hours.  Invalid input(s): UACOL, UAPR, USPG, UPH, UTP, UGL, UKET, UBIL, UNIT, UROB, ULEU, UEPI, UWBC, URBC, UBAC, CAST, [place], [place]  Basic Metabolic Panel: Recent Labs  Lab 01/27/18 0253 01/28/18 0501 01/29/18 0457  NA 136 136 137  K 3.8 4.9 4.1  CL 103 102 104  CO2 '23 28 23  ' GLUCOSE 105* 159* 138*  BUN 7* 8 14  CREATININE 0.93 1.03* 1.03*  CALCIUM 9.5 9.9  9.7   GFR Estimated Creatinine Clearance: 52.6 mL/min (A) (by C-G formula based on SCr of 1.03 mg/dL (H)). Liver Function Tests: Recent Labs  Lab 01/27/18 0253  AST 16  ALT 17  ALKPHOS 92  BILITOT 0.5  PROT 6.6  ALBUMIN 3.4*   No results for input(s): LIPASE, AMYLASE in the last 168 hours. No results for input(s): AMMONIA in the last 168 hours. Coagulation profile Recent Labs  Lab 01/27/18 0253  INR 0.93    CBC: Recent Labs  Lab 01/27/18 0253 01/28/18 0501 01/29/18 0457  WBC 9.1 6.1 11.8*  NEUTROABS 6.5  --   --   HGB 13.5 13.5 12.7  HCT 42.6 42.5 38.9  MCV 94.0 92.4 91.1  PLT 261 329 308   Cardiac Enzymes: No results for input(s): CKTOTAL, CKMB, CKMBINDEX, TROPONINI in the last 168 hours. BNP: Invalid input(s): POCBNP CBG: Recent Labs  Lab 01/27/18 0245  GLUCAP 100*   D-Dimer No results for input(s): DDIMER in the last 72 hours. Hgb A1c Recent Labs    01/27/18 2200  HGBA1C 5.4   Lipid Profile Recent Labs    01/28/18 0501  CHOL 203*  HDL 59  LDLCALC 128*  TRIG 79  CHOLHDL 3.4   Thyroid function studies Recent Labs    01/27/18 0500  TSH 2.327   Anemia work up [employer]  01/28/18 0501  VITAMINB12 642   Microbiology Recent Results (from the past 240 hour(s))  MRSA PCR Screening     Status: None   Collection Time: 01/27/18  4:42 AM  Result Value Ref Range Status   MRSA by PCR NEGATIVE NEGATIVE Final    Comment:        The GeneXpert MRSA Assay (FDA approved for NASAL specimens only), is one component of a comprehensive MRSA colonization surveillance program. It is not intended to diagnose MRSA infection nor to guide or monitor treatment for MRSA infections. Performed at [facility], [place] [address] [place], [place] [address]       Studies:  Mr Jeri Cos LY Contrast  Result Date: 01/27/2018 CLINICAL DATA:  Vertigo for 2 weeks. Cerebellar hemorrhage on CT. Esophageal tumor. EXAM: MRI HEAD WITHOUT AND WITH  CONTRAST TECHNIQUE: Multiplanar, multiecho pulse sequences of the brain and surrounding structures were obtained without and with intravenous contrast. CONTRAST:  9.5 mL Gadavist COMPARISON:  Head CT 01/26/2018 FINDINGS: Brain: A right parasagittal cerebellar hemorrhage has not significantly changed in size from yesterday CT measuring 2.9 x 2.7 x 2.5 cm. The hemorrhage is heterogeneously T1 hyperintense and T2 hypointense, and there is associated enhancement including nodular foci of internal enhancement. There is mild surrounding vasogenic edema with partial effacement of the fourth ventricle but no obstructive hydrocephalus. No other enhancing intracranial lesions are identified. There is no evidence of acute infarct or extra-axial fluid collection. Cerebral volume is normal for age. Scattered small foci of T2 hyperintensity in the cerebral white matter nonspecific but compatible with minimal chronic small vessel ischemic disease. Vascular: Major intracranial vascular flow voids are preserved. Skull and upper cervical spine: Unremarkable bone marrow signal. Sinuses/Orbits: Bilateral cataract extraction. Opacification of a single left ethmoid air cell. Clear mastoid air cells. Other: None. IMPRESSION: 2.9 cm hemorrhagic cerebellar mass most consistent with solitary brain metastasis. 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 Electronically Signed   By: Logan Bores M.D.   On: 01/27/2018 14:27   Assessment: 72 y.o.[place], [place] woman with  (1) history of left-sided breast cancer s/p lumpectomy 11/12/2017 for a clinical T1b No tumor, pathology no longer available but estrogen receptor negative per patient's recollection, treated with adjuvant chemotherapy and radiation  (2) new right-sided breast cancer, clincially T1b N0, estrogen and progesterone receptor strongly positive; she was scheduled for surgery under Dr Rolm Bookbinder 01/31/2018, but that has been cancelled in light of:  (3)  esophageal cancer:  (a) CT chest 01/26/2018 shows thickened distal esophagus spanning 11.7 cm  (b) EGD 01/25/2018/ Penelope Coop reportedly showing esophageal cancer (path not in Epic yet)  (4) brain metastasisL  (a) head CT w/wo contrast 01/26/2018 shows 2.6 cm parasaggital cerebella hemorrhage  (b) brain MRI w/wo contrast 01/27/2018 interprets earlier findings as a hemorrhagic cerebellar mass, 2.9 cm    Plan:  I explained to the patient that we could be dealing with two problems--a solitary metastasis to the cerebellum from her old left-sided breast cancer (the current right-sided breast cancer is not clinically significant at this point) and esophageal cancer; or, more likely, esophageal cancer metastatic to the brain. Either way we are dealing with stage IV carcinoma and it is important to note neither esophageal or breast cancer are curable when stage IV.  That does not mean there is no treatment. I think she will benefit from aggressive therapy to the solitary brain lesion and I will alert TSU today to start the process going. On Monday I will  alert the brain tumor team including Dr Domingo Cocking and Dr Isidore Moos.  Once the brain issue has been addressed she can discuss therapy for the esophageal primary, which at a minimum will include radiation to alleviate the obstruction, but may also involve chemotherapy.  The right breast surgery has been postponed and may never need to be performed. Once she is stable I will probably start her on anastrozole and follow.  Appreciate the help of Dr Marin Olp and the stroke team. I will follow with you.   Chauncey Cruel, MD 01/29/2018  7:58 AM Medical Oncology and Hematology [facility] [address] [place], [place] [address] Tel. [phone]    Fax. [phone]   ADDENDUM 01/29/2018: Have alerted neurosurgery to patient's situation, anticipate consult AM 11/18  Had more nausea and retching yesterday PM; added lorazepam, with minimal  help  Able to drink as far as the esophageal stricture is concerned, but gets nauseated easily and is drinking by her account less than 2 pints liquid daily. Will start IV while in hospital  Final pathology on EGD should be out tomorrow  Will follow with you

## 2018-01-29 NOTE — Evaluation (Signed)
Occupational Therapy Evaluation Patient Details Name: Kristin Williams MRN: [id number] DOB: 03-18-1945 Today's Date: 01/29/2018    History of Present Illness Pt is a 72 y/o female with PMH significant for Cervical, vaginal and breast CA's, COPD, PVD admitted with 2 or more week h/o vertigo that fluctuated in intensity, but worsened afternoon of admission.  CT showed cerebellar hemmorhage.  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   Clinical Impression   PTA patient independent, limited IADLs.  Currently admitted for above and limited by impaired balance, decreased activity tolerance, slight R UE dysmetria, and dizziness and nausea at times. Patient currently requires min guard for mobility and transfers for safety, min guard for LB ADLS and setup assist for UB ADLs.  Patient will benefit from continued OT services while admitted in order to maximize return to PLOF, but anticipate no further OT needs after discharge.  Will continue to follow.     Follow Up Recommendations  No OT follow up;Supervision/Assistance - 24 hour    Equipment Recommendations  None recommended by OT    Recommendations for Other Services       Precautions / Restrictions Precautions Precautions: Fall Restrictions Weight Bearing Restrictions: No      Mobility Bed Mobility Overal bed mobility: Modified Independent                Transfers Overall transfer level: Needs assistance Equipment used: None Transfers: Sit to/from Stand Sit to Stand: Min guard         General transfer comment: min guard for safety    Balance Overall balance assessment: Needs assistance Sitting-balance support: Feet supported Sitting balance-Leahy Scale: Good     Standing balance support: No upper extremity supported Standing balance-Leahy Scale: Fair                             ADL either performed or assessed with clinical judgement   ADL  Overall ADL's : Needs assistance/impaired     Grooming: Min guard;Standing   Upper Body Bathing: Supervision/ safety;Sitting   Lower Body Bathing: Min guard;Sit to/from stand   Upper Body Dressing : Supervision/safety;Set up;Sitting   Lower Body Dressing: Min guard;Sit to/from stand Lower Body Dressing Details (indicate cue type and reason): able to don socks, min guard in standing  Toilet Transfer: Min guard;Ambulation Toilet Transfer Details (indicate cue type and reason): simulated in room Toileting- Clothing Manipulation and Hygiene: Min guard;Sit to/from stand   Tub/ Shower Transfer: Tub transfer;Min guard;Ambulation;3 in 1 Tub/Shower Transfer Details (indicate cue type and reason): min guard for safety with support of wall with transfer Functional mobility during ADLs: Min guard General ADL Comments: min guard, increased time required and cueing for visual focal point to assist with dizziness      Vision Baseline Vision/History: Wears glasses Wears Glasses: Reading only Patient Visual Report: No change from baseline Vision Assessment?: No apparent visual deficits Additional Comments: WFL tracking     Perception     Praxis      Pertinent Vitals/Pain Pain Assessment: No/denies pain     Hand Dominance Right   Extremity/Trunk Assessment Upper Extremity Assessment Upper Extremity Assessment: RUE deficits/detail RUE Deficits / Details: functional, slight dysmetria RUE Coordination: decreased gross motor   Lower Extremity Assessment Lower Extremity Assessment: Defer to PT evaluation       Communication Communication Communication: No difficulties   Cognition Arousal/Alertness: Awake/alert Behavior During Therapy: [facility]  for tasks assessed/performed Overall Cognitive Status: Within Functional Limits for tasks assessed                                     General Comments       Exercises     Shoulder Instructions      Home Living  Family/patient expects to be discharged to:: Private residence Living Arrangements: Spouse/significant other Available Help at Discharge: Family;Available 24 hours/day Type of Home: House       Home Layout: Two level;Other (Comment)(bed upstairs, bath downstairs) Alternate Level Stairs-Number of Steps: flight   Bathroom Shower/Tub: Teacher, early years/pre: Handicapped height     Home Equipment: Bedside commode;Grab bars - tub/shower          Prior Functioning/Environment Level of Independence: Independent        Comments: limited IADLs, but independent ADLs        OT Problem List: Decreased activity tolerance;Impaired balance (sitting and/or standing);Decreased coordination;Decreased knowledge of use of DME or AE      OT Treatment/Interventions: Self-care/ADL training;Therapeutic exercise;Neuromuscular education;Energy conservation;DME and/or AE instruction;Therapeutic activities;Balance training;Patient/family education    OT Goals(Current goals can be found in the care plan section) Acute Rehab OT Goals Patient Stated Goal: to feel better OT Goal Formulation: With patient Time For Goal Achievement: 02/12/18 Potential to Achieve Goals: Good  OT Frequency: Min 2X/week   Barriers to D/C:            Co-evaluation              AM-PAC PT "6 Clicks" Daily Activity     Outcome Measure Help from another person eating meals?: None Help from another person taking care of personal grooming?: A Little Help from another person toileting, which includes using toliet, bedpan, or urinal?: A Little Help from another person bathing (including washing, rinsing, drying)?: A Little Help from another person to put on and taking off regular upper body clothing?: None Help from another person to put on and taking off regular lower body clothing?: A Little 6 Click Score: 20   End of Session Equipment Utilized During Treatment: Gait belt  Activity Tolerance: Patient  tolerated treatment well Patient left: with call bell/phone within reach;with family/visitor present(seated EOB)  OT Visit Diagnosis: Unsteadiness on feet (R26.81);Dizziness and giddiness (R42)                Time: 8099-8338 OT Time Calculation (min): 18 min Charges:  OT General Charges $OT Visit: 1 Visit OT Evaluation $OT Eval Moderate Complexity: [place], OT Acute Rehabilitation Services Pager [phone] Office [phone]   Delight Stare 01/29/2018, 10:16 AM

## 2018-01-29 NOTE — Progress Notes (Signed)
Pt IV infiltrated with admin of IV Reglan, pharmacy contacted, stated, that Reglan is not corrosive. Arlis Porta

## 2018-01-29 NOTE — Progress Notes (Signed)
PT.  vomited x 1 after Reglan given. MD notified and new orders given and carried out.   The rest of pt;s night was uneventful  Vs stable.

## 2018-01-29 NOTE — Progress Notes (Signed)
STROKE TEAM PROGRESS NOTE   SUBJECTIVE (INTERVAL HISTORY) Multiple family members present.  Patient not in distress, no acute event overnight.  She would like to have a shower in room.  Pending Dr. Sherwood Gambler to see tomorrow.  OBJECTIVE Vitals:   01/28/18 1600 01/28/18 2000 01/28/18 2300 01/29/18 0309  BP: (!) 128/58 (!) 145/65 (!) 131/55 (!) 132/52  Pulse: 74  62 62  Resp: '16 18 18 18  ' Temp: 98.4 F (36.9 C) (!) 97.5 F (36.4 C) 98.3 F (36.8 C) 97.7 F (36.5 C)  TempSrc: Oral Oral Oral Oral  SpO2: 98% 96% 94% 95%  Weight:      Height:        CBC:  Recent Labs  Lab 01/27/18 0253 01/28/18 0501 01/29/18 0457  WBC 9.1 6.1 11.8*  NEUTROABS 6.5  --   --   HGB 13.5 13.5 12.7  HCT 42.6 42.5 38.9  MCV 94.0 92.4 91.1  PLT 261 329 222    Basic Metabolic Panel:  Recent Labs  Lab 01/28/18 0501 01/29/18 0457  NA 136 137  K 4.9 4.1  CL 102 104  CO2 28 23  GLUCOSE 159* 138*  BUN 8 14  CREATININE 1.03* 1.03*  CALCIUM 9.9 9.7    Lipid Panel:     Component Value Date/Time   CHOL 203 (H) 01/28/2018 0501   TRIG 79 01/28/2018 0501   HDL 59 01/28/2018 0501   CHOLHDL 3.4 01/28/2018 0501   VLDL 16 01/28/2018 0501   LDLCALC 128 (H) 01/28/2018 0501   HgbA1c:  Lab Results  Component Value Date   HGBA1C 5.4 01/27/2018   Urine Drug Screen: No results found for: LABOPIA, COCAINSCRNUR, LABBENZ, AMPHETMU, THCU, LABBARB  Alcohol Level No results found for: [facility]  IMAGING  Ct Head W & Wo Contrast  01/27/2018   IMPRESSION:  1. Acute 10 cc cerebellar hemorrhage with mild mass effect on fourth ventricle, no hydrocephalus.  2. Otherwise negative CT HEAD with and without contrast.    US Abdomen Complete 01/25/2018 IMPRESSION:  1. Portions of pancreas obscured by gas. Visualized portions of pancreas appear normal.  2.  Cyst arising from lower pole left kidney.  3.  Study otherwise unremarkable.    MRI Brain W and Wo Contrast  01/27/2018 IMPRESSION: 2.9 cm hemorrhagic  cerebellar mass most consistent with solitary brain metastasis. 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   PHYSICAL EXAM Blood pressure (!) 132/52, pulse 62, temperature 97.7 F (36.5 C), temperature source Oral, resp. rate 18, height 5' (1.524 m), weight 95.4 kg, SpO2 95 %.  [name] not in distress. Afebrile. Head is nontraumatic. Neck is supple without bruit.    Cardiac exam no murmur or gallop. Lungs are clear to auscultation. Distal pulses are well felt.  Neurological Exam ;  Awake  Alert oriented x 3. Normal speech and language.eye movements full without nystagmus But mild saccadic dysmetria on bilateral horizontal gaze. fundi were not visualized. Vision acuity and fields appear normal. Hearing is normal. Palatal movements are normal. Face symmetric. Tongue midline. Normal strength, tone, reflexes and coordination except subtle right FTN dysmetria. Normal sensation. Gait deferred.    ASSESSMENT/PLAN Ms. KIMM SIDER is a 72 y.o. female with history of breast Ca, ASPVD, HLD, tobacco use, and COPD presenting with nausea and vertigo . She did not receive IV t-PA due to hemorrhage.  Cerebellar hemorrhage likely due to brain metastasis    Resultant  vertigo and nausea much improved  CT head -  Acute 10 cc cerebellar hemorrhage with mild mass effect on fourth ventricle, no hydrocephalus.   MRI Brain W and Wo Contrast - 2.9 cm hemorrhagic cerebellar mass most consistent with solitary brain metastasis  2D Echo - EF 55-60%  LDL - 128  HgbA1C - 5.4  VTE prophylaxis - SCDs  Diet - Heart healthy with thin liquids.  No antithrombotic prior to admission, now on No antithrombotic  Ongoing aggressive stroke risk factor management  Therapy recommendations:  Outpt PT recommended  Disposition:  Pending  Esophageal cancer  Showed on CT chest  Likely the source for brain mets  Dr. Jana Hakim on board  Will follow up with him as  outpt  Breast cancer   Has been following with Dr. Jana Hakim  Less likely the source for brain met this time  Will follow up with Dr. Jana Hakim as outpt  Hypertension  Stable  Long-term BP goal normotensive  Hyperlipidemia   Not on meds PTA  LDL 128  Given brain mets and hemorrhage, no statin recommended at this time  Tobacco abuse  Current smoker  Smoking cessation counseling provided  Pt is willing to quit  Other Stroke Risk Factors  Advanced age  Obesity, Body mass index is 41.08 kg/m., recommend weight loss, diet and exercise as appropriate    Other Active Problems  Leukocytosis WBC 6.1 ->11.8  Elevated Cre 1.03->1.03  Hospital day # 2   Rosalin Hawking, MD PhD Stroke Neurology 01/29/2018 6:45 PM     To contact Stroke Continuity provider, please refer to [email or web address] After hours, contact General Neurology

## 2018-01-30 ENCOUNTER — Encounter: Payer: Self-pay | Admitting: Radiation Therapy

## 2018-01-30 ENCOUNTER — Telehealth: Payer: Self-pay | Admitting: Radiation Therapy

## 2018-01-30 ENCOUNTER — Inpatient Hospital Stay: Admission: RE | Admit: 2018-01-30 | Payer: Medicare Other | Source: Ambulatory Visit

## 2018-01-30 DIAGNOSIS — I10 Essential (primary) hypertension: Secondary | ICD-10-CM | POA: Diagnosis present

## 2018-01-30 DIAGNOSIS — F17200 Nicotine dependence, unspecified, uncomplicated: Secondary | ICD-10-CM | POA: Diagnosis present

## 2018-01-30 DIAGNOSIS — C159 Malignant neoplasm of esophagus, unspecified: Secondary | ICD-10-CM | POA: Diagnosis present

## 2018-01-30 DIAGNOSIS — E785 Hyperlipidemia, unspecified: Secondary | ICD-10-CM | POA: Diagnosis present

## 2018-01-30 DIAGNOSIS — C7931 Secondary malignant neoplasm of brain: Secondary | ICD-10-CM | POA: Diagnosis present

## 2018-01-30 DIAGNOSIS — Z170 Estrogen receptor positive status [ER+]: Secondary | ICD-10-CM

## 2018-01-30 DIAGNOSIS — C50111 Malignant neoplasm of central portion of right female breast: Secondary | ICD-10-CM

## 2018-01-30 LAB — BASIC METABOLIC PANEL
ANION GAP: 5 (ref 5–15)
BUN: 18 mg/dL (ref 8–23)
CHLORIDE: 107 mmol/L (ref 98–111)
CO2: 24 mmol/L (ref 22–32)
Calcium: 8.9 mg/dL (ref 8.9–10.3)
Creatinine, Ser: 0.97 mg/dL (ref 0.44–1.00)
GFR calc Af Amer: >60 mL/min (ref >60)
GFR, EST NON AFRICAN AMERICAN: 58 mL/min — AB (ref >60)
Glucose, Bld: 135 mg/dL — ABNORMAL HIGH (ref 70–99)
POTASSIUM: 4.4 mmol/L (ref 3.5–5.1)
Sodium: 136 mmol/L (ref 135–145)

## 2018-01-30 LAB — CBC
HCT: 38.4 % (ref 36.0–46.0)
HEMOGLOBIN: 12.3 g/dL (ref 12.0–15.0)
MCH: 29.5 pg (ref 26.0–34.0)
MCHC: 32 g/dL (ref 30.0–36.0)
MCV: 92.1 fL (ref 80.0–100.0)
NRBC: 0 % (ref 0.0–0.2)
PLATELETS: 263 10*3/uL (ref 150–400)
RBC: 4.17 MIL/uL (ref 3.87–5.11)
RDW: 13.1 % (ref 11.5–15.5)
WBC: 9.8 10*3/uL (ref 4.0–10.5)

## 2018-01-30 MED ORDER — BOOST / RESOURCE BREEZE PO LIQD CUSTOM: 1.0000 | Freq: Three times a day (TID) | ORAL | Status: DC

## 2018-01-30 NOTE — Progress Notes (Signed)
Spoke with Dr. Erlinda Hong regarding MRI request from [facility]. Dr. Bryan Lemma that MRI was performed 3 days ago and that a repeat could be done outpatient once pt sees neuro oncologist and after blood is absorbed from the brain.

## 2018-01-30 NOTE — Progress Notes (Signed)
Chaplain responded to spiritual care consult.  Patient requested info on Advanced Directives. Chaplain explained process and left form.  Will check and see if pt is ready to execute it tomorrow. Tamsen Snider [phone] pager

## 2018-01-30 NOTE — Discharge Summary (Addendum)
Stroke Discharge Summary  Patient ID: Kristin Williams   MRN: [id number]      DOB: 02-Oct-1945  Date of Admission: 01/27/2018 Date of Discharge: 01/31/2018  Attending Physician:  Rosalin Hawking, MD, Stroke MD Consultant(s):   Marin Olp, Rudell Cobb, MD hematology/oncology, Sherwood Gambler, MD neurosurgery Patient's PCP:  Lujean Amel, MD  DISCHARGE DIAGNOSIS:  Principal Problem:    Metastatic brain hemorrhage Active Problems:   Vasogenic cerebral edema ([place])   Metastasis to brain [facility])   Esophageal cancer, stage IV ([place])   Breast cancer ([place])   Hypertension, essential   Hyperlipidemia   Tobacco use disorder   Past Medical History:  Diagnosis Date  . Arthritis   . Cancer (HCC)    Cervical  . Cancer ([place])    Breast  . Cancer ([place])    Vaginal  . Complication of anesthesia   . COPD (chronic obstructive pulmonary disease) ([place])   . Headache(784.0)   . Hyperlipidemia   . Leg pain   . Peripheral vascular disease ([place])   . Personal history of chemotherapy   . Personal history of radiation therapy   . PONV (postoperative nausea and vomiting)    Past Surgical History:  Procedure Laterality Date  . ABDOMINAL HYSTERECTOMY  1975  . BREAST LUMPECTOMY  2005  . GANGLION CYST EXCISION  1977  . ILIAC ARTERY STENT  04/20/2004   CSD right iliac occlusive disease    Allergies as of 01/31/2018      Reactions   Flonase [fluticasone Propionate] Other (See Comments)   Nose bleeds      Medication List    TAKE these medications   dexamethasone 4 MG tablet Commonly known as:  DECADRON Take 1 tablet (4 mg total) by mouth every 12 (twelve) hours.   LORazepam 1 MG tablet Commonly known as:  ATIVAN Take 1 tablet (1 mg total) by mouth 3 (three) times daily as needed for anxiety.   metoCLOPramide 10 MG tablet Commonly known as:  REGLAN Take 1 tablet (10 mg total) by mouth every 6 (six) hours as needed for nausea.   omeprazole 20 MG capsule Commonly known as:  PRILOSEC Take 40 mg by mouth  daily.   ondansetron 8 MG tablet Commonly known as:  ZOFRAN Take 8 mg by mouth every 6 (six) hours as needed for nausea or vomiting.       LABORATORY STUDIES CBC    Component Value Date/Time   WBC 9.8 01/30/2018 0618   RBC 4.17 01/30/2018 0618   HGB 12.3 01/30/2018 0618   HGB 13.1 01/06/2009 1325   HCT 38.4 01/30/2018 0618   HCT 39.2 01/06/2009 1325   PLT 263 01/30/2018 0618   PLT 255 01/06/2009 1325   MCV 92.1 01/30/2018 0618   MCV 95.7 01/06/2009 1325   MCH 29.5 01/30/2018 0618   MCHC 32.0 01/30/2018 0618   RDW 13.1 01/30/2018 0618   RDW 15.1 (H) 01/06/2009 1325   LYMPHSABS 1.5 01/27/2018 0253   LYMPHSABS 2.0 01/06/2009 1325   MONOABS 0.7 01/27/2018 0253   MONOABS 0.6 01/06/2009 1325   EOSABS 0.3 01/27/2018 0253   EOSABS 0.2 01/06/2009 1325   BASOSABS 0.1 01/27/2018 0253   BASOSABS 0.0 01/06/2009 1325   CMP    Component Value Date/Time   NA 136 01/30/2018 0618   K 4.4 01/30/2018 0618   CL 107 01/30/2018 0618   CO2 24 01/30/2018 0618   GLUCOSE 135 (H) 01/30/2018 0618   BUN 18 01/30/2018 0618  CREATININE 0.97 01/30/2018 0618   CALCIUM 8.9 01/30/2018 0618   PROT 6.6 Feb 23, 2018 0253   ALBUMIN 3.4 (L) 02-23-2018 0253   AST 16 02/23/2018 0253   ALT 17 2018/02/23 0253   ALKPHOS 92 02/23/2018 0253   BILITOT 0.5 02/23/2018 0253   GFRNONAA 58 (L) 01/30/2018 0618   GFRAA >60 01/30/2018 0618   COAGS Lab Results  Component Value Date   INR 0.93 2018/02/23   Lipid Panel    Component Value Date/Time   CHOL 203 (H) 01/28/2018 0501   TRIG 79 01/28/2018 0501   HDL 59 01/28/2018 0501   CHOLHDL 3.4 01/28/2018 0501   VLDL 16 01/28/2018 0501   LDLCALC 128 (H) 01/28/2018 0501   HgbA1C  Lab Results  Component Value Date   HGBA1C 5.4 02-23-18   Urinalysis No results found for: COLORURINE, APPEARANCEUR, LABSPEC, PHURINE, GLUCOSEU, HGBUR, BILIRUBINUR, KETONESUR, PROTEINUR, UROBILINOGEN, NITRITE, LEUKOCYTESUR Urine Drug Screen No results found for: LABOPIA,  COCAINSCRNUR, LABBENZ, AMPHETMU, THCU, LABBARB  Alcohol Level No results found for: [facility]   SIGNIFICANT DIAGNOSTIC STUDIES Ct Head W & Wo Contrast  2018-02-23   1. Acute 10 cc cerebellar hemorrhage with mild mass effect on fourth ventricle, no hydrocephalus.  2. Otherwise negative CT HEAD with and without contrast.   MRI Brain W and Wo Contrast  February 23, 2018 2.9 cm hemorrhagic cerebellar mass most consistent with solitary brain metastasis. 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     HISTORY OF PRESENT ILLNESS Kristin Williams is a 72 y.o. female with at least 2 weeks of vertigo as well as episode of worsening the afternoon of admission.  She has been nauseated and having vertigo and therefore a CT scan was ordered by her PCP which was done as an outpatient today and revealed a fairly large cerebellar hemorrhage. she was asked to present to the ER.   In the ER she was started on clevidipine for blood pressure control is currently requiring fairly high-dose been to keep her blood pressure less than 140. ICH Score: 1. Modified Rankin Scale: 0-Completely asymptomatic and back to baseline post- stroke. She was admitted to the ICU for further evaluation and treatment.     HOSPITAL COURSE Ms. Kristin Williams is a 72 y.o. female with history of breast Ca, ASPVD, HLD, tobacco use, and COPD presenting with nausea and vertigo . She did not receive IV t-PA due to hemorrhage.  Cerebellar hemorrhage likely due to brain metastasis   Resultant  vertigo and nausea much improved  CT head - Acute 10 cc cerebellar hemorrhage with mild mass effect on fourth ventricle, no hydrocephalus.  MRI Brain W and Wo Contrast - 2.9 cm hemorrhagic cerebellar mass most consistent with solitary brain metastasis  2D Echo - EF 55-60%  LDL - 128  HgbA1C - 5.4  No antithrombotic prior to admission, now on No antithrombotic given hemorrhage  Therapy recommendations:  Outpt PT   Disposition:  Return  home  Dr. Sherwood Gambler saw patient; ordered testing completed before discharge.   Patient with f/u appointments today with oncology/radiology.  Mild nausea reglan PRN; continue dexamethasone PO   Esophageal cancer  Showed on CT chest  Most Likely the source for brain mets  Dr. Jana Hakim on board  Will follow up with him as outpt  Breast cancer   Has been following with Dr. Jana Hakim  Less likely the source for brain met this time  Will follow up with Dr. Jana Hakim as outpt  Hypertension  Stable  Long-term BP goal  normotensive  Hyperlipidemia   Not on meds PTA  LDL 128  Given brain mets and hemorrhage, no statin recommended at this time  Tobacco abuse  Current smoker  Smoking cessation counseling provided  Pt is willing to quit  Other Stroke Risk Factors  Advanced age  Morbid Obesity, Body mass index is 41.08 kg/m., recommend weight loss, diet and exercise as appropriate   Other Active Problems  Leukocytosis WBC 6.1 ->11.8->9.8  Elevated Cre 1.03->1.03->0.97   DISCHARGE EXAM Blood pressure 138/75, pulse 65, temperature 97.6 F (36.4 C), temperature source Oral, resp. rate 20, height 5' (1.524 m), weight 95.4 kg, SpO2 96 %. [name] not in distress. Afebrile. Head is nontraumatic. Neck is supple without bruit.    Cardiac exam no murmur or gallop. Lungs are clear to auscultation. Distal pulses are well felt.  Neurological Exam ;  Awake  Alert oriented x 3. Normal speech and language.eye movements full without nystagmus But mild saccadic dysmetria on bilateral horizontal gaze. fundi were not visualized. Vision acuity and fields appear normal. Hearing is normal. Palatal movements are normal. Face symmetric. Tongue midline. Normal strength, tone, reflexes and coordination except subtle right FTN dysmetria and subtle R heel shin dysmetria. Normal sensation. Gait deferred.   Discharge Diet   Heart healthy thin  liquids  DISCHARGE PLAN  Disposition:  Return home  Due to hemorrhage and risk of bleeding, do not take aspirin, aspirin-containing medications, or ibuprofen products   Recommend MRI in 4 weeks to assess for resolution of cerebral hemorrhage  Ongoing risk factor control by Primary Care Physician at time of discharge  Dexamethasone 76m PO BID.  Metoclopramide (reglan) 10 mg PO q6hr PRN  Follow-up Koirala, [place], MD PCP in 2 weeks.  No neuro follow-up indicated  Follow-up Dr. MRon Ageeoncologist for next step  30 minutes were spent preparing discharge.  JLaurey Morale MSN, NP-C [facility] [phone] ATTENDING NOTE: I reviewed above note and agree with the assessment and plan. Pt was seen and examined.   Dr. NSherwood Gamblersaw patient last night and had excellent consultation note as well as neurosurgical plan for further care.  I really appreciate that.  Will defer to oncology team and neuro neurosurgery team for further treatment of brain metastasis.  Will continue dexamethasone 4 mg every 12 hours and continue Reglan as needed for nausea vomiting treatment.  Patient stable from neurological standpoint, and ready for discharge.  She will follow-up with oncology and neurosurgery as scheduled.  No neurology follow-up needed at this time but happy to see her if any concerns arise.  JRosalin Hawking MD PhD Stroke Neurology 01/31/2018 1:45 PM

## 2018-01-30 NOTE — Care Management Important Message (Signed)
Important Message  Patient Details  Name: Kristin Williams MRN: [id number] Date of Birth: 14-Dec-1945   Medicare Important Message Given:  Yes    [name] [address] 01/30/2018, 3:47 PM

## 2018-01-30 NOTE — Progress Notes (Addendum)
STROKE TEAM PROGRESS NOTE   SUBJECTIVE (INTERVAL HISTORY) Her family is at the bedside. Anxious to know next step in plan of care. Discussed current treatment, testing, results. Plans confirmed Dr. Sherwood Gambler to see pt prior to d/c today. He will be here after clinic. Anticipate d/c home following.   OBJECTIVE Vitals:   01/29/18 1941 01/30/18 0032 01/30/18 0420 01/30/18 0811  BP: (!) 150/66 (!) 114/48 (!) 119/56 134/60  Pulse: 62  (!) 58 64  Resp: 18   18  Temp: 98 F (36.7 C) 97.7 F (36.5 C) 98.5 F (36.9 C) 98.9 F (37.2 C)  TempSrc: Oral Oral Oral Oral  SpO2: 97% 97% 95% 98%  Weight:      Height:        CBC:  Recent Labs  Lab 01/27/18 0253  01/29/18 0457 01/30/18 0618  WBC 9.1   < > 11.8* 9.8  NEUTROABS 6.5  --   --   --   HGB 13.5   < > 12.7 12.3  HCT 42.6   < > 38.9 38.4  MCV 94.0   < > 91.1 92.1  PLT 261   < > 308 263   < > = values in this interval not displayed.    Basic Metabolic Panel:  Recent Labs  Lab 01/29/18 0457 01/30/18 0618  NA 137 136  K 4.1 4.4  CL 104 107  CO2 23 24  GLUCOSE 138* 135*  BUN 14 18  CREATININE 1.03* 0.97  CALCIUM 9.7 8.9    Lipid Panel:     Component Value Date/Time   CHOL 203 (H) 01/28/2018 0501   TRIG 79 01/28/2018 0501   HDL 59 01/28/2018 0501   CHOLHDL 3.4 01/28/2018 0501   VLDL 16 01/28/2018 0501   LDLCALC 128 (H) 01/28/2018 0501   HgbA1c:  Lab Results  Component Value Date   HGBA1C 5.4 01/27/2018    IMAGING Ct Head W & Wo Contrast  01/27/2018   1. Acute 10 cc cerebellar hemorrhage with mild mass effect on fourth ventricle, no hydrocephalus.  2. Otherwise negative CT HEAD with and without contrast.   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   PHYSICAL EXAM [name] not in distress. Afebrile. Head is nontraumatic. Neck is supple without  bruit.    Cardiac exam no murmur or gallop. Lungs are clear to auscultation. Distal pulses are well felt.  Neurological Exam ;  Awake  Alert oriented x 3. Normal speech and language.eye movements full without nystagmus But mild saccadic dysmetria on bilateral horizontal gaze. fundi were not visualized. Vision acuity and fields appear normal. Hearing is normal. Palatal movements are normal. Face symmetric. Tongue midline. Normal strength, tone, reflexes and coordination except subtle right FTN dysmetria and subtle R heel shin dysmetria. Normal sensation. Gait deferred.   ASSESSMENT/PLAN Kristin Williams is a 72 y.o. female with history of breast Ca, ASPVD, HLD, tobacco use, and COPD presenting with nausea and vertigo . She did not receive IV t-PA due to hemorrhage.  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 Sherwood Gambler to see pt this afternoon to determine next step in plan of care. Dr. Scharlene Gloss holding on planned cancer treatment until NS involvement determined.   Will discharge in anticipation of no urgent NS procedures. RN (Shaun) instructed to cancel d/c if otherwise.  Esophageal cancer  Showed on CT chest  Most Likely the source for brain mets  Dr. Jana Hakim on board  Will follow up with him as outpt  Breast cancer   Has been following with Dr. Jana Hakim  Less likely the source for brain met this time  Will follow up with Dr. Jana Hakim as  outpt  Hypertension  Stable  Long-term BP goal normotensive  Hyperlipidemia   Not on meds PTA  LDL 128  Given brain mets and hemorrhage, no statin recommended at this time  Tobacco abuse  Current smoker  Smoking cessation counseling provided  Pt is willing to quit  Other Stroke Risk Factors  Advanced age  Morbid Obesity, Body mass index is 41.08 kg/m., recommend weight loss, diet and exercise as appropriate   Other Active Problems  Leukocytosis WBC 6.1 ->11.8->9.8  Elevated Cre 1.03->1.03->0.97  Hospital day # 3   ATTENDING NOTE: I reviewed above note and agree with the assessment and plan. Pt was seen and examined.   Patient sitting in chair, daughter at bedside.  Awake alert, no acute distress, no complaints.  Eager to go home.  Pending Dr. Sherwood Gambler consultation today for further plan.  Continue dexamethasone at this time.  Kristin Hawking, MD PhD Stroke Neurology 01/31/2018 1:42 PM     To contact Stroke Continuity provider, please refer to [email or web address] After hours, contact General Neurology

## 2018-01-30 NOTE — Progress Notes (Signed)
Routed note to Dr. Erlinda Hong about Kristin Williams needing an MRI as an IP rather than waiting for discharge. Kristin Williams was discussed this morning during our Multidisciplinary brain and spine conference. The recommendation is to offer Pre-OP SRS depending on the patient's performance status.   Dr. Sherwood Gambler plans to come by and see Kristin Williams later this afternoon to discuss treatment options. In order for treatment planning to be done, she will need a 3T SRS Protocol MRI completed. Dr. Sherwood Gambler will evaluate Kristin Williams to determine if surgery is necessary, or radiation alone.   Requesting a brain scan be done w/o and w/ contrast on the 3T scanner using the SRS protocol.    Mont Dutton R.T.(R)(T) Special Procedures Navigator  [phone]

## 2018-01-30 NOTE — Progress Notes (Signed)
Physical Therapy Treatment Patient Details Name: Kristin Williams MRN: [id number] DOB: 06-Aug-1945 Today's Date: 01/30/2018    History of Present Illness Pt is a 72 y/o female with PMH significant for Cervical, vaginal and breast CA's, COPD, PVD admitted with 2 or more week h/o vertigo that fluctuated in intensity, but worsened afternoon of admission.  CT showed cerebellar hemmorhage.  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    PT Comments    Pt feeling much better today, no nausea or dizziness. Ambulated 300' without AD and practiced bkwds walking, changing pace and direction, and extinguishing distractions. Pt was unsteady at beginning of ambulation but improved with time up. Continue to recommend outpt PT for balance but will continue to assess if further intervention occurs. PT will continue to follow.    Follow Up Recommendations  Outpatient PT     Equipment Recommendations  None recommended by PT    Recommendations for Other Services       Precautions / Restrictions Precautions Precautions: Fall Restrictions Weight Bearing Restrictions: No    Mobility  Bed Mobility Overal bed mobility: Modified Independent             General bed mobility comments: pt has been up in room to bathroom and back, family supervising  Transfers Overall transfer level: Needs assistance Equipment used: None Transfers: Sit to/from Stand Sit to Stand: Supervision         General transfer comment: pt unsteady with initial standing. She demonstrates good self awareness of this and waits before ambulating to get her bearings  Ambulation/Gait Ambulation/Gait assistance: Min guard;Supervision Gait Distance (Feet): 300 Feet Assistive device: None Gait Pattern/deviations: Step-through pattern;Decreased stride length;Drifts right/left Gait velocity: decreased Gait velocity interpretation: 1.31 - 2.62 ft/sec, indicative of limited  community ambulator General Gait Details: first 150' level surface no distractions, slow pace and min-guard A for mild unsteadiness. Once up pt more steady and challenges were added and pt mobilizing with supervision. Discussed the need for caution when she is first up and in the night. She has to go downstairs for bathroom at home so will keep [facility] beside her bed   Stairs Stairs: Yes Stairs assistance: Supervision Stair Management: One rail Right;Alternating pattern;Forwards Number of Stairs: 4 General stair comments: safe with rail, no dizziness. HR 101 bpm O2 sats 95%   Wheelchair Mobility    Modified Rankin (Stroke Patients Only) Modified Rankin (Stroke Patients Only) Pre-Morbid Rankin Score: No symptoms Modified Rankin: Moderate disability     Balance Overall balance assessment: Needs assistance Sitting-balance support: Feet supported Sitting balance-Leahy Scale: Good     Standing balance support: No upper extremity supported Standing balance-Leahy Scale: Fair                 High Level Balance Comments: worked on bkwds walking, changes of pace and distractions while balancing.             Cognition Arousal/Alertness: Awake/alert Behavior During Therapy: WFL for tasks assessed/performed Overall Cognitive Status: Within Functional Limits for tasks assessed                                        Exercises      General Comments General comments (skin integrity, edema, etc.): O2 sats 97% HR 85 bpm on level surface ambulation      Pertinent Vitals/Pain Pain Assessment: No/denies  pain    Home Living                      Prior Function            PT Goals (current goals can now be found in the care plan section) Acute Rehab PT Goals Patient Stated Goal: to feel better PT Goal Formulation: With patient Time For Goal Achievement: 02/10/18 Potential to Achieve Goals: Good Progress towards PT goals: Progressing toward goals     Frequency    Min 3X/week      PT Plan Current plan remains appropriate    Co-evaluation              AM-PAC PT "6 Clicks" Daily Activity  Outcome Measure  Difficulty turning over in bed (including adjusting bedclothes, sheets and blankets)?: None Difficulty moving from lying on back to sitting on the side of the bed? : None Difficulty sitting down on and standing up from a chair with arms (e.g., wheelchair, bedside commode, etc,.)?: A Little Help needed moving to and from a bed to chair (including a wheelchair)?: A Little Help needed walking in hospital room?: None Help needed climbing 3-5 steps with a railing? : None 6 Click Score: 22    End of Session Equipment Utilized During Treatment: Gait belt Activity Tolerance: Patient tolerated treatment well Patient left: in bed;with call bell/phone within reach;with family/visitor present Nurse Communication: Mobility status PT Visit Diagnosis: Unsteadiness on feet (R26.81);Other abnormalities of gait and mobility (R26.89);Other symptoms and signs involving the nervous system (R29.898)     Time: 0160-1093 PT Time Calculation (min) (ACUTE ONLY): 23 min  Charges:  $Gait Training: 23-37 mins                     [place]  Pager [phone] Office [place] 01/30/2018, 12:25 PM

## 2018-01-30 NOTE — Consult Note (Signed)
Reason for Consult: Cerebellar brain metastasis Referring Physician: Dr. Lurline Del  Kristin Williams is an 72 y.o. left-handed white female.   HPI: Former patient of mine, whom I treated from 2009 through 2015 regarding lumbar degenerative disease, and whom I performed epidural steroid injections from 2009 through 2011.  Patient has developed difficulties over the past 6 weeks, including nausea, vomiting, vertigo, unsteadiness, dysphagia, painful swallowing, anorexia, and 30 pounds of weight loss.  During that 6 weeks the patient underwent a number of evaluations including several with her primary physician, at the [facility] emergency room on October 27, GI evaluation with Dr. Penelope Coop including EGD last week with biopsies of a distal esophageal tumor (family reports that the pathology report described esophageal carcinoma), CT of the chest, and 4 days ago CT of the head.  The CT of the head revealed a hemorrhagic lesion in the cerebellum just to the right of the midline.  She was sent as an outpatient to the emergency room for evaluation, and admitted to the stroke neurology service.  Patient has been seen in medical oncology consultation by Dr. Burney Gauze and Dr. Lurline Del.  Dr. Jana Hakim has been her medical oncologist for the past 14 years, after she underwent lumpectomy for breast cancer.  She was treated by him with chemotherapy and radiation therapy.  Work-up during this admission included MRI of the brain which shows a large, somewhat nodular cerebellar mass with associated intratumoral hemorrhage.  Although there is mass-effect on the fourth ventricle, the fourth ventricle is patent, and there is no hydrocephalus.  I was contacted this morning while in surgery by Mont Dutton, the [facility] navigator, after the patient's case was presented at neuro-oncology conference.  Neurosurgery input was sought by Dr. Jana Hakim, and the patient requested that I see her, because she has known me for many  years.  At this time she is overall doing better from a symptomatic perspective.  He has been started on Reglan, that is helped her nausea, and reduced her tendency to vomiting.  She also has been started on dexamethasone by Dr. Marin Olp, 20 mg daily.  She notes that she still has mild lightheadedness and mild unsteadiness when walking at times.  She also describes nausea times, and some continued anorexia.  Past Medical History:  Past Medical History:  Diagnosis Date  . Arthritis   . Cancer (HCC)    Cervical  . Cancer ([place])    Breast  . Cancer ([place])    Vaginal  . Complication of anesthesia   . COPD (chronic obstructive pulmonary disease) ([place])   . Headache(784.0)   . Hyperlipidemia   . Leg pain   . Peripheral vascular disease ([place])   . Personal history of chemotherapy   . Personal history of radiation therapy   . PONV (postoperative nausea and vomiting)     Past Surgical History:  Past Surgical History:  Procedure Laterality Date  . ABDOMINAL HYSTERECTOMY  1975  . BREAST LUMPECTOMY  2005  . GANGLION CYST EXCISION  1977  . ILIAC ARTERY STENT  04/20/2004   CSD right iliac occlusive disease    Family History:  Family History  Problem Relation Age of Onset  . Cancer Mother        Non-Hodgkins Disease T-Cell  . Heart failure Father   . COPD Father   . Heart disease Brother 76       MI and Heart Disease before age 44  . Heart attack Brother   . Breast cancer  Neg Hx     Social History:  reports that she has been smoking cigarettes. She has a 20.00 pack-year smoking history. She has never used smokeless tobacco. She reports that she does not drink alcohol or use drugs.  Allergies:  Allergies  Allergen Reactions  . Flonase [Fluticasone Propionate] Other (See Comments)    Nose bleeds    Medications: I have reviewed the patient's current medications.  ROS: Notable for those difficulties described in her history of present illness and past medical history.  Physical  Examination: Patient is a obese white female in no acute distress. Blood pressure (!) 146/67, pulse 60, temperature 97.7 F (36.5 C), temperature source Oral, resp. rate 18, height 5' (1.524 m), weight 95.4 kg, SpO2 95 %. Lungs: Clear to auscultation, symmetrical respiratory excursion. Heart: Regular rate and rhythm, normal S1 and S2.  No murmur. Abdomen: Soft, nondistended, bowel sounds present. Extremity: No edema.  Neurological Examination: Mental Status Examination: Awake alert, fully oriented to name, [facility] hospital, and November 2019.  Following commands.  Speech fluent. Cranial Nerve Examination: Pupils equal, round, about 2.5 mm bilaterally, reactive to light.  EOMI.  Facial sensation intact.  Facial movement symmetrical.  Hearing present bilaterally.  Palatal movement symmetrical.  Tongue midline. Motor Examination: 5/5 strength in the upper and lower extremities.  No drift of the upper extremities. Sensory Examination: Intact pinprick throughout. Reflex Examination:   Symmetrical. Gait and Stance Examination: Steady gait and stance, although she holds onto furniture her family members as she walks. Cerebellar: No dysmetria on finger-to-nose or heel-to-shin testing.  Assessment/Plan: Patient with a history of breast cancer in 2005, who has now been found to have a large distal esophageal mass, pathology (per family) is that of esophageal cancer.  She has been found to have a hemorrhagic lesion in the cerebellum, that is most suspicious for a hemorrhagic metastasis.  She has had significant symptoms of nausea, vomiting, vertigo, anorexia, unsteadiness, dysphagia, painful swallowing, and 30 pounds of weight loss.  Symptomatically she is doing fairly well at this time.  I spent over an hour and a half reviewing the patient's records, performing my history and examination, and with over half the time spent in discussions and counseling with the patient and over half dozen of her  family members, including her husband, daughter, etc.  I have also reviewed her case and imaging with my partner Dr. Kary Kos.  Certainly her overall medical management is deferred to her other treating physicians, and specifically her oncologic management is deferred to Dr. Jana Hakim.  However regarding the suspected hemorrhagic cerebellar metastasis, at this time I favor stereotactic radiosurgery.  I feel that the potential morbidity of surgical resection is significant, and likely would significantly delay her overall comprehensive oncologic treatment.  From a neurologic symptomatic perspective, her condition has improved and stabilized, and at this stage there is little to be gained from surgical resection.  Certainly formal radiation oncology consultation is needed, and I await their input.  So as to expedite the patient proceeding to stereotactic radiosurgery promptly, I have requested a 3T SRS BrainLab protocol MRI of the brain without and with gadolinium, and have spoken with the nursing staff asking that it be done this evening.  They will contact the MRI department.  Mont Dutton will coordinate the patient's simulation for [facility], and all of her appointments that will need to be scheduled for this week.   Certainly at the time of discharge the patient will need to be given prescriptions  for dexamethasone 4 mg every 12 hours and for what ever antiemetic regimen has been decided upon by her attending physicians for her post discharge care.  The patient's and her family's questions were answered in detail for them, and they appreciated the thoroughness of the consultation.  Hosie Spangle, MD 01/30/2018, 8:06 PM

## 2018-01-30 NOTE — Progress Notes (Signed)
Occupational Therapy Treatment Patient Details Name: Kristin Williams MRN: [id number] DOB: April 29, 1945 Today's Date: 01/30/2018    History of present illness Pt is a 72 y/o female with PMH significant for Cervical, vaginal and breast CA's, COPD, PVD admitted with 2 or more week h/o vertigo that fluctuated in intensity, but worsened afternoon of admission.  CT showed cerebellar hemmorhage.  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   OT comments  Patient progressing well, denies dizziness or nausea today.  Patient agreeable to shower chair for bathing, using 3:1 bedside commode upstairs (as bathroom is downstairs).  Completed mobility and simulated tub transfer with supervision.  Issued HEP for R UE strength and coordination re-training, verbalized and demonstrated understanding.  Will continue to follow.    Follow Up Recommendations  No OT follow up;Supervision/Assistance - 24 hour    Equipment Recommendations  3 in 1 bedside commode;Tub/shower seat(plans to purchase shower chari)    Recommendations for Other Services      Precautions / Restrictions Precautions Precautions: Fall Restrictions Weight Bearing Restrictions: No       Mobility Bed Mobility Overal bed mobility: Modified Independent             General bed mobility comments: seated EOB upon entry  Transfers Overall transfer level: Needs assistance Equipment used: None Transfers: Sit to/from Stand Sit to Stand: Supervision         General transfer comment: supervision for safety    Balance Overall balance assessment: Needs assistance Sitting-balance support: Feet supported Sitting balance-Leahy Scale: Good     Standing balance support: No upper extremity supported Standing balance-Leahy Scale: Fair                 High Level Balance Comments: worked on bkwds walking, changes of pace and distractions while balancing.            ADL  either performed or assessed with clinical judgement   ADL Overall ADL's : Needs assistance/impaired                         Toilet Transfer: Supervision/safety;Ambulation Toilet Transfer Details (indicate cue type and reason): simulated in room     Tub/ Shower Transfer: Tub transfer;Supervision/safety;Shower seat;Ambulation Tub/Shower Transfer Details (indicate cue type and reason): supervision for safety, to [facility] Functional mobility during ADLs: Supervision/safety General ADL Comments: supervision for safety      Vision       Perception     Praxis      Cognition Arousal/Alertness: Awake/alert Behavior During Therapy: WFL for tasks assessed/performed Overall Cognitive Status: Within Functional Limits for tasks assessed                                          Exercises Exercises: Other exercises Other Exercises Other Exercises: 10 reps RE UE, strengthnening with Level 2 theraband: flexion, foward press, overhead press, elbow flexion and chest pull with HEP provided    Shoulder Instructions       General Comments O2 sats 97% HR 85 bpm on level surface ambulation    Pertinent Vitals/ Pain       Pain Assessment: No/denies pain  Home Living  Prior Functioning/Environment              Frequency  Min 2X/week        Progress Toward Goals  OT Goals(current goals can now be found in the care plan section)  Progress towards OT goals: Progressing toward goals  Acute Rehab OT Goals Patient Stated Goal: to feel better OT Goal Formulation: With patient Time For Goal Achievement: 02/12/18 Potential to Achieve Goals: Good  Plan Discharge plan remains appropriate;Frequency remains appropriate    Co-evaluation                 AM-PAC PT "6 Clicks" Daily Activity     Outcome Measure   Help from another person eating meals?: None Help from another person taking care of  personal grooming?: None Help from another person toileting, which includes using toliet, bedpan, or urinal?: None Help from another person bathing (including washing, rinsing, drying)?: None Help from another person to put on and taking off regular upper body clothing?: None Help from another person to put on and taking off regular lower body clothing?: None 6 Click Score: 24    End of Session    OT Visit Diagnosis: Unsteadiness on feet (R26.81);Dizziness and giddiness (R42)   Activity Tolerance Patient tolerated treatment well   Patient Left with call bell/phone within reach;with family/visitor present;Other (comment)(seated EOB)   Nurse Communication Mobility status        Time: 1351-1405 OT Time Calculation (min): 14 min  Charges: OT General Charges $OT Visit: 1 Visit OT Treatments $Self Care/Home Management : 8-22 mins  Delight Stare, [place] Pager [phone] Office [phone]    Delight Stare 01/30/2018, 2:39 PM

## 2018-01-30 NOTE — Telephone Encounter (Signed)
Spoke with Ms. Devonshire's IP nurse, Lauren requesting that an order be placed for her planning MRI.   Brain w/o and w contrast Scan on the 3T scanner using the Open Brain Lab/ SRS Protocol  ( Treatment planning and surgical navigation )  The pt does have a stent that was placed in 2012. (RT Common Iliac - Genesis [id number] stent)  Information verified and stent is 3T compatible per [facility] MRI tech.    Mont Dutton R.T.(R)(T) Special Procedures Navigator

## 2018-01-30 NOTE — Progress Notes (Signed)
Initial Nutrition Assessment  DOCUMENTATION CODES:   Morbid obesity  INTERVENTION:  Discontinue Ensure.  Provide Boost Breeze po TID, each supplement provides 250 kcal and 9 grams of protein.  Encourage adequate PO intake.   NUTRITION DIAGNOSIS:   Inadequate oral intake related to poor appetite, nausea, vomiting as evidenced by per patient/family report.  GOAL:   Patient will meet greater than or equal to 90% of their needs  MONITOR:   PO intake, Supplement acceptance, Labs, Weight trends, I & O's, Skin  REASON FOR ASSESSMENT:   Malnutrition Screening Tool    ASSESSMENT:   72 y.o. female with history of breast Ca, ASPVD, HLD, esophageal Ca, tobacco use, and COPD presenting with nausea and vertigo. 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  Pt reports having on and off n/v over the past one month. Pt reports trying to eat, however during times of n/v, pt has not been able to keep po down. Per weight records, pt with a 7% weight loss in 1 month, which is significant for time frame. Pt unable to quantify amount of food usually eaten as it has been varied. Pt reports no difficulties with swallowing food. Pt with 90% intake at lunch today and reports her appetite has been improving since admission. Pt currently has Ensure ordered and has been refusing them. Pt agreeable to Boost Breeze instead. RD to order. Pt encouraged to eat her food at meals and to drink her supplements.   Unable to complete Nutrition-Focused physical exam at this time. RD to perform physical exam at next visit.   Labs and medications reviewed.   Diet Order:   Diet Order            Diet Heart Room service appropriate? Yes; Fluid consistency: Thin  Diet effective now              EDUCATION NEEDS:   Not appropriate for education at this time  Skin:  Skin Assessment: Reviewed RN Assessment  Last BM:  Unknown  Height:   Ht Readings from Last 1 Encounters:  01/27/18 5' (1.524  m)    Weight:   Wt Readings from Last 1 Encounters:  01/27/18 95.4 kg    Ideal Body Weight:  45.45 kg  BMI:  Body mass index is 41.08 kg/m.  Estimated Nutritional Needs:   Kcal:  1700-1850  Protein:  95-105 grams  Fluid:  1.7 - 1.9 L/day    Corrin Parker, MS, RD, LDN Pager # [phone] After hours/ weekend pager # [phone]

## 2018-01-31 ENCOUNTER — Ambulatory Visit
Admission: RE | Admit: 2018-01-31 | Discharge: 2018-01-31 | Disposition: A | Discharge status: Home / Self Care | Payer: Medicare Other | Source: Ambulatory Visit | Attending: Radiation Oncology | Admitting: Radiation Oncology

## 2018-01-31 ENCOUNTER — Encounter (HOSPITAL_COMMUNITY): Payer: Self-pay | Admitting: *Deleted

## 2018-01-31 ENCOUNTER — Inpatient Hospital Stay (HOSPITAL_COMMUNITY): Payer: Medicare Other

## 2018-01-31 ENCOUNTER — Other Ambulatory Visit: Payer: Self-pay | Admitting: Oncology

## 2018-01-31 ENCOUNTER — Ambulatory Visit
Admit: 2018-01-31 | Discharge: 2018-01-31 | Disposition: A | Discharge status: Home / Self Care | Payer: Medicare Other | Attending: Radiation Oncology | Admitting: Radiation Oncology

## 2018-01-31 ENCOUNTER — Ambulatory Visit (HOSPITAL_COMMUNITY): Payer: Medicare Other

## 2018-01-31 ENCOUNTER — Ambulatory Visit (HOSPITAL_BASED_OUTPATIENT_CLINIC_OR_DEPARTMENT_OTHER): Admission: RE | Admit: 2018-01-31 | Payer: Medicare Other | Source: Ambulatory Visit | Admitting: General Surgery

## 2018-01-31 ENCOUNTER — Other Ambulatory Visit: Payer: Self-pay

## 2018-01-31 DIAGNOSIS — C7931 Secondary malignant neoplasm of brain: Secondary | ICD-10-CM | POA: Diagnosis not present

## 2018-01-31 DIAGNOSIS — C159 Malignant neoplasm of esophagus, unspecified: Secondary | ICD-10-CM

## 2018-01-31 DIAGNOSIS — Z7952 Long term (current) use of systemic steroids: Secondary | ICD-10-CM | POA: Diagnosis not present

## 2018-01-31 DIAGNOSIS — B370 Candidal stomatitis: Secondary | ICD-10-CM | POA: Diagnosis not present

## 2018-01-31 DIAGNOSIS — Z853 Personal history of malignant neoplasm of breast: Secondary | ICD-10-CM | POA: Insufficient documentation

## 2018-01-31 DIAGNOSIS — Z808 Family history of malignant neoplasm of other organs or systems: Secondary | ICD-10-CM | POA: Diagnosis not present

## 2018-01-31 DIAGNOSIS — R110 Nausea: Secondary | ICD-10-CM

## 2018-01-31 DIAGNOSIS — C7949 Secondary malignant neoplasm of other parts of nervous system: Principal | ICD-10-CM

## 2018-01-31 DIAGNOSIS — R42 Dizziness and giddiness: Secondary | ICD-10-CM

## 2018-01-31 DIAGNOSIS — Z8501 Personal history of malignant neoplasm of esophagus: Secondary | ICD-10-CM | POA: Diagnosis not present

## 2018-01-31 DIAGNOSIS — Z923 Personal history of irradiation: Secondary | ICD-10-CM | POA: Diagnosis not present

## 2018-01-31 DIAGNOSIS — Z79899 Other long term (current) drug therapy: Secondary | ICD-10-CM | POA: Diagnosis not present

## 2018-01-31 DIAGNOSIS — Z510 Encounter for antineoplastic radiation therapy: Secondary | ICD-10-CM | POA: Insufficient documentation

## 2018-01-31 HISTORY — DX: Other specified postprocedural states: Z98.890

## 2018-01-31 HISTORY — DX: Nausea with vomiting, unspecified: R11.2

## 2018-01-31 HISTORY — DX: Adverse effect of unspecified anesthetic, initial encounter: T41.45XA

## 2018-01-31 HISTORY — DX: Other complications of anesthesia, initial encounter: T88.59XA

## 2018-01-31 SURGERY — BREAST LUMPECTOMY WITH RADIOACTIVE SEED AND SENTINEL LYMPH NODE BIOPSY
Anesthesia: General | Site: Breast | Laterality: Right

## 2018-01-31 MED ORDER — METOCLOPRAMIDE HCL 10 MG PO TABS: 10.0000 mg | ORAL_TABLET | Freq: Four times a day (QID) | ORAL | 2 refills | Status: DC | PRN

## 2018-01-31 MED ORDER — PROCHLORPERAZINE MALEATE 10 MG PO TABS: 10.0000 mg | ORAL_TABLET | Freq: Four times a day (QID) | ORAL | 1 refills | Status: DC | PRN

## 2018-01-31 MED ORDER — FLUCONAZOLE 100 MG PO TABS: ORAL_TABLET | ORAL | 0 refills | Status: DC

## 2018-01-31 MED ORDER — SODIUM CHLORIDE (PF) 0.9 % IJ SOLN
10.0000 mL | Freq: Once | INTRAMUSCULAR | Status: AC
  Administered 2018-01-31: 10 mL via INTRAVENOUS

## 2018-01-31 MED ORDER — METOCLOPRAMIDE HCL 10 MG PO TABS: 10.0000 mg | ORAL_TABLET | Freq: Four times a day (QID) | ORAL | Status: DC | PRN

## 2018-01-31 MED ORDER — DEXAMETHASONE 4 MG PO TABS: 4.0000 mg | ORAL_TABLET | Freq: Two times a day (BID) | ORAL | 2 refills | Status: DC

## 2018-01-31 MED ORDER — DEXAMETHASONE 4 MG PO TABS
4.0000 mg | ORAL_TABLET | Freq: Two times a day (BID) | ORAL | Status: DC
  Administered 2018-01-31: 4 mg via ORAL
  Filled 2018-01-31: qty 1

## 2018-01-31 MED ORDER — GADOBUTROL 1 MMOL/ML IV SOLN
10.0000 mL | Freq: Once | INTRAVENOUS | Status: AC | PRN
  Administered 2018-01-31: 10 mL via INTRAVENOUS

## 2018-01-31 NOTE — Progress Notes (Signed)
  Radiation Oncology         [phone] ________________________________  Name: Kristin Williams MRN: [id number]  Date: 01/31/2018  DOB: [birth year]  SIMULATION AND TREATMENT PLANNING NOTE; SPECIAL TREATMENT PROCEDURE NOTE:   DIAGNOSIS:    ICD-10-CM   1. 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  Identity was confirmed.  All relevant records and images related to the planned course of therapy were reviewed.  The patient freely provided informed written consent to proceed with treatment after reviewing the details related to the planned course of therapy. The consent form was witnessed and verified by the simulation staff. Intravenous access was established for contrast administration. Then, the patient was set-up in a stable reproducible supine position for radiation therapy.  A relocatable thermoplastic stereotactic head frame was fabricated for precise immobilization.  CT images were obtained.  Surface markings were placed.  The CT images were loaded into the planning software and fused with the patient's targeting MRI scan.  Then the target and avoidance structures were contoured.  Treatment planning then occurred.  The radiation prescription was entered and confirmed.  I have requested 3D planning  I have requested a DVH of the following structures: Brain stem, brain, left eye, right ete, lenses, optic chiasm, target volumes, uninvolved brain, and normal tissue.    PLAN:  The patient will receive 27 Gy in 3 fractions to the 3.0cm right cerebellar tumor. Stereotactic radiosurgery will be used.  SPECIAL TREATMENT PROCEDURE NOTE:   This constitutes a special treatment procedure due to the ablative dose that will be delivered and the technical nature of treatment.  This highly technical modality of treatment ensures that the ablative dose is centered on the patient's tumor while sparing normal tissues from excessive dose and risk  of detrimental effects.   -----------------------------------  Eppie Gibson, MD

## 2018-01-31 NOTE — Care Management Note (Signed)
Case Management Note  Patient Details  Name: Kristin Williams MRN: [id number] Date of Birth: 1945-10-29  Subjective/Objective:     Pt admitted with metastatic brain hemorrhage. She is from home with spouse.               Action/Plan: CM consulted for Outpatient therapy. CM met with the patient and she would like to attend at [facility]. Orders in Epic and information on the AVS. Pt with orders for 3 in 1. Pt provided orders and will pick up at outside vendor.  Pt has transportation home.    Expected Discharge Date:  01/31/18               Expected Discharge Plan:  OP Rehab  In-House Referral:     Discharge planning Services  CM Consult  Post Acute Care Choice:  Durable Medical Equipment Choice offered to:  Patient  DME Arranged:  3-N-1 DME Agency:     HH Arranged:    [place] Agency:     Status of Service:  Completed, signed off  If discussed at [employer] of Stay Meetings, dates discussed:    Additional Comments:  Pollie Friar, RN 01/31/2018, 10:15 AM

## 2018-01-31 NOTE — Progress Notes (Signed)
Has armband been applied?  Yes  Does patient have an allergy to IV contrast dye?: No   Has patient ever received premedication for IV contrast dye?: N/A  Does patient take metformin?: No  If patient does take metformin when was the last dose: N/A  Date of lab work: 01/30/18 BUN: 18 CR: 0.97 EGFR: 58  IV site: R AC  Has IV site been added to flowsheet?  Yes.

## 2018-01-31 NOTE — Progress Notes (Signed)
Location/Histology of Brain Tumor:  01/30/18 MRI IMPRESSION: 3 Tesla study does not disclose any additional lesions. Solitary metastasis in the right superior cerebellum measuring 2.9 x 2.6 x 3.0 cm with mild surrounding edema. 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  Patient presented with symptoms of:  She presented to the ED on 01/27/18 as requested by her PCP. An outpatient CT showed a cerebellar hemorrhage. She has recently been diagnosed with esophageal cancer  Past or anticipated interventions, if any, per neurosurgery:  Dr. Sherwood Williams- no surgery planned  Past or anticipated interventions, if any, per medical oncology:  01/29/18 Dr. 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 Either way we are dealing with stage IV carcinoma and it is important to note neither esophageal or breast cancer are curable when stage IV. -That does not mean there is no treatment. I think she will benefit from aggressive therapy to the solitary brain lesion and I will alert [facility] today to start the process going. On Monday I will alert the brain tumor team including Dr Kristin Williams and Dr Kristin Williams. -Once the brain issue has been addressed she can discuss therapy for the esophageal primary, which at a minimum will include radiation to alleviate the obstruction, but may also involve chemotherapy. -The right breast surgery has been postponed and may never need to be performed. Once she is stable I will probably start her on anastrozole and follow.  Dose of Decadron, if applicable: 4 mg every 12 hours  Recent neurologic symptoms, if any:   Seizures: No  Headaches: None in approximately six weeks   Nausea: Yes that  started six weeks ago  Dizziness/ataxia: Yes  Difficulty with hand coordination: Left hand dominent. Slightly sluggish with right hand.   Focal numbness/weakness: Denies numbness. Generalized weakness.   Visual deficits/changes: Floaters off and on for years. Hx of bilateral cataract surgery.  Confusion/Memory deficits: No  History of vertigo  1/2 cup oatmeal, 1/2 cup soap, chocolate ice cream, 1/2 cup of egg drop soup followed by vomiting. Fluid intake slim to none; maybe 16 oz per day.   Painful bone metastases at present, if any: No  SAFETY ISSUES:  Prior radiation? Yes, Left side  Pacemaker/ICD? No  Possible current pregnancy? No  Is the patient on methotrexate? No  Additional Complaints / other details: 72 years old. Resides in [place] with her husband. Patient has two children.

## 2018-01-31 NOTE — Progress Notes (Signed)
Radiation Oncology         [phone] ________________________________  Initial Outpatient Consultation  Name: Kristin Williams MRN: [id number]  Date: 01/31/2018  DOB: [birth year]  WU:[id number], [place]ibas, MD  Rolm Bookbinder, MD   REFERRING PHYSICIAN: Rolm Bookbinder, MD  DIAGNOSIS: The encounter diagnosis was Metastasis to brain [facility]).    ICD-10-CM   1. 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 female with a history of left breast cancer in 2005 status post breast conservation surgery and radiotherapy. She was recently diagnosed with right breast cancer (hormone sensitive, 63m on UKoreadated 01-04-18) and locally advanced esophageal cancer (11.7cm on CT chest dated 01-26-18). Biopsies by Dr GPenelope Coopduring EGD of esophagus reportedly showed squamous cell carcinoma, but I don't have the report at present.  She presented to her PCP with vertigo and dizziness x1 month. Outpatient CT of the head on 01/26/18 showed cerebellar hemorrhage, otherwise negative. She was sent to the ED on 01/27/18 where a brain MRI showed a 2.9 cm hemorrhagic cerebellar mass most consistent with solitary brain metastasis. 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  She was seen by Dr. MJana Hakimwhile in the hospital, and he recommended aggressive therapy to the solitary brain lesion.   MRI of the brain performed today demonstrates a solitary metastasis in the right superior cerebellum measuring 2.9 x 2.6 x 3.0 cm with mild surrounding edema. 3 Tesla study does not disclose any additional lesions.  On review of systems, the patient is positive for nausea, dizziness, some pain with swallowing in her lower esophagus, constipation alleviated by Senna, weight loss, and recent vomiting related to nausea.  She is on Decadron 4 mg every 12  hours.   PREVIOUS RADIATION THERAPY: Yes - Left breast in 2005  PAST MEDICAL HISTORY:  has a past medical history of Arthritis, Cancer ([place], Cancer ([place], [place] ([place], Complication of anesthesia, COPD (chronic obstructive pulmonary disease) ([place], Headache(784.0), Hyperlipidemia, Leg pain, Peripheral vascular disease ([place], Personal history of chemotherapy, Personal history of radiation therapy, and PONV (postoperative nausea and vomiting).    PAST SURGICAL HISTORY: Past Surgical History:  Procedure Laterality Date  . ABDOMINAL HYSTERECTOMY  1975  . BREAST LUMPECTOMY  2005  . GANGLION CYST EXCISION  1977  . ILIAC ARTERY STENT  04/20/2004   CSD right iliac occlusive disease    FAMILY HISTORY: family history includes COPD in her father; Cancer in her cousin and mother; Heart attack in her brother; Heart disease (age of onset: 567 in her brother; Heart failure in her father; Lung cancer in her maternal uncle; Prostate cancer in her brother and maternal uncle.  SOCIAL HISTORY:  reports that she quit smoking 4 days ago. Her smoking use included cigarettes. She has a 20.00 pack-year smoking history. She has never used smokeless tobacco. She reports that she does not drink alcohol or use drugs.  ALLERGIES: Flonase [fluticasone propionate]  MEDICATIONS:  Current Outpatient Medications  Medication Sig Dispense Refill  . dexamethasone (DECADRON) 4 MG tablet Take 1 tablet (4 mg total) by mouth every 12 (twelve) hours. 60 tablet 2  . metoCLOPramide (REGLAN) 10 MG tablet Take 1 tablet (10 mg total) by mouth every 6 (six) hours as needed for nausea. 60 tablet 2  . omeprazole (PRILOSEC) 20 MG capsule Take 40 mg by mouth daily.     .Marland Kitchen  fluconazole (DIFLUCAN) 100 MG tablet Take 2 tablets today, then 1 tablet daily x 20 more days. 22 tablet 0  . LORazepam (ATIVAN) 1 MG tablet Take 1 tablet (1 mg total) by mouth 3 (three) times daily as needed for anxiety. (Patient not taking: Reported on 01/31/2018) 15  tablet 0  . ondansetron (ZOFRAN) 8 MG tablet Take 8 mg by mouth every 6 (six) hours as needed for nausea or vomiting.   0  . prochlorperazine (COMPAZINE) 10 MG tablet Take 1 tablet (10 mg total) by mouth every 6 (six) hours as needed for nausea or vomiting. 60 tablet 1   No current facility-administered medications for this encounter.     REVIEW OF SYSTEMS:  A 10+ POINT REVIEW OF SYSTEMS WAS OBTAINED including neurology, dermatology, psychiatry, cardiac, respiratory, lymph, extremities, GI, GU, Musculoskeletal, constitutional, breasts, reproductive, HEENT.  All pertinent positives are noted in the HPI.  All others are negative.   PHYSICAL EXAM:  height is 5' (1.524 m) and weight is 212 lb 6.4 oz (96.3 kg). Her oral temperature is 98.2 F (36.8 C). Her blood pressure is 144/70 (abnormal) and her pulse is 61. Her respiration is 20 and oxygen saturation is 98%.   General: Alert and oriented, in no acute distress. HEENT: Head is normocephalic. Extraocular movements are intact, no nystagmus. Thrush in her throat. Neck: Neck is supple, no palpable cervical or supraclavicular lymphadenopathy. Heart: Regular in rate and rhythm with no murmurs, rubs, or gallops. Chest: She has expiratory wheezes. Abdomen: Soft, nontender, nondistended, with no rigidity or guarding. Extremities: No cyanosis or edema. Lymphatics: see Neck Exam Skin: No concerning lesions. Musculoskeletal: symmetric strength and muscle tone throughout. Neurologic: Cranial nerves II through XII are grossly intact. No obvious focalities. Speech is fluent. Coordination is intact. Finger to nose testing is intact. Psychiatric: Judgment and insight are intact. Affect is appropriate.  KPS = 80 100 - Normal; no complaints; no evidence of disease. 90   - Able to carry on normal activity; minor signs or symptoms of disease. 80   - Normal activity with effort; some signs or symptoms of disease. 33   - Cares for self; unable to carry on normal  activity or to do active work. 60   - Requires occasional assistance, but is able to care for most of his personal needs. 50   - Requires considerable assistance and frequent medical care. 58   - Disabled; requires special care and assistance. 59   - Severely disabled; hospital admission is indicated although death not imminent. 63   - Very sick; hospital admission necessary; active supportive treatment necessary. 10   - Moribund; fatal processes progressing rapidly. 0     - Dead  Karnofsky DA, Abelmann [place], Craver LS and Kersey JH [phone]) The use of the nitrogen mustards in the palliative treatment of carcinoma: with particular reference to bronchogenic carcinoma Cancer 1 634-56   LABORATORY DATA:  Lab Results  Component Value Date   WBC 9.8 01/30/2018   HGB 12.3 01/30/2018   HCT 38.4 01/30/2018   MCV 92.1 01/30/2018   PLT 263 01/30/2018   CMP     Component Value Date/Time   NA 136 01/30/2018 0618   K 4.4 01/30/2018 0618   CL 107 01/30/2018 0618   CO2 24 01/30/2018 0618   GLUCOSE 135 (H) 01/30/2018 0618   BUN 18 01/30/2018 0618   CREATININE 0.97 01/30/2018 0618   CALCIUM 8.9 01/30/2018 0618   PROT 6.6 01/27/2018 0253   ALBUMIN  3.4 (L) 01/27/2018 0253   AST 16 01/27/2018 0253   ALT 17 01/27/2018 0253   ALKPHOS 92 01/27/2018 0253   BILITOT 0.5 01/27/2018 0253   GFRNONAA 58 (L) 01/30/2018 0618   GFRAA >60 01/30/2018 0618         RADIOGRAPHY: Ct Head W & Wo Contrast  Addendum Date: 01/27/2018   ADDENDUM REPORT: 01/27/2018 01:36 ADDENDUM: Critical Value/emergent results were called by telephone at the time of interpretation on 01/27/2018 at 1:30 am to Dr. Anson Fret , who verbally acknowledged these results. Electronically Signed   By: Elon Alas M.D.   On: 01/27/2018 01:36   Result Date: 01/27/2018 CLINICAL DATA:  Vertigo and dizziness for 2 months. History of breast cancer and esophageal mass. EXAM: CT HEAD WITHOUT AND WITH CONTRAST TECHNIQUE: Contiguous axial  images were obtained from the base of the skull through the vertex without and with intravenous contrast CONTRAST:  61m ISOVUE-300 IOPAMIDOL (ISOVUE-300) INJECTION 61% COMPARISON:  CT HEAD December 06, 2003 FINDINGS: BRAIN: 2.6 x 2.6 x 3 cm (volume = 10 cc) RIGHT parasagittal cerebellar hemorrhage with surrounding low-density vasogenic edema. Mild mass effect on fourth ventricle without hydrocephalus. No parenchymal brain volume loss for age. No abnormal intracranial enhancement. LEFT cerebellar developmental venous anomaly. No acute large vascular territory infarct. No abnormal extra-axial fluid collections. Basal cisterns are patent. VASCULAR: Unremarkable. SKULL: No skull fracture. No destructive bony lesions. 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 No significant scalp soft tissue swelling. SINUSES/ORBITS: Trace paranasal sinus mucosal thickening. Mastoid air cells are well aerated.The included ocular globes and orbital contents are non-suspicious. OTHER: None. IMPRESSION: 1. Acute 10 cc cerebellar hemorrhage with mild mass effect on fourth ventricle, no hydrocephalus. 2. Otherwise negative CT HEAD with and without contrast. 3. Ordering clinician has been paged at time of study interpretation, awaiting return call. Electronically Signed: By: CElon AlasM.D. On: 01/27/2018 01:24   Ct Chest W Contrast  Result Date: 01/27/2018 CLINICAL DATA:  Tumor of the esophagus. EXAM: CT CHEST WITH CONTRAST TECHNIQUE: Multidetector CT imaging of the chest was performed during intravenous contrast administration. CONTRAST:  776mISOVUE-300 IOPAMIDOL (ISOVUE-300) INJECTION 61% COMPARISON:  01/19/2017 FINDINGS: Cardiovascular: The heart size appears normal. Aortic atherosclerosis. Calcifications within the LAD, left circumflex and RCA coronary arteries noted. No pericardial effusion. Mediastinum/Nodes: Normal appearance of the thyroid gland. The trachea appears patent and is midline. There is  masslike thickening involving the distal half of the esophagus. The tumor spans approximately 11.7 cm beginning at the T5 level and extending to just above a small hiatal hernia. The single wall thickness of the esophagus measures up to 1.5 cm. No axillary or supraclavicular adenopathy. High right paratracheal lymph node is new from 01/15/2016 measuring 8 mm. Lymph node between the esophagus and descending thoracic aorta measures 1.2 cm, image 114/2. Lungs/Pleura: No pleural effusion. Mild changes of emphysema. Right lower lobe lung nodule measures 5 mm, image 101/5. Also stable from 01/15/2016. No new or enlarging pulmonary nodules. 1.3 cm right middle lobe lung nodule is identified, image 93/5. Unchanged from 01/15/2016. Upper Abdomen: No acute abnormality identified. Small hiatal hernia. Unchanged left adrenal nodules. Musculoskeletal: No chest wall abnormality. No acute or significant osseous findings. Post treatment changes within the medial left breast noted. IMPRESSION: 1. Large mass involving the distal half of the esophagus spanning approximately 11.7 cm is identified compatible with primary esophageal neoplasm. 2. Small posterior mediastinal and high right paratracheal lymph nodes are noted which may represent foci of metastatic adenopathy.  3. The small pulmonary nodules in the right lung are unchanged from 01/15/2016 and are favored to represent a benign process. 4. Aortic Atherosclerosis (ICD10-I70.0) and Emphysema (ICD10-J43.9). 5. Coronary artery atherosclerotic calcifications. Electronically Signed   By: Kerby Moors M.D.   On: 01/27/2018 11:23   Mr Jeri Cos LD Contrast  Result Date: 01/31/2018 CLINICAL DATA:  Vertigo and vomiting over the last 6 weeks. Weight loss. Esophageal cancer. Cerebellar metastasis. EXAM: MRI HEAD WITHOUT AND WITH CONTRAST TECHNIQUE: Multiplanar, multiecho pulse sequences of the brain and surrounding structures were obtained without and with intravenous contrast.  CONTRAST:  10 cc Gadavist COMPARISON:  01/27/2018 FINDINGS: Brain: No additional lesions are disclosed. There is a 2.9 x 2.6 x 3.0 cm metastasis in the right superior cerebellum with somewhat lobular contours and mild surrounding vasogenic edema. The lesion is uniformly hyperdense at CT in shows fairly uniform low T2 signal on MRI, favoring psammomatous calcification rather than hemorrhage. There are probably some minor petechial hemorrhagic components. Elsewhere, there are mild to moderate chronic small-vessel ischemic changes of the cerebral hemispheric white matter. No hydrocephalus or extra-axial collection. Vascular: Major vessels at the base of the brain show flow. Skull and upper cervical spine: Negative Sinuses/Orbits: Clear/normal Other: None IMPRESSION: 3 Tesla study does not disclose any additional lesions. Solitary metastasis in the right superior cerebellum measuring 2.9 x 2.6 x 3.0 cm with mild surrounding edema. 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 Electronically Signed   By: Nelson Chimes M.D.   On: 01/31/2018 06:33   Mr Jeri Cos JT Contrast  Result Date: 01/27/2018 CLINICAL DATA:  Vertigo for 2 weeks. Cerebellar hemorrhage on CT. Esophageal tumor. EXAM: MRI HEAD WITHOUT AND WITH CONTRAST TECHNIQUE: Multiplanar, multiecho pulse sequences of the brain and surrounding structures were obtained without and with intravenous contrast. CONTRAST:  9.5 mL Gadavist COMPARISON:  Head CT 01/26/2018 FINDINGS: Brain: A right parasagittal cerebellar hemorrhage has not significantly changed in size from yesterday CT measuring 2.9 x 2.7 x 2.5 cm. The hemorrhage is heterogeneously T1 hyperintense and T2 hypointense, and there is associated enhancement including nodular foci of internal enhancement. There is mild surrounding vasogenic edema with partial effacement of the fourth ventricle but no obstructive hydrocephalus. No other enhancing intracranial lesions are identified. There is no evidence of acute infarct or  extra-axial fluid collection. Cerebral volume is normal for age. Scattered small foci of T2 hyperintensity in the cerebral white matter nonspecific but compatible with minimal chronic small vessel ischemic disease. Vascular: Major intracranial vascular flow voids are preserved. Skull and upper cervical spine: Unremarkable bone marrow signal. Sinuses/Orbits: Bilateral cataract extraction. Opacification of a single left ethmoid air cell. Clear mastoid air cells. Other: None. IMPRESSION: 2.9 cm hemorrhagic cerebellar mass most consistent with solitary brain metastasis. 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 Electronically Signed   By: Logan Bores M.D.   On: 01/27/2018 14:27   US Abdomen Complete  Result Date: 01/25/2018 CLINICAL DATA:  Abdominal pain with nausea and vomiting EXAM: ABDOMEN ULTRASOUND COMPLETE COMPARISON:  None. FINDINGS: Gallbladder: No gallstones or wall thickening visualized. There is no pericholecystic fluid. No sonographic Murphy sign noted by sonographer. Common bile duct: Diameter: 4 mm. No intrahepatic, common hepatic, or common bile duct dilatation. Liver: No focal lesion identified. Within normal limits in parenchymal echogenicity. Portal vein is patent on color Doppler imaging with normal direction of blood flow towards the liver. IVC: No abnormality visualized. Pancreas: Visualized portion unremarkable. Portions of pancreatic tail obscured by gas. Spleen: Size and appearance within normal limits. Right  Kidney: Length: 11.5 cm. Echogenicity within normal limits. No mass or hydronephrosis visualized. Left Kidney: Length: 11.2 cm. Echogenicity within normal limits. No hydronephrosis visualized. There is a cyst arising from the lower pole left kidney measuring 1.8 x 1.5 x 1.6 cm. Abdominal aorta: No aneurysm visualized. Other findings: No demonstrable ascites. IMPRESSION: 1. Portions of pancreas obscured by gas. Visualized portions of pancreas appear normal. 2.  Cyst arising  from lower pole left kidney. 3.  Study otherwise unremarkable. Electronically Signed   By: Lowella Grip III M.D.   On: 01/25/2018 10:22   Ct Abdomen Pelvis W Contrast  Result Date: 01/08/2018 CLINICAL DATA:  Patient went etiology is started having abdominal pain and diarrhea with dizziness. Patient had been dealing with vertigo over the past week. EXAM: CT ABDOMEN AND PELVIS WITH CONTRAST TECHNIQUE: Multidetector CT imaging of the abdomen and pelvis was performed using the standard protocol following bolus administration of intravenous contrast. CONTRAST:  120m ISOVUE-300 IOPAMIDOL (ISOVUE-300) INJECTION 61% COMPARISON:  None FINDINGS: Lower chest: Moderate sized hiatal hernia. Normal size heart. No active pulmonary disease. Hepatobiliary: No focal liver abnormality is seen. No gallstones, gallbladder wall thickening, or biliary dilatation. Pancreas: Unremarkable. No pancreatic ductal dilatation or surrounding inflammatory changes. Spleen: Normal in size without focal abnormality. Adrenals/Urinary Tract: Normal right adrenal gland. 1.7 cm nodule in the anterior limb of the left adrenal gland likely to represent a small adenoma. Water attenuating cyst off the lower pole of the left kidney measuring up to 1.8 cm. No nephrolithiasis nor hydroureteronephrosis. Tiny focus of air along the nondependent portion of the urinary bladder possibly secondary to intervention. Stomach/Bowel: The stomach, duodenum, ligament Treitz, small intestine and colon are unremarkable. No bowel obstruction or inflammation. Normal appearing appendix. Vascular/Lymphatic: Moderate aortoiliac atherosclerosis. No adenopathy. Reproductive: Hysterectomy.  No adnexal mass. Other: No abdominal wall hernia or abnormality. No abdominopelvic ascites. Musculoskeletal: Mild grade 1 anterolisthesis of L3 on L4 and L4 on L5 associated with degenerative disc and facet arthropathy. Multilevel degenerative facet arthropathy is seen along the entirety  of the lumbar spine. Degenerative disc disease also noted L1-2 with vacuum disc phenomenon. No aggressive osseous lesions or fracture. IMPRESSION: 1. Nonobstructed, nondistended bowel. No acute inflammatory process. 2. Moderate-sized hiatal hernia noted. 3. Low-density 1.7 cm left adrenal nodule likely to represent a small adenoma. Lower pole left renal cyst measuring 1.8 cm. 4. Moderate aortic atherosclerosis. 5. Lumbar spondylosis and facet arthropathy. Electronically Signed   By: DAshley RoyaltyM.D.   On: 01/08/2018 17:24   UKoreaBreast Ltd Uni Right Inc Axilla  Result Date: 01/04/2018 CLINICAL DATA:  Patient returns today to evaluate a possible RIGHT breast mass identified on recent screening mammogram.History of LEFT breast cancer in 2005 status post breast conservation surgery. EXAM: DIGITAL DIAGNOSTIC RIGHT MAMMOGRAM WITH CAD AND TOMO ULTRASOUND RIGHT BREAST COMPARISON:  Previous exams including recent screening mammogram dated 12/30/2017. ACR Breast Density Category b: There are scattered areas of fibroglandular density. FINDINGS: On today's additional diagnostic views, there is a small irregular mass confirmed within the lower RIGHT breast, at anterior depth, measuring approximately 5 mm greatest dimension. Mammographic images were processed with CAD. Targeted ultrasound is performed, showing an irregular hypoechoic mass in the RIGHT breast at the 6 o'clock axis, 2 cm from the nipple, measuring 6 mm, with internal vascularity, with suspicious anti parallel orientation, corresponding to the mammographic finding. RIGHT axilla was evaluated with ultrasound showing no enlarged or morphologically abnormal lymph nodes. IMPRESSION: Suspicious mass within the RIGHT breast at the  6 o'clock axis, 2 cm from the nipple, measuring 6 mm, corresponding to the mammographic finding. Ultrasound-guided biopsy is recommended. RECOMMENDATION: Ultrasound-guided biopsy for the RIGHT breast mass at the 6 o'clock axis, 2 cm from the  nipple, measuring 6 mm. Ultrasound-guided biopsy is scheduled for October 29th. I have discussed the findings and recommendations with the patient. Results were also provided in writing at the conclusion of the visit. If applicable, a reminder letter will be sent to the patient regarding the next appointment. BI-RADS CATEGORY  4: Suspicious. Electronically Signed   By: Franki Cabot M.D.   On: 01/04/2018 15:11   Mm Diag Breast Tomo Uni Right  Result Date: 01/04/2018 CLINICAL DATA:  Patient returns today to evaluate a possible RIGHT breast mass identified on recent screening mammogram.History of LEFT breast cancer in 2005 status post breast conservation surgery. EXAM: DIGITAL DIAGNOSTIC RIGHT MAMMOGRAM WITH CAD AND TOMO ULTRASOUND RIGHT BREAST COMPARISON:  Previous exams including recent screening mammogram dated 12/30/2017. ACR Breast Density Category b: There are scattered areas of fibroglandular density. FINDINGS: On today's additional diagnostic views, there is a small irregular mass confirmed within the lower RIGHT breast, at anterior depth, measuring approximately 5 mm greatest dimension. Mammographic images were processed with CAD. Targeted ultrasound is performed, showing an irregular hypoechoic mass in the RIGHT breast at the 6 o'clock axis, 2 cm from the nipple, measuring 6 mm, with internal vascularity, with suspicious anti parallel orientation, corresponding to the mammographic finding. RIGHT axilla was evaluated with ultrasound showing no enlarged or morphologically abnormal lymph nodes. IMPRESSION: Suspicious mass within the RIGHT breast at the 6 o'clock axis, 2 cm from the nipple, measuring 6 mm, corresponding to the mammographic finding. Ultrasound-guided biopsy is recommended. RECOMMENDATION: Ultrasound-guided biopsy for the RIGHT breast mass at the 6 o'clock axis, 2 cm from the nipple, measuring 6 mm. Ultrasound-guided biopsy is scheduled for October 29th. I have discussed the findings and  recommendations with the patient. Results were also provided in writing at the conclusion of the visit. If applicable, a reminder letter will be sent to the patient regarding the next appointment. BI-RADS CATEGORY  4: Suspicious. Electronically Signed   By: Franki Cabot M.D.   On: 01/04/2018 15:11   Mm Clip Placement Right  Result Date: 01/10/2018 CLINICAL DATA:  Evaluate biopsy clip EXAM: DIAGNOSTIC RIGHT MAMMOGRAM POST ULTRASOUND BIOPSY COMPARISON:  Previous exam(s). FINDINGS: Mammographic images were obtained following ultrasound guided biopsy of a right breast mass. The ribbon shaped biopsy clip is within the biopsied mass. IMPRESSION: The ribbon shaped clip is within the biopsied mass. 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 This was found to be concordant by Dr. Dorise Bullion. Pathology results were discussed with the patient by telephone. The patient reported doing well after the biopsy with tenderness at the site. Post biopsy instructions and care were reviewed and questions were answered. The patient was encouraged to call [facility] for any additional concerns. Surgical consultation has been arranged with Dr. Rolm Bookbinder, per patient requested, at [facility] on January 18, 2018. Pathology results reported by Terie Purser, RN on 01/11/2018. Electronically Signed  By: Dorise Bullion III M.D   On: 01/11/2018 14:15   Result Date: 01/13/2018 CLINICAL DATA:  Right breast mass EXAM: ULTRASOUND GUIDED RIGHT BREAST CORE NEEDLE BIOPSY COMPARISON:  Previous exam(s). FINDINGS: I met with the  patient and we discussed the procedure of ultrasound-guided biopsy, including benefits and alternatives. We discussed the high likelihood of a successful procedure. We discussed the risks of the procedure, including infection, bleeding, tissue injury, clip migration, and inadequate sampling. Informed written consent was given. The usual time-out protocol was performed immediately prior to the procedure. Lesion quadrant: Lower-outer Using sterile technique and 1% Lidocaine as local anesthetic, under direct ultrasound visualization, a 12 gauge spring-loaded device was used to perform biopsy of the right breast mass using a lateral approach. At the conclusion of the procedure a tissue marker clip was deployed into the biopsy cavity. Follow up 2 view mammogram was performed and dictated separately. IMPRESSION: Ultrasound guided biopsy of the right breast mass. No apparent complications. Electronically Signed: By: Dorise Bullion III M.D On: 01/10/2018 16:19      IMPRESSION/PLAN: This is a very pleasant 72 y.o. 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  She was presented at CNS tumor board this week.  I concur with Dr. Donnella Bi recommendation to treat this definitively with radiosurgery. He will hold resection due to the potential risks of craniotomy following radiosurgery.   I had a lengthy discussion with the patient after reviewing her MRI results with her.  We spoke about stereotactic radiosurgery to the brain. We spoke about the risks, benefits, and side effects of this treatment. Additionally, we spoke about radionecrosis that can result from stereotactic radiosurgery. I explained that stereotactic radiosurgery only treats the areas of gross disease while sparing the rest of the brain parenchyma.  After lengthy discussion, the patient would like to proceed with stereotactic brain radiosurgery to her metastatic disease in the brain.  We discussed, among  other risks, the risks of seizures and neurologic decline as possible side effects of treatment. Consent signed today.  CT simulation will take place today and treatment on Friday November 22nd. She will get 3 fractions to reduce the risk of peritumoral edema.   For her nausea, I gave her a prescription for Compazine as the Zofran is not working well and she will continue Reglan.  She was given a prescription for fluconazole for her thrush.  I referred her to our nutritionist for her weight loss.  The patient knows that she will be seeing Dr. Burr Medico to discuss systemic therapy for her advanced esophageal cancer, and at this time I don't recommend any radiotherapy to her esophagus until her systemic options are reviewed. She understands that palliative radiotherapy to the esophagus is an option, but it's possible that systemic therapy alone will be recommended.  __________________________________________   Eppie Gibson, MD  This document serves as a record of services personally performed by Eppie Gibson, MD. It was created on her behalf by Rae Lips, a trained medical scribe. The creation of this record is based on the scribe's personal observations and the provider's statements to them. This document has been checked and approved by the attending provider.

## 2018-01-31 NOTE — Progress Notes (Signed)
SLP Cancellation Note  Patient Details Name: Kristin Williams MRN: [id number] DOB: 01-Jan-1946   Cancelled treatment:        Patient is actively discharging. She is reporting to [facility] to begin outpatient radiation. Spoke with pt and family about speech/language therapy services. Recommend an outpatient evaluation when pt has completed radiation to address any needs/deficits at that time.   Charlynne Cousins Lelania Bia, MA, CCC-SLP 01/31/2018 9:18 AM

## 2018-01-31 NOTE — Progress Notes (Signed)
NURSING PROGRESS NOTE  Kristin Williams [id number] Discharge Data: 01/31/2018 9:25 AM Attending Provider: Rosalin Hawking, MD PJK:DTOIZTI, [place]ibas, MD     Alben Deeds to be D/C'd Home per MD order.  Discussed with the patient the After Visit Summary and all questions fully answered. All IV's discontinued with no bleeding noted. All belongings returned to patient for patient to take home.   Last Vital Signs:  Blood pressure 138/75, pulse 65, temperature 97.6 F (36.4 C), temperature source Oral, resp. rate 20, height 5' (1.524 m), weight 95.4 kg, SpO2 96 %.  Discharge Medication List Allergies as of 01/31/2018      Reactions   Flonase [fluticasone Propionate] Other (See Comments)   Nose bleeds      Medication List    TAKE these medications   dexamethasone 4 MG tablet Commonly known as:  DECADRON Take 1 tablet (4 mg total) by mouth every 12 (twelve) hours.   LORazepam 1 MG tablet Commonly known as:  ATIVAN Take 1 tablet (1 mg total) by mouth 3 (three) times daily as needed for anxiety.   metoCLOPramide 10 MG tablet Commonly known as:  REGLAN Take 1 tablet (10 mg total) by mouth every 6 (six) hours as needed for nausea.   omeprazole 20 MG capsule Commonly known as:  PRILOSEC Take 40 mg by mouth daily.   ondansetron 8 MG tablet Commonly known as:  ZOFRAN Take 8 mg by mouth every 6 (six) hours as needed for nausea or vomiting.

## 2018-01-31 NOTE — Progress Notes (Signed)
See progress note under physician encounter. 

## 2018-02-01 ENCOUNTER — Telehealth: Payer: Self-pay | Admitting: Hematology

## 2018-02-01 DIAGNOSIS — Z853 Personal history of malignant neoplasm of breast: Secondary | ICD-10-CM | POA: Diagnosis not present

## 2018-02-01 DIAGNOSIS — C7931 Secondary malignant neoplasm of brain: Secondary | ICD-10-CM | POA: Diagnosis not present

## 2018-02-01 DIAGNOSIS — C159 Malignant neoplasm of esophagus, unspecified: Secondary | ICD-10-CM | POA: Diagnosis not present

## 2018-02-01 DIAGNOSIS — Z510 Encounter for antineoplastic radiation therapy: Secondary | ICD-10-CM | POA: Diagnosis not present

## 2018-02-01 NOTE — Telephone Encounter (Signed)
Pt has been scheduled to see Dr. Burr Medico on 11/22 at 230pm. Pt aware to arrive 15 minutes early. Voiced understanding.

## 2018-02-02 ENCOUNTER — Telehealth: Payer: Self-pay | Admitting: Hematology

## 2018-02-02 NOTE — Telephone Encounter (Signed)
Scheduled NUT and Genetics - per patient request to hold off on both for now - genetics already done else where .

## 2018-02-03 ENCOUNTER — Encounter: Payer: Self-pay | Admitting: General Practice

## 2018-02-03 ENCOUNTER — Inpatient Hospital Stay: Payer: Medicare Other | Attending: Hematology | Admitting: Hematology

## 2018-02-03 ENCOUNTER — Ambulatory Visit
Admission: RE | Admit: 2018-02-03 | Discharge: 2018-02-03 | Disposition: A | Discharge status: Home / Self Care | Payer: Medicare Other | Source: Ambulatory Visit | Attending: Radiation Oncology | Admitting: Radiation Oncology

## 2018-02-03 ENCOUNTER — Encounter: Payer: Self-pay | Admitting: Hematology

## 2018-02-03 VITALS — BP 149/77 | HR 70 | Temp 97.6°F | Resp 18 | Ht 60.0 in | Wt 212.0 lb

## 2018-02-03 DIAGNOSIS — J449 Chronic obstructive pulmonary disease, unspecified: Secondary | ICD-10-CM | POA: Diagnosis not present

## 2018-02-03 DIAGNOSIS — C7931 Secondary malignant neoplasm of brain: Secondary | ICD-10-CM | POA: Diagnosis not present

## 2018-02-03 DIAGNOSIS — Z801 Family history of malignant neoplasm of trachea, bronchus and lung: Secondary | ICD-10-CM | POA: Diagnosis not present

## 2018-02-03 DIAGNOSIS — R630 Anorexia: Secondary | ICD-10-CM | POA: Diagnosis not present

## 2018-02-03 DIAGNOSIS — R5383 Other fatigue: Secondary | ICD-10-CM | POA: Diagnosis not present

## 2018-02-03 DIAGNOSIS — R110 Nausea: Secondary | ICD-10-CM

## 2018-02-03 DIAGNOSIS — Z8042 Family history of malignant neoplasm of prostate: Secondary | ICD-10-CM | POA: Diagnosis not present

## 2018-02-03 DIAGNOSIS — C50911 Malignant neoplasm of unspecified site of right female breast: Secondary | ICD-10-CM

## 2018-02-03 DIAGNOSIS — Z170 Estrogen receptor positive status [ER+]: Secondary | ICD-10-CM | POA: Diagnosis not present

## 2018-02-03 DIAGNOSIS — R1310 Dysphagia, unspecified: Secondary | ICD-10-CM | POA: Insufficient documentation

## 2018-02-03 DIAGNOSIS — Z87891 Personal history of nicotine dependence: Secondary | ICD-10-CM

## 2018-02-03 DIAGNOSIS — Z853 Personal history of malignant neoplasm of breast: Secondary | ICD-10-CM | POA: Insufficient documentation

## 2018-02-03 DIAGNOSIS — C159 Malignant neoplasm of esophagus, unspecified: Secondary | ICD-10-CM | POA: Diagnosis not present

## 2018-02-03 DIAGNOSIS — Z90710 Acquired absence of both cervix and uterus: Secondary | ICD-10-CM

## 2018-02-03 DIAGNOSIS — Z807 Family history of other malignant neoplasms of lymphoid, hematopoietic and related tissues: Secondary | ICD-10-CM

## 2018-02-03 DIAGNOSIS — F419 Anxiety disorder, unspecified: Secondary | ICD-10-CM | POA: Insufficient documentation

## 2018-02-03 DIAGNOSIS — Z510 Encounter for antineoplastic radiation therapy: Secondary | ICD-10-CM | POA: Diagnosis not present

## 2018-02-03 NOTE — Op Note (Signed)
Stereotactic Radiosurgery Operative Note  Name: Kristin Williams MRN: [id number]  Date: 02/03/2018  DOB: Jun 01, 1945  Op Note  Pre Operative Diagnosis:  Cerebellar brain metastasis  Post Operative Diagnois:  Cerebellar brain metastasis  3D TREATMENT PLANNING AND DOSIMETRY:  The patient's radiation plan was reviewed and approved by myself (neurosurgery) and Dr. Eppie Gibson  (radiation oncology) prior to treatment.  It showed 3-dimensional radiation distributions overlaid onto the planning CT/MRI image set.  The [facility] for the target structures as well as the organs at risk were reviewed. The documentation of the 3D plan and dosimetry are filed in the radiation oncology EMR.  NARRATIVE:  Kristin Williams was brought to the TrueBeam stereotactic radiation treatment machine and placed supine on the CT couch. The head frame was applied, and the patient was set up for stereotactic radiosurgery.  I was present for the set-up and delivery.  SIMULATION VERIFICATION:  In the couch zero-angle position, the patient underwent Exactrac imaging using the Brainlab system with orthogonal KV images.  These were carefully aligned and repeated to confirm treatment position for each of the isocenters.  The Exactrac snap film verification was repeated at each couch angle.  SPECIAL TREATMENT PROCEDURE: Kristin Williams received the first of 3 hypofractionated stereotactic radiosurgery doses to the following target: 30 mm in diameter cerebellar target was treated using 4 Rapid Arc VMAT Beams to a prescription dose for her first of three fractions of 9 Gy, of a planned total dose of 27 Gy.  ExacTrac registration was performed for each couch angle.  The 100% isodose line was prescribed.  STEREOTACTIC TREATMENT MANAGEMENT:  Following delivery, the patient was transported to nursing in stable condition and monitored for possible acute effects.  Vital signs were recorded. The patient tolerated treatment without significant acute  effects, and was discharged to home in stable condition.    PLAN: Follow-up in one month.

## 2018-02-03 NOTE — Progress Notes (Signed)
START OFF PATHWAY REGIMEN - Gastroesophageal   [id number]:Carboplatin + Paclitaxel (2/50) + RT weekly x 6 weeks:   Administer weekly during RT:     Paclitaxel      Carboplatin   **Always confirm dose/schedule in your pharmacy ordering system**  Patient Characteristics: Distant Metastases (cM1/pM1) / Locally Recurrent Disease, Adenocarcinoma - Esophageal, GE Junction, and Gastric, First Line, HER2 Negative / Unknown Histology: Adenocarcinoma Disease Classification: Esophageal Therapeutic Status: Distant Metastases (No Additional Staging) Line of Therapy: First Line HER2 Status: Awaiting Test Results Intent of Therapy: Non-Curative / Palliative Intent, Discussed with Patient

## 2018-02-03 NOTE — Progress Notes (Signed)
[place] [facility]  Clinical Social Work was referred by patient  to file healthcare advance directives; documents completed inpatient and in community.  Clinical Social Worker met with patient in [place].  The patient designated Marcedes Tech as their primary healthcare agent and Bronwen Betters as their secondary agent.  Patient also completed healthcare living will.     Clinical Social Worker will send documents to medical records to be scanned into patient's chart. Clinical Social Worker encouraged patient/family to contact with any additional questions or concerns.  Edwyna Shell, LCSW Clinical Social Worker Phone:  [phone]

## 2018-02-03 NOTE — Progress Notes (Signed)
[place]  [phone] [phone]  Clinic New Consult Note   Patient Care Team: Lujean Amel, MD as PCP - General (Family Medicine)   Date of Service:  02/03/2018   REFERRAL PHYSICIAN: Dr. 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    01/30/2018 Initial Diagnosis    Metastasis to brain [facility])    01/31/2018 Imaging    MRI Brain W WO Contrast 01/31/18  IMPRESSION: 3 Tesla study does not disclose any additional lesions. Solitary metastasis in the right superior cerebellum measuring 2.9 x 2.6 x 3.0 cm with mild surrounding edema. 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    02/03/2018 -  Radiation Therapy    SBRT with Dr. Isidore Moos on 02/03/18, 02/06/18, 02/08/18     Esophageal cancer, stage IV ([place])   01/08/2018 Imaging    CT AP W Constrast 01/08/18  IMPRESSION: 1. Nonobstructed, nondistended bowel. No acute inflammatory process. 2. Moderate-sized hiatal hernia noted. 3. Low-density 1.7 cm left adrenal nodule likely to represent a small adenoma. Lower pole left renal cyst measuring 1.8 cm. 4. Moderate aortic atherosclerosis. 5. Lumbar spondylosis and facet arthropathy.    01/27/2018 Imaging    CT Chest W Contrast 01/27/18  IMPRESSION: 1. Large mass involving the distal half of the esophagus spanning approximately 11.7 cm is  identified compatible with primary esophageal neoplasm. 2. Small posterior mediastinal and high right paratracheal lymph nodes are noted which may represent foci of metastatic adenopathy. 3. The small pulmonary nodules in the right lung are unchanged from 01/15/2016 and are favored to represent a benign process. 4. Aortic Atherosclerosis (ICD10-I70.0) and Emphysema (ICD10-J43.9). 5. Coronary artery atherosclerotic calcifications.    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 Ultrasound-guided biopsy is recommended.    01/10/2018 Initial Biopsy    Diagnsis from Breast Biopsy 01/10/18  Breast, right, needle core biopsy, 6 o'clock - INVASIVE DUCTAL CARCINOMA WITH CALCIFICATIONS, SEE COMMENT. - DUCTAL CARCINOMA IN SITU. 1 of 2 FINAL for Kristin, Williams [phone]) Microscopic Comment The carcinoma appears grade 2. Prognostic markers will be ordered. Dr. [name] has reviewed the case. The case was called to [place] on 01/11/2018.    01/10/2018 Receptors her2    The tumor cells are NEGATIVE for Her2 (1+). Estrogen Receptor: 100%, POSITIVE, STRONG STAINING INTENSITY Progesterone Receptor: 100%, POSITIVE, STRONG STAINING INTENSITY Proliferation Marker Ki67: 15%    01/10/2018 Cancer Staging    Staging form: Breast, AJCC 8th Edition - Clinical stage from 01/10/2018: Stage IA (cT1b, cN0, cM0, G2, ER+, PR+, HER2-) - Signed by [name], MD  on 02/03/2018    01/30/2018 Initial Diagnosis    Breast cancer (HCC)      HISTORY OF PRESENTING ILLNESS:  Kristin Williams 72 y.o. female is a here because of newly diagnosed right breast  and esophageal cancer, with brain metastasis. The patient was referred by Dr. Jana Hakim. The patient presents to the clinic today accompanied by her whole family.   Her new right breast cancer was found by screening mammogram. She did not feel the mass herself. She had a ER/PR negative left breast cancer 14 years ago and was treated with lumpectomy, chemo and radiatio. She was treated by Dr. Jana Hakim.  Her recent mammogram and ultrasound showed 6 mm mass at the 6:00 right breast, biopsy confirmed ductal carcinoma, ER PR positive, HER-2 negative.  She notes in the past 8 weeks having dysphagia but able to keep soft/liquid food down. She has history of vertigo, and noticed worsening and persistent dizziness in the past months, associated with nausea and vomiting. She was referred to GI for further workup. She saw Dr. Penelope Coop and underwent EGD which showed a mass in the mid to distal esophagus, biopsy showed adenocarcinoma.  CT scan including CT had were done, which unfortunately showed a mass in the cerebellum.  She was referred to emergency room, and admitted to hospital last week.  She was started on dexamethasone, currently on 4 mg twice daily, her vertigo has much improved.  Was seen by radiation oncologist Dr. Isidore Moos, and is scheduled to start SBRT to her brain metastasis today.   Today she notes she is now eating less due to low appetite. She notes fatigue as well. She is on steroids currently and her nausea has improved. She is currently eating a soft food diet, like soup with some meat and noodles. She refuses ensure/boosts or carnation breakfast due to milk texture. She notes she has only had 1 headache but has trouble with her balance so she has someone walk with her and someone is there to help the shower.   Socially she is married with children and grandchildren. She is retired and lives in [place]. She stopped smoking this week after 50 years, 1.5 ppd. She has family close by and great support.    They have a PMHx of arthritis, COPD with CPAP and inhaler which she uses as needed. Her mother had Lymphoma at 68, her maternal uncle had lung cancer. She had a brother with prostate cancer. She had surgical history of hysterectomy and lumpectomy. She had a right iliac stent and ganglion cyst removals and knee surgery.  She is on diflucan for thrush secondary to antibiotics. She is on Ativan for anxiety.    REVIEW OF SYSTEMS:   Constitutional: Denies fevers, chills or abnormal night sweats (+) low appetite (+) Fatigue  Eyes: Denies blurriness of vision, double vision or watery eyes Ears, nose, mouth, throat, and face: Denies mucositis or sore throat (+) mild dysphagia  Respiratory: Denies cough, dyspnea or wheezes Cardiovascular: Denies palpitation, chest discomfort or lower extremity swelling Gastrointestinal:  Denies heartburn or change in bowel habits (+) nausea Skin: Denies abnormal skin rashes Lymphatics: Denies new lymphadenopathy or easy bruising Neurological: Denies numbness, tingling or new weaknesses (+) balance deficit, trouble ambulating  Behavioral/Psych: Mood is stable, no new changes  All other systems were reviewed with the patient and are negative.    MEDICAL HISTORY:  Past Medical History:  Diagnosis Date  . Arthritis   . Cancer (HCC)    Cervical  . Cancer (  [place])    Breast  . Cancer ([place])    Vaginal  . Complication of anesthesia   . COPD (chronic obstructive pulmonary disease) ([place])   . Headache(784.0)   . Hyperlipidemia   . Leg pain   . Peripheral vascular disease ([place])   . Personal history of chemotherapy   . Personal history of radiation therapy   . PONV (postoperative nausea and vomiting)     SURGICAL HISTORY: Past Surgical History:  Procedure Laterality Date  . ABDOMINAL HYSTERECTOMY  1975  . BREAST LUMPECTOMY  2005  . GANGLION CYST EXCISION  1977  . ILIAC ARTERY STENT  04/20/2004   CSD right iliac occlusive disease    SOCIAL  HISTORY: Social History   Socioeconomic History  . Marital status: Married    Spouse name: Not on file  . Number of children: 2  . Years of education: Not on file  . Highest education level: Not on file  Occupational History  . Not on file  Social Needs  . Financial resource strain: Not on file  . Food insecurity:    Worry: Not on file    Inability: Not on file  . Transportation needs:    Medical: Not on file    Non-medical: Not on file  Tobacco Use  . Smoking status: Former Smoker    Packs/day: 0.50    Years: 40.00    Pack years: 20.00    Types: Cigarettes    Last attempt to quit: 01/27/2018    Years since quitting: 0.0  . Smokeless tobacco: Never Used  Substance and Sexual Activity  . Alcohol use: No  . Drug use: No  . Sexual activity: Not Currently  Lifestyle  . Physical activity:    Days per week: Not on file    Minutes per session: Not on file  . Stress: Not on file  Relationships  . Social connections:    Talks on phone: Not on file    Gets together: Not on file    Attends religious service: Not on file    Active member of club or organization: Not on file    Attends meetings of clubs or organizations: Not on file    Relationship status: Not on file  . Intimate partner violence:    Fear of current or ex partner: Not on file    Emotionally abused: Not on file    Physically abused: Not on file    Forced sexual activity: Not on file  Other Topics Concern  . Not on file  Social History Narrative   Resides in [place]. Resides with her husband.     FAMILY HISTORY: Family History  Problem Relation Age of Onset  . Cancer Mother 88       Non-Hodgkins Disease T-Cell  . Heart failure Father   . COPD Father   . Heart disease Brother 61       MI and Heart Disease before age 54  . Heart attack Brother   . Lung cancer Maternal Uncle   . Cancer Cousin   . Prostate cancer Maternal Uncle   . Prostate cancer Brother        half brother  . Breast cancer Neg  Hx     ALLERGIES:  is allergic to flonase [fluticasone propionate].  MEDICATIONS:  Current Outpatient Medications  Medication Sig Dispense Refill  . dexamethasone (DECADRON) 4 MG tablet Take 1 tablet (4 mg total) by mouth every 12 (twelve) hours. 60 tablet 2  . fluconazole (  DIFLUCAN) 100 MG tablet Take 2 tablets today, then 1 tablet daily x 20 more days. 22 tablet 0  . LORazepam (ATIVAN) 1 MG tablet Take 1 tablet (1 mg total) by mouth 3 (three) times daily as needed for anxiety. 15 tablet 0  . metoCLOPramide (REGLAN) 10 MG tablet Take 1 tablet (10 mg total) by mouth every 6 (six) hours as needed for nausea. 60 tablet 2  . omeprazole (PRILOSEC) 20 MG capsule Take 40 mg by mouth daily.     . ondansetron (ZOFRAN) 8 MG tablet Take 8 mg by mouth every 6 (six) hours as needed for nausea or vomiting.   0  . prochlorperazine (COMPAZINE) 10 MG tablet Take 1 tablet (10 mg total) by mouth every 6 (six) hours as needed for nausea or vomiting. 60 tablet 1   No current facility-administered medications for this visit.      PHYSICAL EXAMINATION: ECOG PERFORMANCE STATUS: 2 - Symptomatic, <50% confined to bed  Vitals:   02/03/18 1452  BP: (!) 149/77  Pulse: 70  Resp: 18  Temp: 97.6 F (36.4 C)  SpO2: 98%   Filed Weights   02/03/18 1452  Weight: 212 lb (96.2 kg)    GENERAL:alert, no distress and comfortable SKIN: skin color, texture, turgor are normal, no rashes or significant lesions EYES: normal, conjunctiva are pink and non-injected, sclera clear OROPHARYNX:no exudate, no erythema and lips, buccal mucosa, and tongue normal  NECK: supple, thyroid normal size, non-tender, without nodularity LYMPH:  no palpable lymphadenopathy in the cervical, axillary or inguinal LUNGS: clear to auscultation and percussion with normal breathing effort HEART: regular rate & rhythm and no murmurs and no lower extremity edema ABDOMEN:abdomen soft, non-tender and normal bowel sounds Musculoskeletal:no  cyanosis of digits and no clubbing  PSYCH: alert & oriented x 3 with fluent speech NEURO: no focal motor/sensory deficits (+) adequate coordination (+) deficit in balance, ambulates with assistance BREAST: (+) S/p left breast lumpectomy: surgical incision healed well, left breast smaller than right, 5cm seroma in UIQ of left breast (+) Her right breast mass was not palpable   LABORATORY DATA:  I have reviewed the data as listed CBC Latest Ref Rng & Units 01/30/2018 01/29/2018 01/28/2018  WBC 4.0 - 10.5 K/uL 9.8 11.8(H) 6.1  Hemoglobin 12.0 - 15.0 g/dL 12.3 12.7 13.5  Hematocrit 36.0 - 46.0 % 38.4 38.9 42.5  Platelets 150 - 400 K/uL 263 308 329    CMP Latest Ref Rng & Units 01/30/2018 01/29/2018 01/28/2018  Glucose 70 - 99 mg/dL 135(H) 138(H) 159(H)  BUN 8 - 23 mg/dL '18 14 8  ' Creatinine 0.44 - 1.00 mg/dL 0.97 1.03(H) 1.03(H)  Sodium 135 - 145 mmol/L 136 137 136  Potassium 3.5 - 5.1 mmol/L 4.4 4.1 4.9  Chloride 98 - 111 mmol/L 107 104 102  CO2 22 - 32 mmol/L '24 23 28  ' Calcium 8.9 - 10.3 mg/dL 8.9 9.7 9.9  Total Protein 6.5 - 8.1 g/dL - - -  Total Bilirubin 0.3 - 1.2 mg/dL - - -  Alkaline Phos 38 - 126 U/L - - -  AST 15 - 41 U/L - - -  ALT 0 - 44 U/L - - -     RADIOGRAPHIC STUDIES: I have personally reviewed the radiological images as listed and agreed with the findings in the report. Ct Head W & Wo Contrast  Addendum Date: 01/27/2018   ADDENDUM REPORT: 01/27/2018 01:36 ADDENDUM: Critical Value/emergent results were called by telephone at the time of interpretation on  01/27/2018 at 1:30 am to Dr. Anson Fret , who verbally acknowledged these results. Electronically Signed   By: Elon Alas M.D.   On: 01/27/2018 01:36   Result Date: 01/27/2018 CLINICAL DATA:  Vertigo and dizziness for 2 months. History of breast cancer and esophageal mass. EXAM: CT HEAD WITHOUT AND WITH CONTRAST TECHNIQUE: Contiguous axial images were obtained from the base of the skull through the vertex  without and with intravenous contrast CONTRAST:  69m ISOVUE-300 IOPAMIDOL (ISOVUE-300) INJECTION 61% COMPARISON:  CT HEAD December 06, 2003 FINDINGS: BRAIN: 2.6 x 2.6 x 3 cm (volume = 10 cc) RIGHT parasagittal cerebellar hemorrhage with surrounding low-density vasogenic edema. Mild mass effect on fourth ventricle without hydrocephalus. No parenchymal brain volume loss for age. No abnormal intracranial enhancement. LEFT cerebellar developmental venous anomaly. No acute large vascular territory infarct. No abnormal extra-axial fluid collections. Basal cisterns are patent. VASCULAR: Unremarkable. SKULL: No skull fracture. No destructive bony lesions. 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 No significant scalp soft tissue swelling. SINUSES/ORBITS: Trace paranasal sinus mucosal thickening. Mastoid air cells are well aerated.The included ocular globes and orbital contents are non-suspicious. OTHER: None. IMPRESSION: 1. Acute 10 cc cerebellar hemorrhage with mild mass effect on fourth ventricle, no hydrocephalus. 2. Otherwise negative CT HEAD with and without contrast. 3. Ordering clinician has been paged at time of study interpretation, awaiting return call. Electronically Signed: By: CElon AlasM.D. On: 01/27/2018 01:24   Ct Chest W Contrast  Result Date: 01/27/2018 CLINICAL DATA:  Tumor of the esophagus. EXAM: CT CHEST WITH CONTRAST TECHNIQUE: Multidetector CT imaging of the chest was performed during intravenous contrast administration. CONTRAST:  714mISOVUE-300 IOPAMIDOL (ISOVUE-300) INJECTION 61% COMPARISON:  01/19/2017 FINDINGS: Cardiovascular: The heart size appears normal. Aortic atherosclerosis. Calcifications within the LAD, left circumflex and RCA coronary arteries noted. No pericardial effusion. Mediastinum/Nodes: Normal appearance of the thyroid gland. The trachea appears patent and is midline. There is masslike thickening involving the distal half of the esophagus. The  tumor spans approximately 11.7 cm beginning at the T5 level and extending to just above a small hiatal hernia. The single wall thickness of the esophagus measures up to 1.5 cm. No axillary or supraclavicular adenopathy. High right paratracheal lymph node is new from 01/15/2016 measuring 8 mm. Lymph node between the esophagus and descending thoracic aorta measures 1.2 cm, image 114/2. Lungs/Pleura: No pleural effusion. Mild changes of emphysema. Right lower lobe lung nodule measures 5 mm, image 101/5. Also stable from 01/15/2016. No new or enlarging pulmonary nodules. 1.3 cm right middle lobe lung nodule is identified, image 93/5. Unchanged from 01/15/2016. Upper Abdomen: No acute abnormality identified. Small hiatal hernia. Unchanged left adrenal nodules. Musculoskeletal: No chest wall abnormality. No acute or significant osseous findings. Post treatment changes within the medial left breast noted. IMPRESSION: 1. Large mass involving the distal half of the esophagus spanning approximately 11.7 cm is identified compatible with primary esophageal neoplasm. 2. Small posterior mediastinal and high right paratracheal lymph nodes are noted which may represent foci of metastatic adenopathy. 3. The small pulmonary nodules in the right lung are unchanged from 01/15/2016 and are favored to represent a benign process. 4. Aortic Atherosclerosis (ICD10-I70.0) and Emphysema (ICD10-J43.9). 5. Coronary artery atherosclerotic calcifications. Electronically Signed   By: TaKerby Moors.D.   On: 01/27/2018 11:23   Mr BrJeri CosoOJontrast  Result Date: 01/31/2018 CLINICAL DATA:  Vertigo and vomiting over the last 6 weeks. Weight loss. Esophageal cancer. Cerebellar metastasis. EXAM: MRI HEAD WITHOUT AND  WITH CONTRAST TECHNIQUE: Multiplanar, multiecho pulse sequences of the brain and surrounding structures were obtained without and with intravenous contrast. CONTRAST:  10 cc Gadavist COMPARISON:  01/27/2018 FINDINGS: Brain: No  additional lesions are disclosed. There is a 2.9 x 2.6 x 3.0 cm metastasis in the right superior cerebellum with somewhat lobular contours and mild surrounding vasogenic edema. The lesion is uniformly hyperdense at CT in shows fairly uniform low T2 signal on MRI, favoring psammomatous calcification rather than hemorrhage. There are probably some minor petechial hemorrhagic components. Elsewhere, there are mild to moderate chronic small-vessel ischemic changes of the cerebral hemispheric white matter. No hydrocephalus or extra-axial collection. Vascular: Major vessels at the base of the brain show flow. Skull and upper cervical spine: Negative Sinuses/Orbits: Clear/normal Other: None IMPRESSION: 3 Tesla study does not disclose any additional lesions. Solitary metastasis in the right superior cerebellum measuring 2.9 x 2.6 x 3.0 cm with mild surrounding edema. 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 Electronically Signed   By: Nelson Chimes M.D.   On: 01/31/2018 06:33   Mr Jeri Cos VO Contrast  Result Date: 01/27/2018 CLINICAL DATA:  Vertigo for 2 weeks. Cerebellar hemorrhage on CT. Esophageal tumor. EXAM: MRI HEAD WITHOUT AND WITH CONTRAST TECHNIQUE: Multiplanar, multiecho pulse sequences of the brain and surrounding structures were obtained without and with intravenous contrast. CONTRAST:  9.5 mL Gadavist COMPARISON:  Head CT 01/26/2018 FINDINGS: Brain: A right parasagittal cerebellar hemorrhage has not significantly changed in size from yesterday CT measuring 2.9 x 2.7 x 2.5 cm. The hemorrhage is heterogeneously T1 hyperintense and T2 hypointense, and there is associated enhancement including nodular foci of internal enhancement. There is mild surrounding vasogenic edema with partial effacement of the fourth ventricle but no obstructive hydrocephalus. No other enhancing intracranial lesions are identified. There is no evidence of acute infarct or extra-axial fluid collection. Cerebral volume is normal for age.  Scattered small foci of T2 hyperintensity in the cerebral white matter nonspecific but compatible with minimal chronic small vessel ischemic disease. Vascular: Major intracranial vascular flow voids are preserved. Skull and upper cervical spine: Unremarkable bone marrow signal. Sinuses/Orbits: Bilateral cataract extraction. Opacification of a single left ethmoid air cell. Clear mastoid air cells. Other: None. IMPRESSION: 2.9 cm hemorrhagic cerebellar mass most consistent with solitary brain metastasis. 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 Electronically Signed   By: Logan Bores M.D.   On: 01/27/2018 14:27   US Abdomen Complete  Result Date: 01/25/2018 CLINICAL DATA:  Abdominal pain with nausea and vomiting EXAM: ABDOMEN ULTRASOUND COMPLETE COMPARISON:  None. FINDINGS: Gallbladder: No gallstones or wall thickening visualized. There is no pericholecystic fluid. No sonographic Murphy sign noted by sonographer. Common bile duct: Diameter: 4 mm. No intrahepatic, common hepatic, or common bile duct dilatation. Liver: No focal lesion identified. Within normal limits in parenchymal echogenicity. Portal vein is patent on color Doppler imaging with normal direction of blood flow towards the liver. IVC: No abnormality visualized. Pancreas: Visualized portion unremarkable. Portions of pancreatic tail obscured by gas. Spleen: Size and appearance within normal limits. Right Kidney: Length: 11.5 cm. Echogenicity within normal limits. No mass or hydronephrosis visualized. Left Kidney: Length: 11.2 cm. Echogenicity within normal limits. No hydronephrosis visualized. There is a cyst arising from the lower pole left kidney measuring 1.8 x 1.5 x 1.6 cm. Abdominal aorta: No aneurysm visualized. Other findings: No demonstrable ascites. IMPRESSION: 1. Portions of pancreas obscured by gas. Visualized portions of pancreas appear normal. 2.  Cyst arising from lower pole left kidney. 3.  Study otherwise  unremarkable.  Electronically Signed   By: Lowella Grip III M.D.   On: 01/25/2018 10:22   Ct Abdomen Pelvis W Contrast  Result Date: 01/08/2018 CLINICAL DATA:  Patient went etiology is started having abdominal pain and diarrhea with dizziness. Patient had been dealing with vertigo over the past week. EXAM: CT ABDOMEN AND PELVIS WITH CONTRAST TECHNIQUE: Multidetector CT imaging of the abdomen and pelvis was performed using the standard protocol following bolus administration of intravenous contrast. CONTRAST:  140m ISOVUE-300 IOPAMIDOL (ISOVUE-300) INJECTION 61% COMPARISON:  None FINDINGS: Lower chest: Moderate sized hiatal hernia. Normal size heart. No active pulmonary disease. Hepatobiliary: No focal liver abnormality is seen. No gallstones, gallbladder wall thickening, or biliary dilatation. Pancreas: Unremarkable. No pancreatic ductal dilatation or surrounding inflammatory changes. Spleen: Normal in size without focal abnormality. Adrenals/Urinary Tract: Normal right adrenal gland. 1.7 cm nodule in the anterior limb of the left adrenal gland likely to represent a small adenoma. Water attenuating cyst off the lower pole of the left kidney measuring up to 1.8 cm. No nephrolithiasis nor hydroureteronephrosis. Tiny focus of air along the nondependent portion of the urinary bladder possibly secondary to intervention. Stomach/Bowel: The stomach, duodenum, ligament Treitz, small intestine and colon are unremarkable. No bowel obstruction or inflammation. Normal appearing appendix. Vascular/Lymphatic: Moderate aortoiliac atherosclerosis. No adenopathy. Reproductive: Hysterectomy.  No adnexal mass. Other: No abdominal wall hernia or abnormality. No abdominopelvic ascites. Musculoskeletal: Mild grade 1 anterolisthesis of L3 on L4 and L4 on L5 associated with degenerative disc and facet arthropathy. Multilevel degenerative facet arthropathy is seen along the entirety of the lumbar spine. Degenerative disc disease also noted L1-2  with vacuum disc phenomenon. No aggressive osseous lesions or fracture. IMPRESSION: 1. Nonobstructed, nondistended bowel. No acute inflammatory process. 2. Moderate-sized hiatal hernia noted. 3. Low-density 1.7 cm left adrenal nodule likely to represent a small adenoma. Lower pole left renal cyst measuring 1.8 cm. 4. Moderate aortic atherosclerosis. 5. Lumbar spondylosis and facet arthropathy. Electronically Signed   By: DAshley RoyaltyM.D.   On: 01/08/2018 17:24   Mm Clip Placement Right  Result Date: 01/10/2018 CLINICAL DATA:  Evaluate biopsy clip EXAM: DIAGNOSTIC RIGHT MAMMOGRAM POST ULTRASOUND BIOPSY COMPARISON:  Previous exam(s). FINDINGS: Mammographic images were obtained following ultrasound guided biopsy of a right breast mass. The ribbon shaped biopsy clip is within the biopsied mass. IMPRESSION: The ribbon shaped clip is within the biopsied mass. Final Assessment: Post Procedure Mammograms for Marker Placement Electronically Signed   By: DDorise BullionIII M.D   On: 01/10/2018 16:35   [place] Breast Bx W Loc Dev 1st Lesion Img Bx Spec [place]Guide  Addendum Date: 01/13/2018   ADDENDUM REPORT: 01/11/2018 14:15 ADDENDUM: Pathology revealed GRADE II INVASIVE DUCTAL CARCINOMA WITH CALCIFICATIONS, DUCTAL CARCINOMA IN SITU of the Right breast, 6 o'clock. This was found to be concordant by Dr. DDorise Bullion Pathology results were discussed with the patient by telephone. The patient reported doing well after the biopsy with tenderness at the site. Post biopsy instructions and care were reviewed and questions were answered. [facility] [place]. [facility] has [facility]olm Bookbinder [facility] Pathology results reported by LTerie Purser RN on 01/11/2018. Electronically Signed   By: DDorise BullionIII M.D   On: 01/11/2018 14:15   Result  Date: 01/13/2018 CLINICAL DATA:  Right breast mass EXAM: ULTRASOUND GUIDED RIGHT BREAST CORE  NEEDLE BIOPSY COMPARISON:  Previous exam(s). FINDINGS: I met with the patient and we discussed the procedure of ultrasound-guided biopsy, including benefits and alternatives. We discussed the high likelihood of a successful procedure. We discussed the risks of the procedure, including infection, bleeding, tissue injury, clip migration, and inadequate sampling. Informed written consent was given. The usual time-out protocol was performed immediately prior to the procedure. Lesion quadrant: Lower-outer Using sterile technique and 1% Lidocaine as local anesthetic, under direct ultrasound visualization, a 12 gauge spring-loaded device was used to perform biopsy of the right breast mass using a lateral approach. At the conclusion of the procedure a tissue marker clip was deployed into the biopsy cavity. Follow up 2 view mammogram was performed and dictated separately. IMPRESSION: Ultrasound guided biopsy of the right breast mass. No apparent complications. Electronically Signed: By: Dorise Bullion III M.D On: 01/10/2018 16:19    ASSESSMENT & PLAN:  AKIMA SLAUGH is a 72 y.o. female with H/o left breast cancer, Arthritis, COPD, HLD and Vertigo.    1. 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  -Giving her much more aggressive nature of esophageal cancer than breast cancer, her brain metastasis is likely from esophageal cancer -I discussed her cancer is stage IV at this point and not curable but still very treatable.  -Her CT scan showed no other distant metastasis. Will get PET scan to rule out other distal metastasis. If negative, I think it's reasonable to consider concurrent chemoRT for her esophageal cancer for better disease control, giving her oligo brain metastasis  will be treated with SBRT. -The metastatic disease, will would not offer surgical resection of her esophageal cancer.  She was previously seen by neurosurgeon, and brain surgery was not offered. -I offered her weekly carboplatin and Taxol with concurrent radiation, after she completes brain SBRT --Chemotherapy consent: Side effects including but does not not limited to, fatigue, nausea, vomiting, diarrhea, hair loss, neuropathy, fluid retention, renal and kidney dysfunction, neutropenic fever, needed for blood transfusion, bleeding, were discussed with patient in great detail. She agrees to proceed. -the goal of therapy is palliative  -I will test HER2, MMR, and PD-L1 on her biopsy sample to see if she is eligible for immunotherapy and targeted therapy   -F/u in 2 weeks   2. Right Breast Invasive Ductal Carcinoma, [id number] stage IA, Grade II, ER/PR Positive, HER 2 negative -This was discovered by screening mammogram.   -Given her early stage breast cancer, I discussed her breast cancer treatment is less urgent than esophogeal cancer treatment.  -I plan to start her on anti-estrogen therapy after she completes chemoradiation for esophageal cancer, given her ER/PR positive cancer. If she does very well in 6 to 12 months without esophageal cancer progression, we may consider breast surgery.   3. Oligo brain mets -I discussed this is likely from her primary esophogeal cancer.  -She does have balance issues and ambulates with assistance. I discussed she is at risk for fall and that she should be careful.  -She will proceed with SBRT starting today (02/03/18)  4. H/o Left breast cancer, ER/PR negative, treated by Dr. Jana Hakim with lumpectomy, adj, chemo and radiation.  -will send her to genetics    5. Dysphagia, nausea, low appetite   -Pain is mild, mainly with eating, she is on soft foods diet -I strongly encouraged her  to take ensure clear supplements, to maintain weight.  -I suggest liquid  Tylenol before eating.  -Currently on Zofran, Compazine, Reglan as needed and Dexamethasone BID for Nausea, Nausea improved. -dietician consult    6. Goal of care discussion  -We again discussed the incurable nature of her cancer, and the overall poor prognosis, especially if she does not have good response to chemotherapy or progress on chemo -The patient understands the goal of care is palliative. -she is full code now    PLAN:  -Start SBRT for brian met today  -PET scan in 1-2 weeks -Lab and f/u in 2 weeks, plan to start chemoRT for her esophageal cancer in 2-3 weeks if no other distant mets on PET     No orders of the defined types were placed in this encounter.   All questions were answered. The patient knows to call the clinic with any problems, questions or concerns. I spent 40 minutes counseling the patient face to face. The total time spent in the appointment was 60 minutes and more than 50% was on counseling.     [name], MD 02/03/2018   I, Joslyn Devon, am acting as scribe for [name], MD.   I have reviewed the above documentation for accuracy and completeness, and I agree with the above.

## 2018-02-06 ENCOUNTER — Telehealth: Payer: Self-pay | Admitting: Hematology

## 2018-02-06 ENCOUNTER — Ambulatory Visit
Admission: RE | Admit: 2018-02-06 | Discharge: 2018-02-06 | Disposition: A | Discharge status: Home / Self Care | Payer: Medicare Other | Source: Ambulatory Visit | Attending: Radiation Oncology | Admitting: Radiation Oncology

## 2018-02-06 VITALS — BP 148/92 | HR 57 | Temp 98.2°F | Resp 20

## 2018-02-06 DIAGNOSIS — C7931 Secondary malignant neoplasm of brain: Secondary | ICD-10-CM | POA: Diagnosis not present

## 2018-02-06 DIAGNOSIS — Z853 Personal history of malignant neoplasm of breast: Secondary | ICD-10-CM | POA: Diagnosis not present

## 2018-02-06 DIAGNOSIS — C159 Malignant neoplasm of esophagus, unspecified: Secondary | ICD-10-CM | POA: Diagnosis not present

## 2018-02-06 DIAGNOSIS — Z510 Encounter for antineoplastic radiation therapy: Secondary | ICD-10-CM | POA: Diagnosis not present

## 2018-02-06 NOTE — Progress Notes (Signed)
  Radiation Oncology         [phone] ________________________________  Stereotactic Treatment Procedure Note   ICD-10-CM   1. 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  It showed 3-dimensional radiation distributions overlaid onto the planning CT/MRI image set.  The [facility] for the target structures as well as the organs at risk were reviewed. The documentation of the 3D plan and dosimetry are filed in the radiation oncology EMR.  NARRATIVE:  Kristin Williams was brought to the TrueBeam stereotactic radiation treatment machine and placed supine on the CT couch. The head frame was applied, and the patient was set up for stereotactic radiosurgery.  Neurosurgery was present for the set-up and delivery  SIMULATION VERIFICATION:  In the couch zero-angle position, the patient underwent Exactrac imaging using the Brainlab system with orthogonal KV images.  These were carefully aligned and repeated to confirm treatment position for each of the isocenters.  The Exactrac snap film verification was repeated at each couch angle.  SPECIAL TREATMENT PROCEDURE: Kristin Williams received stereotactic radiosurgery to the following targets: Right superior cerebellar 53mm target was treated using 4 Rapid Arc VMAT Beams to a prescription dose of 7 Gy (1st of 3 fractions).  ExacTrac Snap verification was performed for each couch angle.   This constitutes a special treatment procedure due to the ablative dose delivered and the technical nature of treatment.  This highly technical modality of treatment ensures that the ablative dose is centered on the patient's tumor while sparing normal tissues from excessive dose and risk of detrimental  effects.  STEREOTACTIC TREATMENT MANAGEMENT:  Following delivery, the patient was transported to nursing in stable condition and monitored for possible acute effects.  Vital signs were recorded Vitals with BMI 02/03/2018  Height 5\' 0"   Weight 212 lbs  BMI 94.5  Systolic 038  Diastolic 77  Pulse 70  Respirations 18  The patient tolerated treatment without significant acute effects, and was discharged to home in stable condition.    PLAN: Follow-up on 11-25 for next fraction.  ________________________________   Eppie Gibson, MD

## 2018-02-06 NOTE — Progress Notes (Signed)
  Radiation Oncology         [phone] ________________________________  Stereotactic Treatment Procedure Note   ICD-10-CM   1. 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  It showed 3-dimensional radiation distributions overlaid onto the planning CT/MRI image set.  The [facility] for the target structures as well as the organs at risk were reviewed. The documentation of the 3D plan and dosimetry are filed in the radiation oncology EMR.  NARRATIVE:  JUDITHE KEETCH was brought to the TrueBeam stereotactic radiation treatment machine and placed supine on the CT couch. The head frame was applied, and the patient was set up for stereotactic radiosurgery.  Neurosurgery was present for the set-up and delivery  SIMULATION VERIFICATION:  In the couch zero-angle position, the patient underwent Exactrac imaging using the Brainlab system with orthogonal KV images.  These were carefully aligned and repeated to confirm treatment position for each of the isocenters.  The Exactrac snap film verification was repeated at each couch angle.  SPECIAL TREATMENT PROCEDURE: Alben Deeds received stereotactic radiosurgery to the following targets: Right superior cerebellar 45mm target was treated using 4 Rapid Arc VMAT Beams to a prescription dose of 7 Gy (2nd of 3 fractions).  ExacTrac Snap verification was performed for each couch angle.   This constitutes a special treatment procedure due to the ablative dose delivered and the technical nature of treatment.  This highly technical modality of treatment ensures that the ablative dose is centered on the patient's tumor while sparing normal tissues from excessive dose and risk of detrimental  effects.  STEREOTACTIC TREATMENT MANAGEMENT:  Following delivery, the patient was transported to nursing in stable condition and monitored for possible acute effects.  Vital signs were recorded  Vitals:   02/06/18 1245  BP: (!) 148/92  Pulse: (!) 57  Resp: 20  Temp: 98.2 F (36.8 C)  SpO2: 99%    The patient tolerated treatment without significant acute effects, and was discharged to home in stable condition.    PLAN: Follow-up on 11-27 for next fraction.  ________________________________   Eppie Gibson, MD

## 2018-02-06 NOTE — Telephone Encounter (Signed)
Called regarding 12/5

## 2018-02-07 ENCOUNTER — Telehealth: Payer: Self-pay | Admitting: Hematology

## 2018-02-07 NOTE — Telephone Encounter (Signed)
Called patient to let her know of appointment change, no voice mail picked up, will give a new calendar when patient returns to the cancer center on Wednesday 11/27

## 2018-02-08 ENCOUNTER — Encounter: Payer: Self-pay | Admitting: Hematology

## 2018-02-08 ENCOUNTER — Encounter: Payer: Self-pay | Admitting: Radiation Oncology

## 2018-02-08 ENCOUNTER — Ambulatory Visit
Admission: RE | Admit: 2018-02-08 | Discharge: 2018-02-08 | Disposition: A | Discharge status: Home / Self Care | Payer: Medicare Other | Source: Ambulatory Visit | Attending: Radiation Oncology | Admitting: Radiation Oncology

## 2018-02-08 VITALS — BP 142/62 | HR 57 | Temp 97.9°F | Resp 20

## 2018-02-08 DIAGNOSIS — Z853 Personal history of malignant neoplasm of breast: Secondary | ICD-10-CM | POA: Diagnosis not present

## 2018-02-08 DIAGNOSIS — Z510 Encounter for antineoplastic radiation therapy: Secondary | ICD-10-CM | POA: Diagnosis not present

## 2018-02-08 DIAGNOSIS — C7931 Secondary malignant neoplasm of brain: Secondary | ICD-10-CM | POA: Diagnosis not present

## 2018-02-08 DIAGNOSIS — C159 Malignant neoplasm of esophagus, unspecified: Secondary | ICD-10-CM | POA: Diagnosis not present

## 2018-02-08 NOTE — Progress Notes (Signed)
  Radiation Oncology         [phone] ________________________________  Stereotactic Treatment Procedure Note   ICD-10-CM   1. 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  It showed 3-dimensional radiation distributions overlaid onto the planning CT/MRI image set.  The [facility] for the target structures as well as the organs at risk were reviewed. The documentation of the 3D plan and dosimetry are filed in the radiation oncology EMR.  NARRATIVE:  LIBA HULSEY was brought to the TrueBeam stereotactic radiation treatment machine and placed supine on the CT couch. The head frame was applied, and the patient was set up for stereotactic radiosurgery.  Neurosurgery was present for the set-up and delivery  SIMULATION VERIFICATION:  In the couch zero-angle position, the patient underwent Exactrac imaging using the Brainlab system with orthogonal KV images.  These were carefully aligned and repeated to confirm treatment position for each of the isocenters.  The Exactrac snap film verification was repeated at each couch angle.  SPECIAL TREATMENT PROCEDURE: Alben Deeds received stereotactic radiosurgery to the following targets: Right superior cerebellar 80mm target was treated using 4 Rapid Arc VMAT Beams to a prescription dose of 7 Gy (3rd of 3 fractions).  ExacTrac Snap verification was performed for each couch angle.   This constitutes a special treatment procedure due to the ablative dose delivered and the technical nature of treatment.  This highly technical modality of treatment ensures that the ablative dose is centered on the patient's tumor while sparing normal tissues from excessive dose and risk of detrimental  effects.  STEREOTACTIC TREATMENT MANAGEMENT:  Following delivery, the patient was transported to nursing in stable condition and monitored for possible acute effects.  Vital signs were recorded  Vitals:   02/08/18 1340  BP: (!) 142/62  Pulse: (!) 57  Resp: 20  Temp: 97.9 F (36.6 C)  SpO2: 99%    The patient tolerated treatment without significant acute effects, and was discharged to home in stable condition.    PLAN: Follow-up in 42month. Taper plan given for Decadron. ________________________________   Eppie Gibson, MD

## 2018-02-08 NOTE — Progress Notes (Signed)
Pt has 2 insurances so copay assistance shouldn't be needed.  I emailed Kieara in the radiation department requesting she reach out to pt regarding the [place] to assist w/ personal bills and gas cards.

## 2018-02-08 NOTE — Addendum Note (Signed)
Encounter addended by: Shacoya Burkhammer, Stephani Police, RN on: 02/08/2018 2:11 PM  Actions taken: Visit Navigator Flowsheet section accepted

## 2018-02-13 ENCOUNTER — Ambulatory Visit (HOSPITAL_COMMUNITY)
Admission: RE | Admit: 2018-02-13 | Discharge: 2018-02-13 | Disposition: A | Discharge status: Home / Self Care | Payer: Medicare Other | Source: Ambulatory Visit | Attending: Oncology | Admitting: Oncology

## 2018-02-13 DIAGNOSIS — C159 Malignant neoplasm of esophagus, unspecified: Secondary | ICD-10-CM | POA: Diagnosis not present

## 2018-02-13 LAB — GLUCOSE, CAPILLARY: Glucose-Capillary: 109 mg/dL — ABNORMAL HIGH (ref 70–99)

## 2018-02-13 MED ORDER — FLUDEOXYGLUCOSE F - 18 (FDG) INJECTION
11.1400 | Freq: Once | INTRAVENOUS | Status: AC | PRN
  Administered 2018-02-13: 11.14 via INTRAVENOUS

## 2018-02-13 NOTE — Progress Notes (Signed)
  Oncology Nurse Navigator Documentation  Called and left VM introducing myself and role of GI Navigator. I also left my contact information for questions and concerns. Will meet with patient during next clinic appointment.

## 2018-02-15 ENCOUNTER — Ambulatory Visit (HOSPITAL_COMMUNITY): Payer: Medicare Other

## 2018-02-16 ENCOUNTER — Inpatient Hospital Stay: Payer: Medicare Other | Attending: Hematology

## 2018-02-16 ENCOUNTER — Other Ambulatory Visit: Payer: Self-pay

## 2018-02-16 ENCOUNTER — Inpatient Hospital Stay (HOSPITAL_BASED_OUTPATIENT_CLINIC_OR_DEPARTMENT_OTHER): Payer: Medicare Other | Admitting: Hematology

## 2018-02-16 ENCOUNTER — Other Ambulatory Visit: Payer: Self-pay | Admitting: Hematology

## 2018-02-16 VITALS — BP 146/74 | HR 68 | Temp 97.8°F | Resp 18 | Ht 60.0 in | Wt 209.2 lb

## 2018-02-16 DIAGNOSIS — G629 Polyneuropathy, unspecified: Secondary | ICD-10-CM | POA: Insufficient documentation

## 2018-02-16 DIAGNOSIS — R630 Anorexia: Secondary | ICD-10-CM | POA: Diagnosis not present

## 2018-02-16 DIAGNOSIS — C50911 Malignant neoplasm of unspecified site of right female breast: Secondary | ICD-10-CM | POA: Insufficient documentation

## 2018-02-16 DIAGNOSIS — C7931 Secondary malignant neoplasm of brain: Secondary | ICD-10-CM

## 2018-02-16 DIAGNOSIS — I82432 Acute embolism and thrombosis of left popliteal vein: Secondary | ICD-10-CM | POA: Diagnosis not present

## 2018-02-16 DIAGNOSIS — Z5111 Encounter for antineoplastic chemotherapy: Secondary | ICD-10-CM | POA: Insufficient documentation

## 2018-02-16 DIAGNOSIS — Z170 Estrogen receptor positive status [ER+]: Secondary | ICD-10-CM | POA: Diagnosis not present

## 2018-02-16 DIAGNOSIS — Z853 Personal history of malignant neoplasm of breast: Secondary | ICD-10-CM | POA: Diagnosis not present

## 2018-02-16 DIAGNOSIS — C159 Malignant neoplasm of esophagus, unspecified: Secondary | ICD-10-CM | POA: Diagnosis not present

## 2018-02-16 DIAGNOSIS — R1310 Dysphagia, unspecified: Secondary | ICD-10-CM | POA: Diagnosis not present

## 2018-02-16 DIAGNOSIS — C50111 Malignant neoplasm of central portion of right female breast: Secondary | ICD-10-CM

## 2018-02-16 DIAGNOSIS — I10 Essential (primary) hypertension: Secondary | ICD-10-CM

## 2018-02-16 DIAGNOSIS — R110 Nausea: Secondary | ICD-10-CM | POA: Diagnosis not present

## 2018-02-16 DIAGNOSIS — Z7189 Other specified counseling: Secondary | ICD-10-CM

## 2018-02-16 LAB — CBC WITH DIFFERENTIAL (CANCER CENTER ONLY)
ABS IMMATURE GRANULOCYTES: 0.2 10*3/uL — AB (ref 0.00–0.07)
Basophils Absolute: 0 10*3/uL (ref 0.0–0.1)
Basophils Relative: 0 %
EOS PCT: 0 %
Eosinophils Absolute: 0 10*3/uL (ref 0.0–0.5)
HEMATOCRIT: 41.7 % (ref 36.0–46.0)
HEMOGLOBIN: 13.9 g/dL (ref 12.0–15.0)
Immature Granulocytes: 1 %
LYMPHS ABS: 0.3 10*3/uL — AB (ref 0.7–4.0)
LYMPHS PCT: 2 %
MCH: 30.7 pg (ref 26.0–34.0)
MCHC: 33.3 g/dL (ref 30.0–36.0)
MCV: 92.1 fL (ref 80.0–100.0)
MONO ABS: 0.6 10*3/uL (ref 0.1–1.0)
Monocytes Relative: 4 %
NEUTROS ABS: 14.7 10*3/uL — AB (ref 1.7–7.7)
Neutrophils Relative %: 93 %
Platelet Count: 123 10*3/uL — ABNORMAL LOW (ref 150–400)
RBC: 4.53 MIL/uL (ref 3.87–5.11)
RDW: 14.1 % (ref 11.5–15.5)
WBC Count: 15.9 10*3/uL — ABNORMAL HIGH (ref 4.0–10.5)
nRBC: 0 % (ref 0.0–0.2)

## 2018-02-16 LAB — CMP (CANCER CENTER ONLY)
ALBUMIN: 3.1 g/dL — AB (ref 3.5–5.0)
ALT: 156 U/L — AB (ref 0–44)
AST: 37 U/L (ref 15–41)
Alkaline Phosphatase: 89 U/L (ref 38–126)
Anion gap: 11 (ref 5–15)
BUN: 27 mg/dL — AB (ref 8–23)
CHLORIDE: 100 mmol/L (ref 98–111)
CO2: 21 mmol/L — AB (ref 22–32)
CREATININE: 0.9 mg/dL (ref 0.44–1.00)
Calcium: 8.7 mg/dL — ABNORMAL LOW (ref 8.9–10.3)
GFR, Est AFR Am: >60 mL/min (ref >60)
GFR, Estimated: >60 mL/min (ref >60)
GLUCOSE: 106 mg/dL — AB (ref 70–99)
POTASSIUM: 4.9 mmol/L (ref 3.5–5.1)
Sodium: 132 mmol/L — ABNORMAL LOW (ref 135–145)
Total Bilirubin: 0.5 mg/dL (ref 0.3–1.2)
Total Protein: 6.1 g/dL — ABNORMAL LOW (ref 6.5–8.1)

## 2018-02-16 LAB — CEA (IN HOUSE-CHCC): CEA (CHCC-IN HOUSE): 3.26 ng/mL (ref 0.00–5.00)

## 2018-02-16 NOTE — Progress Notes (Signed)
Tumor Board Discussion 02/15/18  Referral to rad/onc Not a surgical candidate Discussion of short course of radiation versus concurrent chemo/rad

## 2018-02-16 NOTE — Progress Notes (Signed)
  Oncology Nurse Navigator Documentation  Navigator Location: [facility] (02/16/18 1301)  Met with patient and her extended support team in lobby.  Explained role of nurse navigator.  Contact names and phone numbers were provided for entire [facility] team.   No barriers to care identified at present time.  Patient asked if she could be referral to dietician in the future and will let me know when she is ready. Made pt aware of supportive services available in the [facility] [place] [facility]. Will continue to follow as needed. She has my contact information for needs or concerns.

## 2018-02-16 NOTE — Progress Notes (Signed)
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    01/30/2018 Initial Diagnosis    Metastasis to brain [facility])    01/31/2018 Imaging    MRI Brain W WO Contrast 01/31/18  IMPRESSION: 3 Tesla study does not disclose any additional lesions. Solitary metastasis in the right superior cerebellum measuring 2.9 x 2.6 x 3.0 cm with mild surrounding edema. 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    02/03/2018 - 02/08/2018 Radiation Therapy    SBRT with Dr. Isidore Moos on 02/03/18, 02/06/18, 02/08/18     Esophageal cancer, stage IV ([place])   01/08/2018 Imaging    CT AP W Constrast 01/08/18  IMPRESSION: 1. Nonobstructed, nondistended bowel. No acute inflammatory process. 2. Moderate-sized hiatal hernia noted. 3. Low-density 1.7 cm left adrenal nodule likely to represent a small adenoma. Lower pole left renal cyst measuring 1.8 cm. 4. Moderate aortic atherosclerosis. 5. Lumbar spondylosis and facet arthropathy.    01/27/2018 Imaging    CT Chest W Contrast 01/27/18  IMPRESSION: 1. Large mass involving the distal half of the esophagus spanning approximately 11.7 cm is identified compatible with primary esophageal  neoplasm. 2. Small posterior mediastinal and high right paratracheal lymph nodes are noted which may represent foci of metastatic adenopathy. 3. The small pulmonary nodules in the right lung are unchanged from 01/15/2016 and are favored to represent a benign process. 4. Aortic Atherosclerosis (ICD10-I70.0) and Emphysema (ICD10-J43.9). 5. Coronary artery atherosclerotic calcifications.    01/30/2018 Initial Diagnosis    Esophageal cancer, stage IV ([place])    02/03/2018 - 02/08/2018 Radiation Therapy    SBRT to oligo brain metastasis, in 3 fractions, under Dr. Isidore Moos     02/16/2018 PET scan    PET 02/16/18 IMPRESSION: 1. A 15.7 cm in length esophageal mass extends from the aortic arch level down to the distal esophagus, maximum SUV 22.4, compatible with malignancy. There is a small right lower neck level IV lymph node with lower than mediastinal blood pool activity, and a right upper paratracheal lymph node with just above mediastinal blood pool activity. 2. Faint speckled heterogeneity in the liver, for example in the lateral segment left hepatic lobe, is likely incidental/benign given the lack of correlate of CT finding. It might be prudent to obtain an MRI just to make sure that there is not a small early metastatic lesion in this vicinity. 3. Known right cerebellar vermian metastatic lesion, with expected hypo activity compared to the surrounding brain activity. 4. The right middle lobe pulmonary nodule measures 1.1 by 1.0 cm and is mildly hypermetabolic with maximum SUV 2.6. This lesion measured 6 by 5 mm back on 12/06/2003 and 1.2 by 1.1 cm on 10/15/2015. I am uncertain what to make of the very slow growth and mildly  accentuated metabolic activity. This could be a chronic granulomatous process. Low-grade adenocarcinoma of the lung seems unlikely to be so indolent as to only mildly increase over a 14 year period, but is presumably a differential diagnostic  consideration. Surveillance is suggested. 5. Other imaging findings of potential clinical significance: Aortic Atherosclerosis (ICD10-I70.0). Descending and sigmoid colon diverticulosis. Gas in the urinary bladder, most common cause would be recent catheterization. Minimal chronic left ethmoid sinusitis.     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 Ultrasound-guided biopsy is recommended.    01/10/2018 Initial Biopsy    Diagnsis from Breast Biopsy 01/10/18  Breast, right, needle core biopsy, 6 o'clock - INVASIVE DUCTAL CARCINOMA WITH CALCIFICATIONS, SEE COMMENT. - DUCTAL CARCINOMA IN SITU. 1 of 2 FINAL for PATRIECE, ARCHBOLD [phone]) Microscopic Comment The carcinoma appears grade 2. Prognostic markers will be ordered. Dr. [name] has reviewed the case. The case was called to [place] on 01/11/2018.    01/10/2018 Receptors her2    The tumor cells are NEGATIVE for Her2 (1+). Estrogen Receptor: 100%, POSITIVE, STRONG STAINING INTENSITY Progesterone Receptor: 100%, POSITIVE, STRONG STAINING INTENSITY Proliferation Marker Ki67: 15%    01/10/2018 Cancer Staging    Staging form: Breast, AJCC 8th Edition - Clinical stage from 01/10/2018: Stage IA (cT1b, cN0, cM0, G2, ER+, PR+, HER2-) - Signed by [name], MD on 02/03/2018    01/30/2018 Initial Diagnosis    Breast cancer [facility])      CURRENT THERAPY:  SBRT starting on 02/03/18-02/08/18  INTERVAL HISTORY:  Kristin Williams is here for a follow up and discuss PET scan and treatment plan. She presents to the clinic today accompanied by her family. She  has completed SBRT. She does not drooling lately and bladder incontinence and slurred speech along with right sided weakness. She does have a Iliac stent in the right side. These symptoms started after her first radiation. She notes slight vision blurred. Her speech is impeded by her throat feeling like cotton and dysphagia with swallowing She has been taking liquid tylenol as needed. She has been eating mostly soup and ice cream. She denies nausea. She has been taking Reglan. Compazine makes her feel sick she does not take it. She has a cane for her balance issues but mostly uses her family members to walk with her. She denies having anymore dizziness. She is on Dexa 42m daily tapering. She plans to be off 12/04/19/17. She sleeps about 305 hours while on dexa.  She has bulging disc and arthritis so she goes to orthopedist.  She notes she tried both boost and ensure and did not like any of them.  She is ready to proceed for chemoRT. She notes having mild neuropathy from previous breast cancer chemo.     REVIEW OF SYSTEMS:   Constitutional: Denies fevers, chills or abnormal weight loss Eyes: (+) blurriness of vision Ears, nose, mouth, throat, and face: Denies mucositis or sore throat (+) dysphagia (+) slurred speech (+) drooling Respiratory: Denies cough, dyspnea or wheezes Cardiovascular: Denies palpitation, chest discomfort or lower extremity swelling UI: (+) Bladder incontinence Gastrointestinal:  Denies nausea, heartburn or  change in bowel habits Skin: Denies abnormal skin rashes MSK: (+) Bulging disc with chronic back pain (+) arthritis  Lymphatics: Denies new lymphadenopathy or easy bruising Neurological:Denies numbness, tingling or new weaknesses (+) balance issues with direction (+) right sided weakness Behavioral/Psych: Mood is stable, no new changes  All other systems were reviewed with the patient and are negative.  MEDICAL HISTORY:  Past Medical History:  Diagnosis Date  . Arthritis    . Cancer (HCC)    Cervical  . Cancer ([place])    Breast  . Cancer ([place])    Vaginal  . Complication of anesthesia   . COPD (chronic obstructive pulmonary disease) ([place])   . Headache(784.0)   . Hyperlipidemia   . Leg pain   . Peripheral vascular disease ([place])   . Personal history of chemotherapy   . Personal history of radiation therapy   . PONV (postoperative nausea and vomiting)     SURGICAL HISTORY: Past Surgical History:  Procedure Laterality Date  . ABDOMINAL HYSTERECTOMY  1975  . BREAST LUMPECTOMY  2005  . GANGLION CYST EXCISION  1977  . ILIAC ARTERY STENT  04/20/2004   CSD right iliac occlusive disease    I have reviewed the social history and family history with the patient and they are unchanged from previous note.  ALLERGIES:  is allergic to flonase [fluticasone propionate].  MEDICATIONS:  Current Outpatient Medications  Medication Sig Dispense Refill  . dexamethasone (DECADRON) 4 MG tablet Take 1 tablet (4 mg total) by mouth every 12 (twelve) hours. (Patient taking differently: Take 4 mg by mouth daily. ) 60 tablet 2  . fluconazole (DIFLUCAN) 100 MG tablet Take 2 tablets today, then 1 tablet daily x 20 more days. 22 tablet 0  . LORazepam (ATIVAN) 1 MG tablet Take 1 tablet (1 mg total) by mouth 3 (three) times daily as needed for anxiety. 15 tablet 0  . metoCLOPramide (REGLAN) 10 MG tablet Take 1 tablet (10 mg total) by mouth every 6 (six) hours as needed for nausea. 60 tablet 2  . omeprazole (PRILOSEC) 20 MG capsule Take 40 mg by mouth daily.     . ondansetron (ZOFRAN) 8 MG tablet Take 8 mg by mouth every 6 (six) hours as needed for nausea or vomiting.   0  . prochlorperazine (COMPAZINE) 10 MG tablet Take 1 tablet (10 mg total) by mouth every 6 (six) hours as needed for nausea or vomiting. (Patient not taking: Reported on 02/16/2018) 60 tablet 1   No current facility-administered medications for this visit.     PHYSICAL EXAMINATION: ECOG PERFORMANCE STATUS: 2 -  Symptomatic, <50% confined to bed  Vitals:   02/16/18 1317  BP: (!) 146/74  Pulse: 68  Resp: 18  Temp: 97.8 F (36.6 C)  SpO2: 99%   Filed Weights   02/16/18 1317  Weight: 209 lb 3.2 oz (94.9 kg)    GENERAL:alert, no distress and comfortable SKIN: skin color, texture, turgor are normal, no rashes or significant lesions EYES: normal, Conjunctiva are pink and non-injected, sclera clear OROPHARYNX:no exudate, no erythema and lips, buccal mucosa, and tongue normal  NECK: supple, thyroid normal size, non-tender, without nodularity LYMPH:  no palpable lymphadenopathy in the cervical, axillary or inguinal LUNGS: clear to auscultation and percussion with normal breathing effort HEART: regular rate & rhythm and no murmurs and no lower extremity edema ABDOMEN:abdomen soft, non-tender and normal bowel sounds Musculoskeletal:no cyanosis of digits and no clubbing  NEURO: alert & oriented x 3 with fluent speech,  no focal motor/sensory deficits (+) adequate ambulation but mild directional trouble turning (+) mild b/l lower extremity weakness  LABORATORY DATA:  I have reviewed the data as listed CBC Latest Ref Rng & Units 02/16/2018 01/30/2018 01/29/2018  WBC 4.0 - 10.5 K/uL 15.9(H) 9.8 11.8(H)  Hemoglobin 12.0 - 15.0 g/dL 13.9 12.3 12.7  Hematocrit 36.0 - 46.0 % 41.7 38.4 38.9  Platelets 150 - 400 K/uL 123(L) 263 308     CMP Latest Ref Rng & Units 02/16/2018 01/30/2018 01/29/2018  Glucose 70 - 99 mg/dL 106(H) 135(H) 138(H)  BUN 8 - 23 mg/dL 27(H) 18 14  Creatinine 0.44 - 1.00 mg/dL 0.90 0.97 1.03(H)  Sodium 135 - 145 mmol/L 132(L) 136 137  Potassium 3.5 - 5.1 mmol/L 4.9 4.4 4.1  Chloride 98 - 111 mmol/L 100 107 104  CO2 22 - 32 mmol/L 21(L) 24 23  Calcium 8.9 - 10.3 mg/dL 8.7(L) 8.9 9.7  Total Protein 6.5 - 8.1 g/dL 6.1(L) - -  Total Bilirubin 0.3 - 1.2 mg/dL 0.5 - -  Alkaline Phos 38 - 126 U/L 89 - -  AST 15 - 41 U/L 37 - -  ALT 0 - 44 U/L 156(H) - -      RADIOGRAPHIC  STUDIES: I have personally reviewed the radiological images as listed and agreed with the findings in the report. No results found.   ASSESSMENT & PLAN:  Kristin Williams is a 72 y.o. female with   1. 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 Given her stage IV disease her cancer is not curable but still treatable. Goal of care it to control her disease.   -She completed SBRT to her brain mets last week.  -I personally reviewed her PET from 02/16/18 and discussed with pt and her family, it showed lung nodule she had since 2017 and has been getting smaller, likely benign. She has a benign cysts in her kidney. There is a mildly hypermetabolic right supraclavicular LN. No other evidence of distant metastasis. Her primary esophageal mass is very long and hot on PET, along with a hypermetabolic right upper paratracheal node.  -Her MMR panel is still pending.  -Given the no significant other distant mets, I recommend concurrent chemoRT to treat her primary esophageal cancer with definitive radiation dose.  I recommend concurrent chemotherapy with weekly carboplatin and Taxol, for better local disease control. I will discuss start date with Dr. Isidore Moos. Will monitor her labs closely for low blood counts.  --Chemotherapy consent: Side effects including but does not not limited to, fatigue, nausea, vomiting, diarrhea, hair loss, neuropathy, fluid retention, renal and kidney dysfunction, neutropenic fever, needed for blood transfusion, bleeding, were discussed with patient in great detail. She agrees to proceed. -the goal of therapy is palliative  -Will get PAC placed and chemo education class in the next 2 weeks.  -Given the brain metastasis, surgical resection is not recommended.  Her case was discussed in our GI tumor board yesterday.  I discussed her prognosis for her aggressive disease. She understands  there is a very small chance of complete response with chemoRT. Goal of care is to control her disease.  -F/u with start of treatment    2. Right Breast Invasive Ductal Carcinoma, cT1bN0M0 stage IA, Grade II, ER/PR Positive, HER 2 negative -Diagnosed through screening mammogram, same time as her esophageal cancer in October 2019.  -Given her early stage breast cancer, I  discussed her breast cancer treatment is less urgent than esophogeal cancer treatment.  -I plan to start her on anti-estrogen therapy after she completes chemoradiation for esophageal cancer, given her ER/PR positive cancer. If she does very well in 6 to 12 months without esophageal cancer without progression, we may consider breast surgery.    3. Oligo brain mets -This is likely from her primary esophogeal cancer.  -She does have balance issues and ambulates with assistance. She knows she is at risk for fall and that she should be careful.  -She is s/p SBRT. Has mild slurred speech and balance stability has slightly improved. I discussed this should improve post-radiation.  -Will monitor with brain scan every 3 months.   4. H/o Left breast cancer, ER/PR negative, treated by Dr. Jana Hakim with lumpectomy, adj, chemo and radiation.  -She was previously referred to genetics  -no evidence of recurrence    5. Dysphagia, nausea, low appetite   -Pain is mild and stable. She will continue with soft food diet  -She does not like ensure or boost. She will try carnation breakfast and juicing her food. I suggest she also try diluting supplement. I strongly encouraged her to increase her calorie intake.   -Continue liquid Tylenol before eating. -She does not tolerate Compazine. She has Zofran but Reglan gives her the best relief. Nausea is currently resolved.  -She is on taper dose Dexa, currently at 49m. Plans to be off 03/10/18.  -I encouraged her to follow up with Dietician.  -I think she did not need a feeding tube for now, we  discussed her dysphagia and odynophagia make it worse during chemo and radiation, and the possibility she may needs a feeding tube.  6. Goal of care discussion  -We again discussed the incurable nature of her cancer, and the overall poor prognosis, especially if she does not have good response to chemotherapy or progress on chemo -The patient understands the goal of care is palliative. 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 She is interested.    PLAN:  -lab and PET scan reviewed -I recommend concurrent chemoRT with weekly carboplatin and Taxol for her esophageal cancer, gust with Dr. SIsidore Moos and arrange chemo class and first treatment in the next few weeks  -dietician consult    No problem-specific Assessment & Plan notes found for this encounter.   No orders of the defined types were placed in this encounter.  All questions were answered. The patient knows to call the clinic with any problems, questions or concerns. No barriers to learning was detected. I spent 30 minutes counseling the patient face to face. The total time spent in the appointment was 40 minutes and more than 50% was on counseling and review of test results     YTruitt Merle MD 02/16/2018   I, AJoslyn Devon am acting as scribe for YTruitt Merle MD.   I have reviewed the above documentation for accuracy and completeness, and I agree with the above.

## 2018-02-17 ENCOUNTER — Other Ambulatory Visit: Payer: Self-pay

## 2018-02-17 ENCOUNTER — Emergency Department (HOSPITAL_COMMUNITY)
Admission: EM | Admit: 2018-02-17 | Discharge: 2018-02-18 | Disposition: A | Discharge status: Home / Self Care | Payer: Medicare Other | Attending: Emergency Medicine | Admitting: Emergency Medicine

## 2018-02-17 ENCOUNTER — Telehealth: Payer: Self-pay | Admitting: Hematology

## 2018-02-17 ENCOUNTER — Emergency Department (HOSPITAL_COMMUNITY): Payer: Medicare Other

## 2018-02-17 ENCOUNTER — Encounter: Payer: Self-pay | Admitting: Hematology

## 2018-02-17 ENCOUNTER — Encounter (HOSPITAL_COMMUNITY): Payer: Self-pay

## 2018-02-17 DIAGNOSIS — S01112A Laceration without foreign body of left eyelid and periocular area, initial encounter: Secondary | ICD-10-CM | POA: Diagnosis not present

## 2018-02-17 DIAGNOSIS — Y939 Activity, unspecified: Secondary | ICD-10-CM | POA: Insufficient documentation

## 2018-02-17 DIAGNOSIS — S0990XA Unspecified injury of head, initial encounter: Secondary | ICD-10-CM

## 2018-02-17 DIAGNOSIS — D496 Neoplasm of unspecified behavior of brain: Secondary | ICD-10-CM | POA: Diagnosis not present

## 2018-02-17 DIAGNOSIS — Y929 Unspecified place or not applicable: Secondary | ICD-10-CM | POA: Insufficient documentation

## 2018-02-17 DIAGNOSIS — Z87891 Personal history of nicotine dependence: Secondary | ICD-10-CM | POA: Diagnosis not present

## 2018-02-17 DIAGNOSIS — Z8541 Personal history of malignant neoplasm of cervix uteri: Secondary | ICD-10-CM | POA: Insufficient documentation

## 2018-02-17 DIAGNOSIS — W01190A Fall on same level from slipping, tripping and stumbling with subsequent striking against furniture, initial encounter: Secondary | ICD-10-CM | POA: Insufficient documentation

## 2018-02-17 DIAGNOSIS — Z853 Personal history of malignant neoplasm of breast: Secondary | ICD-10-CM | POA: Insufficient documentation

## 2018-02-17 DIAGNOSIS — Z7189 Other specified counseling: Secondary | ICD-10-CM | POA: Insufficient documentation

## 2018-02-17 DIAGNOSIS — C7931 Secondary malignant neoplasm of brain: Secondary | ICD-10-CM | POA: Insufficient documentation

## 2018-02-17 DIAGNOSIS — J449 Chronic obstructive pulmonary disease, unspecified: Secondary | ICD-10-CM | POA: Insufficient documentation

## 2018-02-17 DIAGNOSIS — Z79899 Other long term (current) drug therapy: Secondary | ICD-10-CM | POA: Diagnosis not present

## 2018-02-17 DIAGNOSIS — Y999 Unspecified external cause status: Secondary | ICD-10-CM | POA: Diagnosis not present

## 2018-02-17 DIAGNOSIS — W19XXXA Unspecified fall, initial encounter: Secondary | ICD-10-CM

## 2018-02-17 DIAGNOSIS — S01111A Laceration without foreign body of right eyelid and periocular area, initial encounter: Secondary | ICD-10-CM | POA: Diagnosis not present

## 2018-02-17 NOTE — Progress Notes (Signed)
  Radiation Oncology         [phone] ________________________________  Name: Kristin Williams MRN: [id number]  Date: 02/08/2018  DOB: February 01, 1946  End of Treatment Note  Diagnosis:   Metastatic disease to the brain.     Indication for treatment:  Palliative       Radiation treatment dates:   02/03/2018 - 02/08/2018  Site/dose:   Brain Rt Sup Cerebellum 80mm / 27 Gy in 3 fractions  Beams/energy:  [facility] VMAT // 6FFF photons  Narrative: The patient tolerated SRS treatment relatively well.   She did not develop any acute side effects with treatment.  Plan: The patient has completed radiation treatment. The patient will return to radiation oncology clinic for routine followup in one month, sooner if needed for palliation to her esophagus. I advised them to call or return sooner if they have any questions or concerns related to their recovery or treatment.  -----------------------------------  Eppie Gibson, MD   This document serves as a record of services personally performed by Eppie Gibson, MD. It was created on her behalf by Arlyce Harman, a trained medical scribe. The creation of this record is based on the scribe's personal observations and the provider's statements to them. This document has been checked and approved by the attending provider.

## 2018-02-17 NOTE — ED Notes (Signed)
Pt back from ct

## 2018-02-17 NOTE — ED Triage Notes (Signed)
Pt reports having brain tumor and currently being treated at [facility] center (radiation to head completed last week). Pt reports falling when attempting to put shoes on by losing balance. Pt denies dizziness prior to fall. No syncope. Pt has slight headache per report. Laceration/brusing noted to left eye from fall. No bleeding.

## 2018-02-17 NOTE — Telephone Encounter (Signed)
No los per 12/05

## 2018-02-18 NOTE — Discharge Instructions (Addendum)
CT scan of the head and face without any bony injuries or any acute brain injury.  The laceration to the left eyebrow area should be treated with a topical antibiotic.  Will heal well on its own.  Return for any new or worse symptoms.  Follow-up with your doctors as scheduled.

## 2018-02-18 NOTE — ED Provider Notes (Signed)
[facility] EMERGENCY DEPARTMENT Provider Note   CSN: [id number] Arrival date & time: 02/17/18  2106     History   Chief Complaint Chief Complaint  Patient presents with  . Fall    HPI Kristin Williams is a 72 y.o. female.  Patient with known metastatic cancer to the brain.  Patient had a fall this evening where she tripped she did not pass out she hit the side of a bookcase and has a laceration to her left eyebrow area.  And swelling to her upper eyelid.  Patient without any complaints.  Patient's tetanus is up-to-date.  Patient ambulatory no pain to the legs or hips.  No neck pain no back pain.     Past Medical History:  Diagnosis Date  . Arthritis   . Cancer (HCC)    Cervical  . Cancer ([place])    Breast  . Cancer ([place])    Vaginal  . Complication of anesthesia   . COPD (chronic obstructive pulmonary disease) ([place])   . Headache(784.0)   . Hyperlipidemia   . Leg pain   . Peripheral vascular disease ([place])   . Personal history of chemotherapy   . Personal history of radiation therapy   . PONV (postoperative nausea and vomiting)     Patient Active Problem List   Diagnosis Date Noted  . Goals of care, counseling/discussion 02/17/2018  . Metastasis to brain ([place]) 01/30/2018  . Esophageal cancer, stage IV ([place]) 01/30/2018  . 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 Hypertension, essential 01/30/2018  . Hyperlipidemia 01/30/2018  . Tobacco use disorder 01/30/2018  .  Metastatic brain hemorrhage 01/27/2018  . 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 Aftercare following surgery of the circulatory system, [name] 12/06/2012  . Iliac artery stenosis, right ([place]) 12/01/2011    Past Surgical History:  Procedure Laterality Date  . ABDOMINAL HYSTERECTOMY  1975  . BREAST LUMPECTOMY  2005  . GANGLION CYST EXCISION  1977  . ILIAC ARTERY STENT  04/20/2004   CSD right iliac occlusive disease     OB History     None      Home Medications    Prior to Admission medications   Medication Sig Start Date End Date Taking? Authorizing Provider  acetaminophen (TYLENOL) 160 MG/5ML liquid Take 325 mg by mouth every 4 (four) hours as needed for fever.   Yes [provider]  dexamethasone (DECADRON) 4 MG tablet Take 1 tablet (4 mg total) by mouth every 12 (twelve) hours. Patient taking differently: Take 4 mg by mouth daily.  01/31/18  Yes Vonzella Nipple, NP  fluconazole (DIFLUCAN) 100 MG tablet Take 2 tablets today, then 1 tablet daily x 20 more days. 01/31/18  Yes Eppie Gibson, MD  LORazepam (ATIVAN) 1 MG tablet Take 1 tablet (1 mg total) by mouth 3 (three) times daily as needed for anxiety. 01/08/18  Yes Nat Christen, MD  metoCLOPramide (REGLAN) 10 MG tablet Take 1 tablet (10 mg total) by mouth every 6 (six) hours as needed for nausea. 01/31/18  Yes Vonzella Nipple, NP  omeprazole (PRILOSEC) 20 MG capsule Take 40 mg by mouth daily.  09/09/10  Yes [provider]  prochlorperazine (COMPAZINE) 10 MG tablet Take 1 tablet (10 mg total) by mouth every 6 (six) hours as needed for nausea or vomiting. Patient not taking: Reported on 02/16/2018 01/31/18   Eppie Gibson, MD    Family History Family History  Problem Relation  Age of Onset  . Cancer Mother 53       Non-Hodgkins Disease T-Cell  . Heart failure Father   . COPD Father   . Heart disease Brother 27       MI and Heart Disease before age 2  . Heart attack Brother   . Lung cancer Maternal Uncle   . Cancer Cousin   . Prostate cancer Maternal Uncle   . Prostate cancer Brother        half brother  . Breast cancer Neg Hx     Social History Social History   Tobacco Use  . Smoking status: Former Smoker    Packs/day: 0.50    Years: 40.00    Pack years: 20.00    Types: Cigarettes    Last attempt to quit: 01/27/2018    Years since quitting: 0.0  . Smokeless tobacco: Never Used  Substance Use Topics  . Alcohol use: No   . Drug use: No     Allergies   Flonase [fluticasone propionate]   Review of Systems Review of Systems  Constitutional: Negative for fever.  HENT: Negative for congestion.   Eyes: Negative for visual disturbance.  Respiratory: Negative for shortness of breath.   Cardiovascular: Negative for chest pain.  Gastrointestinal: Negative for abdominal pain.  Genitourinary: Negative for dysuria.  Musculoskeletal: Negative for back pain and neck pain.  Skin: Positive for wound.  Allergic/Immunologic: Positive for immunocompromised state.  Neurological: Negative for syncope and headaches.  Hematological: Does not bruise/bleed easily.  Psychiatric/Behavioral: Negative for confusion.     Physical Exam Updated Vital Signs BP (!) 132/52   Pulse 76   Temp 97.7 F (36.5 C) (Oral)   Resp 18   Ht 1.524 m (5')   Wt 94.3 kg   LMP 02/17/2018   SpO2 98%   BMI 40.62 kg/m   Physical Exam  Constitutional: She is oriented to person, place, and time. She appears well-developed and well-nourished. No distress.  HENT:  Head: Normocephalic.  Patient with swelling to the left upper eyelid with a 3 cm superficial laceration and eyebrow area.  Closed and sealed but not strong no bleeding.  Eyes: Pupils are equal, round, and reactive to light. Conjunctivae and EOM are normal.  Extraocular muscles are intact.  No evidence of hyphema.  Neck: Normal range of motion. Neck supple.  No posterior neck tenderness.  Good range of motion.  No distracting injury.  Cardiovascular: Normal rate, regular rhythm and normal heart sounds.  Pulmonary/Chest: Effort normal and breath sounds normal. No respiratory distress.  Abdominal: Soft. Bowel sounds are normal. There is no tenderness.  Musculoskeletal: Normal range of motion.  Neurological: She is alert and oriented to person, place, and time. No cranial nerve deficit or sensory deficit. She exhibits normal muscle tone. Coordination normal.  Skin: Skin is warm.  Capillary refill takes less than 2 seconds.  Nursing note and vitals reviewed.    ED Treatments / Results  Labs (all labs ordered are listed, but only abnormal results are displayed) Labs Reviewed - No data to display  EKG None  Radiology Ct Head Wo Contrast  Result Date: 02/17/2018 CLINICAL DATA:  Brain tumor being treated radiation. Imbalance. EXAM: CT HEAD WITHOUT CONTRAST CT MAXILLOFACIAL WITHOUT CONTRAST TECHNIQUE: Multidetector CT imaging of the head and maxillofacial structures were performed using the standard protocol without intravenous contrast. Multiplanar CT image reconstructions of the maxillofacial structures were also generated. COMPARISON:  MRI 01/31/2018 and CT 01/26/2018 FINDINGS: CT HEAD FINDINGS Brain:  Redemonstration of a calcified intra-axial mass involving the right cerebellar hemisphere with slight extrinsic mass effect on the adjacent fourth ventricle and vermis from the right. This currently measures 2.5 x 2.8 x 2.6 cm. No additional lesions are noted. Cerebral atrophy with minimal small vessel ischemic disease in the supratentorial compartment is noted. No hydrocephalus is seen. No extra-axial fluid. Vascular: No hyperdense vessel sign. Skull: Intact Other: None CT MAXILLOFACIAL FINDINGS Osseous: Intact maxillofacial bones. No aggressive osseous lesions. Orbits: Bilateral lens replacements. Symmetric appearance of the globes. No retrobulbar abnormality. Sinuses: Mild ethmoid sinus mucosal thickening. The remainder of the paranasal sinuses are clear. Soft tissues: Left periorbital soft tissue laceration with contusion/swelling. IMPRESSION: 1. 2.5 x 2.8 x 2.6 cm calcified intra-axial mass involving the right cerebellar hemisphere with slight extrinsic mass effect on the adjacent fourth ventricle and vermis from the right. This is stable in appearance consistent solitary metastasis. 2. Left periorbital soft tissue laceration with soft tissue swelling about without underlying  fracture. 3. Cerebral atrophy with minimal small vessel ischemic disease. Electronically Signed   By: Ashley Royalty M.D.   On: 02/17/2018 23:37   Ct Maxillofacial Wo Cm  Result Date: 02/17/2018 CLINICAL DATA:  Brain tumor being treated radiation. Imbalance. EXAM: CT HEAD WITHOUT CONTRAST CT MAXILLOFACIAL WITHOUT CONTRAST TECHNIQUE: Multidetector CT imaging of the head and maxillofacial structures were performed using the standard protocol without intravenous contrast. Multiplanar CT image reconstructions of the maxillofacial structures were also generated. COMPARISON:  MRI 01/31/2018 and CT 01/26/2018 FINDINGS: CT HEAD FINDINGS Brain: Redemonstration of a calcified intra-axial mass involving the right cerebellar hemisphere with slight extrinsic mass effect on the adjacent fourth ventricle and vermis from the right. This currently measures 2.5 x 2.8 x 2.6 cm. No additional lesions are noted. Cerebral atrophy with minimal small vessel ischemic disease in the supratentorial compartment is noted. No hydrocephalus is seen. No extra-axial fluid. Vascular: No hyperdense vessel sign. Skull: Intact Other: None CT MAXILLOFACIAL FINDINGS Osseous: Intact maxillofacial bones. No aggressive osseous lesions. Orbits: Bilateral lens replacements. Symmetric appearance of the globes. No retrobulbar abnormality. Sinuses: Mild ethmoid sinus mucosal thickening. The remainder of the paranasal sinuses are clear. Soft tissues: Left periorbital soft tissue laceration with contusion/swelling. IMPRESSION: 1. 2.5 x 2.8 x 2.6 cm calcified intra-axial mass involving the right cerebellar hemisphere with slight extrinsic mass effect on the adjacent fourth ventricle and vermis from the right. This is stable in appearance consistent solitary metastasis. 2. Left periorbital soft tissue laceration with soft tissue swelling about without underlying fracture. 3. Cerebral atrophy with minimal small vessel ischemic disease. Electronically Signed   By:  Ashley Royalty M.D.   On: 02/17/2018 23:37    Procedures Procedures (including critical care time)  Medications Ordered in ED Medications - No data to display   Initial Impression / Assessment and Plan / ED Course  I have reviewed the triage vital signs and the nursing notes.  Pertinent labs & imaging results that were available during my care of the patient were reviewed by me and considered in my medical decision making (see chart for details).     CT head and face without any acute injuries.  Left eyebrow laceration is sealed.  No suturing required.  Just wound care.  Final Clinical Impressions(s) / ED Diagnoses   Final diagnoses:  Fall, initial encounter  Injury of head, initial encounter  Eyebrow laceration, left, initial encounter    ED Discharge Orders    None       Fredia Sorrow,  MD 02/18/18 2297

## 2018-02-20 ENCOUNTER — Encounter: Payer: Medicare Other | Admitting: Licensed Clinical Social Worker

## 2018-02-20 ENCOUNTER — Other Ambulatory Visit: Payer: Medicare Other

## 2018-02-20 ENCOUNTER — Telehealth: Payer: Self-pay | Admitting: Hematology

## 2018-02-20 ENCOUNTER — Other Ambulatory Visit: Payer: Self-pay | Admitting: Radiology

## 2018-02-20 ENCOUNTER — Encounter: Payer: Medicare Other | Admitting: Nutrition

## 2018-02-20 NOTE — Telephone Encounter (Signed)
sch message per 12/6 scheduled - pt is aware of appt date and time

## 2018-02-21 ENCOUNTER — Ambulatory Visit: Payer: Medicare Other

## 2018-02-21 ENCOUNTER — Ambulatory Visit: Payer: Medicare Other | Admitting: Radiation Oncology

## 2018-02-21 ENCOUNTER — Ambulatory Visit
Admission: RE | Admit: 2018-02-21 | Discharge: 2018-02-21 | Disposition: A | Discharge status: Home / Self Care | Payer: Medicare Other | Source: Ambulatory Visit | Attending: Radiation Oncology | Admitting: Radiation Oncology

## 2018-02-21 DIAGNOSIS — Z510 Encounter for antineoplastic radiation therapy: Secondary | ICD-10-CM | POA: Diagnosis not present

## 2018-02-21 DIAGNOSIS — Z853 Personal history of malignant neoplasm of breast: Secondary | ICD-10-CM | POA: Insufficient documentation

## 2018-02-21 DIAGNOSIS — C154 Malignant neoplasm of middle third of esophagus: Secondary | ICD-10-CM | POA: Insufficient documentation

## 2018-02-21 DIAGNOSIS — C159 Malignant neoplasm of esophagus, unspecified: Secondary | ICD-10-CM | POA: Insufficient documentation

## 2018-02-21 DIAGNOSIS — C7931 Secondary malignant neoplasm of brain: Secondary | ICD-10-CM | POA: Diagnosis not present

## 2018-02-21 NOTE — Progress Notes (Signed)
  Radiation Oncology         ([phone]) [phone] ________________________________  Name: Kristin Williams MRN: [id number]  Date: 02/21/2018  DOB: 1945-09-16  SIMULATION AND TREATMENT PLANNING NOTE / Special Treatment Procedure Note  Outpatient  DIAGNOSIS:     ICD-10-CM   1. 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  Identity was confirmed.  All relevant records and images related to the planned course of therapy were reviewed.  The patient freely provided informed written consent to proceed with treatment after reviewing the details related to the planned course of therapy. The consent form was witnessed and verified by the simulation staff.    Then, the patient was set-up in a stable reproducible  supine position for radiation therapy.  CT images were obtained.  Surface markings were placed.  The CT images were loaded into the planning software.    TREATMENT PLANNING NOTE: Treatment planning then occurred.  The radiation prescription was entered and confirmed.    I have requested : Intensity Modulated Radiotherapy (IMRT) is medically necessary for this case for the following reason:  Previous treatment to this area, and to spare lungs, heart.  The patient will receive 50.4 Gy in 28 fractions to gross disease in esophagus and adjacent nodes.  Special Treatment Procedure Note: The patient will be receiving chemotherapy concurrently. Chemotherapy heightens the risk of side effects. I have considered this during the patient's treatment planning process and will monitor the patient accordingly for side effects on a weekly basis. Concurrent chemotherapy increases the complexity of this patient's treatment and therefore this constitutes a special treatment procedure.  Special Treatment Procedure Note: The patient received prior radiotherapy close to hercurrent fields. There could be some overlap of radiation dose.  Prior  regional radiotherapy increases the risk of side effects from treatment. I have considered this in the treatment planning process and have aimed to minimize tissue overlap.  This increases the complexity of this patient's treatment and therefore this constitutes a special treatment procedure.    -----------------------------------  Eppie Gibson, MD

## 2018-02-22 ENCOUNTER — Ambulatory Visit (HOSPITAL_COMMUNITY)
Admission: RE | Admit: 2018-02-22 | Discharge: 2018-02-22 | Disposition: A | Discharge status: Home / Self Care | Payer: Medicare Other | Source: Ambulatory Visit | Attending: Hematology | Admitting: Hematology

## 2018-02-22 ENCOUNTER — Inpatient Hospital Stay: Payer: Medicare Other

## 2018-02-22 ENCOUNTER — Encounter (HOSPITAL_COMMUNITY): Payer: Self-pay

## 2018-02-22 ENCOUNTER — Other Ambulatory Visit: Payer: Self-pay | Admitting: Hematology

## 2018-02-22 ENCOUNTER — Encounter: Payer: Self-pay | Admitting: Hematology

## 2018-02-22 ENCOUNTER — Other Ambulatory Visit: Payer: Self-pay

## 2018-02-22 DIAGNOSIS — Z801 Family history of malignant neoplasm of trachea, bronchus and lung: Secondary | ICD-10-CM | POA: Diagnosis not present

## 2018-02-22 DIAGNOSIS — Z87891 Personal history of nicotine dependence: Secondary | ICD-10-CM | POA: Diagnosis not present

## 2018-02-22 DIAGNOSIS — E785 Hyperlipidemia, unspecified: Secondary | ICD-10-CM | POA: Diagnosis not present

## 2018-02-22 DIAGNOSIS — Z5111 Encounter for antineoplastic chemotherapy: Secondary | ICD-10-CM | POA: Diagnosis not present

## 2018-02-22 DIAGNOSIS — C159 Malignant neoplasm of esophagus, unspecified: Secondary | ICD-10-CM | POA: Diagnosis not present

## 2018-02-22 DIAGNOSIS — Z888 Allergy status to other drugs, medicaments and biological substances status: Secondary | ICD-10-CM | POA: Diagnosis not present

## 2018-02-22 DIAGNOSIS — M1990 Unspecified osteoarthritis, unspecified site: Secondary | ICD-10-CM | POA: Diagnosis not present

## 2018-02-22 DIAGNOSIS — Z90710 Acquired absence of both cervix and uterus: Secondary | ICD-10-CM | POA: Insufficient documentation

## 2018-02-22 DIAGNOSIS — Z809 Family history of malignant neoplasm, unspecified: Secondary | ICD-10-CM | POA: Insufficient documentation

## 2018-02-22 DIAGNOSIS — Z8042 Family history of malignant neoplasm of prostate: Secondary | ICD-10-CM | POA: Insufficient documentation

## 2018-02-22 DIAGNOSIS — Z8249 Family history of ischemic heart disease and other diseases of the circulatory system: Secondary | ICD-10-CM | POA: Insufficient documentation

## 2018-02-22 DIAGNOSIS — J449 Chronic obstructive pulmonary disease, unspecified: Secondary | ICD-10-CM | POA: Insufficient documentation

## 2018-02-22 DIAGNOSIS — Z79899 Other long term (current) drug therapy: Secondary | ICD-10-CM | POA: Insufficient documentation

## 2018-02-22 DIAGNOSIS — Z807 Family history of other malignant neoplasms of lymphoid, hematopoietic and related tissues: Secondary | ICD-10-CM | POA: Insufficient documentation

## 2018-02-22 DIAGNOSIS — I739 Peripheral vascular disease, unspecified: Secondary | ICD-10-CM | POA: Insufficient documentation

## 2018-02-22 DIAGNOSIS — Z95820 Peripheral vascular angioplasty status with implants and grafts: Secondary | ICD-10-CM | POA: Insufficient documentation

## 2018-02-22 DIAGNOSIS — C50911 Malignant neoplasm of unspecified site of right female breast: Secondary | ICD-10-CM | POA: Insufficient documentation

## 2018-02-22 HISTORY — PX: IR IMAGING GUIDED PORT INSERTION: IMG5740

## 2018-02-22 LAB — BASIC METABOLIC PANEL
Anion gap: 9 (ref 5–15)
BUN: 21 mg/dL (ref 8–23)
CO2: 23 mmol/L (ref 22–32)
Calcium: 8.5 mg/dL — ABNORMAL LOW (ref 8.9–10.3)
Chloride: 99 mmol/L (ref 98–111)
Creatinine, Ser: 0.73 mg/dL (ref 0.44–1.00)
GFR calc Af Amer: >60 mL/min (ref >60)
GFR calc non Af Amer: >60 mL/min (ref >60)
Glucose, Bld: 122 mg/dL — ABNORMAL HIGH (ref 70–99)
Potassium: 5.1 mmol/L (ref 3.5–5.1)
Sodium: 131 mmol/L — ABNORMAL LOW (ref 135–145)

## 2018-02-22 LAB — CBC WITH DIFFERENTIAL/PLATELET
Abs Immature Granulocytes: 0.19 10*3/uL — ABNORMAL HIGH (ref 0.00–0.07)
BASOS ABS: 0 10*3/uL (ref 0.0–0.1)
Basophils Relative: 0 %
Eosinophils Absolute: 0 10*3/uL (ref 0.0–0.5)
Eosinophils Relative: 0 %
HCT: 40.7 % (ref 36.0–46.0)
Hemoglobin: 13.5 g/dL (ref 12.0–15.0)
Immature Granulocytes: 2 %
Lymphocytes Relative: 4 %
Lymphs Abs: 0.4 10*3/uL — ABNORMAL LOW (ref 0.7–4.0)
MCH: 30.5 pg (ref 26.0–34.0)
MCHC: 33.2 g/dL (ref 30.0–36.0)
MCV: 91.9 fL (ref 80.0–100.0)
Monocytes Absolute: 0.3 10*3/uL (ref 0.1–1.0)
Monocytes Relative: 4 %
Neutro Abs: 7.8 10*3/uL — ABNORMAL HIGH (ref 1.7–7.7)
Neutrophils Relative %: 90 %
Platelets: 120 10*3/uL — ABNORMAL LOW (ref 150–400)
RBC: 4.43 MIL/uL (ref 3.87–5.11)
RDW: 14.4 % (ref 11.5–15.5)
WBC: 8.8 10*3/uL (ref 4.0–10.5)
nRBC: 0.2 % (ref 0.0–0.2)

## 2018-02-22 LAB — PROTIME-INR
INR: 0.91
Prothrombin Time: 12.2 seconds (ref 11.4–15.2)

## 2018-02-22 MED ORDER — MIDAZOLAM HCL 2 MG/2ML IJ SOLN
INTRAMUSCULAR | Status: AC
  Filled 2018-02-22: qty 2

## 2018-02-22 MED ORDER — FENTANYL CITRATE (PF) 100 MCG/2ML IJ SOLN
INTRAMUSCULAR | Status: AC
  Filled 2018-02-22: qty 2

## 2018-02-22 MED ORDER — LIDOCAINE HCL 1 % IJ SOLN
INTRAMUSCULAR | Status: AC
  Filled 2018-02-22: qty 20

## 2018-02-22 MED ORDER — LIDOCAINE HCL (PF) 1 % IJ SOLN
INTRAMUSCULAR | Status: AC | PRN
  Administered 2018-02-22: 8 mL via SUBCUTANEOUS
  Administered 2018-02-22: 10 mL via SUBCUTANEOUS

## 2018-02-22 MED ORDER — CEFAZOLIN SODIUM-DEXTROSE 2-4 GM/100ML-% IV SOLN
INTRAVENOUS | Status: AC
  Administered 2018-02-22: 2 g via INTRAVENOUS
  Filled 2018-02-22: qty 100

## 2018-02-22 MED ORDER — CEFAZOLIN SODIUM-DEXTROSE 2-4 GM/100ML-% IV SOLN
2.0000 g | INTRAVENOUS | Status: AC
  Administered 2018-02-22: 2 g via INTRAVENOUS

## 2018-02-22 MED ORDER — HEPARIN SOD (PORK) LOCK FLUSH 100 UNIT/ML IV SOLN
INTRAVENOUS | Status: AC
  Filled 2018-02-22: qty 5

## 2018-02-22 MED ORDER — SODIUM CHLORIDE 0.9 % IV SOLN
INTRAVENOUS | Status: DC
  Administered 2018-02-22: 13:00:00 via INTRAVENOUS

## 2018-02-22 MED ORDER — HEPARIN SOD (PORK) LOCK FLUSH 100 UNIT/ML IV SOLN
INTRAVENOUS | Status: AC | PRN
  Administered 2018-02-22: 500 [IU] via INTRAVENOUS

## 2018-02-22 MED ORDER — FENTANYL CITRATE (PF) 100 MCG/2ML IJ SOLN
INTRAMUSCULAR | Status: AC | PRN
  Administered 2018-02-22 (×2): 50 ug via INTRAVENOUS

## 2018-02-22 MED ORDER — MIDAZOLAM HCL 2 MG/2ML IJ SOLN
INTRAMUSCULAR | Status: AC | PRN
  Administered 2018-02-22 (×2): 1 mg via INTRAVENOUS

## 2018-02-22 NOTE — Progress Notes (Signed)
Met w/ pt to introduce myself as her Financial Resource Specialist.  I offered the [facility] grant, went over what it covers and gave her the income requirement.  Pt is overqualified for the grant at this time.  I gave her my card in case her situation changes and for any questions or concerns she may have in the future.  °

## 2018-02-22 NOTE — Discharge Instructions (Addendum)
Implanted Port Insertion, Care After °This sheet gives you information about how to care for yourself after your procedure. Your health care provider may also give you more specific instructions. If you have problems or questions, contact your health care provider. °What can I expect after the procedure? °After your procedure, it is common to have: °· Discomfort at the port insertion site. °· Bruising on the skin over the port. This should improve over 3-4 days. ° °Follow these instructions at home: °Port care °· After your port is placed, you will get a manufacturer's information card. The card has information about your port. Keep this card with you at all times. °· Take care of the port as told by your health care provider. Ask your health care provider if you or a family member can get training for taking care of the port at home. A home health care nurse may also take care of the port. °· Make sure to remember what type of port you have. °Incision care °· Follow instructions from your health care provider about how to take care of your port insertion site. Make sure you: °? Wash your hands with soap and water before you change your bandage (dressing). If soap and water are not available, use hand sanitizer. °? Change your dressing as told by your health care provider. °? Leave stitches (sutures), skin glue, or adhesive strips in place. These skin closures may need to stay in place for 2 weeks or longer. If adhesive strip edges start to loosen and curl up, you may trim the loose edges. Do not remove adhesive strips completely unless your health care provider tells you to do that. °· Check your port insertion site every day for signs of infection. Check for: °? More redness, swelling, or pain. °? More fluid or blood. °? Warmth. °? Pus or a bad smell. °General instructions °· Do not take baths, swim, or use a hot tub until your health care provider approves. °· Do not lift anything that is heavier than 10 lb (4.5  kg) for a week, or as told by your health care provider. °· Ask your health care provider when it is okay to: °? Return to work or school. °? Resume usual physical activities or sports. °· Do not drive for 24 hours if you were given a medicine to help you relax (sedative). °· Take over-the-counter and prescription medicines only as told by your health care provider. °· Wear a medical alert bracelet in case of an emergency. This will tell any health care providers that you have a port. °· Keep all follow-up visits as told by your health care provider. This is important. °Contact a health care provider if: °· You cannot flush your port with saline as directed, or you cannot draw blood from the port. °· You have a fever or chills. °· You have more redness, swelling, or pain around your port insertion site. °· You have more fluid or blood coming from your port insertion site. °· Your port insertion site feels warm to the touch. °· You have pus or a bad smell coming from the port insertion site. °Get help right away if: °· You have chest pain or shortness of breath. °· You have bleeding from your port that you cannot control. °Summary °· Take care of the port as told by your health care provider. °· Change your dressing as told by your health care provider. °· Keep all follow-up visits as told by your health care provider. °  This information is not intended to replace advice given to you by your health care provider. Make sure you discuss any questions you have with your health care provider. °Document Released: 12/20/2012 Document Revised: 01/21/2016 Document Reviewed: 01/21/2016 °Elsevier Interactive Patient Education © 2017 [employer] °Moderate Conscious Sedation, Adult, Care After °These instructions provide you with information about caring for yourself after your procedure. Your health care provider may also give you more specific instructions. Your treatment has been planned according to current medical  practices, but problems sometimes occur. Call your health care provider if you have any problems or questions after your procedure. °What can I expect after the procedure? °After your procedure, it is common: °· To feel sleepy for several hours. °· To feel clumsy and have poor balance for several hours. °· To have poor judgment for several hours. °· To vomit if you eat too soon. ° °Follow these instructions at home: °For at least 24 hours after the procedure: ° °· Do not: °? Participate in activities where you could fall or become injured. °? Drive. °? Use heavy machinery. °? Drink alcohol. °? Take sleeping pills or medicines that cause drowsiness. °? Make important decisions or sign legal documents. °? Take care of children on your own. °· Rest. °Eating and drinking °· Follow the diet recommended by your health care provider. °· If you vomit: °? Drink water, juice, or soup when you can drink without vomiting. °? Make sure you have little or no nausea before eating solid foods. °General instructions °· Have a responsible adult stay with you until you are awake and alert. °· Take over-the-counter and prescription medicines only as told by your health care provider. °· If you smoke, do not smoke without supervision. °· Keep all follow-up visits as told by your health care provider. This is important. °Contact a health care provider if: °· You keep feeling nauseous or you keep vomiting. °· You feel light-headed. °· You develop a rash. °· You have a fever. °Get help right away if: °· You have trouble breathing. °This information is not intended to replace advice given to you by your health care provider. Make sure you discuss any questions you have with your health care provider. °Document Released: 12/20/2012 Document Revised: 08/04/2015 Document Reviewed: 06/21/2015 °Elsevier Interactive Patient Education © 2018 [employer] ° °

## 2018-02-22 NOTE — Sedation Documentation (Signed)
Patient is resting comfortably with eyes closed in NAD. 

## 2018-02-22 NOTE — Procedures (Signed)
Interventional Radiology Procedure Note  Procedure: Single Lumen Power Port Placement    Access:  Right IJ vein.  Findings: Catheter tip positioned at SVC/RA junction. Port is ready for immediate use.   Complications: None  EBL: < 10 mL  Recommendations:  - Ok to shower in 24 hours - Do not submerge for 7 days - Routine line care   Heberto [place] T. Monserrat Vidaurri, M.D Pager:  [phone]   

## 2018-02-22 NOTE — Consult Note (Signed)
Chief Complaint: Patient was seen in consultation today for Port-A-Cath placement  Referring Physician(s): Feng,Yan  Supervising Physician: Aletta Edouard  Patient Status: [facility] - Out-pt  History of Present Illness: Kristin Williams is a 72 y.o. female smoker  with history of left breast cancer diagnosed in 2004, right breast cancer diagnosed on 01/10/2018 and now with newly diagnosed stage IV esophageal cancer.  She presents today for Port-A-Cath placement for chemotherapy.  Past Medical History:  Diagnosis Date  . Arthritis   . Cancer (HCC)    Cervical  . Cancer ([place])    Breast  . Cancer ([place])    Vaginal  . Complication of anesthesia   . COPD (chronic obstructive pulmonary disease) ([place])   . Headache(784.0)   . Hyperlipidemia   . Leg pain   . Peripheral vascular disease ([place])   . Personal history of chemotherapy   . Personal history of radiation therapy   . PONV (postoperative nausea and vomiting)     Past Surgical History:  Procedure Laterality Date  . ABDOMINAL HYSTERECTOMY  1975  . BREAST LUMPECTOMY  2005  . GANGLION CYST EXCISION  1977  . ILIAC ARTERY STENT  04/20/2004   CSD right iliac occlusive disease    Allergies: Flonase [fluticasone propionate]  Medications: Prior to Admission medications   Medication Sig Start Date End Date Taking? Authorizing Provider  acetaminophen (TYLENOL) 160 MG/5ML liquid Take 325 mg by mouth every 4 (four) hours as needed for fever.   Yes [provider]  dexamethasone (DECADRON) 4 MG tablet Take 1 tablet (4 mg total) by mouth every 12 (twelve) hours. Patient taking differently: Take 4 mg by mouth daily.  01/31/18  Yes Vonzella Nipple, NP  LORazepam (ATIVAN) 1 MG tablet Take 1 tablet (1 mg total) by mouth 3 (three) times daily as needed for anxiety. 01/08/18  Yes Nat Christen, MD  metoCLOPramide (REGLAN) 10 MG tablet Take 1 tablet (10 mg total) by mouth every 6 (six) hours as needed for nausea. 01/31/18  Yes  Vonzella Nipple, NP  omeprazole (PRILOSEC) 20 MG capsule Take 40 mg by mouth daily.  09/09/10  Yes [provider]     Family History  Problem Relation Age of Onset  . Cancer Mother 27       Non-Hodgkins Disease T-Cell  . Heart failure Father   . COPD Father   . Heart disease Brother 45       MI and Heart Disease before age 79  . Heart attack Brother   . Lung cancer Maternal Uncle   . Cancer Cousin   . Prostate cancer Maternal Uncle   . Prostate cancer Brother        half brother  . Breast cancer Neg Hx     Social History   Socioeconomic History  . Marital status: Married    Spouse name: Not on file  . Number of children: 2  . Years of education: Not on file  . Highest education level: Not on file  Occupational History  . Not on file  Social Needs  . Financial resource strain: Not on file  . Food insecurity:    Worry: Not on file    Inability: Not on file  . Transportation needs:    Medical: Not on file    Non-medical: Not on file  Tobacco Use  . Smoking status: Former Smoker    Packs/day: 0.50    Years: 40.00    Pack years: 20.00  Types: Cigarettes    Last attempt to quit: 01/27/2018    Years since quitting: 0.0  . Smokeless tobacco: Never Used  Substance and Sexual Activity  . Alcohol use: No  . Drug use: No  . Sexual activity: Not Currently  Lifestyle  . Physical activity:    Days per week: Not on file    Minutes per session: Not on file  . Stress: Not on file  Relationships  . Social connections:    Talks on phone: Not on file    Gets together: Not on file    Attends religious service: Not on file    Active member of club or organization: Not on file    Attends meetings of clubs or organizations: Not on file    Relationship status: Not on file  Other Topics Concern  . Not on file  Social History Narrative   Resides in [place]. Resides with her husband.       Review of Systems she denies fever, headache, chest pain,  worsening dyspnea, abdominal pain, nausea, vomiting or bleeding.  She does have occ cough, back pain and dysphagia.  Vital Signs: BP 121/71   Pulse 67   Temp 98.3 F (36.8 C) (Oral)   Resp 16   LMP 02/17/2018   SpO2 99%   Physical Exam awake, alert.  Chest with distant breath sounds bilaterally.  Heart with regular rate and rhythm.  Abdomen soft, obese, positive bowel sounds, nontender. No LE edema.  Imaging: Ct Head Wo Contrast  Result Date: 02/17/2018 CLINICAL DATA:  Brain tumor being treated radiation. Imbalance. EXAM: CT HEAD WITHOUT CONTRAST CT MAXILLOFACIAL WITHOUT CONTRAST TECHNIQUE: Multidetector CT imaging of the head and maxillofacial structures were performed using the standard protocol without intravenous contrast. Multiplanar CT image reconstructions of the maxillofacial structures were also generated. COMPARISON:  MRI 01/31/2018 and CT 01/26/2018 FINDINGS: CT HEAD FINDINGS Brain: Redemonstration of a calcified intra-axial mass involving the right cerebellar hemisphere with slight extrinsic mass effect on the adjacent fourth ventricle and vermis from the right. This currently measures 2.5 x 2.8 x 2.6 cm. No additional lesions are noted. Cerebral atrophy with minimal small vessel ischemic disease in the supratentorial compartment is noted. No hydrocephalus is seen. No extra-axial fluid. Vascular: No hyperdense vessel sign. Skull: Intact Other: None CT MAXILLOFACIAL FINDINGS Osseous: Intact maxillofacial bones. No aggressive osseous lesions. Orbits: Bilateral lens replacements. Symmetric appearance of the globes. No retrobulbar abnormality. Sinuses: Mild ethmoid sinus mucosal thickening. The remainder of the paranasal sinuses are clear. Soft tissues: Left periorbital soft tissue laceration with contusion/swelling. IMPRESSION: 1. 2.5 x 2.8 x 2.6 cm calcified intra-axial mass involving the right cerebellar hemisphere with slight extrinsic mass effect on the adjacent fourth ventricle and  vermis from the right. This is stable in appearance consistent solitary metastasis. 2. Left periorbital soft tissue laceration with soft tissue swelling about without underlying fracture. 3. Cerebral atrophy with minimal small vessel ischemic disease. Electronically Signed   By: Ashley Royalty M.D.   On: 02/17/2018 23:37   Ct Head W & Wo Contrast  Addendum Date: 01/27/2018   ADDENDUM REPORT: 01/27/2018 01:36 ADDENDUM: Critical Value/emergent results were called by telephone at the time of interpretation on 01/27/2018 at 1:30 am to Dr. Anson Fret , who verbally acknowledged these results. Electronically Signed   By: Elon Alas M.D.   On: 01/27/2018 01:36   Result Date: 01/27/2018 CLINICAL DATA:  Vertigo and dizziness for 2 months. History of breast cancer and esophageal mass. EXAM: CT  HEAD WITHOUT AND WITH CONTRAST TECHNIQUE: Contiguous axial images were obtained from the base of the skull through the vertex without and with intravenous contrast CONTRAST:  26mL ISOVUE-300 IOPAMIDOL (ISOVUE-300) INJECTION 61% COMPARISON:  CT HEAD December 06, 2003 FINDINGS: BRAIN: 2.6 x 2.6 x 3 cm (volume = 10 cc) RIGHT parasagittal cerebellar hemorrhage with surrounding low-density vasogenic edema. Mild mass effect on fourth ventricle without hydrocephalus. No parenchymal brain volume loss for age. No abnormal intracranial enhancement. LEFT cerebellar developmental venous anomaly. No acute large vascular territory infarct. No abnormal extra-axial fluid collections. Basal cisterns are patent. VASCULAR: Unremarkable. SKULL: No skull fracture. No destructive bony lesions. 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 No significant scalp soft tissue swelling. SINUSES/ORBITS: Trace paranasal sinus mucosal thickening. Mastoid air cells are well aerated.The included ocular globes and orbital contents are non-suspicious. OTHER: None. IMPRESSION: 1. Acute 10 cc cerebellar hemorrhage with mild mass effect on  fourth ventricle, no hydrocephalus. 2. Otherwise negative CT HEAD with and without contrast. 3. Ordering clinician has been paged at time of study interpretation, awaiting return call. Electronically Signed: By: Elon Alas M.D. On: 01/27/2018 01:24   Ct Chest W Contrast  Result Date: 01/27/2018 CLINICAL DATA:  Tumor of the esophagus. EXAM: CT CHEST WITH CONTRAST TECHNIQUE: Multidetector CT imaging of the chest was performed during intravenous contrast administration. CONTRAST:  67mL ISOVUE-300 IOPAMIDOL (ISOVUE-300) INJECTION 61% COMPARISON:  01/19/2017 FINDINGS: Cardiovascular: The heart size appears normal. Aortic atherosclerosis. Calcifications within the LAD, left circumflex and RCA coronary arteries noted. No pericardial effusion. Mediastinum/Nodes: Normal appearance of the thyroid gland. The trachea appears patent and is midline. There is masslike thickening involving the distal half of the esophagus. The tumor spans approximately 11.7 cm beginning at the T5 level and extending to just above a small hiatal hernia. The single wall thickness of the esophagus measures up to 1.5 cm. No axillary or supraclavicular adenopathy. High right paratracheal lymph node is new from 01/15/2016 measuring 8 mm. Lymph node between the esophagus and descending thoracic aorta measures 1.2 cm, image 114/2. Lungs/Pleura: No pleural effusion. Mild changes of emphysema. Right lower lobe lung nodule measures 5 mm, image 101/5. Also stable from 01/15/2016. No new or enlarging pulmonary nodules. 1.3 cm right middle lobe lung nodule is identified, image 93/5. Unchanged from 01/15/2016. Upper Abdomen: No acute abnormality identified. Small hiatal hernia. Unchanged left adrenal nodules. Musculoskeletal: No chest wall abnormality. No acute or significant osseous findings. Post treatment changes within the medial left breast noted. IMPRESSION: 1. Large mass involving the distal half of the esophagus spanning approximately 11.7 cm  is identified compatible with primary esophageal neoplasm. 2. Small posterior mediastinal and high right paratracheal lymph nodes are noted which may represent foci of metastatic adenopathy. 3. The small pulmonary nodules in the right lung are unchanged from 01/15/2016 and are favored to represent a benign process. 4. Aortic Atherosclerosis (ICD10-I70.0) and Emphysema (ICD10-J43.9). 5. Coronary artery atherosclerotic calcifications. Electronically Signed   By: Kerby Moors M.D.   On: 01/27/2018 11:23   Mr Jeri Cos KV Contrast  Result Date: 01/31/2018 CLINICAL DATA:  Vertigo and vomiting over the last 6 weeks. Weight loss. Esophageal cancer. Cerebellar metastasis. EXAM: MRI HEAD WITHOUT AND WITH CONTRAST TECHNIQUE: Multiplanar, multiecho pulse sequences of the brain and surrounding structures were obtained without and with intravenous contrast. CONTRAST:  10 cc Gadavist COMPARISON:  01/27/2018 FINDINGS: Brain: No additional lesions are disclosed. There is a 2.9 x 2.6 x 3.0 cm metastasis in the right superior cerebellum with  somewhat lobular contours and mild surrounding vasogenic edema. The lesion is uniformly hyperdense at CT in shows fairly uniform low T2 signal on MRI, favoring psammomatous calcification rather than hemorrhage. There are probably some minor petechial hemorrhagic components. Elsewhere, there are mild to moderate chronic small-vessel ischemic changes of the cerebral hemispheric white matter. No hydrocephalus or extra-axial collection. Vascular: Major vessels at the base of the brain show flow. Skull and upper cervical spine: Negative Sinuses/Orbits: Clear/normal Other: None IMPRESSION: 3 Tesla study does not disclose any additional lesions. Solitary metastasis in the right superior cerebellum measuring 2.9 x 2.6 x 3.0 cm with mild surrounding edema. 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 Electronically Signed   By: Nelson Chimes M.D.   On: 01/31/2018 06:33   Mr Jeri Cos KD Contrast  Result  Date: 01/27/2018 CLINICAL DATA:  Vertigo for 2 weeks. Cerebellar hemorrhage on CT. Esophageal tumor. EXAM: MRI HEAD WITHOUT AND WITH CONTRAST TECHNIQUE: Multiplanar, multiecho pulse sequences of the brain and surrounding structures were obtained without and with intravenous contrast. CONTRAST:  9.5 mL Gadavist COMPARISON:  Head CT 01/26/2018 FINDINGS: Brain: A right parasagittal cerebellar hemorrhage has not significantly changed in size from yesterday CT measuring 2.9 x 2.7 x 2.5 cm. The hemorrhage is heterogeneously T1 hyperintense and T2 hypointense, and there is associated enhancement including nodular foci of internal enhancement. There is mild surrounding vasogenic edema with partial effacement of the fourth ventricle but no obstructive hydrocephalus. No other enhancing intracranial lesions are identified. There is no evidence of acute infarct or extra-axial fluid collection. Cerebral volume is normal for age. Scattered small foci of T2 hyperintensity in the cerebral white matter nonspecific but compatible with minimal chronic small vessel ischemic disease. Vascular: Major intracranial vascular flow voids are preserved. Skull and upper cervical spine: Unremarkable bone marrow signal. Sinuses/Orbits: Bilateral cataract extraction. Opacification of a single left ethmoid air cell. Clear mastoid air cells. Other: None. IMPRESSION: 2.9 cm hemorrhagic cerebellar mass most consistent with solitary brain metastasis. 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 Electronically Signed   By: Logan Bores M.D.   On: 01/27/2018 14:27   US Abdomen Complete  Result Date: 01/25/2018 CLINICAL DATA:  Abdominal pain with nausea and vomiting EXAM: ABDOMEN ULTRASOUND COMPLETE COMPARISON:  None. FINDINGS: Gallbladder: No gallstones or wall thickening visualized. There is no pericholecystic fluid. No sonographic Murphy sign noted by sonographer. Common bile duct: Diameter: 4 mm. No intrahepatic, common hepatic, or  common bile duct dilatation. Liver: No focal lesion identified. Within normal limits in parenchymal echogenicity. Portal vein is patent on color Doppler imaging with normal direction of blood flow towards the liver. IVC: No abnormality visualized. Pancreas: Visualized portion unremarkable. Portions of pancreatic tail obscured by gas. Spleen: Size and appearance within normal limits. Right Kidney: Length: 11.5 cm. Echogenicity within normal limits. No mass or hydronephrosis visualized. Left Kidney: Length: 11.2 cm. Echogenicity within normal limits. No hydronephrosis visualized. There is a cyst arising from the lower pole left kidney measuring 1.8 x 1.5 x 1.6 cm. Abdominal aorta: No aneurysm visualized. Other findings: No demonstrable ascites. IMPRESSION: 1. Portions of pancreas obscured by gas. Visualized portions of pancreas appear normal. 2.  Cyst arising from lower pole left kidney. 3.  Study otherwise unremarkable. Electronically Signed   By: Lowella Grip III M.D.   On: 01/25/2018 10:22   Nm Pet Image Initial (pi) Skull Base To Thigh  Result Date: 02/13/2018 CLINICAL DATA:  Initial treatment strategy for esophageal cancer. EXAM: NUCLEAR MEDICINE PET SKULL BASE TO THIGH TECHNIQUE: 11.1 mCi  F-18 FDG was injected intravenously. Full-ring PET imaging was performed from the skull base to thigh after the radiotracer. CT data was obtained and used for attenuation correction and anatomic localization. Fasting blood glucose: 109 mg/dl COMPARISON:  CT scans of 01/26/2018 and 01/08/2018 FINDINGS: Mediastinal blood pool activity: SUV max 3.0 NECK: The known right cerebellar vermian metastatic lesion is hypoactive compared to the surrounding brain activity. A right level IV lymph node measures 0.6 cm in short axis on image 50/4 and has a maximum SUV of 2.8. Incidental CT findings: There is opacification of a single left ethmoid air cell. Atherosclerotic calcification of both common carotid arteries. CHEST: A long  esophageal mass extends from the level of the aortic arch down to the distal esophagus and is diffusely hypermetabolic with maximum SUV 22.4 A right upper paratracheal node measuring 0.8 cm in short axis on image 57/4 has maximum SUV of 3.2. The right middle lobe pulmonary nodule measuring 1.1 by 1.0 cm on image 86/4 has a maximum SUV of 2.6. Incidental CT findings: Azygos fissure noted. Dense calcification medially in the left breast without hypermetabolic activity. ABDOMEN/PELVIS: Scattered activity in bowel is likely physiologic. There is some speckled heterogeneity in the liver, for example in the lateral segment left hepatic lobe where maximum SUV is up to 4.3, is post other portions of the liver where the maximum SUV is more in the 3.5 range. There is no supportive CT data of a liver lesion, and overall I suspect that the slight heterogeneity is incidental. Incidental CT findings: Aortoiliac atherosclerotic vascular disease. Mildly contracted gallbladder. Descending and sigmoid colon diverticulosis. Small amount of gas in the urinary bladder, possibly from recent catheterization, correlate with patient history. SKELETON: No significant abnormal hypermetabolic activity in this region. Incidental CT findings: Lower lumbar degenerative facet arthropathy. Sclerosis along the pubis. Old deformity along the left inferior pubic ramus likely from a prior fracture. IMPRESSION: 1. A 15.7 cm in length esophageal mass extends from the aortic arch level down to the distal esophagus, maximum SUV 22.4, compatible with malignancy. There is a small right lower neck level IV lymph node with lower than mediastinal blood pool activity, and a right upper paratracheal lymph node with just above mediastinal blood pool activity. 2. Faint speckled heterogeneity in the liver, for example in the lateral segment left hepatic lobe, is likely incidental/benign given the lack of correlate of CT finding. It might be prudent to obtain an MRI  just to make sure that there is not a small early metastatic lesion in this vicinity. 3. Known right cerebellar vermian metastatic lesion, with expected hypo activity compared to the surrounding brain activity. 4. The right middle lobe pulmonary nodule measures 1.1 by 1.0 cm and is mildly hypermetabolic with maximum SUV 2.6. This lesion measured 6 by 5 mm back on 12/06/2003 and 1.2 by 1.1 cm on 10/15/2015. I am uncertain what to make of the very slow growth and mildly accentuated metabolic activity. This could be a chronic granulomatous process. Low-grade adenocarcinoma of the lung seems unlikely to be so indolent as to only mildly increase over a 14 year period, but is presumably a differential diagnostic consideration. Surveillance is suggested. 5. Other imaging findings of potential clinical significance: Aortic Atherosclerosis (ICD10-I70.0). Descending and sigmoid colon diverticulosis. Gas in the urinary bladder, most common cause would be recent catheterization. Minimal chronic left ethmoid sinusitis. Electronically Signed   By: Van Clines M.D.   On: 02/13/2018 17:34   Ct Maxillofacial Wo Cm  Result Date:  02/17/2018 CLINICAL DATA:  Brain tumor being treated radiation. Imbalance. EXAM: CT HEAD WITHOUT CONTRAST CT MAXILLOFACIAL WITHOUT CONTRAST TECHNIQUE: Multidetector CT imaging of the head and maxillofacial structures were performed using the standard protocol without intravenous contrast. Multiplanar CT image reconstructions of the maxillofacial structures were also generated. COMPARISON:  MRI 01/31/2018 and CT 01/26/2018 FINDINGS: CT HEAD FINDINGS Brain: Redemonstration of a calcified intra-axial mass involving the right cerebellar hemisphere with slight extrinsic mass effect on the adjacent fourth ventricle and vermis from the right. This currently measures 2.5 x 2.8 x 2.6 cm. No additional lesions are noted. Cerebral atrophy with minimal small vessel ischemic disease in the supratentorial  compartment is noted. No hydrocephalus is seen. No extra-axial fluid. Vascular: No hyperdense vessel sign. Skull: Intact Other: None CT MAXILLOFACIAL FINDINGS Osseous: Intact maxillofacial bones. No aggressive osseous lesions. Orbits: Bilateral lens replacements. Symmetric appearance of the globes. No retrobulbar abnormality. Sinuses: Mild ethmoid sinus mucosal thickening. The remainder of the paranasal sinuses are clear. Soft tissues: Left periorbital soft tissue laceration with contusion/swelling. IMPRESSION: 1. 2.5 x 2.8 x 2.6 cm calcified intra-axial mass involving the right cerebellar hemisphere with slight extrinsic mass effect on the adjacent fourth ventricle and vermis from the right. This is stable in appearance consistent solitary metastasis. 2. Left periorbital soft tissue laceration with soft tissue swelling about without underlying fracture. 3. Cerebral atrophy with minimal small vessel ischemic disease. Electronically Signed   By: Ashley Royalty M.D.   On: 02/17/2018 23:37    Labs:  CBC: Recent Labs    01/28/18 0501 01/29/18 0457 01/30/18 0618 02/16/18 1230  WBC 6.1 11.8* 9.8 15.9*  HGB 13.5 12.7 12.3 13.9  HCT 42.5 38.9 38.4 41.7  PLT 329 308 263 123*    COAGS: Recent Labs    01/27/18 0253  INR 0.93    BMP: Recent Labs    01/28/18 0501 01/29/18 0457 01/30/18 0618 02/16/18 1230  NA 136 137 136 132*  K 4.9 4.1 4.4 4.9  CL 102 104 107 100  CO2 28 23 24  21*  GLUCOSE 159* 138* 135* 106*  BUN 8 14 18  27*  CALCIUM 9.9 9.7 8.9 8.7*  CREATININE 1.03* 1.03* 0.97 0.90  GFRNONAA 54* 54* 58* >60  GFRAA >60 >60 >60 >60    LIVER FUNCTION TESTS: Recent Labs    01/08/18 1548 01/27/18 0253 02/16/18 1230  BILITOT 0.6 0.5 0.5  AST 18 16 37  ALT 21 17 156*  ALKPHOS 80 92 89  PROT 7.2 6.6 6.1*  ALBUMIN 3.8 3.4* 3.1*    TUMOR MARKERS: No results for input(s): AFPTM, CEA, CA199, CHROMGRNA in the last 8760 hours.  Assessment and Plan: 72 y.o. female smoker  with  history of left breast cancer diagnosed in 2004, right breast cancer diagnosed on 01/10/2018 and now with newly diagnosed stage IV esophageal cancer.  She presents today for Port-A-Cath placement for chemotherapy.Risks and benefits of image guided port-a-catheter placement was discussed with the patient including, but not limited to bleeding, infection, pneumothorax, or fibrin sheath development and need for additional procedures.  All of the patient's questions were answered, patient is agreeable to proceed. Consent signed and in chart.  LABS PENDING   Thank you for this interesting consult.  I greatly enjoyed meeting Kristin Williams and look forward to participating in their care.  A copy of this report was sent to the requesting provider on this date.  Electronically Signed: D. Rowe Robert, PA-C 02/22/2018, 1:30 PM   I spent a  total of  20 minutes   in face to face in clinical consultation, greater than 50% of which was counseling/coordinating care for Port-A-Cath placement

## 2018-02-24 DIAGNOSIS — Z853 Personal history of malignant neoplasm of breast: Secondary | ICD-10-CM | POA: Diagnosis not present

## 2018-02-24 DIAGNOSIS — Z510 Encounter for antineoplastic radiation therapy: Secondary | ICD-10-CM | POA: Diagnosis not present

## 2018-02-24 DIAGNOSIS — C7931 Secondary malignant neoplasm of brain: Secondary | ICD-10-CM | POA: Diagnosis not present

## 2018-02-24 DIAGNOSIS — C154 Malignant neoplasm of middle third of esophagus: Secondary | ICD-10-CM | POA: Diagnosis not present

## 2018-02-24 DIAGNOSIS — C159 Malignant neoplasm of esophagus, unspecified: Secondary | ICD-10-CM | POA: Diagnosis not present

## 2018-02-27 ENCOUNTER — Ambulatory Visit
Admission: RE | Admit: 2018-02-27 | Discharge: 2018-02-27 | Disposition: A | Discharge status: Home / Self Care | Payer: Medicare Other | Source: Ambulatory Visit | Attending: Radiation Oncology | Admitting: Radiation Oncology

## 2018-02-27 ENCOUNTER — Encounter: Payer: Self-pay | Admitting: Oncology

## 2018-02-27 ENCOUNTER — Other Ambulatory Visit: Payer: Self-pay | Admitting: Hematology

## 2018-02-27 ENCOUNTER — Inpatient Hospital Stay (HOSPITAL_BASED_OUTPATIENT_CLINIC_OR_DEPARTMENT_OTHER): Payer: Medicare Other | Admitting: Oncology

## 2018-02-27 ENCOUNTER — Inpatient Hospital Stay: Payer: Medicare Other

## 2018-02-27 ENCOUNTER — Telehealth: Payer: Self-pay | Admitting: Oncology

## 2018-02-27 ENCOUNTER — Telehealth: Payer: Self-pay | Admitting: *Deleted

## 2018-02-27 VITALS — BP 144/57 | HR 67 | Resp 16

## 2018-02-27 VITALS — BP 124/58 | HR 77 | Temp 97.7°F | Resp 17 | Ht 60.0 in | Wt 207.8 lb

## 2018-02-27 DIAGNOSIS — C154 Malignant neoplasm of middle third of esophagus: Secondary | ICD-10-CM | POA: Diagnosis not present

## 2018-02-27 DIAGNOSIS — D696 Thrombocytopenia, unspecified: Secondary | ICD-10-CM

## 2018-02-27 DIAGNOSIS — C159 Malignant neoplasm of esophagus, unspecified: Secondary | ICD-10-CM | POA: Diagnosis not present

## 2018-02-27 DIAGNOSIS — Z5111 Encounter for antineoplastic chemotherapy: Secondary | ICD-10-CM | POA: Diagnosis not present

## 2018-02-27 DIAGNOSIS — R600 Localized edema: Secondary | ICD-10-CM | POA: Diagnosis not present

## 2018-02-27 DIAGNOSIS — Z510 Encounter for antineoplastic radiation therapy: Secondary | ICD-10-CM | POA: Diagnosis not present

## 2018-02-27 DIAGNOSIS — Z9181 History of falling: Secondary | ICD-10-CM | POA: Diagnosis not present

## 2018-02-27 DIAGNOSIS — M7989 Other specified soft tissue disorders: Secondary | ICD-10-CM | POA: Insufficient documentation

## 2018-02-27 DIAGNOSIS — Z170 Estrogen receptor positive status [ER+]: Secondary | ICD-10-CM | POA: Diagnosis not present

## 2018-02-27 DIAGNOSIS — C7931 Secondary malignant neoplasm of brain: Secondary | ICD-10-CM | POA: Diagnosis not present

## 2018-02-27 DIAGNOSIS — E46 Unspecified protein-calorie malnutrition: Secondary | ICD-10-CM

## 2018-02-27 DIAGNOSIS — C50911 Malignant neoplasm of unspecified site of right female breast: Secondary | ICD-10-CM | POA: Diagnosis not present

## 2018-02-27 DIAGNOSIS — Z853 Personal history of malignant neoplasm of breast: Secondary | ICD-10-CM | POA: Diagnosis not present

## 2018-02-27 DIAGNOSIS — G629 Polyneuropathy, unspecified: Secondary | ICD-10-CM | POA: Diagnosis not present

## 2018-02-27 LAB — CBC WITH DIFFERENTIAL (CANCER CENTER ONLY)
Abs Immature Granulocytes: 0.23 10*3/uL — ABNORMAL HIGH (ref 0.00–0.07)
BASOS ABS: 0 10*3/uL (ref 0.0–0.1)
Basophils Relative: 0 %
Eosinophils Absolute: 0 10*3/uL (ref 0.0–0.5)
Eosinophils Relative: 0 %
HEMATOCRIT: 38.6 % (ref 36.0–46.0)
Hemoglobin: 12.8 g/dL (ref 12.0–15.0)
Immature Granulocytes: 3 %
Lymphocytes Relative: 6 %
Lymphs Abs: 0.5 10*3/uL — ABNORMAL LOW (ref 0.7–4.0)
MCH: 30.4 pg (ref 26.0–34.0)
MCHC: 33.2 g/dL (ref 30.0–36.0)
MCV: 91.7 fL (ref 80.0–100.0)
Monocytes Absolute: 0.3 10*3/uL (ref 0.1–1.0)
Monocytes Relative: 4 %
NEUTROS PCT: 87 %
Neutro Abs: 6.7 10*3/uL (ref 1.7–7.7)
Platelet Count: 102 10*3/uL — ABNORMAL LOW (ref 150–400)
RBC: 4.21 MIL/uL (ref 3.87–5.11)
RDW: 14.6 % (ref 11.5–15.5)
WBC Count: 7.8 10*3/uL (ref 4.0–10.5)
nRBC: 0 % (ref 0.0–0.2)

## 2018-02-27 LAB — CMP (CANCER CENTER ONLY)
ALT: 54 U/L — ABNORMAL HIGH (ref 0–44)
AST: 14 U/L — ABNORMAL LOW (ref 15–41)
Albumin: 2.9 g/dL — ABNORMAL LOW (ref 3.5–5.0)
Alkaline Phosphatase: 87 U/L (ref 38–126)
Anion gap: 10 (ref 5–15)
BUN: 14 mg/dL (ref 8–23)
CO2: 25 mmol/L (ref 22–32)
Calcium: 8.6 mg/dL — ABNORMAL LOW (ref 8.9–10.3)
Chloride: 100 mmol/L (ref 98–111)
Creatinine: 0.71 mg/dL (ref 0.44–1.00)
GFR, Est AFR Am: >60 mL/min (ref >60)
GFR, Estimated: >60 mL/min (ref >60)
Glucose, Bld: 131 mg/dL — ABNORMAL HIGH (ref 70–99)
POTASSIUM: 4.6 mmol/L (ref 3.5–5.1)
Sodium: 135 mmol/L (ref 135–145)
TOTAL PROTEIN: 5.9 g/dL — AB (ref 6.5–8.1)
Total Bilirubin: 0.5 mg/dL (ref 0.3–1.2)

## 2018-02-27 MED ORDER — SODIUM CHLORIDE 0.9 % IV SOLN
20.0000 mg | Freq: Once | INTRAVENOUS | Status: AC
  Administered 2018-02-27: 20 mg via INTRAVENOUS
  Filled 2018-02-27: qty 2

## 2018-02-27 MED ORDER — ONDANSETRON HCL 8 MG PO TABS: 8.0000 mg | ORAL_TABLET | Freq: Three times a day (TID) | ORAL | 0 refills | Status: DC | PRN

## 2018-02-27 MED ORDER — SODIUM CHLORIDE 0.9 % IV SOLN
50.0000 mg/m2 | Freq: Once | INTRAVENOUS | Status: AC
  Administered 2018-02-27: 102 mg via INTRAVENOUS
  Filled 2018-02-27: qty 17

## 2018-02-27 MED ORDER — PALONOSETRON HCL INJECTION 0.25 MG/5ML
0.2500 mg | Freq: Once | INTRAVENOUS | Status: AC
  Administered 2018-02-27: 0.25 mg via INTRAVENOUS

## 2018-02-27 MED ORDER — SODIUM CHLORIDE 0.9 % IV SOLN
Freq: Once | INTRAVENOUS | Status: AC
  Administered 2018-02-27: 15:00:00 via INTRAVENOUS
  Filled 2018-02-27: qty 250

## 2018-02-27 MED ORDER — SODIUM CHLORIDE 0.9 % IV SOLN
209.0000 mg | Freq: Once | INTRAVENOUS | Status: AC
  Administered 2018-02-27: 210 mg via INTRAVENOUS
  Filled 2018-02-27: qty 21

## 2018-02-27 MED ORDER — FAMOTIDINE IN NACL 20-0.9 MG/50ML-% IV SOLN
20.0000 mg | Freq: Once | INTRAVENOUS | Status: AC
  Administered 2018-02-27: 20 mg via INTRAVENOUS

## 2018-02-27 MED ORDER — PROCHLORPERAZINE MALEATE 10 MG PO TABS: 10.0000 mg | ORAL_TABLET | Freq: Four times a day (QID) | ORAL | 0 refills | Status: DC | PRN

## 2018-02-27 MED ORDER — SODIUM CHLORIDE 0.9% FLUSH
10.0000 mL | INTRAVENOUS | Status: DC | PRN
  Administered 2018-02-27: 10 mL
  Filled 2018-02-27: qty 10

## 2018-02-27 MED ORDER — LIDOCAINE-PRILOCAINE 2.5-2.5 % EX CREA: 1.0000 "application " | TOPICAL_CREAM | CUTANEOUS | 1 refills | Status: AC | PRN

## 2018-02-27 MED ORDER — DIPHENHYDRAMINE HCL 50 MG/ML IJ SOLN
50.0000 mg | Freq: Once | INTRAMUSCULAR | Status: AC
  Administered 2018-02-27: 50 mg via INTRAVENOUS

## 2018-02-27 MED ORDER — HEPARIN SOD (PORK) LOCK FLUSH 100 UNIT/ML IV SOLN
500.0000 [IU] | Freq: Once | INTRAVENOUS | Status: AC | PRN
  Administered 2018-02-27: 500 [IU]
  Filled 2018-02-27: qty 5

## 2018-02-27 NOTE — Assessment & Plan Note (Signed)
The patient is status post stereotactic radiation to the brain.  Her slurred speech and balance stability have continued to improve.  She is slowly tapering off her dexamethasone.  She is currently on dexamethasone 4 mg daily and will decrease to 2 mg daily later this week and then discontinue on 03/08/2018.

## 2018-02-27 NOTE — Assessment & Plan Note (Signed)
The patient has left lower extremity edema.  We will obtain a Doppler ultrasound within the next 24 to 48 hours to evaluate for DVT.  Will consider anticoagulation if DVT is present.

## 2018-02-27 NOTE — Telephone Encounter (Signed)
Scheduled appt per 12/16 los - per [facility] dates are suppose to be 12/30 and 1/20 - unable to scheduel 12/30 due to cap - added on log - will contact patient when scheduled

## 2018-02-27 NOTE — Patient Instructions (Signed)
[name] Discharge Instructions for Patients Receiving Chemotherapy  Today you received the following chemotherapy agents Paclitaxel (TAXOL) & Carboplatin (PARAPLATIN).  To help prevent nausea and vomiting after your treatment, we encourage you to take your nausea medication as prescribed.   If you develop nausea and vomiting that is not controlled by your nausea medication, call the clinic.   BELOW ARE SYMPTOMS THAT SHOULD BE REPORTED IMMEDIATELY:  *FEVER GREATER THAN 100.5 F  *CHILLS WITH OR WITHOUT FEVER  NAUSEA AND VOMITING THAT IS NOT CONTROLLED WITH YOUR NAUSEA MEDICATION  *UNUSUAL SHORTNESS OF BREATH  *UNUSUAL BRUISING OR BLEEDING  TENDERNESS IN MOUTH AND THROAT WITH OR WITHOUT PRESENCE OF ULCERS  *URINARY PROBLEMS  *BOWEL PROBLEMS  UNUSUAL RASH Items with * indicate a potential emergency and should be followed up as soon as possible.  Feel free to call the clinic should you have any questions or concerns. The clinic phone number is [phone].  Please show the [facility] at check-in to the Emergency Department and triage nurse.  Paclitaxel injection What is this medicine? PACLITAXEL (PAK li TAX el) is a chemotherapy drug. It targets fast dividing cells, like cancer cells, and causes these cells to die. This medicine is used to treat ovarian cancer, breast cancer, and other cancers. This medicine may be used for other purposes; ask your health care provider or pharmacist if you have questions. COMMON BRAND NAME(S): Onxol, Taxol What should I tell my health care provider before I take this medicine? They need to know if you have any of these conditions: -blood disorders -irregular heartbeat -infection (especially a virus infection such as chickenpox, cold sores, or herpes) -liver disease -previous or ongoing radiation therapy -an unusual or allergic reaction to paclitaxel, alcohol, polyoxyethylated castor oil, other chemotherapy agents,  other medicines, foods, dyes, or preservatives -pregnant or trying to get pregnant -breast-feeding How should I use this medicine? This drug is given as an infusion into a vein. It is administered in a hospital or clinic by a specially trained health care professional. Talk to your pediatrician regarding the use of this medicine in children. Special care may be needed. Overdosage: If you think you have taken too much of this medicine contact a poison control center or emergency room at once. NOTE: This medicine is only for you. Do not share this medicine with others. What if I miss a dose? It is important not to miss your dose. Call your doctor or health care professional if you are unable to keep an appointment. What may interact with this medicine? Do not take this medicine with any of the following medications: -disulfiram -metronidazole This medicine may also interact with the following medications: -cyclosporine -diazepam -ketoconazole -medicines to increase blood counts like filgrastim, pegfilgrastim, sargramostim -other chemotherapy drugs like cisplatin, doxorubicin, epirubicin, etoposide, teniposide, vincristine -quinidine -testosterone -vaccines -verapamil Talk to your doctor or health care professional before taking any of these medicines: -acetaminophen -aspirin -ibuprofen -ketoprofen -naproxen This list may not describe all possible interactions. Give your health care provider a list of all the medicines, herbs, non-prescription drugs, or dietary supplements you use. Also tell them if you smoke, drink alcohol, or use illegal drugs. Some items may interact with your medicine. What should I watch for while using this medicine? Your condition will be monitored carefully while you are receiving this medicine. You will need important blood work done while you are taking this medicine. This medicine can cause serious allergic reactions. To reduce your risk you  will need to take  other medicine(s) before treatment with this medicine. If you experience allergic reactions like skin rash, itching or hives, swelling of the face, lips, or tongue, tell your doctor or health care professional right away. In some cases, you may be given additional medicines to help with side effects. Follow all directions for their use. This drug may make you feel generally unwell. This is not uncommon, as chemotherapy can affect healthy cells as well as cancer cells. Report any side effects. Continue your course of treatment even though you feel ill unless your doctor tells you to stop. Call your doctor or health care professional for advice if you get a fever, chills or sore throat, or other symptoms of a cold or flu. Do not treat yourself. This drug decreases your body's ability to fight infections. Try to avoid being around people who are sick. This medicine may increase your risk to bruise or bleed. Call your doctor or health care professional if you notice any unusual bleeding. Be careful brushing and flossing your teeth or using a toothpick because you may get an infection or bleed more easily. If you have any dental work done, tell your dentist you are receiving this medicine. Avoid taking products that contain aspirin, acetaminophen, ibuprofen, naproxen, or ketoprofen unless instructed by your doctor. These medicines may hide a fever. Do not become pregnant while taking this medicine. Women should inform their doctor if they wish to become pregnant or think they might be pregnant. There is a potential for serious side effects to an unborn child. Talk to your health care professional or pharmacist for more information. Do not breast-feed an infant while taking this medicine. Men are advised not to father a child while receiving this medicine. This product may contain alcohol. Ask your pharmacist or healthcare provider if this medicine contains alcohol. Be sure to tell all healthcare providers you  are taking this medicine. Certain medicines, like metronidazole and disulfiram, can cause an unpleasant reaction when taken with alcohol. The reaction includes flushing, headache, nausea, vomiting, sweating, and increased thirst. The reaction can last from 30 minutes to several hours. What side effects may I notice from receiving this medicine? Side effects that you should report to your doctor or health care professional as soon as possible: -allergic reactions like skin rash, itching or hives, swelling of the face, lips, or tongue -low blood counts - This drug may decrease the number of white blood cells, red blood cells and platelets. You may be at increased risk for infections and bleeding. -signs of infection - fever or chills, cough, sore throat, pain or difficulty passing urine -signs of decreased platelets or bleeding - bruising, pinpoint red spots on the skin, black, tarry stools, nosebleeds -signs of decreased red blood cells - unusually weak or tired, fainting spells, lightheadedness -breathing problems -chest pain -high or low blood pressure -mouth sores -nausea and vomiting -pain, swelling, redness or irritation at the injection site -pain, tingling, numbness in the hands or feet -slow or irregular heartbeat -swelling of the ankle, feet, hands Side effects that usually do not require medical attention (report to your doctor or health care professional if they continue or are bothersome): -bone pain -complete hair loss including hair on your head, underarms, pubic hair, eyebrows, and eyelashes -changes in the color of fingernails -diarrhea -loosening of the fingernails -loss of appetite -muscle or joint pain -red flush to skin -sweating This list may not describe all possible side effects. Call your doctor for  medical advice about side effects. You may report side effects to FDA at 1-800-FDA-1088. Where should I keep my medicine? This drug is given in a hospital or clinic and  will not be stored at home. NOTE: This sheet is a summary. It may not cover all possible information. If you have questions about this medicine, talk to your doctor, pharmacist, or health care provider.  2018 [employer] Standard (2014-12-31 19:58:00)  Carboplatin injection What is this medicine? CARBOPLATIN (KAR boe pla tin) is a chemotherapy drug. It targets fast dividing cells, like cancer cells, and causes these cells to die. This medicine is used to treat ovarian cancer and many other cancers. This medicine may be used for other purposes; ask your health care provider or pharmacist if you have questions. COMMON BRAND NAME(S): Paraplatin What should I tell my health care provider before I take this medicine? They need to know if you have any of these conditions: -blood disorders -hearing problems -kidney disease -recent or ongoing radiation therapy -an unusual or allergic reaction to carboplatin, cisplatin, other chemotherapy, other medicines, foods, dyes, or preservatives -pregnant or trying to get pregnant -breast-feeding How should I use this medicine? This drug is usually given as an infusion into a vein. It is administered in a hospital or clinic by a specially trained health care professional. Talk to your pediatrician regarding the use of this medicine in children. Special care may be needed. Overdosage: If you think you have taken too much of this medicine contact a poison control center or emergency room at once. NOTE: This medicine is only for you. Do not share this medicine with others. What if I miss a dose? It is important not to miss a dose. Call your doctor or health care professional if you are unable to keep an appointment. What may interact with this medicine? -medicines for seizures -medicines to increase blood counts like filgrastim, pegfilgrastim, sargramostim -some antibiotics like amikacin, gentamicin, neomycin, streptomycin, tobramycin -vaccines Talk to  your doctor or health care professional before taking any of these medicines: -acetaminophen -aspirin -ibuprofen -ketoprofen -naproxen This list may not describe all possible interactions. Give your health care provider a list of all the medicines, herbs, non-prescription drugs, or dietary supplements you use. Also tell them if you smoke, drink alcohol, or use illegal drugs. Some items may interact with your medicine. What should I watch for while using this medicine? Your condition will be monitored carefully while you are receiving this medicine. You will need important blood work done while you are taking this medicine. This drug may make you feel generally unwell. This is not uncommon, as chemotherapy can affect healthy cells as well as cancer cells. Report any side effects. Continue your course of treatment even though you feel ill unless your doctor tells you to stop. In some cases, you may be given additional medicines to help with side effects. Follow all directions for their use. Call your doctor or health care professional for advice if you get a fever, chills or sore throat, or other symptoms of a cold or flu. Do not treat yourself. This drug decreases your body's ability to fight infections. Try to avoid being around people who are sick. This medicine may increase your risk to bruise or bleed. Call your doctor or health care professional if you notice any unusual bleeding. Be careful brushing and flossing your teeth or using a toothpick because you may get an infection or bleed more easily. If you have any dental work done, tell  your dentist you are receiving this medicine. Avoid taking products that contain aspirin, acetaminophen, ibuprofen, naproxen, or ketoprofen unless instructed by your doctor. These medicines may hide a fever. Do not become pregnant while taking this medicine. Women should inform their doctor if they wish to become pregnant or think they might be pregnant. There is a  potential for serious side effects to an unborn child. Talk to your health care professional or pharmacist for more information. Do not breast-feed an infant while taking this medicine. What side effects may I notice from receiving this medicine? Side effects that you should report to your doctor or health care professional as soon as possible: -allergic reactions like skin rash, itching or hives, swelling of the face, lips, or tongue -signs of infection - fever or chills, cough, sore throat, pain or difficulty passing urine -signs of decreased platelets or bleeding - bruising, pinpoint red spots on the skin, black, tarry stools, nosebleeds -signs of decreased red blood cells - unusually weak or tired, fainting spells, lightheadedness -breathing problems -changes in hearing -changes in vision -chest pain -high blood pressure -low blood counts - This drug may decrease the number of white blood cells, red blood cells and platelets. You may be at increased risk for infections and bleeding. -nausea and vomiting -pain, swelling, redness or irritation at the injection site -pain, tingling, numbness in the hands or feet -problems with balance, talking, walking -trouble passing urine or change in the amount of urine Side effects that usually do not require medical attention (report to your doctor or health care professional if they continue or are bothersome): -hair loss -loss of appetite -metallic taste in the mouth or changes in taste This list may not describe all possible side effects. Call your doctor for medical advice about side effects. You may report side effects to FDA at 1-800-FDA-1088. Where should I keep my medicine? This drug is given in a hospital or clinic and will not be stored at home. NOTE: This sheet is a summary. It may not cover all possible information. If you have questions about this medicine, talk to your doctor, pharmacist, or health care provider.  2018 [employer]/Gold  Standard (2007-06-06 14:38:05)

## 2018-02-27 NOTE — Progress Notes (Signed)
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    01/30/2018 Initial Diagnosis    Metastasis to brain [facility])    01/31/2018 Imaging    MRI Brain W WO Contrast 01/31/18  IMPRESSION: 3 Tesla study does not disclose any additional lesions. Solitary metastasis in the right superior cerebellum measuring 2.9 x 2.6 x 3.0 cm with mild surrounding edema. 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    02/03/2018 - 02/08/2018 Radiation Therapy    SBRT with Dr. Isidore Williams on 02/03/18, 02/06/18, 02/08/18     Esophageal cancer, stage IV ([place])   01/08/2018 Imaging    CT AP W Constrast 01/08/18  IMPRESSION: 1. Nonobstructed, nondistended bowel. No acute inflammatory process. 2. Moderate-sized hiatal hernia noted. 3. Low-density 1.7 cm left adrenal nodule likely to represent a small adenoma. Lower pole left renal cyst measuring 1.8 cm. 4. Moderate aortic atherosclerosis. 5. Lumbar spondylosis and facet arthropathy.    01/27/2018 Imaging    CT Chest W Contrast 01/27/18  IMPRESSION: 1. Large mass involving the distal half of the esophagus spanning approximately 11.7 cm is identified compatible with primary esophageal neoplasm. 2. Small posterior mediastinal and high right paratracheal lymph nodes are noted which may represent foci of metastatic adenopathy. 3. The small pulmonary nodules in the right lung  are unchanged from 01/15/2016 and are favored to represent a benign process. 4. Aortic Atherosclerosis (ICD10-I70.0) and Emphysema (ICD10-J43.9). 5. Coronary artery atherosclerotic calcifications.    01/30/2018 Initial Diagnosis    Esophageal cancer, stage IV ([place])    02/03/2018 - 02/08/2018 Radiation Therapy    SBRT to oligo brain metastasis, in 3 fractions, under Dr. Isidore Williams     02/16/2018 PET scan    PET 02/16/18 IMPRESSION: 1. A 15.7 cm in length esophageal mass extends from the aortic arch level down to the distal esophagus, maximum SUV 22.4, compatible with malignancy. There is a small right lower neck level IV lymph node with lower than mediastinal blood pool activity, and a right upper paratracheal lymph node with just above mediastinal blood pool activity. 2. Faint speckled heterogeneity in the liver, for example in the lateral segment left hepatic lobe, is likely incidental/benign given the lack of correlate of CT finding. It might be prudent to obtain an MRI just to make sure that there is not a small early metastatic lesion in this vicinity. 3. Known right cerebellar vermian metastatic lesion, with expected hypo activity compared to the surrounding brain activity. 4. The right middle lobe pulmonary nodule measures 1.1 by 1.0 cm and is mildly hypermetabolic with maximum SUV 2.6. This lesion measured 6 by 5 mm back on 12/06/2003 and 1.2 by 1.1 cm on 10/15/2015. I am uncertain what to make of the very slow growth and mildly accentuated metabolic activity. This could be a chronic granulomatous process. Low-grade adenocarcinoma of the lung seems unlikely to be so indolent as to only mildly increase over a 14 year period, but  is presumably a differential diagnostic consideration. Surveillance is suggested. 5. Other imaging findings of potential clinical significance: Aortic Atherosclerosis (ICD10-I70.0). Descending and sigmoid colon diverticulosis. Gas in the urinary bladder,  most common cause would be recent catheterization. Minimal chronic left ethmoid sinusitis.     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 Ultrasound-guided biopsy is recommended.    01/10/2018 Initial Biopsy    Diagnsis from Breast Biopsy 01/10/18  Breast, right, needle core biopsy, 6 o'clock - INVASIVE DUCTAL CARCINOMA WITH CALCIFICATIONS, SEE COMMENT. - DUCTAL CARCINOMA IN SITU. 1 of 2 FINAL for Kristin Williams [phone]) Microscopic Comment The carcinoma appears grade 2. Prognostic markers will be ordered. Dr. Lyndon Williams has reviewed the case. The case was called to [facility] on 01/11/2018.    01/10/2018 Receptors her2    The tumor cells are NEGATIVE for Her2 (1+). Estrogen Receptor: 100%, POSITIVE, STRONG STAINING INTENSITY Progesterone Receptor: 100%, POSITIVE, STRONG STAINING INTENSITY Proliferation Marker Ki67: 15%    01/10/2018 Cancer Staging    Staging form: Breast, AJCC 8th Edition - Clinical stage from 01/10/2018: Stage IA (cT1b, cN0, cM0, G2, ER+, PR+, HER2-) - Signed by Kristin Merle, MD on 02/03/2018    01/30/2018 Initial Diagnosis    Breast cancer ([place])     CURRENT THERAPY: Concurrent chemoradiation with chemotherapy consisting of carboplatin for an AUC of 2 and paclitaxel 50 mg meter squared.  First dose on 02/27/2018.  INTERVAL HISTORY: Kristin Williams 72 y.o. female returns for routine follow-up visit accompanied by her family members.  Her last visit, the patient had a fall when she lost her footing and fell into a bookcase.  She was evaluated in the emergency room and a CT of the head  and face were performed did not show acute injury.  She continues to be bruise to her left face and left arm.  Denies fevers and chills.  Denies chest pain, shortness of breath, cough, hemoptysis.  Denies nausea, vomiting, constipation, diarrhea.  She reports a decreased appetite and she has lost weight since her last visit.  The patient is here for evaluation prior to cycle #1 of her chemotherapy.  MEDICAL HISTORY: Past Medical History:  Diagnosis Date  . Arthritis   . Cancer (HCC)    Cervical  . Cancer ([place])    Breast  . Cancer ([place])    Vaginal  . Complication of anesthesia   . COPD (chronic obstructive pulmonary disease) ([place])   . Headache(784.0)   . Hyperlipidemia   . Leg pain   . Peripheral vascular disease ([place])   . Personal history of chemotherapy   . Personal history of radiation therapy   . PONV (postoperative nausea and vomiting)     ALLERGIES:  is allergic to flonase [fluticasone propionate].  MEDICATIONS:  Current Outpatient Medications  Medication Sig Dispense Refill  . acetaminophen (TYLENOL) 160 MG/5ML liquid Take 325 mg by mouth every 4 (four) hours as needed for fever.    Marland Kitchen dexamethasone (DECADRON) 4 MG tablet Take 1 tablet (4 mg total) by mouth every 12 (twelve) hours. (Patient taking differently: Take 4 mg by mouth daily. )  60 tablet 2  . LORazepam (ATIVAN) 1 MG tablet Take 1 tablet (1 mg total) by mouth 3 (three) times daily as needed for anxiety. 15 tablet 0  . metoCLOPramide (REGLAN) 10 MG tablet Take 1 tablet (10 mg total) by mouth every 6 (six) hours as needed for nausea. 60 tablet 2  . omeprazole (PRILOSEC) 20 MG capsule Take 40 mg by mouth daily.     Marland Kitchen lidocaine-prilocaine (EMLA) cream Apply 1 application topically as needed. 30 g 1   No current facility-administered medications for this visit.     SURGICAL HISTORY:  Past Surgical History:  Procedure Laterality Date  . ABDOMINAL HYSTERECTOMY  1975  . BREAST LUMPECTOMY  2005  . GANGLION CYST  EXCISION  1977  . ILIAC ARTERY STENT  04/20/2004   CSD right iliac occlusive disease  . IR IMAGING GUIDED PORT INSERTION  02/22/2018    REVIEW OF SYSTEMS:   Review of Systems  Constitutional: Negative for chills, fatigue, fever.  Positive for decreased appetite and weight loss. HENT:   Negative for mouth sores, nosebleeds, sore throat and trouble swallowing.   Eyes: Negative for eye problems and icterus.  Respiratory: Negative for cough, hemoptysis, shortness of breath and wheezing.   Cardiovascular: Negative for chest pain.  The patient's daughter reports that her left leg is more swollen than the right.  She does notice this today. Gastrointestinal: Negative for abdominal pain, constipation, diarrhea, nausea and vomiting.  Genitourinary: Negative for bladder incontinence, difficulty urinating, dysuria, frequency and hematuria.   Musculoskeletal: Negative for back pain, neck pain and neck stiffness.  Skin: Negative for itching and rash.  Neurological: Positive for being off balance and a recent fall.  Uses a walker or cane. Hematological: Negative for adenopathy. Does not bruise/bleed easily.  Psychiatric/Behavioral: Negative for confusion, depression and sleep disturbance. The patient is not nervous/anxious.     PHYSICAL EXAMINATION:  Blood pressure (!) 124/58, pulse 77, temperature 97.7 F (36.5 C), temperature source Oral, resp. rate 17, height 5' (1.524 m), weight 207 lb 12.8 oz (94.3 kg), last menstrual period 02/17/2018, SpO2 97 %.  ECOG PERFORMANCE STATUS: 1 - Symptomatic but completely ambulatory  Physical Exam  Constitutional: Oriented to person, place, and time and well-developed, well-nourished, and in no distress. No distress.  HENT:  Head: Normocephalic and atraumatic.  Mouth/Throat: Oropharynx is clear and moist. No oropharyngeal exudate.  Eyes: Conjunctivae are normal. Right eye exhibits no discharge. Left eye exhibits no discharge. No scleral icterus.  Bruise noted  to the left eye. Neck: Normal range of motion. Neck supple.  Cardiovascular: Normal rate, regular rhythm, normal heart sounds and intact distal pulses.  Right lower extremity with no edema.  Left lower extremity with 1+ edema up to the knee. Pulmonary/Chest: Effort normal and breath sounds normal. No respiratory distress. No wheezes. No rales.  Abdominal: Soft. Bowel sounds are normal. Exhibits no distension and no mass. There is no tenderness.  Musculoskeletal: Normal range of motion. Exhibits no edema.  Lymphadenopathy:    No cervical adenopathy.  Neurological: Alert and oriented to person, place, and time. Exhibits normal muscle tone. Coordination normal.  Skin: Skin is warm and dry. No rash noted. Not diaphoretic. No erythema. No pallor.  Psychiatric: Mood, memory and judgment normal.  Vitals reviewed.  LABORATORY DATA: Lab Results  Component Value Date   WBC 7.8 02/27/2018   HGB 12.8 02/27/2018   HCT 38.6 02/27/2018   MCV 91.7 02/27/2018   PLT 102 (L) 02/27/2018  Chemistry      Component Value Date/Time   NA 135 02/27/2018 1230   K 4.6 02/27/2018 1230   CL 100 02/27/2018 1230   CO2 25 02/27/2018 1230   BUN 14 02/27/2018 1230   CREATININE 0.71 02/27/2018 1230      Component Value Date/Time   CALCIUM 8.6 (L) 02/27/2018 1230   ALKPHOS 87 02/27/2018 1230   AST 14 (L) 02/27/2018 1230   ALT 54 (H) 02/27/2018 1230   BILITOT 0.5 02/27/2018 1230       RADIOGRAPHIC STUDIES:  Ct Head Wo Contrast  Result Date: 02/17/2018 CLINICAL DATA:  Brain tumor being treated radiation. Imbalance. EXAM: CT HEAD WITHOUT CONTRAST CT MAXILLOFACIAL WITHOUT CONTRAST TECHNIQUE: Multidetector CT imaging of the head and maxillofacial structures were performed using the standard protocol without intravenous contrast. Multiplanar CT image reconstructions of the maxillofacial structures were also generated. COMPARISON:  MRI 01/31/2018 and CT 01/26/2018 FINDINGS: CT HEAD FINDINGS Brain:  Redemonstration of a calcified intra-axial mass involving the right cerebellar hemisphere with slight extrinsic mass effect on the adjacent fourth ventricle and vermis from the right. This currently measures 2.5 x 2.8 x 2.6 cm. No additional lesions are noted. Cerebral atrophy with minimal small vessel ischemic disease in the supratentorial compartment is noted. No hydrocephalus is seen. No extra-axial fluid. Vascular: No hyperdense vessel sign. Skull: Intact Other: None CT MAXILLOFACIAL FINDINGS Osseous: Intact maxillofacial bones. No aggressive osseous lesions. Orbits: Bilateral lens replacements. Symmetric appearance of the globes. No retrobulbar abnormality. Sinuses: Mild ethmoid sinus mucosal thickening. The remainder of the paranasal sinuses are clear. Soft tissues: Left periorbital soft tissue laceration with contusion/swelling. IMPRESSION: 1. 2.5 x 2.8 x 2.6 cm calcified intra-axial mass involving the right cerebellar hemisphere with slight extrinsic mass effect on the adjacent fourth ventricle and vermis from the right. This is stable in appearance consistent solitary metastasis. 2. Left periorbital soft tissue laceration with soft tissue swelling about without underlying fracture. 3. Cerebral atrophy with minimal small vessel ischemic disease. Electronically Signed   By: Ashley Royalty M.D.   On: 02/17/2018 23:37   Mr Jeri Cos [place] Contrast  Result Date: 01/31/2018 CLINICAL DATA:  Vertigo and vomiting over the last 6 weeks. Weight loss. Esophageal cancer. Cerebellar metastasis. EXAM: MRI HEAD WITHOUT AND WITH CONTRAST TECHNIQUE: Multiplanar, multiecho pulse sequences of the brain and surrounding structures were obtained without and with intravenous contrast. CONTRAST:  10 cc Gadavist COMPARISON:  01/27/2018 FINDINGS: Brain: No additional lesions are disclosed. There is a 2.9 x 2.6 x 3.0 cm metastasis in the right superior cerebellum with somewhat lobular contours and mild surrounding vasogenic edema. The  lesion is uniformly hyperdense at CT in shows fairly uniform low T2 signal on MRI, favoring psammomatous calcification rather than hemorrhage. There are probably some minor petechial hemorrhagic components. Elsewhere, there are mild to moderate chronic small-vessel ischemic changes of the cerebral hemispheric white matter. No hydrocephalus or extra-axial collection. Vascular: Major vessels at the base of the brain show flow. Skull and upper cervical spine: Negative Sinuses/Orbits: Clear/normal Other: None IMPRESSION: 3 Tesla study does not disclose any additional lesions. Solitary metastasis in the right superior cerebellum measuring 2.9 x 2.6 x 3.0 cm with mild surrounding edema. 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 Electronically Signed   By: Nelson Chimes M.D.   On: 01/31/2018 06:33   Nm Pet Image Initial (pi) Skull Base To Thigh  Result Date: 02/13/2018 CLINICAL DATA:  Initial treatment strategy for esophageal cancer. EXAM: NUCLEAR MEDICINE PET SKULL BASE TO THIGH TECHNIQUE: 11.1  mCi F-18 FDG was injected intravenously. Full-ring PET imaging was performed from the skull base to thigh after the radiotracer. CT data was obtained and used for attenuation correction and anatomic localization. Fasting blood glucose: 109 mg/dl COMPARISON:  CT scans of 01/26/2018 and 01/08/2018 FINDINGS: Mediastinal blood pool activity: SUV max 3.0 NECK: The known right cerebellar vermian metastatic lesion is hypoactive compared to the surrounding brain activity. A right level IV lymph node measures 0.6 cm in short axis on image 50/4 and has a maximum SUV of 2.8. Incidental CT findings: There is opacification of a single left ethmoid air cell. Atherosclerotic calcification of both common carotid arteries. CHEST: A long esophageal mass extends from the level of the aortic arch down to the distal esophagus and is diffusely hypermetabolic with maximum SUV 22.4 A right upper paratracheal node measuring 0.8 cm in short axis on image 57/4  has maximum SUV of 3.2. The right middle lobe pulmonary nodule measuring 1.1 by 1.0 cm on image 86/4 has a maximum SUV of 2.6. Incidental CT findings: Azygos fissure noted. Dense calcification medially in the left breast without hypermetabolic activity. ABDOMEN/PELVIS: Scattered activity in bowel is likely physiologic. There is some speckled heterogeneity in the liver, for example in the lateral segment left hepatic lobe where maximum SUV is up to 4.3, is post other portions of the liver where the maximum SUV is more in the 3.5 range. There is no supportive CT data of a liver lesion, and overall I suspect that the slight heterogeneity is incidental. Incidental CT findings: Aortoiliac atherosclerotic vascular disease. Mildly contracted gallbladder. Descending and sigmoid colon diverticulosis. Small amount of gas in the urinary bladder, possibly from recent catheterization, correlate with patient history. SKELETON: No significant abnormal hypermetabolic activity in this region. Incidental CT findings: Lower lumbar degenerative facet arthropathy. Sclerosis along the pubis. Old deformity along the left inferior pubic ramus likely from a prior fracture. IMPRESSION: 1. A 15.7 cm in length esophageal mass extends from the aortic arch level down to the distal esophagus, maximum SUV 22.4, compatible with malignancy. There is a small right lower neck level IV lymph node with lower than mediastinal blood pool activity, and a right upper paratracheal lymph node with just above mediastinal blood pool activity. 2. Faint speckled heterogeneity in the liver, for example in the lateral segment left hepatic lobe, is likely incidental/benign given the lack of correlate of CT finding. It might be prudent to obtain an MRI just to make sure that there is not a small early metastatic lesion in this vicinity. 3. Known right cerebellar vermian metastatic lesion, with expected hypo activity compared to the surrounding brain activity. 4. The  right middle lobe pulmonary nodule measures 1.1 by 1.0 cm and is mildly hypermetabolic with maximum SUV 2.6. This lesion measured 6 by 5 mm back on 12/06/2003 and 1.2 by 1.1 cm on 10/15/2015. I am uncertain what to make of the very slow growth and mildly accentuated metabolic activity. This could be a chronic granulomatous process. Low-grade adenocarcinoma of the lung seems unlikely to be so indolent as to only mildly increase over a 14 year period, but is presumably a differential diagnostic consideration. Surveillance is suggested. 5. Other imaging findings of potential clinical significance: Aortic Atherosclerosis (ICD10-I70.0). Descending and sigmoid colon diverticulosis. Gas in the urinary bladder, most common cause would be recent catheterization. Minimal chronic left ethmoid sinusitis. Electronically Signed   By: Van Clines M.D.   On: 02/13/2018 17:34   Ir Imaging Guided Port Insertion  Result Date: 02/22/2018 CLINICAL DATA:  Stage IV esophageal carcinoma and history of breast carcinoma. Need for porta cath to begin chemotherapy. EXAM: IMPLANTED PORT A CATH PLACEMENT WITH ULTRASOUND AND FLUOROSCOPIC GUIDANCE ANESTHESIA/SEDATION: 2.0 mg IV Versed; 100 mcg IV Fentanyl Total Moderate Sedation Time:  32 minutes The patient's level of consciousness and physiologic status were continuously monitored during the procedure by Radiology nursing. Additional Medications: 2 g IV Ancef. FLUOROSCOPY TIME:  48 seconds.  16.2 mGy. PROCEDURE: The procedure, risks, benefits, and alternatives were explained to the patient. Questions regarding the procedure were encouraged and answered. The patient understands and consents to the procedure. A time-out was performed prior to initiating the procedure. Ultrasound was utilized to confirm patency of the right internal jugular vein. The right neck and chest were prepped with chlorhexidine in a sterile fashion, and a sterile drape was applied covering the operative field.  Maximum barrier sterile technique with sterile gowns and gloves were used for the procedure. Local anesthesia was provided with 1% lidocaine. After creating a small venotomy incision, a 21 gauge needle was advanced into the right internal jugular vein under direct, real-time ultrasound guidance. Ultrasound image documentation was performed. After securing guidewire access, an 8 Fr dilator was placed. A J-wire was kinked to measure appropriate catheter length. A subcutaneous port pocket was then created along the upper chest wall utilizing sharp and blunt dissection. Portable cautery was utilized. The pocket was irrigated with sterile saline. A single lumen power injectable port was chosen for placement. The 8 Fr catheter was tunneled from the port pocket site to the venotomy incision. The port was placed in the pocket. External catheter was trimmed to appropriate length based on guidewire measurement. At the venotomy, an 8 Fr peel-away sheath was placed over a guidewire. The catheter was then placed through the sheath and the sheath removed. Final catheter positioning was confirmed and documented with a fluoroscopic spot image. The port was accessed with a needle and aspirated and flushed with heparinized saline. The access needle was removed. The venotomy and port pocket incisions were closed with subcutaneous 3-0 Monocryl and subcuticular 4-0 Vicryl. Dermabond was applied to both incisions. COMPLICATIONS: COMPLICATIONS None FINDINGS: After catheter placement, the tip lies at the cavo-atrial junction. The catheter aspirates normally and is ready for immediate use. IMPRESSION: Placement of single lumen port a cath via right internal jugular vein. The catheter tip lies at the cavo-atrial junction. A power injectable port a cath was placed and is ready for immediate use. Electronically Signed   By: Aletta Edouard M.D.   On: 02/22/2018 16:57   Ct Maxillofacial Wo Cm  Result Date: 02/17/2018 CLINICAL DATA:  Brain  tumor being treated radiation. Imbalance. EXAM: CT HEAD WITHOUT CONTRAST CT MAXILLOFACIAL WITHOUT CONTRAST TECHNIQUE: Multidetector CT imaging of the head and maxillofacial structures were performed using the standard protocol without intravenous contrast. Multiplanar CT image reconstructions of the maxillofacial structures were also generated. COMPARISON:  MRI 01/31/2018 and CT 01/26/2018 FINDINGS: CT HEAD FINDINGS Brain: Redemonstration of a calcified intra-axial mass involving the right cerebellar hemisphere with slight extrinsic mass effect on the adjacent fourth ventricle and vermis from the right. This currently measures 2.5 x 2.8 x 2.6 cm. No additional lesions are noted. Cerebral atrophy with minimal small vessel ischemic disease in the supratentorial compartment is noted. No hydrocephalus is seen. No extra-axial fluid. Vascular: No hyperdense vessel sign. Skull: Intact Other: None CT MAXILLOFACIAL FINDINGS Osseous: Intact maxillofacial bones. No aggressive osseous lesions. Orbits: Bilateral lens replacements. Symmetric  appearance of the globes. No retrobulbar abnormality. Sinuses: Mild ethmoid sinus mucosal thickening. The remainder of the paranasal sinuses are clear. Soft tissues: Left periorbital soft tissue laceration with contusion/swelling. IMPRESSION: 1. 2.5 x 2.8 x 2.6 cm calcified intra-axial mass involving the right cerebellar hemisphere with slight extrinsic mass effect on the adjacent fourth ventricle and vermis from the right. This is stable in appearance consistent solitary metastasis. 2. Left periorbital soft tissue laceration with soft tissue swelling about without underlying fracture. 3. Cerebral atrophy with minimal small vessel ischemic disease. Electronically Signed   By: Ashley Royalty M.D.   On: 02/17/2018 23:37     ASSESSMENT/PLAN:  Esophageal cancer, stage IV [facility]) This is a pleasant 72 year old female with stage IV esophageal cancer.  She is here to begin concurrent chemoradiation  with chemotherapy consisting of carboplatin for an AUC of 2 and paclitaxel 50 mg meter squared.  Side effects have been reviewed and the patient states understanding of this.  She is agreeable to proceed.  Labs from today been reviewed and are adequate for treatment.  She will proceed with cycle #1 of her treatment today as scheduled.  She will follow-up weekly for labs, return visit, and chemotherapy.  She will continue radiation under the care of Dr. Isidore Williams.  EMLA cream has been sent to her pharmacy for her Port-A-Cath.  She has antiemetics at home for use.  Thrombocytopenia ([place]) The patient has thrombocytopenia of unclear etiology.  Platelets are adequate to proceed with treatment.  Her platelet count has been slowly declining over the past few weeks.  Labs reviewed with Dr. Burr Medico and will add on a DIC panel today.  Left leg swelling The patient has left lower extremity edema.  We will obtain a Doppler ultrasound within the next 24 to 48 hours to evaluate for DVT.  Will consider anticoagulation if DVT is present.  Metastasis to brain [facility]) The patient is status post stereotactic radiation to the brain.  Her slurred speech and balance stability have continued to improve.  She is slowly tapering off her dexamethasone.  She is currently on dexamethasone 4 mg daily and will decrease to 2 mg daily later this week and then discontinue on 03/08/2018.  Protein calorie malnutrition ([place]) The patient has a decreased appetite and continues to lose weight.  She is due to begin concurrent chemoradiation today.  I have placed a referral to the dietitian.   Orders Placed This Encounter  Procedures  . DIC (disseminated intravasc coag) panel    Standing Status:   Future    Number of Occurrences:   1    Standing Expiration Date:   02/28/2019  . Ambulatory referral to Nutrition and Diabetic [facility]    Referral Priority:   Routine    Referral Type:   Consultation    Referral Reason:   Specialty Services Required     Number of Visits Requested:   Evan, DNP, AGPCNP-BC, AOCNP 02/27/18

## 2018-02-27 NOTE — Assessment & Plan Note (Signed)
This is a pleasant 72 year old female with stage IV esophageal cancer.  She is here to begin concurrent chemoradiation with chemotherapy consisting of carboplatin for an AUC of 2 and paclitaxel 50 mg meter squared.  Side effects have been reviewed and the patient states understanding of this.  She is agreeable to proceed.  Labs from today been reviewed and are adequate for treatment.  She will proceed with cycle #1 of her treatment today as scheduled.  She will follow-up weekly for labs, return visit, and chemotherapy.  She will continue radiation under the care of Dr. Isidore Moos.  EMLA cream has been sent to her pharmacy for her Port-A-Cath.  She has antiemetics at home for use.

## 2018-02-27 NOTE — Assessment & Plan Note (Signed)
The patient has thrombocytopenia of unclear etiology.  Platelets are adequate to proceed with treatment.  Her platelet count has been slowly declining over the past few weeks.  Labs reviewed with Dr. Burr Medico and will add on a DIC panel today.

## 2018-02-27 NOTE — Telephone Encounter (Signed)
CALLED PATIENT TO ASK PT. ABOUT SEEING DR. Isidore Moos PRIOR TO [place], LVM FOR A RETURN CALL

## 2018-02-27 NOTE — Assessment & Plan Note (Signed)
The patient has a decreased appetite and continues to lose weight.  She is due to begin concurrent chemoradiation today.  I have placed a referral to the dietitian.

## 2018-02-28 ENCOUNTER — Inpatient Hospital Stay (HOSPITAL_BASED_OUTPATIENT_CLINIC_OR_DEPARTMENT_OTHER): Payer: Medicare Other | Admitting: Medical

## 2018-02-28 ENCOUNTER — Ambulatory Visit
Admission: RE | Admit: 2018-02-28 | Discharge: 2018-02-28 | Disposition: A | Discharge status: Home / Self Care | Payer: Medicare Other | Source: Ambulatory Visit | Attending: Radiation Oncology | Admitting: Radiation Oncology

## 2018-02-28 ENCOUNTER — Ambulatory Visit (HOSPITAL_COMMUNITY)
Admission: RE | Admit: 2018-02-28 | Discharge: 2018-02-28 | Disposition: A | Discharge status: Home / Self Care | Payer: Medicare Other | Source: Ambulatory Visit | Attending: Oncology | Admitting: Oncology

## 2018-02-28 VITALS — BP 154/76 | HR 98 | Temp 97.9°F | Resp 18 | Ht 60.0 in | Wt 209.8 lb

## 2018-02-28 DIAGNOSIS — Z853 Personal history of malignant neoplasm of breast: Secondary | ICD-10-CM | POA: Diagnosis not present

## 2018-02-28 DIAGNOSIS — I82442 Acute embolism and thrombosis of left tibial vein: Secondary | ICD-10-CM

## 2018-02-28 DIAGNOSIS — C154 Malignant neoplasm of middle third of esophagus: Secondary | ICD-10-CM | POA: Diagnosis not present

## 2018-02-28 DIAGNOSIS — C159 Malignant neoplasm of esophagus, unspecified: Secondary | ICD-10-CM | POA: Diagnosis not present

## 2018-02-28 DIAGNOSIS — I82492 Acute embolism and thrombosis of other specified deep vein of left lower extremity: Secondary | ICD-10-CM

## 2018-02-28 DIAGNOSIS — C7931 Secondary malignant neoplasm of brain: Secondary | ICD-10-CM

## 2018-02-28 DIAGNOSIS — C50911 Malignant neoplasm of unspecified site of right female breast: Secondary | ICD-10-CM

## 2018-02-28 DIAGNOSIS — I82462 Acute embolism and thrombosis of left calf muscular vein: Secondary | ICD-10-CM | POA: Diagnosis not present

## 2018-02-28 DIAGNOSIS — I82432 Acute embolism and thrombosis of left popliteal vein: Secondary | ICD-10-CM | POA: Diagnosis not present

## 2018-02-28 DIAGNOSIS — M7989 Other specified soft tissue disorders: Secondary | ICD-10-CM | POA: Diagnosis not present

## 2018-02-28 DIAGNOSIS — Z170 Estrogen receptor positive status [ER+]: Secondary | ICD-10-CM | POA: Diagnosis not present

## 2018-02-28 DIAGNOSIS — Z5111 Encounter for antineoplastic chemotherapy: Secondary | ICD-10-CM | POA: Diagnosis not present

## 2018-02-28 DIAGNOSIS — Z510 Encounter for antineoplastic radiation therapy: Secondary | ICD-10-CM | POA: Diagnosis not present

## 2018-02-28 DIAGNOSIS — G629 Polyneuropathy, unspecified: Secondary | ICD-10-CM | POA: Diagnosis not present

## 2018-02-28 LAB — DIC (DISSEMINATED INTRAVASCULAR COAGULATION)PANEL
D-Dimer, Quant: 5.14 ug/mL-FEU — ABNORMAL HIGH (ref 0.00–0.50)
INR: 0.95
Platelets: 127 10*3/uL — ABNORMAL LOW (ref 150–400)
Prothrombin Time: 12.6 seconds (ref 11.4–15.2)
Smear Review: NONE SEEN
aPTT: 22 seconds — ABNORMAL LOW (ref 24–36)

## 2018-02-28 LAB — DIC (DISSEMINATED INTRAVASCULAR COAGULATION) PANEL: FIBRINOGEN: 424 mg/dL (ref 210–475)

## 2018-02-28 MED ORDER — WARFARIN SODIUM 2.5 MG PO TABS: ORAL_TABLET | ORAL | 2 refills | Status: DC

## 2018-02-28 MED ORDER — ENOXAPARIN SODIUM 150 MG/ML ~~LOC~~ SOLN
140.0000 mg | Freq: Every day | SUBCUTANEOUS | Status: DC
  Administered 2018-02-28: 140 mg via SUBCUTANEOUS
  Filled 2018-02-28: qty 0.93

## 2018-02-28 NOTE — Progress Notes (Signed)
*  Preliminary Results* Left lower extremity venous duplex completed. Left lower extremity is positive for deep vein thrombosis in the pop v, ssv, gastrocs and ptv. There is no evidence of left Baker's cyst.    02/28/2018 2:18 PM  Kristin Williams Dawna Part

## 2018-03-01 ENCOUNTER — Ambulatory Visit
Admission: RE | Admit: 2018-03-01 | Discharge: 2018-03-01 | Disposition: A | Discharge status: Home / Self Care | Payer: Medicare Other | Source: Ambulatory Visit | Attending: Radiation Oncology | Admitting: Radiation Oncology

## 2018-03-01 ENCOUNTER — Other Ambulatory Visit: Payer: Self-pay | Admitting: Medical

## 2018-03-01 ENCOUNTER — Inpatient Hospital Stay: Payer: Medicare Other

## 2018-03-01 ENCOUNTER — Other Ambulatory Visit: Payer: Self-pay

## 2018-03-01 ENCOUNTER — Telehealth: Payer: Self-pay | Admitting: Medical

## 2018-03-01 DIAGNOSIS — Z170 Estrogen receptor positive status [ER+]: Secondary | ICD-10-CM | POA: Diagnosis not present

## 2018-03-01 DIAGNOSIS — C159 Malignant neoplasm of esophagus, unspecified: Secondary | ICD-10-CM | POA: Diagnosis not present

## 2018-03-01 DIAGNOSIS — G629 Polyneuropathy, unspecified: Secondary | ICD-10-CM | POA: Diagnosis not present

## 2018-03-01 DIAGNOSIS — I82432 Acute embolism and thrombosis of left popliteal vein: Secondary | ICD-10-CM

## 2018-03-01 DIAGNOSIS — C7931 Secondary malignant neoplasm of brain: Secondary | ICD-10-CM | POA: Diagnosis not present

## 2018-03-01 DIAGNOSIS — C154 Malignant neoplasm of middle third of esophagus: Secondary | ICD-10-CM | POA: Diagnosis not present

## 2018-03-01 DIAGNOSIS — C50911 Malignant neoplasm of unspecified site of right female breast: Secondary | ICD-10-CM | POA: Diagnosis not present

## 2018-03-01 DIAGNOSIS — Z510 Encounter for antineoplastic radiation therapy: Secondary | ICD-10-CM | POA: Diagnosis not present

## 2018-03-01 DIAGNOSIS — Z5111 Encounter for antineoplastic chemotherapy: Secondary | ICD-10-CM | POA: Diagnosis not present

## 2018-03-01 DIAGNOSIS — Z853 Personal history of malignant neoplasm of breast: Secondary | ICD-10-CM | POA: Diagnosis not present

## 2018-03-01 MED ORDER — ENOXAPARIN SODIUM 150 MG/ML ~~LOC~~ SOLN
140.0000 mg | Freq: Once | SUBCUTANEOUS | Status: AC
  Administered 2018-03-01: 140 mg via SUBCUTANEOUS
  Filled 2018-03-01: qty 0.93

## 2018-03-01 MED ORDER — DEXAMETHASONE 4 MG PO TABS: 4.0000 mg | ORAL_TABLET | Freq: Every day | ORAL | 1 refills | Status: DC

## 2018-03-01 NOTE — Patient Instructions (Signed)
Enoxaparin injection What is this medicine? ENOXAPARIN (ee nox a PA rin) is used after knee, hip, or abdominal surgeries to prevent blood clotting. It is also used to treat existing blood clots in the lungs or in the veins. This medicine may be used for other purposes; ask your health care provider or pharmacist if you have questions. COMMON BRAND NAME(S): Lovenox What should I tell my health care provider before I take this medicine? They need to know if you have any of these conditions: -bleeding disorders, hemorrhage, or hemophilia -infection of the heart or heart valves -kidney or liver disease -previous stroke -prosthetic heart valve -recent surgery or delivery of a baby -ulcer in the stomach or intestine, diverticulitis, or other bowel disease -an unusual or allergic reaction to enoxaparin, heparin, pork or pork products, other medicines, foods, dyes, or preservatives -pregnant or trying to get pregnant -breast-feeding How should I use this medicine? This medicine is for injection under the skin. It is usually given by a health-care professional. You or a family member may be trained on how to give the injections. If you are to give yourself injections, make sure you understand how to use the syringe, measure the dose if necessary, and give the injection. To avoid bruising, do not rub the site where this medicine has been injected. Do not take your medicine more often than directed. Do not stop taking except on the advice of your doctor or health care professional. Make sure you receive a puncture-resistant container to dispose of the needles and syringes once you have finished with them. Do not reuse these items. Return the container to your doctor or health care professional for proper disposal. Talk to your pediatrician regarding the use of this medicine in children. Special care may be needed. Overdosage: If you think you have taken too much of this medicine contact a poison control  center or emergency room at once. NOTE: This medicine is only for you. Do not share this medicine with others. What if I miss a dose? If you miss a dose, take it as soon as you can. If it is almost time for your next dose, take only that dose. Do not take double or extra doses. What may interact with this medicine? -aspirin and aspirin-like medicines -certain medicines that treat or prevent blood clots -dipyridamole -NSAIDs, medicines for pain and inflammation, like ibuprofen or naproxen This list may not describe all possible interactions. Give your health care provider a list of all the medicines, herbs, non-prescription drugs, or dietary supplements you use. Also tell them if you smoke, drink alcohol, or use illegal drugs. Some items may interact with your medicine. What should I watch for while using this medicine? Visit your healthcare professional for regular checks on your progress. You may need blood work done while you are taking this medicine. Your condition will be monitored carefully while you are receiving this medicine. It is important not to miss any appointments. If you are going to need surgery or other procedure, tell your healthcare professional that you are using this medicine. Using this medicine for a long time may weaken your bones and increase the risk of bone fractures. Avoid sports and activities that might cause injury while you are using this medicine. Severe falls or injuries can cause unseen bleeding. Be careful when using sharp tools or knives. Consider using an electric razor. Take special care brushing or flossing your teeth. Report any injuries, bruising, or red spots on the skin to your healthcare   professional. Wear a medical ID bracelet or chain. Carry a card that describes your disease and details of your medicine and dosage times. What side effects may I notice from receiving this medicine? Side effects that you should report to your doctor or health care  professional as soon as possible: -allergic reactions like skin rash, itching or hives, swelling of the face, lips, or tongue -bone pain -signs and symptoms of bleeding such as bloody or black, tarry stools; red or dark-brown urine; spitting up blood or brown material that looks like coffee grounds; red spots on the skin; unusual bruising or bleeding from the eye, gums, or nose -signs and symptoms of a blood clot such as chest pain; shortness of breath; pain, swelling, or warmth in the leg -signs and symptoms of a stroke such as changes in vision; confusion; trouble speaking or understanding; severe headaches; sudden numbness or weakness of the face, arm or leg; trouble walking; dizziness; loss of coordination Side effects that usually do not require medical attention (report to your doctor or health care professional if they continue or are bothersome): -hair loss -pain, redness, or irritation at site where injected This list may not describe all possible side effects. Call your doctor for medical advice about side effects. You may report side effects to FDA at 1-800-FDA-1088. Where should I keep my medicine? Keep out of the reach of children. Store at room temperature between 15 and 30 degrees C (59 and 86 degrees F). Do not freeze. If your injections have been specially prepared, you may need to store them in the refrigerator. Ask your pharmacist. Throw away any unused medicine after the expiration date. NOTE: This sheet is a summary. It may not cover all possible information. If you have questions about this medicine, talk to your doctor, pharmacist, or health care provider.  2019 [employer] (2017-02-24 11:25:34)  

## 2018-03-01 NOTE — Telephone Encounter (Signed)
Patient came to scheduling for injection appointment. Per patient seen by VT yesterday and told to return for injection today, Thursday and Friday with lab on Friday. Appointments scheduled per 12/17 los - date for 12/18, 12/19 and 12/20 with lab confirmed with VT. Patient given schedule for December and January.

## 2018-03-02 ENCOUNTER — Telehealth: Payer: Self-pay

## 2018-03-02 ENCOUNTER — Inpatient Hospital Stay: Payer: Medicare Other

## 2018-03-02 ENCOUNTER — Ambulatory Visit
Admission: RE | Admit: 2018-03-02 | Discharge: 2018-03-02 | Disposition: A | Discharge status: Home / Self Care | Payer: Medicare Other | Source: Ambulatory Visit | Attending: Radiation Oncology | Admitting: Radiation Oncology

## 2018-03-02 ENCOUNTER — Telehealth: Payer: Self-pay | Admitting: Hematology

## 2018-03-02 DIAGNOSIS — I82432 Acute embolism and thrombosis of left popliteal vein: Secondary | ICD-10-CM

## 2018-03-02 DIAGNOSIS — Z853 Personal history of malignant neoplasm of breast: Secondary | ICD-10-CM | POA: Diagnosis not present

## 2018-03-02 DIAGNOSIS — Z170 Estrogen receptor positive status [ER+]: Secondary | ICD-10-CM | POA: Diagnosis not present

## 2018-03-02 DIAGNOSIS — C154 Malignant neoplasm of middle third of esophagus: Secondary | ICD-10-CM | POA: Diagnosis not present

## 2018-03-02 DIAGNOSIS — Z510 Encounter for antineoplastic radiation therapy: Secondary | ICD-10-CM | POA: Diagnosis not present

## 2018-03-02 DIAGNOSIS — G629 Polyneuropathy, unspecified: Secondary | ICD-10-CM | POA: Diagnosis not present

## 2018-03-02 DIAGNOSIS — C159 Malignant neoplasm of esophagus, unspecified: Secondary | ICD-10-CM | POA: Diagnosis not present

## 2018-03-02 DIAGNOSIS — C50911 Malignant neoplasm of unspecified site of right female breast: Secondary | ICD-10-CM | POA: Diagnosis not present

## 2018-03-02 DIAGNOSIS — C7931 Secondary malignant neoplasm of brain: Secondary | ICD-10-CM | POA: Diagnosis not present

## 2018-03-02 DIAGNOSIS — Z5111 Encounter for antineoplastic chemotherapy: Secondary | ICD-10-CM | POA: Diagnosis not present

## 2018-03-02 MED ORDER — ENOXAPARIN SODIUM 150 MG/ML ~~LOC~~ SOLN
140.0000 mg | Freq: Once | SUBCUTANEOUS | Status: AC
  Administered 2018-03-02: 140 mg via SUBCUTANEOUS
  Filled 2018-03-02: qty 0.93

## 2018-03-02 NOTE — Telephone Encounter (Signed)
Added appt in for 12/09 sch message - pt is aware of appts added for 12/30

## 2018-03-02 NOTE — Telephone Encounter (Signed)
Spoke with patient for follow up regarding her DVT, patient states leg is still swollen but not painful.  She had questions regarding Dexamethasone, Dr. Lanell Persons is weaning her off, consulted with Dr. Burr Medico, informed patient the Dexamethasone we sent in should be used after chemotherapy at the 4 mg dose.  She can continue to wean off per Dr. Lanell Persons. Patient verbalized an understanding.

## 2018-03-02 NOTE — Progress Notes (Signed)
[facility] Progress Note   Kristin Williams [id number] [birth year] 72 y.o.  Kristin Williams is managed by Dr. [name]  Actively treated with chemotherapy/immunotherapy/hormonal therapy: Yes  Current Therapy: Carboplatin & Paclitaxel with concurrent radiation treatment  Last Treated: 02/17/18 (cycle 1 day 1)  Assessment: Plan:    Acute deep vein thrombosis (DVT) of popliteal vein of left lower extremity ([place]) - Plan: enoxaparin (LOVENOX) injection 140 mg, warfarin (COUMADIN) 2.5 MG tablet, Protime-INR, CBC with Differential ([place] Only), DISCONTINUED: enoxaparin (LOVENOX) injection 140 mg   1) DVT of the left popliteal vein, left posterior tibial vein, and left gastrocnemius vein.  The patient was dosed with lovenox 140 mg [place] today.  This will be repeated daily in clinic through Friday.  She additionally will begin coumadin 5 mg tonight.  She will have an INR collected on 12/20.  Education was undertaken regarding coumadin interaction with NSAIDs with foods rich in vitamin K.  2) Stage IV esophageal cancer: The patient is managed by Dr. Burr Medico and is s/p cycle 1 day 1 of carboplatin and paclitaxel which was dosed on 02/27/18.  She is receiving concurrent radiation therapy.  Please see After Visit Summary for patient specific instructions.  Future Appointments  Date Time Provider [place]  03/02/2018  3:30 PM [facility] [place] FLUSH [facility] None  03/02/2018  5:20 PM [facility] LINAC 3 [facility] None  03/03/2018  1:45 PM [facility] LAB 1 [facility] None  03/03/2018  2:15 PM [facility] [place] FLUSH [facility] None  03/03/2018  5:20 PM [facility] LINAC 3 [facility] None  03/06/2018  8:00 AM [facility] LAB 1 [facility] None  03/06/2018  8:00 AM [facility] [place] FLUSH [facility] None  03/06/2018  8:30 AM [facility] INFUSION [facility] None  03/06/2018  9:00 AM [name], MD [facility] None  03/06/2018  4:30 PM [facility] LINAC 3 [facility] None    03/07/2018  3:45 PM [facility] LINAC 3 [facility] None  03/09/2018  4:30 PM [facility] LINAC 3 [facility] None  03/10/2018 11:40 AM Eppie Gibson, MD [facility] None  03/10/2018 12:25 PM [facility] LINAC 3 [facility] None  03/13/2018  8:15 AM [facility] LAB 4 [facility] None  03/13/2018  8:30 AM [facility] INFUSION [facility] None  03/13/2018  9:30 AM [name], MD [facility] None  03/13/2018 12:35 PM [facility] LINAC 3 [facility] None  03/14/2018 12:15 PM [facility] LINAC 3 [facility] None  03/16/2018  9:30 AM [facility] LINAC 3 [facility] None  03/17/2018 12:00 PM [facility] LINAC 3 [facility] None  03/20/2018  8:45 AM [facility] LAB 2 [facility] None  03/20/2018  9:00 AM [facility] [place] FLUSH [facility] None  03/20/2018  9:30 AM [facility] LINAC 3 [facility] None  03/20/2018  9:45 AM [name], MD [facility] None  03/20/2018 10:30 AM [facility] INFUSION [facility] None  03/20/2018 11:15 AM Cordella Register, Barbara L, RD [facility] None  03/21/2018  9:30 AM [facility] LINAC 3 [facility] None  03/22/2018  9:30 AM [facility] LINAC 3 [facility] None  03/23/2018  9:30 AM [facility] LINAC 3 [facility] None  03/24/2018  9:30 AM [facility] LINAC 3 [facility] None  03/27/2018  9:30 AM [facility] LINAC 3 [facility] None  03/27/2018 10:00 AM [facility] LAB 2 [facility] None  03/27/2018 10:15 AM [facility] [place] FLUSH [facility] None  03/27/2018 11:00 AM [name], MD [facility] None  03/27/2018 11:15 AM [facility] INFUSION [facility] None  03/28/2018  9:30 AM [facility] LINAC 3 [facility] None  03/29/2018  9:30 AM [facility] LINAC 3 [facility] None  03/30/2018  9:30 AM [facility] LINAC 3 [facility] None  03/31/2018  9:30 AM [facility] LINAC 3 [facility] None  04/03/2018  8:15 AM [facility] LAB 3 [facility] None  04/03/2018  8:30 AM [facility] [place] FLUSH [facility] None  04/03/2018  8:45 AM [name], MD [facility] None  04/03/2018  9:30 AM [facility] LINAC 3 [facility] None  04/03/2018  10:15 AM [facility] INFUSION [facility] None  04/04/2018  9:30 AM [facility] LINAC 3 [facility] None  04/05/2018  9:30 AM [facility] LINAC 3 [facility] None  04/06/2018  9:30 AM [facility] LINAC 3 [facility] None  04/07/2018  9:30 AM [facility] LINAC 3 [facility] None    Orders Placed This Encounter  Procedures  . Protime-INR  . CBC with Differential (Cancer Center Only)       Subjective:   Patient ID:  Kristin Williams is a 72 y.o. (DOB 1945-11-29) female.  Chief Complaint: No chief complaint on file.   HPI Kristin Williams is a 72 y.o. female with an ER positive breast cancer and metastatic esophageal cancer managed by Dr. Burr Medico and is s/p cycle 1 day 1 of carboplatin and paclitaxel which was dosed on 02/27/18/.  She is currently receiving SRS therapy for a metastatic brain lesion.  She was recently seen and had left unilateral lower extremity edema.  A d-dimer returned elevated.  She is s/p a doppler US today which returned positive for an acute thrombosis of the left popliteal vein, left posterior tibial vein, and left gastrocnemius vein.  Also noted was an acute superficial vein thrombosis of hte left small saphenous vein.  Medications: I have reviewed the patient's current medications.  Allergies:  Allergies  Allergen Reactions  . Flonase [Fluticasone Propionate] Other (See Comments)    Nose bleeds    Past Medical History:  Diagnosis Date  . Arthritis   . Cancer (HCC)    Cervical  . Cancer ([place])    Breast  . Cancer ([place])    Vaginal  . Complication of anesthesia   . COPD (chronic obstructive pulmonary disease) ([place])   . Headache(784.0)   . Hyperlipidemia   . Leg pain   . Peripheral vascular disease ([place])   . Personal history of chemotherapy   . Personal history of radiation therapy   . PONV (postoperative nausea and vomiting)     Past Surgical History:  Procedure Laterality Date  . ABDOMINAL HYSTERECTOMY  1975  . BREAST LUMPECTOMY  2005  . GANGLION CYST  EXCISION  1977  . ILIAC ARTERY STENT  04/20/2004   CSD right iliac occlusive disease  . IR IMAGING GUIDED PORT INSERTION  02/22/2018    Family History  Problem Relation Age of Onset  . Cancer Mother 34       Non-Hodgkins Disease T-Cell  . Heart failure Father   . COPD Father   . Heart disease Brother 47       MI and Heart Disease before age 52  . Heart attack Brother   . Lung cancer Maternal Uncle   . Cancer Cousin   . Prostate cancer Maternal Uncle   . Prostate cancer Brother        half brother  . Breast cancer Neg Hx     Social History   Socioeconomic History  . Marital status: Married    Spouse name: Not on file  . Number of children: 2  . Years of education: Not on file  . Highest education level: Not on file  Occupational History  . Not on file  Social Needs  . Financial resource strain: Not on file  .  Food insecurity:    Worry: Not on file    Inability: Not on file  . Transportation needs:    Medical: Not on file    Non-medical: Not on file  Tobacco Use  . Smoking status: Former Smoker    Packs/day: 0.50    Years: 40.00    Pack years: 20.00    Types: Cigarettes    Last attempt to quit: 01/27/2018    Years since quitting: 0.0  . Smokeless tobacco: Never Used  Substance and Sexual Activity  . Alcohol use: No  . Drug use: No  . Sexual activity: Not Currently  Lifestyle  . Physical activity:    Days per week: Not on file    Minutes per session: Not on file  . Stress: Not on file  Relationships  . Social connections:    Talks on phone: Not on file    Gets together: Not on file    Attends religious service: Not on file    Active member of club or organization: Not on file    Attends meetings of clubs or organizations: Not on file    Relationship status: Not on file  . Intimate partner violence:    Fear of current or ex partner: Not on file    Emotionally abused: Not on file    Physically abused: Not on file    Forced sexual activity: Not on file   Other Topics Concern  . Not on file  Social History Narrative   Resides in [place]. Resides with her husband.     Past Medical History, Surgical history, Social history, and Family history were reviewed and updated as appropriate.   Please see review of systems for further details on the patient's review from today.   Review of Systems:  Review of Systems  Respiratory: Negative for shortness of breath.   Cardiovascular: Positive for leg swelling (left leg). Negative for chest pain and palpitations.    Objective:   Physical Exam:  BP (!) 154/76 (BP Location: Right Arm, Patient Position: Sitting) Comment: nurse aware of bp  Pulse 98   Temp 97.9 F (36.6 C) (Oral)   Resp 18   Ht 5' (1.524 m)   Wt 209 lb 12.8 oz (95.2 kg)   LMP 02/17/2018   SpO2 97%   BMI 40.97 kg/m  ECOG: 0  Physical Exam Constitutional:      General: She is not in acute distress.    Appearance: Normal appearance. She is not ill-appearing.  HENT:     Head: Normocephalic and atraumatic.  Skin:    General: Skin is warm and dry.     Comments: Left unilateral lower extremity edema.  Neurological:     General: No focal deficit present.     Mental Status: She is alert.     Coordination: Coordination normal.     Lab Review:     Component Value Date/Time   NA 135 02/27/2018 1230   K 4.6 02/27/2018 1230   CL 100 02/27/2018 1230   CO2 25 02/27/2018 1230   GLUCOSE 131 (H) 02/27/2018 1230   BUN 14 02/27/2018 1230   CREATININE 0.71 02/27/2018 1230   CALCIUM 8.6 (L) 02/27/2018 1230   PROT 5.9 (L) 02/27/2018 1230   ALBUMIN 2.9 (L) 02/27/2018 1230   AST 14 (L) 02/27/2018 1230   ALT 54 (H) 02/27/2018 1230   ALKPHOS 87 02/27/2018 1230   BILITOT 0.5 02/27/2018 1230   GFRNONAA >60 02/27/2018 1230   GFRAA >60 02/27/2018  1230       Component Value Date/Time   WBC 7.8 02/27/2018 1230   WBC 8.8 02/22/2018 1323   RBC 4.21 02/27/2018 1230   HGB 12.8 02/27/2018 1230   HGB 13.1 01/06/2009 1325   HCT  38.6 02/27/2018 1230   HCT 39.2 01/06/2009 1325   PLT 127 (L) 02/27/2018 1353   PLT 102 (L) 02/27/2018 1230   PLT 255 01/06/2009 1325   MCV 91.7 02/27/2018 1230   MCV 95.7 01/06/2009 1325   MCH 30.4 02/27/2018 1230   MCHC 33.2 02/27/2018 1230   RDW 14.6 02/27/2018 1230   RDW 15.1 (H) 01/06/2009 1325   LYMPHSABS 0.5 (L) 02/27/2018 1230   LYMPHSABS 2.0 01/06/2009 1325   MONOABS 0.3 02/27/2018 1230   MONOABS 0.6 01/06/2009 1325   EOSABS 0.0 02/27/2018 1230   EOSABS 0.2 01/06/2009 1325   BASOSABS 0.0 02/27/2018 1230   BASOSABS 0.0 01/06/2009 1325   -------------------------------  Imaging from last 24 hours (if applicable):  Radiology interpretation: Ct Head Wo Contrast  Result Date: 02/17/2018 CLINICAL DATA:  Brain tumor being treated radiation. Imbalance. EXAM: CT HEAD WITHOUT CONTRAST CT MAXILLOFACIAL WITHOUT CONTRAST TECHNIQUE: Multidetector CT imaging of the head and maxillofacial structures were performed using the standard protocol without intravenous contrast. Multiplanar CT image reconstructions of the maxillofacial structures were also generated. COMPARISON:  MRI 01/31/2018 and CT 01/26/2018 FINDINGS: CT HEAD FINDINGS Brain: Redemonstration of a calcified intra-axial mass involving the right cerebellar hemisphere with slight extrinsic mass effect on the adjacent fourth ventricle and vermis from the right. This currently measures 2.5 x 2.8 x 2.6 cm. No additional lesions are noted. Cerebral atrophy with minimal small vessel ischemic disease in the supratentorial compartment is noted. No hydrocephalus is seen. No extra-axial fluid. Vascular: No hyperdense vessel sign. Skull: Intact Other: None CT MAXILLOFACIAL FINDINGS Osseous: Intact maxillofacial bones. No aggressive osseous lesions. Orbits: Bilateral lens replacements. Symmetric appearance of the globes. No retrobulbar abnormality. Sinuses: Mild ethmoid sinus mucosal thickening. The remainder of the paranasal sinuses are clear. Soft  tissues: Left periorbital soft tissue laceration with contusion/swelling. IMPRESSION: 1. 2.5 x 2.8 x 2.6 cm calcified intra-axial mass involving the right cerebellar hemisphere with slight extrinsic mass effect on the adjacent fourth ventricle and vermis from the right. This is stable in appearance consistent solitary metastasis. 2. Left periorbital soft tissue laceration with soft tissue swelling about without underlying fracture. 3. Cerebral atrophy with minimal small vessel ischemic disease. Electronically Signed   By: Ashley Royalty M.D.   On: 02/17/2018 23:37   Nm Pet Image Initial (pi) Skull Base To Thigh  Result Date: 02/13/2018 CLINICAL DATA:  Initial treatment strategy for esophageal cancer. EXAM: NUCLEAR MEDICINE PET SKULL BASE TO THIGH TECHNIQUE: 11.1 mCi F-18 FDG was injected intravenously. Full-ring PET imaging was performed from the skull base to thigh after the radiotracer. CT data was obtained and used for attenuation correction and anatomic localization. Fasting blood glucose: 109 mg/dl COMPARISON:  CT scans of 01/26/2018 and 01/08/2018 FINDINGS: Mediastinal blood pool activity: SUV max 3.0 NECK: The known right cerebellar vermian metastatic lesion is hypoactive compared to the surrounding brain activity. A right level IV lymph node measures 0.6 cm in short axis on image 50/4 and has a maximum SUV of 2.8. Incidental CT findings: There is opacification of a single left ethmoid air cell. Atherosclerotic calcification of both common carotid arteries. CHEST: A long esophageal mass extends from the level of the aortic arch down to the distal esophagus and is diffusely  hypermetabolic with maximum SUV 22.4 A right upper paratracheal node measuring 0.8 cm in short axis on image 57/4 has maximum SUV of 3.2. The right middle lobe pulmonary nodule measuring 1.1 by 1.0 cm on image 86/4 has a maximum SUV of 2.6. Incidental CT findings: Azygos fissure noted. Dense calcification medially in the left breast  without hypermetabolic activity. ABDOMEN/PELVIS: Scattered activity in bowel is likely physiologic. There is some speckled heterogeneity in the liver, for example in the lateral segment left hepatic lobe where maximum SUV is up to 4.3, is post other portions of the liver where the maximum SUV is more in the 3.5 range. There is no supportive CT data of a liver lesion, and overall I suspect that the slight heterogeneity is incidental. Incidental CT findings: Aortoiliac atherosclerotic vascular disease. Mildly contracted gallbladder. Descending and sigmoid colon diverticulosis. Small amount of gas in the urinary bladder, possibly from recent catheterization, correlate with patient history. SKELETON: No significant abnormal hypermetabolic activity in this region. Incidental CT findings: Lower lumbar degenerative facet arthropathy. Sclerosis along the pubis. Old deformity along the left inferior pubic ramus likely from a prior fracture. IMPRESSION: 1. A 15.7 cm in length esophageal mass extends from the aortic arch level down to the distal esophagus, maximum SUV 22.4, compatible with malignancy. There is a small right lower neck level IV lymph node with lower than mediastinal blood pool activity, and a right upper paratracheal lymph node with just above mediastinal blood pool activity. 2. Faint speckled heterogeneity in the liver, for example in the lateral segment left hepatic lobe, is likely incidental/benign given the lack of correlate of CT finding. It might be prudent to obtain an MRI just to make sure that there is not a small early metastatic lesion in this vicinity. 3. Known right cerebellar vermian metastatic lesion, with expected hypo activity compared to the surrounding brain activity. 4. The right middle lobe pulmonary nodule measures 1.1 by 1.0 cm and is mildly hypermetabolic with maximum SUV 2.6. This lesion measured 6 by 5 mm back on 12/06/2003 and 1.2 by 1.1 cm on 10/15/2015. I am uncertain what to make  of the very slow growth and mildly accentuated metabolic activity. This could be a chronic granulomatous process. Low-grade adenocarcinoma of the lung seems unlikely to be so indolent as to only mildly increase over a 14 year period, but is presumably a differential diagnostic consideration. Surveillance is suggested. 5. Other imaging findings of potential clinical significance: Aortic Atherosclerosis (ICD10-I70.0). Descending and sigmoid colon diverticulosis. Gas in the urinary bladder, most common cause would be recent catheterization. Minimal chronic left ethmoid sinusitis. Electronically Signed   By:  Clines M.D.   On: 02/13/2018 17:34   Ir Imaging Guided Port Insertion  Result Date: 02/22/2018 CLINICAL DATA:  Stage IV esophageal carcinoma and history of breast carcinoma. Need for porta cath to begin chemotherapy. EXAM: IMPLANTED PORT A CATH PLACEMENT WITH ULTRASOUND AND FLUOROSCOPIC GUIDANCE ANESTHESIA/SEDATION: 2.0 mg IV Versed; 100 mcg IV Fentanyl Total Moderate Sedation Time:  32 minutes The patient's level of consciousness and physiologic status were continuously monitored during the procedure by Radiology nursing. Additional Medications: 2 g IV Ancef. FLUOROSCOPY TIME:  48 seconds.  16.2 mGy. PROCEDURE: The procedure, risks, benefits, and alternatives were explained to the patient. Questions regarding the procedure were encouraged and answered. The patient understands and consents to the procedure. A time-out was performed prior to initiating the procedure. Ultrasound was utilized to confirm patency of the right internal jugular vein. The right neck  and chest were prepped with chlorhexidine in a sterile fashion, and a sterile drape was applied covering the operative field. Maximum barrier sterile technique with sterile gowns and gloves were used for the procedure. Local anesthesia was provided with 1% lidocaine. After creating a small venotomy incision, a 21 gauge needle was advanced into  the right internal jugular vein under direct, real-time ultrasound guidance. Ultrasound image documentation was performed. After securing guidewire access, an 8 Fr dilator was placed. A J-wire was kinked to measure appropriate catheter length. A subcutaneous port pocket was then created along the upper chest wall utilizing sharp and blunt dissection. Portable cautery was utilized. The pocket was irrigated with sterile saline. A single lumen power injectable port was chosen for placement. The 8 Fr catheter was tunneled from the port pocket site to the venotomy incision. The port was placed in the pocket. External catheter was trimmed to appropriate length based on guidewire measurement. At the venotomy, an 8 Fr peel-away sheath was placed over a guidewire. The catheter was then placed through the sheath and the sheath removed. Final catheter positioning was confirmed and documented with a fluoroscopic spot image. The port was accessed with a needle and aspirated and flushed with heparinized saline. The access needle was removed. The venotomy and port pocket incisions were closed with subcutaneous 3-0 Monocryl and subcuticular 4-0 Vicryl. Dermabond was applied to both incisions. COMPLICATIONS: COMPLICATIONS None FINDINGS: After catheter placement, the tip lies at the cavo-atrial junction. The catheter aspirates normally and is ready for immediate use. IMPRESSION: Placement of single lumen port a cath via right internal jugular vein. The catheter tip lies at the cavo-atrial junction. A power injectable port a cath was placed and is ready for immediate use. Electronically Signed   By: Aletta Edouard M.D.   On: 02/22/2018 16:57   Vas [place] Lower Extremity Venous (dvt)  Result Date: 02/28/2018  Lower Venous Study Indications: Swelling.  Performing Technologist: Abram Sander RVS  Examination Guidelines: A complete evaluation includes B-mode imaging, spectral Doppler, color Doppler, and power Doppler as needed of all  accessible portions of each vessel. Bilateral testing is considered an integral part of a complete examination. Limited examinations for reoccurring indications may be performed as noted.  Right Venous Findings: +---+---------------+---------+-----------+----------+-------+    CompressibilityPhasicitySpontaneityPropertiesSummary +---+---------------+---------+-----------+----------+-------+ CFVFull           Yes      Yes                          +---+---------------+---------+-----------+----------+-------+  Left Venous Findings: +---------+---------------+---------+-----------+----------+--------------+          CompressibilityPhasicitySpontaneityPropertiesSummary        +---------+---------------+---------+-----------+----------+--------------+ CFV      Full           Yes      Yes                                 +---------+---------------+---------+-----------+----------+--------------+ SFJ      Full                                                        +---------+---------------+---------+-----------+----------+--------------+ FV Prox  Full                                                        +---------+---------------+---------+-----------+----------+--------------+  FV Mid   Full                                                        +---------+---------------+---------+-----------+----------+--------------+ FV DistalFull                                                        +---------+---------------+---------+-----------+----------+--------------+ PFV      Full                                                        +---------+---------------+---------+-----------+----------+--------------+ POP      None           No       No                   Acute          +---------+---------------+---------+-----------+----------+--------------+ PTV      None                                         Acute           +---------+---------------+---------+-----------+----------+--------------+ PERO                                                  Not visualized +---------+---------------+---------+-----------+----------+--------------+ Gastroc  None                                                        +---------+---------------+---------+-----------+----------+--------------+ SSV      None                                                        +---------+---------------+---------+-----------+----------+--------------+    Summary: Right: No evidence of common femoral vein obstruction. Left: Findings consistent with acute deep vein thrombosis involving the left popliteal vein, left posterior tibial vein, and left gastrocnemius vein. Findings consistent with acute superficial vein thrombosis involving the left small saphenous vein.  *See table(s) above for measurements and observations. Electronically signed by [name] MD on 02/28/2018 at 4:32:50 PM.    Final    Ct Maxillofacial Wo Cm  Result Date: 02/17/2018 CLINICAL DATA:  Brain tumor being treated radiation. Imbalance. EXAM: CT HEAD WITHOUT CONTRAST CT MAXILLOFACIAL WITHOUT CONTRAST TECHNIQUE: Multidetector CT imaging of the head and maxillofacial structures were performed using the standard protocol without intravenous contrast. Multiplanar CT image reconstructions of the maxillofacial structures were also generated. COMPARISON:  MRI 01/31/2018 and CT 01/26/2018 FINDINGS: CT HEAD FINDINGS Brain: Redemonstration of a calcified intra-axial mass involving the right cerebellar hemisphere with slight extrinsic mass effect on the adjacent fourth ventricle and vermis from the right. This currently measures 2.5 x 2.8 x 2.6 cm. No additional lesions are noted. Cerebral atrophy with minimal small vessel ischemic disease in the supratentorial compartment is noted. No hydrocephalus is seen. No extra-axial fluid. Vascular: No hyperdense vessel sign.  Skull: Intact Other: None CT MAXILLOFACIAL FINDINGS Osseous: Intact maxillofacial bones. No aggressive osseous lesions. Orbits: Bilateral lens replacements. Symmetric appearance of the globes. No retrobulbar abnormality. Sinuses: Mild ethmoid sinus mucosal thickening. The remainder of the paranasal sinuses are clear. Soft tissues: Left periorbital soft tissue laceration with contusion/swelling. IMPRESSION: 1. 2.5 x 2.8 x 2.6 cm calcified intra-axial mass involving the right cerebellar hemisphere with slight extrinsic mass effect on the adjacent fourth ventricle and vermis from the right. This is stable in appearance consistent solitary metastasis. 2. Left periorbital soft tissue laceration with soft tissue swelling about without underlying fracture. 3. Cerebral atrophy with minimal small vessel ischemic disease. Electronically Signed   By: Ashley Royalty M.D.   On: 02/17/2018 23:37        This case was discussed with Dr. Burr Medico. She expressed agreement with my management of this patient.

## 2018-03-03 ENCOUNTER — Inpatient Hospital Stay: Payer: Medicare Other

## 2018-03-03 ENCOUNTER — Ambulatory Visit
Admission: RE | Admit: 2018-03-03 | Discharge: 2018-03-03 | Disposition: A | Discharge status: Home / Self Care | Payer: Medicare Other | Source: Ambulatory Visit | Attending: Radiation Oncology | Admitting: Radiation Oncology

## 2018-03-03 ENCOUNTER — Telehealth: Payer: Self-pay | Admitting: Medical

## 2018-03-03 ENCOUNTER — Other Ambulatory Visit: Payer: Self-pay | Admitting: Medical

## 2018-03-03 ENCOUNTER — Other Ambulatory Visit: Payer: Self-pay | Admitting: Hematology

## 2018-03-03 DIAGNOSIS — I824Z2 Acute embolism and thrombosis of unspecified deep veins of left distal lower extremity: Secondary | ICD-10-CM

## 2018-03-03 DIAGNOSIS — C154 Malignant neoplasm of middle third of esophagus: Secondary | ICD-10-CM | POA: Diagnosis not present

## 2018-03-03 DIAGNOSIS — Z510 Encounter for antineoplastic radiation therapy: Secondary | ICD-10-CM | POA: Diagnosis not present

## 2018-03-03 DIAGNOSIS — I82432 Acute embolism and thrombosis of left popliteal vein: Secondary | ICD-10-CM

## 2018-03-03 DIAGNOSIS — Z5111 Encounter for antineoplastic chemotherapy: Secondary | ICD-10-CM | POA: Diagnosis not present

## 2018-03-03 DIAGNOSIS — C50911 Malignant neoplasm of unspecified site of right female breast: Secondary | ICD-10-CM | POA: Diagnosis not present

## 2018-03-03 DIAGNOSIS — Z853 Personal history of malignant neoplasm of breast: Secondary | ICD-10-CM | POA: Diagnosis not present

## 2018-03-03 DIAGNOSIS — C159 Malignant neoplasm of esophagus, unspecified: Secondary | ICD-10-CM | POA: Diagnosis not present

## 2018-03-03 DIAGNOSIS — Z170 Estrogen receptor positive status [ER+]: Secondary | ICD-10-CM | POA: Diagnosis not present

## 2018-03-03 DIAGNOSIS — C7931 Secondary malignant neoplasm of brain: Secondary | ICD-10-CM | POA: Diagnosis not present

## 2018-03-03 DIAGNOSIS — G629 Polyneuropathy, unspecified: Secondary | ICD-10-CM | POA: Diagnosis not present

## 2018-03-03 LAB — CMP (CANCER CENTER ONLY)
ALT: 64 U/L — ABNORMAL HIGH (ref 0–44)
AST: 22 U/L (ref 15–41)
Albumin: 3 g/dL — ABNORMAL LOW (ref 3.5–5.0)
Alkaline Phosphatase: 92 U/L (ref 38–126)
Anion gap: 8 (ref 5–15)
BUN: 19 mg/dL (ref 8–23)
CO2: 25 mmol/L (ref 22–32)
Calcium: 8.8 mg/dL — ABNORMAL LOW (ref 8.9–10.3)
Chloride: 99 mmol/L (ref 98–111)
Creatinine: 0.72 mg/dL (ref 0.44–1.00)
GFR, Est AFR Am: >60 mL/min (ref >60)
GFR, Estimated: >60 mL/min (ref >60)
GLUCOSE: 145 mg/dL — AB (ref 70–99)
Potassium: 5.1 mmol/L (ref 3.5–5.1)
Sodium: 132 mmol/L — ABNORMAL LOW (ref 135–145)
TOTAL PROTEIN: 6.2 g/dL — AB (ref 6.5–8.1)
Total Bilirubin: 0.5 mg/dL (ref 0.3–1.2)

## 2018-03-03 LAB — CBC WITH DIFFERENTIAL (CANCER CENTER ONLY)
ABS IMMATURE GRANULOCYTES: 0.06 10*3/uL (ref 0.00–0.07)
BASOS PCT: 0 %
Basophils Absolute: 0 10*3/uL (ref 0.0–0.1)
Eosinophils Absolute: 0 10*3/uL (ref 0.0–0.5)
Eosinophils Relative: 0 %
HCT: 40.4 % (ref 36.0–46.0)
Hemoglobin: 13.1 g/dL (ref 12.0–15.0)
Immature Granulocytes: 1 %
Lymphocytes Relative: 4 %
Lymphs Abs: 0.2 10*3/uL — ABNORMAL LOW (ref 0.7–4.0)
MCH: 30.3 pg (ref 26.0–34.0)
MCHC: 32.4 g/dL (ref 30.0–36.0)
MCV: 93.5 fL (ref 80.0–100.0)
Monocytes Absolute: 0.1 10*3/uL (ref 0.1–1.0)
Monocytes Relative: 2 %
NEUTROS ABS: 4.7 10*3/uL (ref 1.7–7.7)
Neutrophils Relative %: 93 %
Platelet Count: 159 10*3/uL (ref 150–400)
RBC: 4.32 MIL/uL (ref 3.87–5.11)
RDW: 14.6 % (ref 11.5–15.5)
WBC Count: 5.1 10*3/uL (ref 4.0–10.5)
nRBC: 0.4 % — ABNORMAL HIGH (ref 0.0–0.2)

## 2018-03-03 LAB — PROTIME-INR
INR: 0.95
Prothrombin Time: 12.5 seconds (ref 11.4–15.2)

## 2018-03-03 MED ORDER — ENOXAPARIN SODIUM 150 MG/ML ~~LOC~~ SOLN
140.0000 mg | Freq: Once | SUBCUTANEOUS | Status: AC
Start: 2018-03-03 — End: 2018-03-03
  Administered 2018-03-03: 140 mg via SUBCUTANEOUS
  Filled 2018-03-03: qty 0.93

## 2018-03-03 MED ORDER — ENOXAPARIN SODIUM 150 MG/ML ~~LOC~~ SOLN: 140.0000 mg | SUBCUTANEOUS | 0 refills | Status: DC

## 2018-03-03 NOTE — Patient Instructions (Signed)
Enoxaparin injection What is this medicine? ENOXAPARIN (ee nox a PA rin) is used after knee, hip, or abdominal surgeries to prevent blood clotting. It is also used to treat existing blood clots in the lungs or in the veins. This medicine may be used for other purposes; ask your health care provider or pharmacist if you have questions. COMMON BRAND NAME(S): Lovenox What should I tell my health care provider before I take this medicine? They need to know if you have any of these conditions: -bleeding disorders, hemorrhage, or hemophilia -infection of the heart or heart valves -kidney or liver disease -previous stroke -prosthetic heart valve -recent surgery or delivery of a baby -ulcer in the stomach or intestine, diverticulitis, or other bowel disease -an unusual or allergic reaction to enoxaparin, heparin, pork or pork products, other medicines, foods, dyes, or preservatives -pregnant or trying to get pregnant -breast-feeding How should I use this medicine? This medicine is for injection under the skin. It is usually given by a health-care professional. You or a family member may be trained on how to give the injections. If you are to give yourself injections, make sure you understand how to use the syringe, measure the dose if necessary, and give the injection. To avoid bruising, do not rub the site where this medicine has been injected. Do not take your medicine more often than directed. Do not stop taking except on the advice of your doctor or health care professional. Make sure you receive a puncture-resistant container to dispose of the needles and syringes once you have finished with them. Do not reuse these items. Return the container to your doctor or health care professional for proper disposal. Talk to your pediatrician regarding the use of this medicine in children. Special care may be needed. Overdosage: If you think you have taken too much of this medicine contact a poison control  center or emergency room at once. NOTE: This medicine is only for you. Do not share this medicine with others. What if I miss a dose? If you miss a dose, take it as soon as you can. If it is almost time for your next dose, take only that dose. Do not take double or extra doses. What may interact with this medicine? -aspirin and aspirin-like medicines -certain medicines that treat or prevent blood clots -dipyridamole -NSAIDs, medicines for pain and inflammation, like ibuprofen or naproxen This list may not describe all possible interactions. Give your health care provider a list of all the medicines, herbs, non-prescription drugs, or dietary supplements you use. Also tell them if you smoke, drink alcohol, or use illegal drugs. Some items may interact with your medicine. What should I watch for while using this medicine? Visit your healthcare professional for regular checks on your progress. You may need blood work done while you are taking this medicine. Your condition will be monitored carefully while you are receiving this medicine. It is important not to miss any appointments. If you are going to need surgery or other procedure, tell your healthcare professional that you are using this medicine. Using this medicine for a long time may weaken your bones and increase the risk of bone fractures. Avoid sports and activities that might cause injury while you are using this medicine. Severe falls or injuries can cause unseen bleeding. Be careful when using sharp tools or knives. Consider using an electric razor. Take special care brushing or flossing your teeth. Report any injuries, bruising, or red spots on the skin to your healthcare   professional. Wear a medical ID bracelet or chain. Carry a card that describes your disease and details of your medicine and dosage times. What side effects may I notice from receiving this medicine? Side effects that you should report to your doctor or health care  professional as soon as possible: -allergic reactions like skin rash, itching or hives, swelling of the face, lips, or tongue -bone pain -signs and symptoms of bleeding such as bloody or black, tarry stools; red or dark-brown urine; spitting up blood or brown material that looks like coffee grounds; red spots on the skin; unusual bruising or bleeding from the eye, gums, or nose -signs and symptoms of a blood clot such as chest pain; shortness of breath; pain, swelling, or warmth in the leg -signs and symptoms of a stroke such as changes in vision; confusion; trouble speaking or understanding; severe headaches; sudden numbness or weakness of the face, arm or leg; trouble walking; dizziness; loss of coordination Side effects that usually do not require medical attention (report to your doctor or health care professional if they continue or are bothersome): -hair loss -pain, redness, or irritation at site where injected This list may not describe all possible side effects. Call your doctor for medical advice about side effects. You may report side effects to FDA at 1-800-FDA-1088. Where should I keep my medicine? Keep out of the reach of children. Store at room temperature between 15 and 30 degrees C (59 and 86 degrees F). Do not freeze. If your injections have been specially prepared, you may need to store them in the refrigerator. Ask your pharmacist. Throw away any unused medicine after the expiration date. NOTE: This sheet is a summary. It may not cover all possible information. If you have questions about this medicine, talk to your doctor, pharmacist, or health care provider.  2019 [employer] (2017-02-24 11:25:34)  

## 2018-03-03 NOTE — Telephone Encounter (Signed)
An INR returned at 0.95 despite having been on Coumadin at 5 mg x 3 days.The patient was instructed to increase her Coumadin to 7.5 mg daily x3 days and continue Lovenox 140 mcg on Saturday and Sunday.PT/INR will be rechecked on Monday.  Sandi Mealy, MHS, PA-C Physician Assistant

## 2018-03-05 NOTE — Progress Notes (Signed)
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    01/30/2018 Initial Diagnosis    Metastasis to brain [facility])    01/31/2018 Imaging    MRI Brain W WO Contrast 01/31/18  IMPRESSION: 3 Tesla study does not disclose any additional lesions. Solitary metastasis in the right superior cerebellum measuring 2.9 x 2.6 x 3.0 cm with mild surrounding edema. 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    02/03/2018 - 02/08/2018 Radiation Therapy    SBRT with Dr. Isidore Moos on 02/03/18, 02/06/18, 02/08/18     Esophageal cancer, stage IV ([place])   01/08/2018 Imaging    CT AP W Constrast 01/08/18  IMPRESSION: 1. Nonobstructed, nondistended bowel. No acute inflammatory process. 2. Moderate-sized hiatal hernia noted. 3. Low-density 1.7 cm left adrenal nodule likely to represent a small adenoma. Lower pole left renal cyst measuring 1.8 cm. 4. Moderate aortic atherosclerosis. 5. Lumbar spondylosis and facet arthropathy.    01/27/2018 Imaging    CT Chest W Contrast 01/27/18  IMPRESSION: 1. Large mass involving the distal half of the esophagus spanning approximately 11.7 cm is identified compatible with primary esophageal neoplasm. 2. Small  posterior mediastinal and high right paratracheal lymph nodes are noted which may represent foci of metastatic adenopathy. 3. The small pulmonary nodules in the right lung are unchanged from 01/15/2016 and are favored to represent a benign process. 4. Aortic Atherosclerosis (ICD10-I70.0) and Emphysema (ICD10-J43.9). 5. Coronary artery atherosclerotic calcifications.    01/30/2018 Initial Diagnosis    Esophageal cancer, stage IV ([place])    02/03/2018 - 02/08/2018 Radiation Therapy    SBRT to oligo brain metastasis, in 3 fractions, under Dr. Isidore Moos     02/16/2018 PET scan    PET 02/16/18 IMPRESSION: 1. A 15.7 cm in length esophageal mass extends from the aortic arch level down to the distal esophagus, maximum SUV 22.4, compatible with malignancy. There is a small right lower neck level IV lymph node with lower than mediastinal blood pool activity, and a right upper paratracheal lymph node with just above mediastinal blood pool activity. 2. Faint speckled heterogeneity in the liver, for example in the lateral segment left hepatic lobe, is likely incidental/benign given the lack of correlate of CT finding. It might be prudent to obtain an MRI just to make sure that there is not a small early metastatic lesion in this vicinity. 3. Known right cerebellar vermian metastatic lesion, with expected hypo activity compared to the surrounding brain activity. 4. The right middle lobe pulmonary nodule measures 1.1 by 1.0 cm and is mildly hypermetabolic with maximum SUV 2.6. This lesion measured 6 by 5 mm back on 12/06/2003 and 1.2 by 1.1 cm on 10/15/2015. I am uncertain what to make of the very slow growth and mildly accentuated metabolic activity. This could  be a chronic granulomatous process. Low-grade adenocarcinoma of the lung seems unlikely to be so indolent as to only mildly increase over a 14 year period, but is presumably a differential diagnostic consideration. Surveillance is  suggested. 5. Other imaging findings of potential clinical significance: Aortic Atherosclerosis (ICD10-I70.0). Descending and sigmoid colon diverticulosis. Gas in the urinary bladder, most common cause would be recent catheterization. Minimal chronic left ethmoid sinusitis.     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 Ultrasound-guided biopsy is recommended.    01/10/2018 Initial Biopsy    Diagnsis from Breast Biopsy 01/10/18  Breast, right, needle core biopsy, 6 o'clock - INVASIVE DUCTAL CARCINOMA WITH CALCIFICATIONS, SEE COMMENT. - DUCTAL CARCINOMA IN SITU. 1 of 2 FINAL for Kristin, ZARLING [phone]) Microscopic Comment The carcinoma appears grade 2. Prognostic markers will be ordered. Dr. [name] has reviewed the case. The case was called to [facility] on 01/11/2018.    01/10/2018 Receptors her2    The tumor cells are NEGATIVE for Her2 (1+). Estrogen Receptor: 100%, POSITIVE, STRONG STAINING INTENSITY Progesterone Receptor: 100%, POSITIVE, STRONG STAINING INTENSITY Proliferation Marker Ki67: 15%    01/10/2018 Cancer Staging    Staging form: Breast, AJCC 8th Edition - Clinical stage from 01/10/2018: Stage IA (cT1b, cN0, cM0, G2, ER+, PR+, HER2-) - Signed by [name], MD on 02/03/2018    01/30/2018 Initial Diagnosis    Breast cancer ([place])     CURRENT THERAPY: weekly carboplatin and taxol starting on 02/27/2018  INTERVAL HISTORY: Kristin Williams presents for follow up. Kristin Williams is accompanied by Kristin Williams family. Family notes that Kristin Williams fell at home and struck Kristin Williams left sided head on a bookcase. Kristin Williams was  evaluated in the ED and had a CT scan. Kristin Williams ambulates with a walker now. Kristin Williams wasn't on blood thinners at the time. Kristin Williams is taking coumadin daily and Kristin Williams has been increased. Kristin Williams had 6 injections of lovenox recently this past weekend and Kristin Williams blood work was repeated today.   Kristin Williams has a dry area inside Kristin Williams nostrils and notes that Kristin Williams has mild blood noted when Kristin Williams blows Kristin Williams nose.   Kristin Williams started on radiation last week and has no complaints at this time.   On review of systems, Kristin Williams reports bruising to the site of the lovenox injections, leg swelling right> left, cough and intermittent trouble swallowing. Kristin Williams denies choking, appetite change, and any other symptoms. Pertinent positives are listed and detailed within the above HPI.   REVIEW OF SYSTEMS:   Constitutional: Denies fevers, chills or abnormal weight loss Eyes: Denies blurriness of vision Ears, nose, mouth, throat, and face: Denies mucositis or sore throat Respiratory: Denies cough, dyspnea or wheezes Cardiovascular: Denies palpitation, chest discomfort or lower extremity swelling Gastrointestinal:  Denies nausea, heartburn or change in bowel habits Skin: Denies abnormal skin rashes Lymphatics: Denies new lymphadenopathy or easy bruising Neurological:Denies numbness, tingling or new weaknesses Behavioral/Psych: Mood is stable, no new changes  All other systems were reviewed with the patient and are negative.  MEDICAL HISTORY:  Past Medical History:  Diagnosis Date  . Arthritis   . Cancer ([place])  Cervical  . Cancer ([place])    Breast  . Cancer ([place])    Vaginal  . Complication of anesthesia   . COPD (chronic obstructive pulmonary disease) ([place])   . Headache(784.0)   . Hyperlipidemia   . Leg pain   . Peripheral vascular disease ([place])   . Personal history of chemotherapy   . Personal history of radiation therapy   . PONV (postoperative nausea and vomiting)     SURGICAL HISTORY: Past Surgical History:  Procedure Laterality Date  .  ABDOMINAL HYSTERECTOMY  1975  . BREAST LUMPECTOMY  2005  . GANGLION CYST EXCISION  1977  . ILIAC ARTERY STENT  04/20/2004   CSD right iliac occlusive disease  . IR IMAGING GUIDED PORT INSERTION  02/22/2018    I have reviewed the social history and family history with the patient and they are unchanged from previous note.  ALLERGIES:  is allergic to flonase [fluticasone propionate].  MEDICATIONS:  Current Outpatient Medications  Medication Sig Dispense Refill  . acetaminophen (TYLENOL) 160 MG/5ML liquid Take 325 mg by mouth every 4 (four) hours as needed for fever.    Marland Kitchen dexamethasone (DECADRON) 4 MG tablet Take 1 tablet (4 mg total) by mouth daily. Take 4 mg daily for 2 to 3 days after chemotherapy treatment 30 tablet 1  . enoxaparin (LOVENOX) 150 MG/ML injection Inject 0.93 mLs (140 mg total) into the skin daily for 2 days. 12/21 and 12/22 2 Syringe 0  . lidocaine (XYLOCAINE) 2 % solution Patient: Mix 1part 2% viscous lidocaine, 1part H20. Swallow 65m of diluted mixture, 231m before meals and at bedtime, up to QID 100 mL 5  . lidocaine-prilocaine (EMLA) cream Apply 1 application topically as needed. 30 g 1  . LORazepam (ATIVAN) 1 MG tablet Take 1 tablet (1 mg total) by mouth 3 (three) times daily as needed for anxiety. 15 tablet 0  . metoCLOPramide (REGLAN) 10 MG tablet Take 1 tablet (10 mg total) by mouth every 6 (six) hours as needed for nausea. 60 tablet 2  . omeprazole (PRILOSEC) 20 MG capsule Take 40 mg by mouth daily.     . ondansetron (ZOFRAN) 8 MG tablet Take 1 tablet (8 mg total) by mouth every 8 (eight) hours as needed for nausea or vomiting. 20 tablet 0  . prochlorperazine (COMPAZINE) 10 MG tablet Take 1 tablet (10 mg total) by mouth every 6 (six) hours as needed for nausea or vomiting. 30 tablet 0  . sucralfate (CARAFATE) 1 g tablet Dissolve 1 tablet in 10 mL H20 and swallow up to QID to soothe throat. 40 tablet 5  . warfarin (COUMADIN) 2.5 MG tablet Take 2 tablets once daily  with supper and then as directed by MD 90 tablet 2   No current facility-administered medications for this visit.     PHYSICAL EXAMINATION: ECOG PERFORMANCE STATUS: 3 - Symptomatic, >50% confined to bed Blood pressure 104/58, heart rate 88, respiration 19, temperature 36.4, pulse ox 98% on room air GENERAL:alert, no distress and comfortable SKIN: skin color, texture, turgor are normal, no rashes or significant lesions except ecchymosis around left eye from previous fall EYES: normal, Conjunctiva are pink and non-injected, sclera clear OROPHARYNX:no exudate, no erythema and lips, buccal mucosa, and tongue normal  NECK: supple, thyroid normal size, non-tender, without nodularity LYMPH:  no palpable lymphadenopathy in the cervical, axillary or inguinal LUNGS: clear to auscultation and percussion with normal breathing effort HEART: regular rate & rhythm and no murmurs and no lower extremity edema ABDOMEN:abdomen  soft, non-tender and normal bowel sounds Musculoskeletal:no cyanosis of digits and no clubbing  NEURO: alert & oriented x 3 with fluent speech, no focal motor/sensory deficits  LABORATORY DATA:  I have reviewed the data as listed CBC Latest Ref Rng & Units 03/06/2018 03/03/2018 02/27/2018  WBC 4.0 - 10.5 K/uL 3.4(L) 5.1 7.8  Hemoglobin 12.0 - 15.0 g/dL 11.8(L) 13.1 12.8  Hematocrit 36.0 - 46.0 % 35.9(L) 40.4 38.6  Platelets 150 - 400 K/uL 152 159 127(L)     CMP Latest Ref Rng & Units 03/06/2018 03/03/2018 02/27/2018  Glucose 70 - 99 mg/dL 83 145(H) 131(H)  BUN 8 - 23 mg/dL _0 Creatinine 0.44 - 1.00 mg/dL 0.66 0.72 0.71  Sodium 135 - 145 mmol/L 133(L) 132(L) 135  Potassium 3.5 - 5.1 mmol/L 4.6 5.1 4.6  Chloride 98 - 111 mmol/L 100 99 100  CO2 22 - 32 mmol/L _1 Calcium 8.9 - 10.3 mg/dL 8.5(L) 8.8(L) 8.6(L)  Total Protein 6.5 - 8.1 g/dL 5.6(L) 6.2(L) 5.9(L)  Total Bilirubin 0.3 - 1.2 mg/dL 0.3 0.5 0.5  Alkaline Phos 38 - 126 U/L 89 92 87  AST 15 - 41 U/L 21 22  14(L)  ALT 0 - 44 U/L 66(H) 64(H) 54(H)   ANC 2.7K today   RADIOGRAPHIC STUDIES: I have personally reviewed the radiological images as listed and agreed with the findings in the report. No results found.   ASSESSMENT & PLAN:  No problem-specific Assessment & Plan notes found for this encounter. SHAMIKA PEDREGON is a 72 y.o. female with   1. EsophagealAdenocarcinoma,with oligo met incerebellum, stage IV  -Kristin Williams was recently diagnosed with esophogeal cancer, with oligo metastasis to the brain. Given Kristin Williams stage IV disease Kristin Williams cancer is not curable but still treatable. Goal of care it to control Kristin Williams disease.   -Kristin Williams completed SBRT to Kristin Williams brain met on 02/08/2018.  -I personally reviewed Kristin Williams PET from 02/16/18 and discussed with pt and Kristin Williams family, it showed lung nodule Kristin Williams had since 2017 and has been getting smaller, likely benign. Kristin Williams has a benign cysts in Kristin Williams kidney. There is a mildly hypermetabolic right supraclavicular LN. No other evidence of distant metastasis. Kristin Williams primary esophageal mass is very long and hot on PET, along with a hypermetabolic right upper paratracheal node.  -Kristin Williams started chemo RT on 02/27/2018 with weekly carboplatin and taxol for better local disease control. Will continue to monitor Kristin Williams labs weekly -Given the brain metastasis, surgical resection is not recommended. Kristin Williams case was previously discussed in our GI tumor board. I previously discussed Kristin Williams prognosis for Kristin Williams aggressive disease. Kristin Williams understands there is a very small chance of complete response with chemoRT. Goal of care is to control Kristin Williams disease.  -Continue ChemoRT -if Kristin Williams can tolerate, I will consider FOLFOX 2-3 months as consolidation after chemoRT  -Patient to follow up with labs and chemo treatment on next week.    2. RightBreastInvasive Ductal Carcinoma,cT1bN0M0 stage IA,Grade II, ER/PR Positive, Kristin Williams 2 negative -Diagnosed through screening mammogram, same time as Kristin Williams esophageal cancer in October 2019.    -Given Kristin Williams early stage breast cancer, I previously discussedher breast cancer treatment is less urgent than esophogeal cancertreatment. -I plan tostart Kristin Williams onanti-estrogen therapyafter Kristin Williams completes chemoradiation for esophageal cancer, given Kristin Williams ER/PR positive cancer.If shedoes very well in 6 to 12 monthswithoutesophageal cancer without progression,we mayconsider breast surgery.   3.Oligo brain mets -This is likely from Kristin Williams primaryesophogealcancer.  -Kristin Williams does have balance issues and ambulates with  assistance. Kristin Williams knows Kristin Williams is at risk for fall and that Kristin Williams should be careful. -Kristin Williams is s/p SBRT. Has mild slurred speech and balance stability has slightly improved. I discussed this should improve post-radiation.  -Will monitor with brain scan every 3 months.  4. H/o Left breast cancer, ER/PR negative, treated by Dr. Jana Hakim with lumpectomy, adj, chemo and radiation. -Kristin Williams was previously referred to genetics  -no evidence of recurrence    5. Dysphagia, nausea, low appetite  -secondary to Kristin Williams esophageal cancer and chemoradiation.   -No pain today, Kristin Williams will continue with soft food diet  -Kristin Williams does not like ensure or boost. Kristin Williams will try carnation breakfast and juicing Kristin Williams food. I suggest Kristin Williams also try diluting supplement. I strongly encouraged Kristin Williams to increase Kristin Williams calorie intake.   -Continue liquid Tylenol before eating. -Kristin Williams does not tolerate Compazine. Kristin Williams has Zofran but Reglan gives Kristin Williams the best relief. Nausea is currently controlled -Kristin Williams is on taper dose Dexa, currently at 29m daily. Plans to be off 03/10/18.   -we discussed OK to use dexa 462mdaily for 3-5 days after chemo  -I think Kristin Williams did not need a feeding tube for now, we previously discussed Kristin Williams dysphagia and odynophagia make it worse during chemo and radiation, and the possibility Kristin Williams may needs a feeding tube.  6. Acute deep vein thrombosis (DVT) of popliteal vein of left lower extremity, diagnosed on 02/28/2018   -Kristin Williams was found to have left lower extremity edema on his first day of chemoradiation, Doppler showed DVT -Due to the risk of GI bleeding and intracranial hemorrhage from brain metastasis, will start Kristin Williams on Coumadin with Lovenox bridging, dose was increased to 7.5 mg daily 3 days ago, INR 1.9 today.  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   8.Goal of care discussion  -We previously discussed the incurable nature of Kristin Williams cancer, and the overall poor prognosis, especially if Kristin Williams does not have good response to chemotherapy or progress on chemo -The patient understands the goal of care is palliative. -Kristin Williams is full code now  PLAN: -Lab reviewed, adequate for treatment, continue ChemoRT -Continue Coumadin at 7.5 mg daily, repeat INR on 12/26 and next week  -Patient to follow up with labs and chemo treatment on next week.     No orders of the defined types were placed in this encounter.  All questions were answered. The patient knows to call the clinic with any problems, questions or concerns. No barriers to learning was detected. I spent 25 minutes counseling the patient face to face. The total time spent in the appointment was 30 minutes and more than 50% was on counseling and review of test results     YaTruitt MerleMD 03/06/2018    I, Soijett Blue am acting as scribe for Dr. YaTruitt Merle I have reviewed the above documentation for accuracy and completeness, and I agree with the above.

## 2018-03-06 ENCOUNTER — Inpatient Hospital Stay: Payer: Medicare Other

## 2018-03-06 ENCOUNTER — Inpatient Hospital Stay (HOSPITAL_BASED_OUTPATIENT_CLINIC_OR_DEPARTMENT_OTHER): Payer: Medicare Other | Admitting: Hematology

## 2018-03-06 ENCOUNTER — Ambulatory Visit
Admission: RE | Admit: 2018-03-06 | Discharge: 2018-03-06 | Disposition: A | Discharge status: Home / Self Care | Payer: Medicare Other | Source: Ambulatory Visit | Attending: Radiation Oncology | Admitting: Radiation Oncology

## 2018-03-06 ENCOUNTER — Other Ambulatory Visit: Payer: Self-pay | Admitting: Radiation Oncology

## 2018-03-06 ENCOUNTER — Telehealth: Payer: Self-pay | Admitting: Hematology

## 2018-03-06 ENCOUNTER — Encounter: Payer: Self-pay | Admitting: Hematology

## 2018-03-06 VITALS — BP 104/58 | HR 88 | Temp 97.6°F | Resp 19

## 2018-03-06 DIAGNOSIS — G629 Polyneuropathy, unspecified: Secondary | ICD-10-CM

## 2018-03-06 DIAGNOSIS — Z7189 Other specified counseling: Secondary | ICD-10-CM

## 2018-03-06 DIAGNOSIS — Z853 Personal history of malignant neoplasm of breast: Secondary | ICD-10-CM | POA: Diagnosis not present

## 2018-03-06 DIAGNOSIS — R05 Cough: Secondary | ICD-10-CM

## 2018-03-06 DIAGNOSIS — C159 Malignant neoplasm of esophagus, unspecified: Secondary | ICD-10-CM

## 2018-03-06 DIAGNOSIS — C50911 Malignant neoplasm of unspecified site of right female breast: Secondary | ICD-10-CM | POA: Diagnosis not present

## 2018-03-06 DIAGNOSIS — C154 Malignant neoplasm of middle third of esophagus: Secondary | ICD-10-CM

## 2018-03-06 DIAGNOSIS — Z510 Encounter for antineoplastic radiation therapy: Secondary | ICD-10-CM | POA: Diagnosis not present

## 2018-03-06 DIAGNOSIS — R58 Hemorrhage, not elsewhere classified: Secondary | ICD-10-CM | POA: Diagnosis not present

## 2018-03-06 DIAGNOSIS — I824Z2 Acute embolism and thrombosis of unspecified deep veins of left distal lower extremity: Secondary | ICD-10-CM

## 2018-03-06 DIAGNOSIS — Z9181 History of falling: Secondary | ICD-10-CM | POA: Diagnosis not present

## 2018-03-06 DIAGNOSIS — C7931 Secondary malignant neoplasm of brain: Secondary | ICD-10-CM

## 2018-03-06 DIAGNOSIS — Z170 Estrogen receptor positive status [ER+]: Secondary | ICD-10-CM | POA: Diagnosis not present

## 2018-03-06 DIAGNOSIS — R1310 Dysphagia, unspecified: Secondary | ICD-10-CM | POA: Diagnosis not present

## 2018-03-06 DIAGNOSIS — I82432 Acute embolism and thrombosis of left popliteal vein: Secondary | ICD-10-CM | POA: Diagnosis not present

## 2018-03-06 DIAGNOSIS — Z5111 Encounter for antineoplastic chemotherapy: Secondary | ICD-10-CM | POA: Diagnosis not present

## 2018-03-06 DIAGNOSIS — Z95828 Presence of other vascular implants and grafts: Secondary | ICD-10-CM

## 2018-03-06 LAB — CBC WITH DIFFERENTIAL (CANCER CENTER ONLY)
Abs Immature Granulocytes: 0.15 10*3/uL — ABNORMAL HIGH (ref 0.00–0.07)
Basophils Absolute: 0 10*3/uL (ref 0.0–0.1)
Basophils Relative: 0 %
Eosinophils Absolute: 0 10*3/uL (ref 0.0–0.5)
Eosinophils Relative: 1 %
HEMATOCRIT: 35.9 % — AB (ref 36.0–46.0)
Hemoglobin: 11.8 g/dL — ABNORMAL LOW (ref 12.0–15.0)
Immature Granulocytes: 4 %
Lymphocytes Relative: 9 %
Lymphs Abs: 0.3 10*3/uL — ABNORMAL LOW (ref 0.7–4.0)
MCH: 30.3 pg (ref 26.0–34.0)
MCHC: 32.9 g/dL (ref 30.0–36.0)
MCV: 92.3 fL (ref 80.0–100.0)
Monocytes Absolute: 0.3 10*3/uL (ref 0.1–1.0)
Monocytes Relative: 8 %
NEUTROS PCT: 78 %
Neutro Abs: 2.7 10*3/uL (ref 1.7–7.7)
Platelet Count: 152 10*3/uL (ref 150–400)
RBC: 3.89 MIL/uL (ref 3.87–5.11)
RDW: 14.5 % (ref 11.5–15.5)
WBC Count: 3.4 10*3/uL — ABNORMAL LOW (ref 4.0–10.5)
nRBC: 0.6 % — ABNORMAL HIGH (ref 0.0–0.2)

## 2018-03-06 LAB — CMP (CANCER CENTER ONLY)
ALT: 66 U/L — ABNORMAL HIGH (ref 0–44)
AST: 21 U/L (ref 15–41)
Albumin: 2.7 g/dL — ABNORMAL LOW (ref 3.5–5.0)
Alkaline Phosphatase: 89 U/L (ref 38–126)
Anion gap: 7 (ref 5–15)
BILIRUBIN TOTAL: 0.3 mg/dL (ref 0.3–1.2)
BUN: 13 mg/dL (ref 8–23)
CO2: 26 mmol/L (ref 22–32)
Calcium: 8.5 mg/dL — ABNORMAL LOW (ref 8.9–10.3)
Chloride: 100 mmol/L (ref 98–111)
Creatinine: 0.66 mg/dL (ref 0.44–1.00)
GFR, Est AFR Am: >60 mL/min (ref >60)
GFR, Estimated: >60 mL/min (ref >60)
Glucose, Bld: 83 mg/dL (ref 70–99)
POTASSIUM: 4.6 mmol/L (ref 3.5–5.1)
Sodium: 133 mmol/L — ABNORMAL LOW (ref 135–145)
Total Protein: 5.6 g/dL — ABNORMAL LOW (ref 6.5–8.1)

## 2018-03-06 LAB — PROTIME-INR
INR: 1.95
Prothrombin Time: 22 seconds — ABNORMAL HIGH (ref 11.4–15.2)

## 2018-03-06 MED ORDER — DIPHENHYDRAMINE HCL 50 MG/ML IJ SOLN
INTRAMUSCULAR | Status: AC
  Filled 2018-03-06: qty 1

## 2018-03-06 MED ORDER — SODIUM CHLORIDE 0.9% FLUSH
10.0000 mL | INTRAVENOUS | Status: DC | PRN
  Administered 2018-03-06: 10 mL
  Filled 2018-03-06: qty 10

## 2018-03-06 MED ORDER — FAMOTIDINE IN NACL 20-0.9 MG/50ML-% IV SOLN
INTRAVENOUS | Status: AC
  Filled 2018-03-06: qty 50

## 2018-03-06 MED ORDER — SODIUM CHLORIDE 0.9 % IV SOLN
209.0000 mg | Freq: Once | INTRAVENOUS | Status: AC
  Administered 2018-03-06: 210 mg via INTRAVENOUS
  Filled 2018-03-06: qty 21

## 2018-03-06 MED ORDER — SODIUM CHLORIDE 0.9 % IV SOLN
Freq: Once | INTRAVENOUS | Status: AC
  Administered 2018-03-06: 09:00:00 via INTRAVENOUS
  Filled 2018-03-06: qty 250

## 2018-03-06 MED ORDER — HEPARIN SOD (PORK) LOCK FLUSH 100 UNIT/ML IV SOLN
500.0000 [IU] | Freq: Once | INTRAVENOUS | Status: AC | PRN
  Administered 2018-03-06: 500 [IU]
  Filled 2018-03-06: qty 5

## 2018-03-06 MED ORDER — FAMOTIDINE IN NACL 20-0.9 MG/50ML-% IV SOLN
20.0000 mg | Freq: Once | INTRAVENOUS | Status: AC
  Administered 2018-03-06: 20 mg via INTRAVENOUS

## 2018-03-06 MED ORDER — PALONOSETRON HCL INJECTION 0.25 MG/5ML
0.2500 mg | Freq: Once | INTRAVENOUS | Status: AC
  Administered 2018-03-06: 0.25 mg via INTRAVENOUS

## 2018-03-06 MED ORDER — SONAFINE EX EMUL
1.0000 "application " | Freq: Once | CUTANEOUS | Status: AC
  Administered 2018-03-06: 1 via TOPICAL

## 2018-03-06 MED ORDER — PALONOSETRON HCL INJECTION 0.25 MG/5ML
INTRAVENOUS | Status: AC
  Filled 2018-03-06: qty 5

## 2018-03-06 MED ORDER — LIDOCAINE VISCOUS HCL 2 % MT SOLN: OROMUCOSAL | 5 refills | Status: DC

## 2018-03-06 MED ORDER — SODIUM CHLORIDE 0.9 % IV SOLN
20.0000 mg | Freq: Once | INTRAVENOUS | Status: AC
  Administered 2018-03-06: 20 mg via INTRAVENOUS
  Filled 2018-03-06: qty 2

## 2018-03-06 MED ORDER — SODIUM CHLORIDE 0.9% FLUSH
10.0000 mL | INTRAVENOUS | Status: DC | PRN
  Administered 2018-03-06: 10 mL via INTRAVENOUS
  Filled 2018-03-06: qty 10

## 2018-03-06 MED ORDER — SUCRALFATE 1 G PO TABS: ORAL_TABLET | ORAL | 5 refills | Status: DC

## 2018-03-06 MED ORDER — SODIUM CHLORIDE 0.9 % IV SOLN
50.0000 mg/m2 | Freq: Once | INTRAVENOUS | Status: AC
  Administered 2018-03-06: 102 mg via INTRAVENOUS
  Filled 2018-03-06: qty 17

## 2018-03-06 MED ORDER — DIPHENHYDRAMINE HCL 50 MG/ML IJ SOLN
50.0000 mg | Freq: Once | INTRAMUSCULAR | Status: AC
  Administered 2018-03-06: 50 mg via INTRAVENOUS

## 2018-03-06 NOTE — Patient Instructions (Signed)
[facility] Discharge Instructions for Patients Receiving Chemotherapy  Today you received the following chemotherapy agents: Paclitaxel (Taxol) and Carboplatin (Paraplatin)  To help prevent nausea and vomiting after your treatment, we encourage you to take your nausea medication as directed. Received Aloxi during treatment today-->Take Compazine (not Zofran) for the next 3 days as needed.    If you develop nausea and vomiting that is not controlled by your nausea medication, call the clinic.   BELOW ARE SYMPTOMS THAT SHOULD BE REPORTED IMMEDIATELY:  *FEVER GREATER THAN 100.5 F  *CHILLS WITH OR WITHOUT FEVER  NAUSEA AND VOMITING THAT IS NOT CONTROLLED WITH YOUR NAUSEA MEDICATION  *UNUSUAL SHORTNESS OF BREATH  *UNUSUAL BRUISING OR BLEEDING  TENDERNESS IN MOUTH AND THROAT WITH OR WITHOUT PRESENCE OF ULCERS  *URINARY PROBLEMS  *BOWEL PROBLEMS  UNUSUAL RASH Items with * indicate a potential emergency and should be followed up as soon as possible.  Feel free to call the clinic should you have any questions or concerns. The clinic phone number is [phone].  Please show the CHEMO ALERT CARD at check-in to the Emergency Department and triage nurse.   

## 2018-03-06 NOTE — Progress Notes (Signed)
PTT 22.0 and INR 1.95, per Dr. Burr Medico no need for Lovenox injection today. Pt instructed to continue taking Coumadin 7.5 mg daily. Pt and husband verbalized understanding and agreement. Copy of labs provided.

## 2018-03-06 NOTE — Patient Instructions (Signed)

## 2018-03-06 NOTE — Telephone Encounter (Signed)
No los per 12/23.

## 2018-03-06 NOTE — Progress Notes (Signed)
Pt here for patient teaching.  Pt given Radiation and You booklet, skin care instructions and Sonafine.  Reviewed areas of pertinence such as fatigue, skin changes and throat changes . Pt able to give teach back of to pat skin, use unscented/gentle soap and drink plenty of water,apply Sonafine bid and avoid applying anything to skin within 4 hours of treatment. Pt verbalizes understanding of information given and will contact nursing with any questions or concerns.     [email or web address]/treatmentinformation/whattoexpect/index      

## 2018-03-07 ENCOUNTER — Ambulatory Visit
Admission: RE | Admit: 2018-03-07 | Discharge: 2018-03-07 | Disposition: A | Discharge status: Home / Self Care | Payer: Medicare Other | Source: Ambulatory Visit | Attending: Radiation Oncology | Admitting: Radiation Oncology

## 2018-03-07 DIAGNOSIS — Z510 Encounter for antineoplastic radiation therapy: Secondary | ICD-10-CM | POA: Diagnosis not present

## 2018-03-07 DIAGNOSIS — C7931 Secondary malignant neoplasm of brain: Secondary | ICD-10-CM | POA: Diagnosis not present

## 2018-03-07 DIAGNOSIS — Z853 Personal history of malignant neoplasm of breast: Secondary | ICD-10-CM | POA: Diagnosis not present

## 2018-03-07 DIAGNOSIS — C154 Malignant neoplasm of middle third of esophagus: Secondary | ICD-10-CM | POA: Diagnosis not present

## 2018-03-07 DIAGNOSIS — C159 Malignant neoplasm of esophagus, unspecified: Secondary | ICD-10-CM | POA: Diagnosis not present

## 2018-03-09 ENCOUNTER — Inpatient Hospital Stay: Payer: Medicare Other

## 2018-03-09 ENCOUNTER — Ambulatory Visit
Admission: RE | Admit: 2018-03-09 | Discharge: 2018-03-09 | Disposition: A | Discharge status: Home / Self Care | Payer: Medicare Other | Source: Ambulatory Visit | Attending: Radiation Oncology | Admitting: Radiation Oncology

## 2018-03-09 DIAGNOSIS — Z5111 Encounter for antineoplastic chemotherapy: Secondary | ICD-10-CM | POA: Diagnosis not present

## 2018-03-09 DIAGNOSIS — C159 Malignant neoplasm of esophagus, unspecified: Secondary | ICD-10-CM

## 2018-03-09 DIAGNOSIS — C154 Malignant neoplasm of middle third of esophagus: Secondary | ICD-10-CM | POA: Diagnosis not present

## 2018-03-09 DIAGNOSIS — G629 Polyneuropathy, unspecified: Secondary | ICD-10-CM | POA: Diagnosis not present

## 2018-03-09 DIAGNOSIS — Z510 Encounter for antineoplastic radiation therapy: Secondary | ICD-10-CM | POA: Diagnosis not present

## 2018-03-09 DIAGNOSIS — Z170 Estrogen receptor positive status [ER+]: Secondary | ICD-10-CM | POA: Diagnosis not present

## 2018-03-09 DIAGNOSIS — C50911 Malignant neoplasm of unspecified site of right female breast: Secondary | ICD-10-CM | POA: Diagnosis not present

## 2018-03-09 DIAGNOSIS — C7931 Secondary malignant neoplasm of brain: Secondary | ICD-10-CM | POA: Diagnosis not present

## 2018-03-09 DIAGNOSIS — Z853 Personal history of malignant neoplasm of breast: Secondary | ICD-10-CM | POA: Diagnosis not present

## 2018-03-09 LAB — CBC WITH DIFFERENTIAL (CANCER CENTER ONLY)
Abs Immature Granulocytes: 0.05 10*3/uL (ref 0.00–0.07)
BASOS PCT: 0 %
Basophils Absolute: 0 10*3/uL (ref 0.0–0.1)
Eosinophils Absolute: 0 10*3/uL (ref 0.0–0.5)
Eosinophils Relative: 1 %
HCT: 35.6 % — ABNORMAL LOW (ref 36.0–46.0)
Hemoglobin: 11.7 g/dL — ABNORMAL LOW (ref 12.0–15.0)
Immature Granulocytes: 3 %
Lymphocytes Relative: 13 %
Lymphs Abs: 0.3 10*3/uL — ABNORMAL LOW (ref 0.7–4.0)
MCH: 30.7 pg (ref 26.0–34.0)
MCHC: 32.9 g/dL (ref 30.0–36.0)
MCV: 93.4 fL (ref 80.0–100.0)
Monocytes Absolute: 0.2 10*3/uL (ref 0.1–1.0)
Monocytes Relative: 8 %
NRBC: 2 % — AB (ref 0.0–0.2)
Neutro Abs: 1.5 10*3/uL — ABNORMAL LOW (ref 1.7–7.7)
Neutrophils Relative %: 75 %
Platelet Count: 138 10*3/uL — ABNORMAL LOW (ref 150–400)
RBC: 3.81 MIL/uL — ABNORMAL LOW (ref 3.87–5.11)
RDW: 14.9 % (ref 11.5–15.5)
WBC Count: 2 10*3/uL — ABNORMAL LOW (ref 4.0–10.5)

## 2018-03-09 LAB — CMP (CANCER CENTER ONLY)
ALT: 50 U/L — ABNORMAL HIGH (ref 0–44)
AST: 15 U/L (ref 15–41)
Albumin: 2.7 g/dL — ABNORMAL LOW (ref 3.5–5.0)
Alkaline Phosphatase: 80 U/L (ref 38–126)
Anion gap: 7 (ref 5–15)
BILIRUBIN TOTAL: 0.5 mg/dL (ref 0.3–1.2)
BUN: 16 mg/dL (ref 8–23)
CO2: 27 mmol/L (ref 22–32)
Calcium: 8.2 mg/dL — ABNORMAL LOW (ref 8.9–10.3)
Chloride: 101 mmol/L (ref 98–111)
Creatinine: 0.69 mg/dL (ref 0.44–1.00)
Glucose, Bld: 106 mg/dL — ABNORMAL HIGH (ref 70–99)
Potassium: 4.1 mmol/L (ref 3.5–5.1)
Sodium: 135 mmol/L (ref 135–145)
Total Protein: 5.6 g/dL — ABNORMAL LOW (ref 6.5–8.1)

## 2018-03-10 ENCOUNTER — Ambulatory Visit
Admission: RE | Admit: 2018-03-10 | Discharge: 2018-03-10 | Disposition: A | Discharge status: Home / Self Care | Payer: Medicare Other | Source: Ambulatory Visit | Attending: Radiation Oncology | Admitting: Radiation Oncology

## 2018-03-10 ENCOUNTER — Other Ambulatory Visit: Payer: Self-pay | Admitting: Medical

## 2018-03-10 ENCOUNTER — Telehealth: Payer: Self-pay | Admitting: Medical

## 2018-03-10 ENCOUNTER — Ambulatory Visit: Payer: Medicare Other | Admitting: Radiation Oncology

## 2018-03-10 ENCOUNTER — Inpatient Hospital Stay: Payer: Medicare Other

## 2018-03-10 DIAGNOSIS — Z853 Personal history of malignant neoplasm of breast: Secondary | ICD-10-CM | POA: Diagnosis not present

## 2018-03-10 DIAGNOSIS — I824Z2 Acute embolism and thrombosis of unspecified deep veins of left distal lower extremity: Secondary | ICD-10-CM

## 2018-03-10 DIAGNOSIS — C159 Malignant neoplasm of esophagus, unspecified: Secondary | ICD-10-CM | POA: Diagnosis not present

## 2018-03-10 DIAGNOSIS — C50911 Malignant neoplasm of unspecified site of right female breast: Secondary | ICD-10-CM | POA: Diagnosis not present

## 2018-03-10 DIAGNOSIS — G629 Polyneuropathy, unspecified: Secondary | ICD-10-CM | POA: Diagnosis not present

## 2018-03-10 DIAGNOSIS — C154 Malignant neoplasm of middle third of esophagus: Secondary | ICD-10-CM | POA: Diagnosis not present

## 2018-03-10 DIAGNOSIS — Z5111 Encounter for antineoplastic chemotherapy: Secondary | ICD-10-CM | POA: Diagnosis not present

## 2018-03-10 DIAGNOSIS — Z170 Estrogen receptor positive status [ER+]: Secondary | ICD-10-CM | POA: Diagnosis not present

## 2018-03-10 DIAGNOSIS — Z510 Encounter for antineoplastic radiation therapy: Secondary | ICD-10-CM | POA: Diagnosis not present

## 2018-03-10 DIAGNOSIS — C7931 Secondary malignant neoplasm of brain: Secondary | ICD-10-CM | POA: Diagnosis not present

## 2018-03-10 LAB — PROTIME-INR
INR: 3.96
Prothrombin Time: 38 seconds — ABNORMAL HIGH (ref 11.4–15.2)

## 2018-03-10 NOTE — Telephone Encounter (Signed)
INR returned at 3.96 today.  The patient was told to hold her Coumadin tonight and restart it tomorrow at 5 mg for 5 nights.  An INR will be rechecked on Thursday.  This was discussed with Dr. Burr Medico.

## 2018-03-10 NOTE — Progress Notes (Signed)
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    01/30/2018 Initial Diagnosis    Metastasis to brain [facility])    01/31/2018 Imaging    MRI Brain W WO Contrast 01/31/18  IMPRESSION: 3 Tesla study does not disclose any additional lesions. Solitary metastasis in the right superior cerebellum measuring 2.9 x 2.6 x 3.0 cm with mild surrounding edema. 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    02/03/2018 - 02/08/2018 Radiation Therapy    SBRT with Dr. Isidore Moos on 02/03/18, 02/06/18, 02/08/18     Esophageal cancer, stage IV ([place])   01/08/2018 Imaging    CT AP W Constrast 01/08/18  IMPRESSION: 1. Nonobstructed, nondistended bowel. No acute inflammatory process. 2. Moderate-sized hiatal hernia noted. 3. Low-density 1.7 cm left adrenal nodule likely to represent a small adenoma. Lower pole left renal cyst measuring 1.8 cm. 4. Moderate aortic atherosclerosis. 5. Lumbar spondylosis and facet arthropathy.    01/27/2018 Imaging    CT Chest W Contrast 01/27/18  IMPRESSION: 1. Large mass involving the distal half of the esophagus spanning approximately 11.7 cm is identified compatible with primary esophageal  neoplasm. 2. Small posterior mediastinal and high right paratracheal lymph nodes are noted which may represent foci of metastatic adenopathy. 3. The small pulmonary nodules in the right lung are unchanged from 01/15/2016 and are favored to represent a benign process. 4. Aortic Atherosclerosis (ICD10-I70.0) and Emphysema (ICD10-J43.9). 5. Coronary artery atherosclerotic calcifications.    01/30/2018 Initial Diagnosis    Esophageal cancer, stage IV ([place])    02/03/2018 - 02/08/2018 Radiation Therapy    SBRT to oligo brain metastasis, in 3 fractions, under Dr. Isidore Moos     02/16/2018 PET scan    PET 02/16/18 IMPRESSION: 1. A 15.7 cm in length esophageal mass extends from the aortic arch level down to the distal esophagus, maximum SUV 22.4, compatible with malignancy. There is a small right lower neck level IV lymph node with lower than mediastinal blood pool activity, and a right upper paratracheal lymph node with just above mediastinal blood pool activity. 2. Faint speckled heterogeneity in the liver, for example in the lateral segment left hepatic lobe, is likely incidental/benign given the lack of correlate of CT finding. It might be prudent to obtain an MRI just to make sure that there is not a small early metastatic lesion in this vicinity. 3. Known right cerebellar vermian metastatic lesion, with expected hypo activity compared to the surrounding brain activity. 4. The right middle lobe pulmonary nodule measures 1.1 by 1.0 cm and is mildly hypermetabolic with maximum SUV 2.6. This lesion measured 6 by 5 mm back on 12/06/2003 and 1.2 by 1.1 cm on 10/15/2015. I am uncertain what to make of the very slow growth and  mildly accentuated metabolic activity. This could be a chronic granulomatous process. Low-grade adenocarcinoma of the lung seems unlikely to be so indolent as to only mildly increase over a 14 year period, but is presumably a differential diagnostic  consideration. Surveillance is suggested. 5. Other imaging findings of potential clinical significance: Aortic Atherosclerosis (ICD10-I70.0). Descending and sigmoid colon diverticulosis. Gas in the urinary bladder, most common cause would be recent catheterization. Minimal chronic left ethmoid sinusitis.     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 Ultrasound-guided biopsy is recommended.    01/10/2018 Initial Biopsy    Diagnsis from Breast Biopsy 01/10/18  Breast, right, needle core biopsy, 6 o'clock - INVASIVE DUCTAL CARCINOMA WITH CALCIFICATIONS, SEE COMMENT. - DUCTAL CARCINOMA IN SITU. 1 of 2 FINAL for Kristin Williams, Kristin Williams [phone]) Microscopic Comment The carcinoma appears grade 2. Prognostic markers will be ordered. Dr. [name] has reviewed the case. The case was called to [facility] on 01/11/2018.    01/10/2018 Receptors her2    The tumor cells are NEGATIVE for Her2 (1+). Estrogen Receptor: 100%, POSITIVE, STRONG STAINING INTENSITY Progesterone Receptor: 100%, POSITIVE, STRONG STAINING INTENSITY Proliferation Marker Ki67: 15%    01/10/2018 Cancer Staging    Staging form: Breast, AJCC 8th Edition - Clinical stage from 01/10/2018: Stage IA (cT1b, cN0, cM0, G2, ER+, PR+, HER2-) - Signed by [name], MD on 02/03/2018    01/30/2018 Initial Diagnosis    Breast cancer ([place])      CURRENT THERAPY:  weekly carboplatin and taxol starting on 02/27/2018  INTERVAL HISTORY:  Kristin Williams is here for a follow up and CT. She presents to the infusion room today. She notes she is uses Lidocaine and  Sulcrafate before she eats to help her eat. She is on liquid and soft food diet. She denies nausea. She notes swelling in b/l hands and legs. She notes coughing more. She denies aspiration with eating. She notes her ambulation has improved and still ambulates with walker. She notes her speech is still slurred but the weakness in her right LE has improved.  She has mucus in her stool with occasion bleeding from IBS which is common for her. The blood is only present when she wipes.  Her daughter feels she is starting to retain water. She dnies mouth soreness.  She notes she has a loose tooth and woul dlike to get this removed by dentist if needed.     REVIEW OF SYSTEMS:   Constitutional: Denies fevers, chills or abnormal weight loss Eyes: Denies blurriness of vision Ears, nose, mouth, throat, and face: Denies mucositis or sore throat Respiratory: Denies dyspnea or wheezes (+) cough  Cardiovascular: Denies palpitation, chest discomfort (+) swelling of b/l hands and b/l lower extremity Gastrointestinal:  Denies nausea, heartburn or change in bowel habits (+) blood in stool occasaionlly  Skin: Denies abnormal skin rashes Lymphatics: Denies new lymphadenopathy or easy bruising Neurological:Denies numbness, tingling or new weaknesses Behavioral/Psych: Mood is stable, no new changes  All other systems were reviewed with the patient and are negative.  MEDICAL HISTORY:  Past Medical History:  Diagnosis Date  . Arthritis   .  Cancer (HCC)    Cervical  . Cancer ([place])    Breast  . Cancer ([place])    Vaginal  . Complication of anesthesia   . COPD (chronic obstructive pulmonary disease) ([place])   . Headache(784.0)   . Hyperlipidemia   . Leg pain   . Peripheral vascular disease ([place])   . Personal history of chemotherapy   . Personal history of radiation therapy   . PONV (postoperative nausea and vomiting)     SURGICAL HISTORY: Past Surgical History:  Procedure Laterality Date  . ABDOMINAL  HYSTERECTOMY  1975  . BREAST LUMPECTOMY  2005  . GANGLION CYST EXCISION  1977  . ILIAC ARTERY STENT  04/20/2004   CSD right iliac occlusive disease  . IR IMAGING GUIDED PORT INSERTION  02/22/2018    I have reviewed the social history and family history with the patient and they are unchanged from previous note.  ALLERGIES:  is allergic to flonase [fluticasone propionate].  MEDICATIONS:  Current Outpatient Medications  Medication Sig Dispense Refill  . acetaminophen (TYLENOL) 160 MG/5ML liquid Take 325 mg by mouth every 4 (four) hours as needed for fever.    Marland Kitchen dexamethasone (DECADRON) 4 MG tablet Take 1 tablet (4 mg total) by mouth daily. Take 4 mg daily for 2 to 3 days after chemotherapy treatment 30 tablet 1  . enoxaparin (LOVENOX) 150 MG/ML injection Inject 0.93 mLs (140 mg total) into the skin daily for 2 days. 12/21 and 12/22 2 Syringe 0  . lidocaine (XYLOCAINE) 2 % solution Patient: Mix 1part 2% viscous lidocaine, 1part H20. Swallow 51m of diluted mixture, 249m before meals and at bedtime, up to QID 100 mL 5  . lidocaine-prilocaine (EMLA) cream Apply 1 application topically as needed. 30 g 1  . LORazepam (ATIVAN) 1 MG tablet Take 1 tablet (1 mg total) by mouth 3 (three) times daily as needed for anxiety. 15 tablet 0  . metoCLOPramide (REGLAN) 10 MG tablet Take 1 tablet (10 mg total) by mouth every 6 (six) hours as needed for nausea. 60 tablet 2  . omeprazole (PRILOSEC) 20 MG capsule Take 40 mg by mouth daily.     . ondansetron (ZOFRAN) 8 MG tablet Take 1 tablet (8 mg total) by mouth every 8 (eight) hours as needed for nausea or vomiting. 20 tablet 0  . prochlorperazine (COMPAZINE) 10 MG tablet Take 1 tablet (10 mg total) by mouth every 6 (six) hours as needed for nausea or vomiting. 30 tablet 0  . sucralfate (CARAFATE) 1 g tablet Dissolve 1 tablet in 10 mL H20 and swallow up to QID to soothe throat. 40 tablet 5  . warfarin (COUMADIN) 2.5 MG tablet Take 2 tablets once daily with  supper and then as directed by MD 90 tablet 2   No current facility-administered medications for this visit.     PHYSICAL EXAMINATION: ECOG PERFORMANCE STATUS: 2 - Symptomatic, <50% confined to bed Blood pressure 107/64, heart rate 110, respiratory rate 16, temperature 37.2, pulse ox 99% on room air GENERAL:alert, no distress and comfortable SKIN: skin color, texture, turgor are normal, no rashes or significant lesions EYES: normal, Conjunctiva are pink and non-injected, sclera clear OROPHARYNX:no exudate, no erythema and lips, buccal mucosa, and tongue normal  NECK: supple, thyroid normal size, non-tender, without nodularity LYMPH:  no palpable lymphadenopathy in the cervical, axillary or inguinal LUNGS: clear to auscultation and percussion with normal breathing effort HEART: regular rate & rhythm and no murmurs and no lower extremity edema ABDOMEN:abdomen soft, non-tender  and normal bowel sounds Musculoskeletal:no cyanosis of digits and no clubbing  NEURO: alert & oriented x 3 with fluent speech, no focal motor/sensory deficits  LABORATORY DATA:  I have reviewed the data as listed CBC Latest Ref Rng & Units 03/13/2018 03/09/2018 03/06/2018  WBC 4.0 - 10.5 K/uL 2.8(L) 2.0(L) 3.4(L)  Hemoglobin 12.0 - 15.0 g/dL 10.8(L) 11.7(L) 11.8(L)  Hematocrit 36.0 - 46.0 % 33.2(L) 35.6(L) 35.9(L)  Platelets 150 - 400 K/uL 150 138(L) 152     CMP Latest Ref Rng & Units 03/13/2018 03/09/2018 03/06/2018  Glucose 70 - 99 mg/dL 115(H) 106(H) 83  BUN 8 - 23 mg/dL '13 16 13  ' Creatinine 0.44 - 1.00 mg/dL 0.67 0.69 0.66  Sodium 135 - 145 mmol/L 138 135 133(L)  Potassium 3.5 - 5.1 mmol/L 4.0 4.1 4.6  Chloride 98 - 111 mmol/L 105 101 100  CO2 22 - 32 mmol/L '25 27 26  ' Calcium 8.9 - 10.3 mg/dL 8.7(L) 8.2(L) 8.5(L)  Total Protein 6.5 - 8.1 g/dL 5.5(L) 5.6(L) 5.6(L)  Total Bilirubin 0.3 - 1.2 mg/dL 0.5 0.5 0.3  Alkaline Phos 38 - 126 U/L 91 80 89  AST 15 - 41 U/L 12(L) 15 21  ALT 0 - 44 U/L 26 50(H)  66(H)      RADIOGRAPHIC STUDIES: I have personally reviewed the radiological images as listed and agreed with the findings in the report. No results found.   ASSESSMENT & PLAN:  XIANNA SIVERLING is a 72 y.o. female with   1. EsophagealAdenocarcinoma,with oligo met incerebellum, stage IV  -She was recently diagnosed with esophogeal cancer, with oligo metastasis to the brain. Given her stage IV disease her cancer is not curable but still treatable. Goal of care it to control her disease.   -She completed SBRT to her brain met on 02/08/2018.  -I personally reviewed her PET from 02/16/18 and discussed with pt and her family, it showed lung nodule she had since 2017 and has been getting smaller, likely benign. She has a benign cysts in her kidney. There is a mildly hypermetabolic right supraclavicular LN. No other evidence of distant metastasis. Her primary esophageal mass is very long and hot on PET, along with a hypermetabolic right upper paratracheal node.  -She started concurrent chemo RT on 02/27/2018 with weekly carboplatin and taxol for better local disease control.  -if she can tolerate, I will consider FOLFOX 3 months as consolidation after chemoRT  -She is tolerating treatment moderately well with cough, b/l extremity edema and epistaxis. I suggets she uses nasal saline and I will presrcibe her Hycodan for her cough.  -Labs reviewed, Hg at 10.8, WBC at 2.8, Albumin at 2.3, overall adequate to proceed with ChemoRT.  -f/u in 1 week   2. RightBreastInvasive Ductal Carcinoma,cT1bN0M0 stage IA,Grade II, ER/PR Positive, HER 2 negative -Diagnosed through screening mammogram, same time as her esophageal cancer in October 2019.  -Given her early stage breast cancer, I previously discussedher breast cancer treatment is less urgent than esophogeal cancertreatment. -I plan tostart her onanti-estrogen therapyafter she completes chemoradiation for esophageal cancer, given her ER/PR  positive cancer.If shedoes very well in 6 to 12 monthswithoutesophageal cancer without progression,we mayconsider breast surgery.   3.Oligo brain mets -This is likely from her primaryesophogealcancer.  -She does have balance issues and ambulates with assistance. She knows she is at risk for fall and that she should be careful. -She is s/p SBRT. Has mild slurred speech, right sided weakness and balance stability continues to improve -Will monitor  with brain scan every 3 months.  4. H/o Left breast cancer, ER/PR negative, treated by Dr. Jana Hakim with lumpectomy, adj, chemo and radiation. -She was previously referred to genetics  -no evidence of recurrence    5. Dysphagia, nausea, low appetite  -secondary to her esophageal cancer and chemoradiation.   -She does not like ensure or boost. She will try carnation breakfast and juicing her food. -Continue Lidocaine and Sulcrafate before meals to help her eat.  -She does not tolerate Compazine. She has Zofran but Reglan gives her the best relief. Nausea is currently controlledand stable.  -She is on taper dose Dexa, currently at 46m daily. Plans to be off 03/10/18.   -OK to use dexa 468mdaily for 3-5 days after chemo  -I think she did not need a feeding tube for now, we previously discussed her dysphagia and odynophagia make it worse during chemo and radiation, and the possibility she may needs a feeding tube. -Stable.   6. Acute deep vein thrombosis (DVT) of popliteal vein of left lower extremity, diagnosed on 02/28/2018  -She was found to have left lower extremity edema on his first day of chemoradiation, Doppler showed DVT -Due to the risk of GI bleeding and intracranial hemorrhage from brain metastasis, will start her on Coumadin with Lovenox bridging, dose adjust as needed.  -Her INR was 3.96 on March 10, 2018, and we changed her Coumadin from 7.5 mg daily to 5 mg daily, will repeat INR later this week -She has mild  blood in stool from IBS. I encouraged her to use stool softerners to avoid constipation and iritation as she is on blood thinners.  -She has loose tooth and would like to get this removed by dentist if she has to. I instructed her to tell her dentist she is on Coumadin before proceeding.    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   8.Goal of care discussion  -she is full code now  9. COPD, Cough -She uses cough drops, I will call in Hycodan to help control -no fever or sputum production, no need antibiotics for now    PLAN: -I prescribed her Hycodan today -Lab reviewed, adequate for treatment, continue ChemoRT -Patient to follow up with labs and chemo treatment on next week.  -repeat PT/INR later this week,and adjust coumadin dose as needed     No problem-specific Assessment & Plan notes found for this encounter.   No orders of the defined types were placed in this encounter.  All questions were answered. The patient knows to call the clinic with any problems, questions or concerns. No barriers to learning was detected. I spent 20 minutes counseling the patient face to face. The total time spent in the appointment was 25 minutes and more than 50% was on counseling and review of test results     YaTruitt MerleMD 03/13/2018   I, AmJoslyn Devonam acting as scribe for YaTruitt MerleMD.   I have reviewed the above documentation for accuracy and completeness, and I agree with the above.

## 2018-03-13 ENCOUNTER — Inpatient Hospital Stay: Payer: Medicare Other

## 2018-03-13 ENCOUNTER — Inpatient Hospital Stay (HOSPITAL_BASED_OUTPATIENT_CLINIC_OR_DEPARTMENT_OTHER): Payer: Medicare Other | Admitting: Hematology

## 2018-03-13 ENCOUNTER — Telehealth: Payer: Self-pay | Admitting: Hematology

## 2018-03-13 ENCOUNTER — Ambulatory Visit
Admission: RE | Admit: 2018-03-13 | Discharge: 2018-03-13 | Disposition: A | Discharge status: Home / Self Care | Payer: Medicare Other | Source: Ambulatory Visit | Attending: Radiation Oncology | Admitting: Radiation Oncology

## 2018-03-13 VITALS — BP 107/64 | HR 110 | Temp 98.9°F | Resp 16 | Ht 60.0 in | Wt 211.8 lb

## 2018-03-13 DIAGNOSIS — C154 Malignant neoplasm of middle third of esophagus: Secondary | ICD-10-CM | POA: Diagnosis not present

## 2018-03-13 DIAGNOSIS — J449 Chronic obstructive pulmonary disease, unspecified: Secondary | ICD-10-CM | POA: Diagnosis not present

## 2018-03-13 DIAGNOSIS — C159 Malignant neoplasm of esophagus, unspecified: Secondary | ICD-10-CM

## 2018-03-13 DIAGNOSIS — K589 Irritable bowel syndrome without diarrhea: Secondary | ICD-10-CM

## 2018-03-13 DIAGNOSIS — G629 Polyneuropathy, unspecified: Secondary | ICD-10-CM

## 2018-03-13 DIAGNOSIS — I82432 Acute embolism and thrombosis of left popliteal vein: Secondary | ICD-10-CM | POA: Diagnosis not present

## 2018-03-13 DIAGNOSIS — Z7189 Other specified counseling: Secondary | ICD-10-CM | POA: Diagnosis not present

## 2018-03-13 DIAGNOSIS — C50911 Malignant neoplasm of unspecified site of right female breast: Secondary | ICD-10-CM | POA: Diagnosis not present

## 2018-03-13 DIAGNOSIS — R040 Epistaxis: Secondary | ICD-10-CM

## 2018-03-13 DIAGNOSIS — R600 Localized edema: Secondary | ICD-10-CM | POA: Diagnosis not present

## 2018-03-13 DIAGNOSIS — Z5111 Encounter for antineoplastic chemotherapy: Secondary | ICD-10-CM | POA: Diagnosis not present

## 2018-03-13 DIAGNOSIS — Z853 Personal history of malignant neoplasm of breast: Secondary | ICD-10-CM | POA: Diagnosis not present

## 2018-03-13 DIAGNOSIS — I10 Essential (primary) hypertension: Secondary | ICD-10-CM

## 2018-03-13 DIAGNOSIS — Z170 Estrogen receptor positive status [ER+]: Secondary | ICD-10-CM

## 2018-03-13 DIAGNOSIS — R05 Cough: Secondary | ICD-10-CM | POA: Diagnosis not present

## 2018-03-13 DIAGNOSIS — C7931 Secondary malignant neoplasm of brain: Secondary | ICD-10-CM | POA: Diagnosis not present

## 2018-03-13 DIAGNOSIS — Z510 Encounter for antineoplastic radiation therapy: Secondary | ICD-10-CM | POA: Diagnosis not present

## 2018-03-13 DIAGNOSIS — C50111 Malignant neoplasm of central portion of right female breast: Secondary | ICD-10-CM

## 2018-03-13 LAB — CBC WITH DIFFERENTIAL (CANCER CENTER ONLY)
ABS IMMATURE GRANULOCYTES: 0.08 10*3/uL — AB (ref 0.00–0.07)
Basophils Absolute: 0 10*3/uL (ref 0.0–0.1)
Basophils Relative: 1 %
Eosinophils Absolute: 0 10*3/uL (ref 0.0–0.5)
Eosinophils Relative: 0 %
HCT: 33.2 % — ABNORMAL LOW (ref 36.0–46.0)
Hemoglobin: 10.8 g/dL — ABNORMAL LOW (ref 12.0–15.0)
Immature Granulocytes: 3 %
Lymphocytes Relative: 7 %
Lymphs Abs: 0.2 10*3/uL — ABNORMAL LOW (ref 0.7–4.0)
MCH: 30.9 pg (ref 26.0–34.0)
MCHC: 32.5 g/dL (ref 30.0–36.0)
MCV: 94.9 fL (ref 80.0–100.0)
MONOS PCT: 10 %
Monocytes Absolute: 0.3 10*3/uL (ref 0.1–1.0)
Neutro Abs: 2.2 10*3/uL (ref 1.7–7.7)
Neutrophils Relative %: 79 %
Platelet Count: 150 10*3/uL (ref 150–400)
RBC: 3.5 MIL/uL — ABNORMAL LOW (ref 3.87–5.11)
RDW: 14.9 % (ref 11.5–15.5)
WBC Count: 2.8 10*3/uL — ABNORMAL LOW (ref 4.0–10.5)
nRBC: 0.7 % — ABNORMAL HIGH (ref 0.0–0.2)

## 2018-03-13 LAB — CMP (CANCER CENTER ONLY)
ALBUMIN: 2.3 g/dL — AB (ref 3.5–5.0)
ALT: 26 U/L (ref 0–44)
AST: 12 U/L — ABNORMAL LOW (ref 15–41)
Alkaline Phosphatase: 91 U/L (ref 38–126)
Anion gap: 8 (ref 5–15)
BUN: 13 mg/dL (ref 8–23)
CHLORIDE: 105 mmol/L (ref 98–111)
CO2: 25 mmol/L (ref 22–32)
Calcium: 8.7 mg/dL — ABNORMAL LOW (ref 8.9–10.3)
Creatinine: 0.67 mg/dL (ref 0.44–1.00)
GFR, Est AFR Am: >60 mL/min (ref >60)
GFR, Estimated: >60 mL/min (ref >60)
GLUCOSE: 115 mg/dL — AB (ref 70–99)
Potassium: 4 mmol/L (ref 3.5–5.1)
Sodium: 138 mmol/L (ref 135–145)
Total Bilirubin: 0.5 mg/dL (ref 0.3–1.2)
Total Protein: 5.5 g/dL — ABNORMAL LOW (ref 6.5–8.1)

## 2018-03-13 MED ORDER — DEXAMETHASONE SODIUM PHOSPHATE 100 MG/10ML IJ SOLN
20.0000 mg | Freq: Once | INTRAMUSCULAR | Status: AC
Start: 2018-03-13 — End: 2018-03-13
  Administered 2018-03-13: 20 mg via INTRAVENOUS
  Filled 2018-03-13: qty 2

## 2018-03-13 MED ORDER — SODIUM CHLORIDE 0.9 % IV SOLN
209.0000 mg | Freq: Once | INTRAVENOUS | Status: AC
  Administered 2018-03-13: 210 mg via INTRAVENOUS
  Filled 2018-03-13: qty 21

## 2018-03-13 MED ORDER — SODIUM CHLORIDE 0.9% FLUSH
10.0000 mL | INTRAVENOUS | Status: DC | PRN
  Administered 2018-03-13: 10 mL
  Filled 2018-03-13: qty 10

## 2018-03-13 MED ORDER — PALONOSETRON HCL INJECTION 0.25 MG/5ML
0.2500 mg | Freq: Once | INTRAVENOUS | Status: AC
  Administered 2018-03-13: 0.25 mg via INTRAVENOUS

## 2018-03-13 MED ORDER — FAMOTIDINE IN NACL 20-0.9 MG/50ML-% IV SOLN
20.0000 mg | Freq: Once | INTRAVENOUS | Status: AC
  Administered 2018-03-13: 20 mg via INTRAVENOUS

## 2018-03-13 MED ORDER — DIPHENHYDRAMINE HCL 50 MG/ML IJ SOLN
50.0000 mg | Freq: Once | INTRAMUSCULAR | Status: AC
  Administered 2018-03-13: 50 mg via INTRAVENOUS

## 2018-03-13 MED ORDER — SODIUM CHLORIDE 0.9 % IV SOLN
Freq: Once | INTRAVENOUS | Status: AC
  Administered 2018-03-13: 09:00:00 via INTRAVENOUS
  Filled 2018-03-13: qty 250

## 2018-03-13 MED ORDER — SODIUM CHLORIDE 0.9 % IV SOLN
50.0000 mg/m2 | Freq: Once | INTRAVENOUS | Status: AC
  Administered 2018-03-13: 102 mg via INTRAVENOUS
  Filled 2018-03-13: qty 17

## 2018-03-13 MED ORDER — HEPARIN SOD (PORK) LOCK FLUSH 100 UNIT/ML IV SOLN
500.0000 [IU] | Freq: Once | INTRAVENOUS | Status: AC | PRN
  Administered 2018-03-13: 500 [IU]
  Filled 2018-03-13: qty 5

## 2018-03-13 MED ORDER — HYDROCODONE-HOMATROPINE 5-1.5 MG/5ML PO SYRP: 5.0000 mL | ORAL_SOLUTION | Freq: Four times a day (QID) | ORAL | 0 refills | Status: DC | PRN

## 2018-03-13 NOTE — Patient Instructions (Signed)
[facility] Discharge Instructions for Patients Receiving Chemotherapy  Today you received the following chemotherapy agents: Paclitaxel (Taxol) and Carboplatin (Paraplatin)  To help prevent nausea and vomiting after your treatment, we encourage you to take your nausea medication as directed. Received Aloxi during treatment today-->Take Compazine (not Zofran) for the next 3 days as needed.    If you develop nausea and vomiting that is not controlled by your nausea medication, call the clinic.   BELOW ARE SYMPTOMS THAT SHOULD BE REPORTED IMMEDIATELY:  *FEVER GREATER THAN 100.5 F  *CHILLS WITH OR WITHOUT FEVER  NAUSEA AND VOMITING THAT IS NOT CONTROLLED WITH YOUR NAUSEA MEDICATION  *UNUSUAL SHORTNESS OF BREATH  *UNUSUAL BRUISING OR BLEEDING  TENDERNESS IN MOUTH AND THROAT WITH OR WITHOUT PRESENCE OF ULCERS  *URINARY PROBLEMS  *BOWEL PROBLEMS  UNUSUAL RASH Items with * indicate a potential emergency and should be followed up as soon as possible.  Feel free to call the clinic should you have any questions or concerns. The clinic phone number is [phone].  Please show the CHEMO ALERT CARD at check-in to the Emergency Department and triage nurse.   

## 2018-03-13 NOTE — Telephone Encounter (Signed)
Scheduled appt per sch message - lab appt after radiation on 1/2 - left message for patient with appt date and time

## 2018-03-13 NOTE — Telephone Encounter (Signed)
No los per 12/30. °

## 2018-03-14 ENCOUNTER — Ambulatory Visit
Admission: RE | Admit: 2018-03-14 | Discharge: 2018-03-14 | Disposition: A | Discharge status: Home / Self Care | Payer: Medicare Other | Source: Ambulatory Visit | Attending: Radiation Oncology | Admitting: Radiation Oncology

## 2018-03-14 ENCOUNTER — Encounter: Payer: Self-pay | Admitting: Hematology

## 2018-03-14 DIAGNOSIS — Z510 Encounter for antineoplastic radiation therapy: Secondary | ICD-10-CM | POA: Diagnosis not present

## 2018-03-14 DIAGNOSIS — Z853 Personal history of malignant neoplasm of breast: Secondary | ICD-10-CM | POA: Diagnosis not present

## 2018-03-14 DIAGNOSIS — C154 Malignant neoplasm of middle third of esophagus: Secondary | ICD-10-CM | POA: Diagnosis not present

## 2018-03-14 DIAGNOSIS — C7931 Secondary malignant neoplasm of brain: Secondary | ICD-10-CM | POA: Diagnosis not present

## 2018-03-14 DIAGNOSIS — C159 Malignant neoplasm of esophagus, unspecified: Secondary | ICD-10-CM | POA: Diagnosis not present

## 2018-03-16 ENCOUNTER — Telehealth: Payer: Self-pay | Admitting: Medical

## 2018-03-16 ENCOUNTER — Inpatient Hospital Stay: Payer: Medicare Other | Attending: Hematology

## 2018-03-16 ENCOUNTER — Other Ambulatory Visit: Payer: Self-pay | Admitting: Medical

## 2018-03-16 ENCOUNTER — Ambulatory Visit
Admission: RE | Admit: 2018-03-16 | Discharge: 2018-03-16 | Disposition: A | Discharge status: Home / Self Care | Payer: Medicare Other | Source: Ambulatory Visit | Attending: Radiation Oncology | Admitting: Radiation Oncology

## 2018-03-16 DIAGNOSIS — J209 Acute bronchitis, unspecified: Secondary | ICD-10-CM | POA: Diagnosis not present

## 2018-03-16 DIAGNOSIS — I82432 Acute embolism and thrombosis of left popliteal vein: Secondary | ICD-10-CM | POA: Insufficient documentation

## 2018-03-16 DIAGNOSIS — C159 Malignant neoplasm of esophagus, unspecified: Secondary | ICD-10-CM | POA: Insufficient documentation

## 2018-03-16 DIAGNOSIS — C7931 Secondary malignant neoplasm of brain: Secondary | ICD-10-CM | POA: Diagnosis not present

## 2018-03-16 DIAGNOSIS — Z79899 Other long term (current) drug therapy: Secondary | ICD-10-CM | POA: Diagnosis not present

## 2018-03-16 DIAGNOSIS — Z5111 Encounter for antineoplastic chemotherapy: Secondary | ICD-10-CM | POA: Diagnosis not present

## 2018-03-16 DIAGNOSIS — C50911 Malignant neoplasm of unspecified site of right female breast: Secondary | ICD-10-CM | POA: Insufficient documentation

## 2018-03-16 DIAGNOSIS — Z853 Personal history of malignant neoplasm of breast: Secondary | ICD-10-CM | POA: Insufficient documentation

## 2018-03-16 DIAGNOSIS — R110 Nausea: Secondary | ICD-10-CM | POA: Insufficient documentation

## 2018-03-16 DIAGNOSIS — Z510 Encounter for antineoplastic radiation therapy: Secondary | ICD-10-CM | POA: Diagnosis not present

## 2018-03-16 DIAGNOSIS — I824Z2 Acute embolism and thrombosis of unspecified deep veins of left distal lower extremity: Secondary | ICD-10-CM

## 2018-03-16 DIAGNOSIS — C154 Malignant neoplasm of middle third of esophagus: Secondary | ICD-10-CM | POA: Diagnosis not present

## 2018-03-16 DIAGNOSIS — Z7901 Long term (current) use of anticoagulants: Secondary | ICD-10-CM | POA: Diagnosis not present

## 2018-03-16 LAB — CBC WITH DIFFERENTIAL (CANCER CENTER ONLY)
Abs Immature Granulocytes: 0.04 10*3/uL (ref 0.00–0.07)
Basophils Absolute: 0 10*3/uL (ref 0.0–0.1)
Basophils Relative: 0 %
Eosinophils Absolute: 0 10*3/uL (ref 0.0–0.5)
Eosinophils Relative: 0 %
HCT: 32.4 % — ABNORMAL LOW (ref 36.0–46.0)
Hemoglobin: 10.7 g/dL — ABNORMAL LOW (ref 12.0–15.0)
Immature Granulocytes: 2 %
Lymphocytes Relative: 7 %
Lymphs Abs: 0.1 10*3/uL — ABNORMAL LOW (ref 0.7–4.0)
MCH: 30.9 pg (ref 26.0–34.0)
MCHC: 33 g/dL (ref 30.0–36.0)
MCV: 93.6 fL (ref 80.0–100.0)
Monocytes Absolute: 0.1 10*3/uL (ref 0.1–1.0)
Monocytes Relative: 6 %
NEUTROS PCT: 85 %
Neutro Abs: 1.7 10*3/uL (ref 1.7–7.7)
Platelet Count: 173 10*3/uL (ref 150–400)
RBC: 3.46 MIL/uL — ABNORMAL LOW (ref 3.87–5.11)
RDW: 14.7 % (ref 11.5–15.5)
WBC Count: 2 10*3/uL — ABNORMAL LOW (ref 4.0–10.5)
nRBC: 1.5 % — ABNORMAL HIGH (ref 0.0–0.2)

## 2018-03-16 LAB — CMP (CANCER CENTER ONLY)
ALBUMIN: 2.6 g/dL — AB (ref 3.5–5.0)
ALT: 27 U/L (ref 0–44)
ANION GAP: 7 (ref 5–15)
AST: 14 U/L — ABNORMAL LOW (ref 15–41)
Alkaline Phosphatase: 84 U/L (ref 38–126)
BUN: 13 mg/dL (ref 8–23)
CO2: 28 mmol/L (ref 22–32)
Calcium: 8.7 mg/dL — ABNORMAL LOW (ref 8.9–10.3)
Chloride: 102 mmol/L (ref 98–111)
Creatinine: 0.64 mg/dL (ref 0.44–1.00)
GFR, Est AFR Am: >60 mL/min (ref >60)
GFR, Estimated: >60 mL/min (ref >60)
Glucose, Bld: 88 mg/dL (ref 70–99)
Potassium: 4 mmol/L (ref 3.5–5.1)
Sodium: 137 mmol/L (ref 135–145)
Total Bilirubin: 0.5 mg/dL (ref 0.3–1.2)
Total Protein: 5.8 g/dL — ABNORMAL LOW (ref 6.5–8.1)

## 2018-03-16 LAB — PROTIME-INR
INR: 3.18
Prothrombin Time: 32.1 seconds — ABNORMAL HIGH (ref 11.4–15.2)

## 2018-03-16 NOTE — Telephone Encounter (Signed)
Patient already on schedule for lab this morning as requested per 1/2 schedule message. No other orders per 1/2 schedule message.

## 2018-03-17 ENCOUNTER — Ambulatory Visit
Admission: RE | Admit: 2018-03-17 | Discharge: 2018-03-17 | Disposition: A | Discharge status: Home / Self Care | Payer: Medicare Other | Source: Ambulatory Visit | Attending: Radiation Oncology | Admitting: Radiation Oncology

## 2018-03-17 DIAGNOSIS — Z853 Personal history of malignant neoplasm of breast: Secondary | ICD-10-CM | POA: Diagnosis not present

## 2018-03-17 DIAGNOSIS — C7931 Secondary malignant neoplasm of brain: Secondary | ICD-10-CM | POA: Diagnosis not present

## 2018-03-17 DIAGNOSIS — C159 Malignant neoplasm of esophagus, unspecified: Secondary | ICD-10-CM | POA: Diagnosis not present

## 2018-03-17 DIAGNOSIS — Z510 Encounter for antineoplastic radiation therapy: Secondary | ICD-10-CM | POA: Diagnosis not present

## 2018-03-17 DIAGNOSIS — C154 Malignant neoplasm of middle third of esophagus: Secondary | ICD-10-CM | POA: Diagnosis not present

## 2018-03-19 ENCOUNTER — Telehealth: Payer: Self-pay | Admitting: Hematology

## 2018-03-19 NOTE — Telephone Encounter (Signed)
Added lab 1/8 per 1/2 schedule message. Confirmed with [facility] lab is needed 1/8 and does not override 1/6 appointments. Left message for patient. Patient to get an updated schedule at 1/6 visit.

## 2018-03-20 ENCOUNTER — Inpatient Hospital Stay: Payer: Medicare Other

## 2018-03-20 ENCOUNTER — Inpatient Hospital Stay: Payer: Medicare Other | Admitting: Nutrition

## 2018-03-20 ENCOUNTER — Telehealth: Payer: Self-pay | Admitting: Hematology

## 2018-03-20 ENCOUNTER — Ambulatory Visit
Admission: RE | Admit: 2018-03-20 | Discharge: 2018-03-20 | Disposition: A | Discharge status: Home / Self Care | Payer: Medicare Other | Source: Ambulatory Visit | Attending: Radiation Oncology | Admitting: Radiation Oncology

## 2018-03-20 ENCOUNTER — Encounter: Payer: Self-pay | Admitting: Hematology

## 2018-03-20 ENCOUNTER — Ambulatory Visit (HOSPITAL_COMMUNITY)
Admission: RE | Admit: 2018-03-20 | Discharge: 2018-03-20 | Disposition: A | Discharge status: Home / Self Care | Payer: Medicare Other | Source: Ambulatory Visit | Attending: Hematology | Admitting: Hematology

## 2018-03-20 ENCOUNTER — Inpatient Hospital Stay (HOSPITAL_BASED_OUTPATIENT_CLINIC_OR_DEPARTMENT_OTHER): Payer: Medicare Other | Admitting: Hematology

## 2018-03-20 VITALS — BP 105/52 | HR 97 | Temp 98.2°F | Resp 18

## 2018-03-20 DIAGNOSIS — Z853 Personal history of malignant neoplasm of breast: Secondary | ICD-10-CM | POA: Diagnosis not present

## 2018-03-20 DIAGNOSIS — Z510 Encounter for antineoplastic radiation therapy: Secondary | ICD-10-CM | POA: Diagnosis not present

## 2018-03-20 DIAGNOSIS — I824Z2 Acute embolism and thrombosis of unspecified deep veins of left distal lower extremity: Secondary | ICD-10-CM

## 2018-03-20 DIAGNOSIS — C159 Malignant neoplasm of esophagus, unspecified: Secondary | ICD-10-CM | POA: Diagnosis not present

## 2018-03-20 DIAGNOSIS — J209 Acute bronchitis, unspecified: Secondary | ICD-10-CM | POA: Insufficient documentation

## 2018-03-20 DIAGNOSIS — Z87891 Personal history of nicotine dependence: Secondary | ICD-10-CM

## 2018-03-20 DIAGNOSIS — J449 Chronic obstructive pulmonary disease, unspecified: Secondary | ICD-10-CM

## 2018-03-20 DIAGNOSIS — C50911 Malignant neoplasm of unspecified site of right female breast: Secondary | ICD-10-CM | POA: Diagnosis not present

## 2018-03-20 DIAGNOSIS — R05 Cough: Secondary | ICD-10-CM | POA: Diagnosis not present

## 2018-03-20 DIAGNOSIS — Z5111 Encounter for antineoplastic chemotherapy: Secondary | ICD-10-CM | POA: Diagnosis not present

## 2018-03-20 DIAGNOSIS — E860 Dehydration: Secondary | ICD-10-CM

## 2018-03-20 DIAGNOSIS — R040 Epistaxis: Secondary | ICD-10-CM

## 2018-03-20 DIAGNOSIS — C7931 Secondary malignant neoplasm of brain: Secondary | ICD-10-CM | POA: Diagnosis not present

## 2018-03-20 DIAGNOSIS — Z95828 Presence of other vascular implants and grafts: Secondary | ICD-10-CM

## 2018-03-20 DIAGNOSIS — I82432 Acute embolism and thrombosis of left popliteal vein: Secondary | ICD-10-CM

## 2018-03-20 DIAGNOSIS — C801 Malignant (primary) neoplasm, unspecified: Secondary | ICD-10-CM

## 2018-03-20 DIAGNOSIS — C154 Malignant neoplasm of middle third of esophagus: Secondary | ICD-10-CM | POA: Diagnosis not present

## 2018-03-20 LAB — CBC WITH DIFFERENTIAL (CANCER CENTER ONLY)
Abs Immature Granulocytes: 0.04 10*3/uL (ref 0.00–0.07)
BASOS PCT: 0 %
Basophils Absolute: 0 10*3/uL (ref 0.0–0.1)
Eosinophils Absolute: 0 10*3/uL (ref 0.0–0.5)
Eosinophils Relative: 0 %
HCT: 32.5 % — ABNORMAL LOW (ref 36.0–46.0)
Hemoglobin: 10.5 g/dL — ABNORMAL LOW (ref 12.0–15.0)
Immature Granulocytes: 2 %
Lymphocytes Relative: 7 %
Lymphs Abs: 0.2 10*3/uL — ABNORMAL LOW (ref 0.7–4.0)
MCH: 30.7 pg (ref 26.0–34.0)
MCHC: 32.3 g/dL (ref 30.0–36.0)
MCV: 95 fL (ref 80.0–100.0)
Monocytes Absolute: 0.3 10*3/uL (ref 0.1–1.0)
Monocytes Relative: 12 %
NEUTROS ABS: 2.1 10*3/uL (ref 1.7–7.7)
Neutrophils Relative %: 79 %
Platelet Count: 155 10*3/uL (ref 150–400)
RBC: 3.42 MIL/uL — ABNORMAL LOW (ref 3.87–5.11)
RDW: 14.9 % (ref 11.5–15.5)
WBC Count: 2.7 10*3/uL — ABNORMAL LOW (ref 4.0–10.5)
nRBC: 1.1 % — ABNORMAL HIGH (ref 0.0–0.2)

## 2018-03-20 LAB — CMP (CANCER CENTER ONLY)
ALT: 24 U/L (ref 0–44)
AST: 13 U/L — ABNORMAL LOW (ref 15–41)
Albumin: 2.3 g/dL — ABNORMAL LOW (ref 3.5–5.0)
Alkaline Phosphatase: 118 U/L (ref 38–126)
Anion gap: 9 (ref 5–15)
BILIRUBIN TOTAL: 0.5 mg/dL (ref 0.3–1.2)
BUN: 10 mg/dL (ref 8–23)
CO2: 26 mmol/L (ref 22–32)
Calcium: 9 mg/dL (ref 8.9–10.3)
Chloride: 102 mmol/L (ref 98–111)
Creatinine: 0.69 mg/dL (ref 0.44–1.00)
GFR, Est AFR Am: >60 mL/min (ref >60)
GFR, Estimated: >60 mL/min (ref >60)
Glucose, Bld: 148 mg/dL — ABNORMAL HIGH (ref 70–99)
Potassium: 3.8 mmol/L (ref 3.5–5.1)
Sodium: 137 mmol/L (ref 135–145)
TOTAL PROTEIN: 5.7 g/dL — AB (ref 6.5–8.1)

## 2018-03-20 LAB — PROTIME-INR
INR: 1.92
Prothrombin Time: 21.7 seconds — ABNORMAL HIGH (ref 11.4–15.2)

## 2018-03-20 MED ORDER — AMOXICILLIN-POT CLAVULANATE 875-125 MG PO TABS: 1.0000 | ORAL_TABLET | Freq: Two times a day (BID) | ORAL | 0 refills | Status: DC

## 2018-03-20 MED ORDER — HEPARIN SOD (PORK) LOCK FLUSH 100 UNIT/ML IV SOLN
500.0000 [IU] | Freq: Once | INTRAVENOUS | Status: AC | PRN
  Administered 2018-03-20: 500 [IU]
  Filled 2018-03-20: qty 5

## 2018-03-20 MED ORDER — SODIUM CHLORIDE 0.9 % IV SOLN
INTRAVENOUS | Status: DC
  Administered 2018-03-20: 11:00:00 via INTRAVENOUS
  Filled 2018-03-20 (×2): qty 250

## 2018-03-20 MED ORDER — SODIUM CHLORIDE 0.9 % IV SOLN
Freq: Once | INTRAVENOUS | Status: AC
  Administered 2018-03-20: 11:00:00 via INTRAVENOUS
  Filled 2018-03-20: qty 250

## 2018-03-20 MED ORDER — SODIUM CHLORIDE 0.9% FLUSH
10.0000 mL | INTRAVENOUS | Status: DC | PRN
  Administered 2018-03-20: 10 mL via INTRAVENOUS
  Filled 2018-03-20: qty 10

## 2018-03-20 NOTE — Patient Instructions (Addendum)
NO CHEMO TODAY.  PATIENT TO HAVE CHEST XRAY AFTER IV FLUIDS GIVEN.   Dehydration, Adult  Dehydration is a condition in which there is not enough fluid or water in the body. This happens when you lose more fluids than you take in. Important organs, such as the kidneys, brain, and heart, cannot function without a proper amount of fluids. Any loss of fluids from the body can lead to dehydration. Dehydration can range from mild to severe. This condition should be treated right away to prevent it from becoming severe. What are the causes? This condition may be caused by:  Vomiting.  Diarrhea.  Excessive sweating, such as from heat exposure or exercise.  Not drinking enough fluid, especially: ? When ill. ? While doing activity that requires a lot of energy.  Excessive urination.  Fever.  Infection.  Certain medicines, such as medicines that cause the body to lose excess fluid (diuretics).  Inability to access safe drinking water.  Reduced physical ability to get adequate water and food. What increases the risk? This condition is more likely to develop in people:  Who have a poorly controlled long-term (chronic) illness, such as diabetes, heart disease, or kidney disease.  Who are age 69 or older.  Who are disabled.  Who live in a place with high altitude.  Who play endurance sports. What are the signs or symptoms? Symptoms of mild dehydration may include:  Thirst.  Dry lips.  Slightly dry mouth.  Dry, warm skin.  Dizziness. Symptoms of moderate dehydration may include:  Very dry mouth.  Muscle cramps.  Dark urine. Urine may be the color of tea.  Decreased urine production.  Decreased tear production.  Heartbeat that is irregular or faster than normal (palpitations).  Headache.  Light-headedness, especially when you stand up from a sitting position.  Fainting (syncope). Symptoms of severe dehydration may include:  Changes in skin, such as: ? Cold  and clammy skin. ? Blotchy (mottled) or pale skin. ? Skin that does not quickly return to normal after being lightly pinched and released (poor skin turgor).  Changes in body fluids, such as: ? Extreme thirst. ? No tear production. ? Inability to sweat when body temperature is high, such as in hot weather. ? Very little urine production.  Changes in vital signs, such as: ? Weak pulse. ? Pulse that is more than 100 beats a minute when sitting still. ? Rapid breathing. ? Low blood pressure.  Other changes, such as: ? Sunken eyes. ? Cold hands and feet. ? Confusion. ? Lack of energy (lethargy). ? Difficulty waking up from sleep. ? Short-term weight loss. ? Unconsciousness. How is this diagnosed? This condition is diagnosed based on your symptoms and a physical exam. Blood and urine tests may be done to help confirm the diagnosis. How is this treated? Treatment for this condition depends on the severity. Mild or moderate dehydration can often be treated at home. Treatment should be started right away. Do not wait until dehydration becomes severe. Severe dehydration is an emergency and it needs to be treated in a hospital. Treatment for mild dehydration may include:  Drinking more fluids.  Replacing salts and minerals in your blood (electrolytes) that you may have lost. Treatment for moderate dehydration may include:  Drinking an oral rehydration solution (ORS). This is a drink that helps you replace fluids and electrolytes (rehydrate). It can be found at pharmacies and retail stores. Treatment for severe dehydration may include:  Receiving fluids through an IV tube.  Receiving an electrolyte solution through a feeding tube that is passed through your nose and into your stomach (nasogastric tube, or NG tube).  Correcting any abnormalities in electrolytes.  Treating the underlying cause of dehydration. Follow these instructions at home:  If directed by your health care  provider, drink an ORS: ? Make an ORS by following instructions on the package. ? Start by drinking small amounts, about  cup (120 mL) every 5-10 minutes. ? Slowly increase how much you drink until you have taken the amount recommended by your health care provider.  Drink enough clear fluid to keep your urine clear or pale yellow. If you were told to drink an ORS, finish the ORS first, then start slowly drinking other clear fluids. Drink fluids such as: ? Water. Do not drink only water. Doing that can lead to having too little salt (sodium) in the body (hyponatremia). ? Ice chips. ? Fruit juice that you have added water to (diluted fruit juice). ? Low-calorie sports drinks.  Avoid: ? Alcohol. ? Drinks that contain a lot of sugar. These include high-calorie sports drinks, fruit juice that is not diluted, and soda. ? Caffeine. ? Foods that are greasy or contain a lot of fat or sugar.  Take over-the-counter and prescription medicines only as told by your health care provider.  Do not take sodium tablets. This can lead to having too much sodium in the body (hypernatremia).  Eat foods that contain a healthy balance of electrolytes, such as bananas, oranges, potatoes, tomatoes, and spinach.  Keep all follow-up visits as told by your health care provider. This is important. Contact a health care provider if:  You have abdominal pain that: ? Gets worse. ? Stays in one area (localizes).  You have a rash.  You have a stiff neck.  You are more irritable than usual.  You are sleepier or more difficult to wake up than usual.  You feel weak or dizzy.  You feel very thirsty.  You have urinated only a small amount of very dark urine over 6-8 hours. Get help right away if:  You have symptoms of severe dehydration.  You cannot drink fluids without vomiting.  Your symptoms get worse with treatment.  You have a fever.  You have a severe headache.  You have vomiting or diarrhea  that: ? Gets worse. ? Does not go away.  You have blood or green matter (bile) in your vomit.  You have blood in your stool. This may cause stool to look black and tarry.  You have not urinated in 6-8 hours.  You faint.  Your heart rate while sitting still is over 100 beats a minute.  You have trouble breathing. This information is not intended to replace advice given to you by your health care provider. Make sure you discuss any questions you have with your health care provider. Document Released: 03/01/2005 Document Revised: 09/26/2015 Document Reviewed: 04/25/2015 Elsevier Interactive Patient Education  2019 [employer].

## 2018-03-20 NOTE — Progress Notes (Signed)
73 year old female diagnosed with EsophagealAdenocarcinoma,with oligo met incerebellum, stage IV and RightBreastInvasive Ductal Carcinoma,[id number] stage IA,Grade II, ER/PR Positive, HER 2 negative  Past medical history includes left breast cancer treated with lumpectomy and adjuvant chemoradiation therapy, peripheral vascular disease, hyperlipidemia, COPD, cervical/vaginal cancer.  Medications include Decadron, lidocaine, Ativan, Reglan, Prilosec, Zofran, and Compazine.  Labs include glucose 148 and albumin 2.3 on January 6.  Height: 5 feet 0 inches. Weight: 211.8 pounds December 30. Usual body weight: 233 pounds on July 29. BMI: 41.36.  Patient reports she does not like milk, Ensure, or boost. She will drink milkshakes and is trying to make a smoothie on a daily basis. She is tolerating very soft foods and loves egg drop soup, pinto beans and potatoes. She prefers liquids and solids at room temperature. Reports some constipation but will begin MiraLAX. Patient has lost 9% of her body weight over 6 months which is not significant.  Nutrition diagnosis: Unintentional weight loss related to esophageal carcinoma and associated dysphasia as evidenced by approximate 21 pound weight loss over 6 months  Intervention: Educated patient to consume smaller more frequent meals and snacks choosing higher calorie, higher protein foods. Encourage patient to strive for weight maintenance. Reviewed multiple suggestions for patient to blenderize or [employer] foods to ease swallowing. I provided fact sheets.  Questions were answered.  Teach back method used.  Contact information provided.  Monitoring, evaluation, goals: Patient will tolerate adequate calories and protein to promote weight maintenance throughout treatment.  Next visit: Monday, January 13 during infusion.  **Disclaimer: This note was dictated with voice recognition software. Similar sounding words can inadvertently be transcribed and  this note may contain transcription errors which may not have been corrected upon publication of note.**

## 2018-03-20 NOTE — Progress Notes (Signed)
Per Dr Burr Medico no chemo today.  Pt to received 2hrs of hydration and then go have chest xray.  Pt / family verbalize understanding of plan.

## 2018-03-20 NOTE — Telephone Encounter (Signed)
Spoke with patient spouse while in treatment. Patient will be going to x-ray right after to get a chest x-ray.

## 2018-03-20 NOTE — Progress Notes (Signed)
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    01/30/2018 Initial Diagnosis    Metastasis to brain [facility])    01/31/2018 Imaging    MRI Brain W WO Contrast 01/31/18  IMPRESSION: 3 Tesla study does not disclose any additional lesions. Solitary metastasis in the right superior cerebellum measuring 2.9 x 2.6 x 3.0 cm with mild surrounding edema. 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    02/03/2018 - 02/08/2018 Radiation Therapy    SBRT with Dr. Isidore Moos on 02/03/18, 02/06/18, 02/08/18     Esophageal cancer, stage IV ([place])   01/08/2018 Imaging    CT AP W Constrast 01/08/18  IMPRESSION: 1. Nonobstructed, nondistended bowel. No acute inflammatory process. 2. Moderate-sized hiatal hernia noted. 3. Low-density 1.7 cm left adrenal nodule likely to represent a small adenoma. Lower pole left renal cyst measuring 1.8 cm. 4. Moderate aortic atherosclerosis. 5. Lumbar spondylosis and facet arthropathy.    01/27/2018 Imaging    CT Chest W Contrast 01/27/18  IMPRESSION: 1. Large mass involving the distal half of the esophagus spanning approximately 11.7 cm is identified compatible with primary esophageal  neoplasm. 2. Small posterior mediastinal and high right paratracheal lymph nodes are noted which may represent foci of metastatic adenopathy. 3. The small pulmonary nodules in the right lung are unchanged from 01/15/2016 and are favored to represent a benign process. 4. Aortic Atherosclerosis (ICD10-I70.0) and Emphysema (ICD10-J43.9). 5. Coronary artery atherosclerotic calcifications.    01/30/2018 Initial Diagnosis    Esophageal cancer, stage IV ([place])    02/03/2018 - 02/08/2018 Radiation Therapy    SBRT to oligo brain metastasis, in 3 fractions, under Dr. Isidore Moos     02/16/2018 PET scan    PET 02/16/18 IMPRESSION: 1. A 15.7 cm in length esophageal mass extends from the aortic arch level down to the distal esophagus, maximum SUV 22.4, compatible with malignancy. There is a small right lower neck level IV lymph node with lower than mediastinal blood pool activity, and a right upper paratracheal lymph node with just above mediastinal blood pool activity. 2. Faint speckled heterogeneity in the liver, for example in the lateral segment left hepatic lobe, is likely incidental/benign given the lack of correlate of CT finding. It might be prudent to obtain an MRI just to make sure that there is not a small early metastatic lesion in this vicinity. 3. Known right cerebellar vermian metastatic lesion, with expected hypo activity compared to the surrounding brain activity. 4. The right middle lobe pulmonary nodule measures 1.1 by 1.0 cm and is mildly hypermetabolic with maximum SUV 2.6. This lesion measured 6 by 5 mm back on 12/06/2003 and 1.2 by 1.1 cm on 10/15/2015. I am uncertain what to make of the very slow growth and mildly  accentuated metabolic activity. This could be a chronic granulomatous process. Low-grade adenocarcinoma of the lung seems unlikely to be so indolent as to only mildly increase over a 14 year period, but is presumably a differential diagnostic  consideration. Surveillance is suggested. 5. Other imaging findings of potential clinical significance: Aortic Atherosclerosis (ICD10-I70.0). Descending and sigmoid colon diverticulosis. Gas in the urinary bladder, most common cause would be recent catheterization. Minimal chronic left ethmoid sinusitis.     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 Ultrasound-guided biopsy is recommended.    01/10/2018 Initial Biopsy    Diagnsis from Breast Biopsy 01/10/18  Breast, right, needle core biopsy, 6 o'clock - INVASIVE DUCTAL CARCINOMA WITH CALCIFICATIONS, SEE COMMENT. - DUCTAL CARCINOMA IN SITU. 1 of 2 FINAL for Kristin Williams, Kristin Williams [phone]) Microscopic Comment The carcinoma appears grade 2. Prognostic markers will be ordered. Dr. [name] has reviewed the case. The case was called to [place] on 01/11/2018.    01/10/2018 Receptors her2    The tumor cells are NEGATIVE for Her2 (1+). Estrogen Receptor: 100%, POSITIVE, STRONG STAINING INTENSITY Progesterone Receptor: 100%, POSITIVE, STRONG STAINING INTENSITY Proliferation Marker Ki67: 15%    01/10/2018 Cancer Staging    Staging form: Breast, AJCC 8th Edition - Clinical stage from 01/10/2018: Stage IA (cT1b, cN0, cM0, G2, ER+, PR+, HER2-) - Signed by [name], MD on 02/03/2018    01/30/2018 Initial Diagnosis    Breast cancer ([place])      CURRENT THERAPY:  weekly carboplatin and taxol with concurrent radiation starting on 02/27/2018  INTERVAL HISTORY:  Kristin Williams is here for a follow up and treatment.She presents to the clinic today accompanied by  family. She notes her cough has progressed with yellowish phlegm. She has not tried Mucinex or medication for this yet. She denies fever or chills. She has recently stopped smoking. She did have Bronchitis in the past but no hospitalization required. She denies SOB. She uses inhaler only as needed. Her daughter notes she is drinking about 8 ounces of liquid a day due to pain. She notes she has been taking 72m Coumadin. She notes epistaxis only dry nose at in the morning when she first wakes up.   REVIEW OF SYSTEMS:   Constitutional: Denies fevers, chills or abnormal weight loss Eyes: Denies blurriness of vision Ears, nose, mouth, throat, and face: Denies mucositis or sore throat (+) mild epistaxis  Respiratory: Denies dyspnea or wheezes (+) Cough with yellowish phlegm Cardiovascular: Denies palpitation, chest discomfort or lower extremity swelling Gastrointestinal:  Denies nausea, heartburn or change in bowel habits Skin: Denies abnormal skin rashes Lymphatics: Denies new lymphadenopathy or easy bruising Neurological:Denies numbness, tingling or new weaknesses Behavioral/Psych: Mood is stable, no new changes  All other systems were reviewed with the patient and are negative.  MEDICAL HISTORY:  Past Medical History:  Diagnosis Date  . Arthritis   . Cancer (HCC)    Cervical  . Cancer ([place]    Breast  . Cancer ([place]    Vaginal  . Complication of anesthesia   . COPD (chronic obstructive pulmonary disease) ([place]   . Headache(784.0)   .  Hyperlipidemia   . Leg pain   . Peripheral vascular disease ([place])   . Personal history of chemotherapy   . Personal history of radiation therapy   . PONV (postoperative nausea and vomiting)     SURGICAL HISTORY: Past Surgical History:  Procedure Laterality Date  . ABDOMINAL HYSTERECTOMY  1975  . BREAST LUMPECTOMY  2005  . GANGLION CYST EXCISION  1977  . ILIAC ARTERY STENT  04/20/2004   CSD right iliac occlusive disease  . IR IMAGING GUIDED PORT  INSERTION  02/22/2018    I have reviewed the social history and family history with the patient and they are unchanged from previous note.  ALLERGIES:  is allergic to flonase [fluticasone propionate].  MEDICATIONS:  Current Outpatient Medications  Medication Sig Dispense Refill  . acetaminophen (TYLENOL) 160 MG/5ML liquid Take 325 mg by mouth every 4 (four) hours as needed for fever.    Marland Kitchen amoxicillin-clavulanate (AUGMENTIN) 875-125 MG tablet Take 1 tablet by mouth 2 (two) times daily. 10 tablet 0  . dexamethasone (DECADRON) 4 MG tablet Take 1 tablet (4 mg total) by mouth daily. Take 4 mg daily for 2 to 3 days after chemotherapy treatment 30 tablet 1  . enoxaparin (LOVENOX) 150 MG/ML injection Inject 0.93 mLs (140 mg total) into the skin daily for 2 days. 12/21 and 12/22 2 Syringe 0  . HYDROcodone-homatropine (HYCODAN) 5-1.5 MG/5ML syrup Take 5 mLs by mouth every 6 (six) hours as needed for cough. 120 mL 0  . lidocaine (XYLOCAINE) 2 % solution Patient: Mix 1part 2% viscous lidocaine, 1part H20. Swallow 28m of diluted mixture, 251m before meals and at bedtime, up to QID 100 mL 5  . lidocaine-prilocaine (EMLA) cream Apply 1 application topically as needed. 30 g 1  . LORazepam (ATIVAN) 1 MG tablet Take 1 tablet (1 mg total) by mouth 3 (three) times daily as needed for anxiety. 15 tablet 0  . metoCLOPramide (REGLAN) 10 MG tablet Take 1 tablet (10 mg total) by mouth every 6 (six) hours as needed for nausea. 60 tablet 2  . omeprazole (PRILOSEC) 20 MG capsule Take 40 mg by mouth daily.     . ondansetron (ZOFRAN) 8 MG tablet Take 1 tablet (8 mg total) by mouth every 8 (eight) hours as needed for nausea or vomiting. 20 tablet 0  . prochlorperazine (COMPAZINE) 10 MG tablet Take 1 tablet (10 mg total) by mouth every 6 (six) hours as needed for nausea or vomiting. 30 tablet 0  . sucralfate (CARAFATE) 1 g tablet Dissolve 1 tablet in 10 mL H20 and swallow up to QID to soothe throat. 40 tablet 5  . warfarin  (COUMADIN) 2.5 MG tablet Take 2 tablets once daily with supper and then as directed by MD 90 tablet 2   No current facility-administered medications for this visit.     PHYSICAL EXAMINATION: ECOG PERFORMANCE STATUS: 2 - Symptomatic, <50% confined to bed Blood pressure 105/52, heart rate 97, respirate 18, pulse ox 96% on room air, temperature 36.8 GENERAL:alert, no distress and comfortable SKIN: skin color, texture, turgor are normal, no rashes or significant lesions EYES: normal, Conjunctiva are pink and non-injected, sclera clear OROPHARYNX:no exudate, no erythema and lips, buccal mucosa, and tongue normal  NECK: supple, thyroid normal size, non-tender, without nodularity LYMPH:  no palpable lymphadenopathy in the cervical, axillary or inguinal LUNGS: clear percussion with normal breathing effort (+) Rhonchi at base of b/l lungs  HEART: regular rate & rhythm and no murmurs and no lower extremity edema  ABDOMEN:abdomen soft, non-tender and normal bowel sounds Musculoskeletal:no cyanosis of digits and no clubbing  NEURO: alert & oriented x 3 with fluent speech, no focal motor/sensory deficits  LABORATORY DATA:  I have reviewed the data as listed CBC Latest Ref Rng & Units 03/20/2018 03/16/2018 03/13/2018  WBC 4.0 - 10.5 K/uL 2.7(L) 2.0(L) 2.8(L)  Hemoglobin 12.0 - 15.0 g/dL 10.5(L) 10.7(L) 10.8(L)  Hematocrit 36.0 - 46.0 % 32.5(L) 32.4(L) 33.2(L)  Platelets 150 - 400 K/uL 155 173 150     CMP Latest Ref Rng & Units 03/20/2018 03/16/2018 03/13/2018  Glucose 70 - 99 mg/dL 148(H) 88 115(H)  BUN 8 - 23 mg/dL '10 13 13  ' Creatinine 0.44 - 1.00 mg/dL 0.69 0.64 0.67  Sodium 135 - 145 mmol/L 137 137 138  Potassium 3.5 - 5.1 mmol/L 3.8 4.0 4.0  Chloride 98 - 111 mmol/L 102 102 105  CO2 22 - 32 mmol/L '26 28 25  ' Calcium 8.9 - 10.3 mg/dL 9.0 8.7(L) 8.7(L)  Total Protein 6.5 - 8.1 g/dL 5.7(L) 5.8(L) 5.5(L)  Total Bilirubin 0.3 - 1.2 mg/dL 0.5 0.5 0.5  Alkaline Phos 38 - 126 U/L 118 84 91  AST 15 - 41  U/L 13(L) 14(L) 12(L)  ALT 0 - 44 U/L '24 27 26      ' RADIOGRAPHIC STUDIES: I have personally reviewed the radiological images as listed and agreed with the findings in the report. Dg Chest 2 View  Result Date: 03/20/2018 CLINICAL DATA:  Productive cough EXAM: CHEST - 2 VIEW COMPARISON:  02/13/2018 FINDINGS: Cardiac shadows within normal limits. Right-sided chest wall port is noted with the tip in the mid superior vena cava. The lungs are well aerated bilaterally without focal infiltrate or sizable effusion. No acute bony abnormality is noted. IMPRESSION: No acute abnormality seen. Electronically Signed   By: Inez Catalina M.D.   On: 03/20/2018 13:38     ASSESSMENT & PLAN:  Kristin Williams is a 73 y.o. female with   1. EsophagealAdenocarcinoma,with oligo met incerebellum, stage IV  -She was recently diagnosed with esophogeal cancer,with oligometastasisto the brain. Given her stage IV disease her cancer is not curable but still treatable. Goal of care it to control her disease.  -She completedSBRTto her brain meton11/27/2019.  -I personally reviewed her PET from 02/16/18 and discussed with pt and her family, it showed lung nodule she had since 2017 and has been getting smaller, likely benign. She has a benign cysts in her kidney. There is a mildly hypermetabolic right supraclavicular LN. No other evidence of distant metastasis. Her primary esophageal mass is very long and hot on PET, along with a hypermetabolic right upper paratracheal node.  -She startedconcurrent chemo RTon 02/27/2018 with weekly carboplatin and taxol for better local disease control. -if she can tolerate, I will consider FOLFOX 3 months as consolidation after chemoRT -Her cough progressed with yellowish phlegm. I will get chest XR, start her on antibiotics and hold chemo this week and give IVFs. I strongly encouraged her to increase her water intake.  -Will f/u in 1 week    2. RightBreastInvasive Ductal  Carcinoma,cT1bN0M0 stage IA,Grade II, ER/PR Positive, HER 2 negative -Diagnosed through screening mammogram, same time as her esophageal cancer in October 2019.  -Given her early stage breast cancer, I previously discussedher breast cancer treatment is less urgent than esophogeal cancertreatment. -I plan tostart her onanti-estrogen therapyafter she completes chemoradiation for esophageal cancer, given her ER/PR positive cancer.If shedoes very well in 6 to 12 monthswithoutesophageal cancer without progression,we  mayconsider breast surgery.   3.Oligo brain mets -This is likely from her primaryesophogealcancer.  -She does have balance issues and ambulates with assistance. She knows she is at risk for fall and that she should be careful. -She is s/p SBRT. Has mild slurred speech, right sided weakness and balance stability continues to improve -Will monitor with brain scan every 3 months.  4. H/o Left breast cancer, ER/PR negative, treated by Dr. Jana Williams with lumpectomy, adj, chemo and radiation. -She was previously referred to genetics  -no evidence of recurrence    5. Dysphagia, nausea, low appetite  -secondary toher esophageal cancer and chemoradiation. -She does not like ensure or boost. She will try carnation breakfast and juicing her food. -Continue Lidocaine and Sulcrafate before meals to help her eat.  -She does not tolerate Compazine. She has Zofran but Reglan gives her the best relief. Nausea is currentlycontrolled and stable.  -She completed Dexa taper dose 03/10/18. OK to use dexa 57m daily for 3-5 days after chemo -I think she did not need a feeding tube for now, we previously discussed her dysphagia and odynophagia make it worse during chemo and radiation, and the possibility she may needs a feeding tube. -Stable.   6.Acute deep vein thrombosis (DVT) of popliteal vein of left lower extremity, diagnosed on 02/28/2018  -She was found to have left  lower extremity edema on his first day of chemoradiation, Doppler showed DVT -Due to the risk of GI bleeding and intracranial hemorrhage from brain metastasis, will start her on Coumadin with Lovenox bridging,dose adjust as needed.  -She has loose tooth and would like to get this removed by dentist if she has to. I instructed her to tell her dentist she is on Coumadin before proceeding.  -Today's INR at 1.92 (03/20/2018). Will change coumadin to 5 mg daily except  2.561mon Monday and Friday.  Will repeat lab next week.  -She only has mild epistaxis in the morning from dry nose. 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  -Stable   8.Goal of care discussion  -she is full code now  9. COPD and acute bronchitis  -She tried hycodan will mild relief.  -no fever or chills, but has started to have yellowish phlegm production last week.  -Will do Chest Xray today and call in antibiotics  -She can start OTC Mucinex     PLAN: -No chemo today  -1 Liter IVFs today [facility]  -I called in Augmentin 875 mg twice daily for 5 days -Lab, flush, f/u and chemo in 1 weeks  -she will continue radiation -repeat PT/INR in one week   No problem-specific Assessment & Plan notes found for this encounter.   Orders Placed This Encounter  Procedures  . DG Chest 2 View    Standing Status:   Future    Number of Occurrences:   1    Standing Expiration Date:   03/20/2019    Order Specific Question:   Reason for Exam (SYMPTOM  OR DIAGNOSIS REQUIRED)    Answer:   productive cough, rule out pulmonary infections    Order Specific Question:   [facility]    [facility]  Order Specific Question:   Radiology Contrast Protocol - do NOT remove file path    Answer:    \\  [email or web address]   All questions were answered. The patient knows to call the clinic with any problems, questions or concerns. No barriers to learning was detected. I spent 20 minutes counseling the patient face to face. The total time spent in the appointment was 25 minutes and more than 50% was on counseling and review of test results     [name], MD 03/20/2018   I, Joslyn Devon, am acting as scribe for [name], MD.   I have reviewed the above documentation for accuracy and completeness, and I agree with the above.

## 2018-03-21 ENCOUNTER — Ambulatory Visit
Admission: RE | Admit: 2018-03-21 | Discharge: 2018-03-21 | Disposition: A | Discharge status: Home / Self Care | Payer: Medicare Other | Source: Ambulatory Visit | Attending: Radiation Oncology | Admitting: Radiation Oncology

## 2018-03-21 ENCOUNTER — Telehealth: Payer: Self-pay | Admitting: *Deleted

## 2018-03-21 DIAGNOSIS — C159 Malignant neoplasm of esophagus, unspecified: Secondary | ICD-10-CM | POA: Diagnosis not present

## 2018-03-21 DIAGNOSIS — C154 Malignant neoplasm of middle third of esophagus: Secondary | ICD-10-CM | POA: Diagnosis not present

## 2018-03-21 DIAGNOSIS — Z853 Personal history of malignant neoplasm of breast: Secondary | ICD-10-CM | POA: Diagnosis not present

## 2018-03-21 DIAGNOSIS — C7931 Secondary malignant neoplasm of brain: Secondary | ICD-10-CM | POA: Diagnosis not present

## 2018-03-21 DIAGNOSIS — Z510 Encounter for antineoplastic radiation therapy: Secondary | ICD-10-CM | POA: Diagnosis not present

## 2018-03-21 NOTE — Telephone Encounter (Signed)
Notified that CXR was normal. Daughter states they started antibiotics yesterday

## 2018-03-22 ENCOUNTER — Ambulatory Visit
Admission: RE | Admit: 2018-03-22 | Discharge: 2018-03-22 | Disposition: A | Discharge status: Home / Self Care | Payer: Medicare Other | Source: Ambulatory Visit | Attending: Radiation Oncology | Admitting: Radiation Oncology

## 2018-03-22 ENCOUNTER — Other Ambulatory Visit: Payer: Medicare Other

## 2018-03-22 DIAGNOSIS — Z510 Encounter for antineoplastic radiation therapy: Secondary | ICD-10-CM | POA: Diagnosis not present

## 2018-03-22 DIAGNOSIS — Z853 Personal history of malignant neoplasm of breast: Secondary | ICD-10-CM | POA: Diagnosis not present

## 2018-03-22 DIAGNOSIS — C159 Malignant neoplasm of esophagus, unspecified: Secondary | ICD-10-CM | POA: Diagnosis not present

## 2018-03-22 DIAGNOSIS — C154 Malignant neoplasm of middle third of esophagus: Secondary | ICD-10-CM | POA: Diagnosis not present

## 2018-03-22 DIAGNOSIS — C7931 Secondary malignant neoplasm of brain: Secondary | ICD-10-CM | POA: Diagnosis not present

## 2018-03-23 ENCOUNTER — Telehealth: Payer: Self-pay

## 2018-03-23 ENCOUNTER — Ambulatory Visit
Admission: RE | Admit: 2018-03-23 | Discharge: 2018-03-23 | Disposition: A | Discharge status: Home / Self Care | Payer: Medicare Other | Source: Ambulatory Visit | Attending: Radiation Oncology | Admitting: Radiation Oncology

## 2018-03-23 DIAGNOSIS — Z853 Personal history of malignant neoplasm of breast: Secondary | ICD-10-CM | POA: Diagnosis not present

## 2018-03-23 DIAGNOSIS — C154 Malignant neoplasm of middle third of esophagus: Secondary | ICD-10-CM | POA: Diagnosis not present

## 2018-03-23 DIAGNOSIS — C7931 Secondary malignant neoplasm of brain: Secondary | ICD-10-CM | POA: Diagnosis not present

## 2018-03-23 DIAGNOSIS — Z510 Encounter for antineoplastic radiation therapy: Secondary | ICD-10-CM | POA: Diagnosis not present

## 2018-03-23 DIAGNOSIS — C159 Malignant neoplasm of esophagus, unspecified: Secondary | ICD-10-CM | POA: Diagnosis not present

## 2018-03-23 NOTE — Telephone Encounter (Signed)
Spoke with patient's daughter Joelene Millin, informed her patient is to take Coumadin 2.5 mg on Monday and Friday and 5 mg on all other days including Saturday and Sunday.  Repeat lab on Monday 1/13, she verbalized an understanding.

## 2018-03-24 ENCOUNTER — Ambulatory Visit
Admission: RE | Admit: 2018-03-24 | Discharge: 2018-03-24 | Disposition: A | Discharge status: Home / Self Care | Payer: Medicare Other | Source: Ambulatory Visit | Attending: Radiation Oncology | Admitting: Radiation Oncology

## 2018-03-24 DIAGNOSIS — Z510 Encounter for antineoplastic radiation therapy: Secondary | ICD-10-CM | POA: Diagnosis not present

## 2018-03-24 DIAGNOSIS — C7931 Secondary malignant neoplasm of brain: Secondary | ICD-10-CM | POA: Diagnosis not present

## 2018-03-24 DIAGNOSIS — C159 Malignant neoplasm of esophagus, unspecified: Secondary | ICD-10-CM | POA: Diagnosis not present

## 2018-03-24 DIAGNOSIS — C154 Malignant neoplasm of middle third of esophagus: Secondary | ICD-10-CM | POA: Diagnosis not present

## 2018-03-24 DIAGNOSIS — Z853 Personal history of malignant neoplasm of breast: Secondary | ICD-10-CM | POA: Diagnosis not present

## 2018-03-24 NOTE — Progress Notes (Signed)
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    01/30/2018 Initial Diagnosis    Metastasis to brain [facility])    01/31/2018 Imaging    MRI Brain W WO Contrast 01/31/18  IMPRESSION: 3 Tesla study does not disclose any additional lesions. Solitary metastasis in the right superior cerebellum measuring 2.9 x 2.6 x 3.0 cm with mild surrounding edema. 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    02/03/2018 - 02/08/2018 Radiation Therapy    SBRT with Dr. Isidore Moos on 02/03/18, 02/06/18, 02/08/18     Esophageal cancer, stage IV ([place])   01/08/2018 Imaging    CT AP W Constrast 01/08/18  IMPRESSION: 1. Nonobstructed, nondistended bowel. No acute inflammatory process. 2. Moderate-sized hiatal hernia noted. 3. Low-density 1.7 cm left adrenal nodule likely to represent a small adenoma. Lower pole left renal cyst measuring 1.8 cm. 4. Moderate aortic atherosclerosis. 5. Lumbar spondylosis and facet arthropathy.    01/27/2018 Imaging    CT Chest W Contrast 01/27/18  IMPRESSION: 1. Large mass involving the distal half of the esophagus spanning approximately 11.7 cm is identified compatible with primary esophageal  neoplasm. 2. Small posterior mediastinal and high right paratracheal lymph nodes are noted which may represent foci of metastatic adenopathy. 3. The small pulmonary nodules in the right lung are unchanged from 01/15/2016 and are favored to represent a benign process. 4. Aortic Atherosclerosis (ICD10-I70.0) and Emphysema (ICD10-J43.9). 5. Coronary artery atherosclerotic calcifications.    01/30/2018 Initial Diagnosis    Esophageal cancer, stage IV ([place])    02/03/2018 - 02/08/2018 Radiation Therapy    SBRT to oligo brain metastasis, in 3 fractions, under Dr. Isidore Moos     02/16/2018 PET scan    PET 02/16/18 IMPRESSION: 1. A 15.7 cm in length esophageal mass extends from the aortic arch level down to the distal esophagus, maximum SUV 22.4, compatible with malignancy. There is a small right lower neck level IV lymph node with lower than mediastinal blood pool activity, and a right upper paratracheal lymph node with just above mediastinal blood pool activity. 2. Faint speckled heterogeneity in the liver, for example in the lateral segment left hepatic lobe, is likely incidental/benign given the lack of correlate of CT finding. It might be prudent to obtain an MRI just to make sure that there is not a small early metastatic lesion in this vicinity. 3. Known right cerebellar vermian metastatic lesion, with expected hypo activity compared to the surrounding brain activity. 4. The right middle lobe pulmonary nodule measures 1.1 by 1.0 cm and is mildly hypermetabolic with maximum SUV 2.6. This lesion measured 6 by 5 mm back on 12/06/2003 and 1.2 by 1.1 cm on 10/15/2015. I am uncertain what to make of the very slow growth and mildly  accentuated metabolic activity. This could be a chronic granulomatous process. Low-grade adenocarcinoma of the lung seems unlikely to be so indolent as to only mildly increase over a 14 year period, but is presumably a differential diagnostic  consideration. Surveillance is suggested. 5. Other imaging findings of potential clinical significance: Aortic Atherosclerosis (ICD10-I70.0). Descending and sigmoid colon diverticulosis. Gas in the urinary bladder, most common cause would be recent catheterization. Minimal chronic left ethmoid sinusitis.     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 Ultrasound-guided biopsy is recommended.    01/10/2018 Initial Biopsy    Diagnsis from Breast Biopsy 01/10/18  Breast, right, needle core biopsy, 6 o'clock - INVASIVE DUCTAL CARCINOMA WITH CALCIFICATIONS, SEE COMMENT. - DUCTAL CARCINOMA IN SITU. 1 of 2 FINAL for VERDENE, CRESON [phone]) Microscopic Comment The carcinoma appears grade 2. Prognostic markers will be ordered. Dr. [name] has reviewed the case. The case was called to [facility] on 01/11/2018.    01/10/2018 Receptors her2    The tumor cells are NEGATIVE for Her2 (1+). Estrogen Receptor: 100%, POSITIVE, STRONG STAINING INTENSITY Progesterone Receptor: 100%, POSITIVE, STRONG STAINING INTENSITY Proliferation Marker Ki67: 15%    01/10/2018 Cancer Staging    Staging form: Breast, AJCC 8th Edition - Clinical stage from 01/10/2018: Stage IA (cT1b, cN0, cM0, G2, ER+, PR+, HER2-) - Signed by [name], MD on 02/03/2018    01/30/2018 Initial Diagnosis    Breast cancer ([name])      CURRENT THERAPY:  weekly carboplatin and taxol with concurrent radiation starting on 02/27/2018  INTERVAL HISTORY:  Kristin Williams is here for a follow up and treatment. She presents to the clinic today with her husband  and daughter. She notes after antibiotics her cough has much improved with less phlegm production and she overall feels better. Her peripheral edema is still significant. Her BP is 122/56 today. She is currently on 21m daily since 1/10.  She notes only having pain with swallowing. The sucralfate and Magic Mouth wash has not been working as much. She also take liquid tylenol. All only give moderate relief.     REVIEW OF SYSTEMS:   Constitutional: Denies fevers, chills or abnormal weight loss Eyes: Denies blurriness of vision Ears, nose, mouth, throat, and face: Denies mucositis or sore throat (+) Dysphagia Respiratory: Denies cough, dyspnea or wheezes Cardiovascular: Denies palpitation, chest discomfort (+) moderate b/l peripheral edema Gastrointestinal:  Denies nausea, heartburn or change in bowel habits Skin: Denies abnormal skin rashes Lymphatics: Denies new lymphadenopathy or easy bruising Neurological:Denies numbness, tingling or new weaknesses Behavioral/Psych: Mood is stable, no new changes  All other systems were reviewed with the patient and are negative.  MEDICAL HISTORY:  Past Medical History:  Diagnosis Date  . Arthritis   . Cancer (HCC)    Cervical  . Cancer ([facility]    Breast  . Cancer ([facility]    Vaginal  . Complication of anesthesia   . COPD (chronic obstructive pulmonary disease) ([facility]   . Headache(784.0)   . Hyperlipidemia   . Leg pain   . Peripheral vascular disease ([facility]   . Personal history of chemotherapy   .  Personal history of radiation therapy   . PONV (postoperative nausea and vomiting)     SURGICAL HISTORY: Past Surgical History:  Procedure Laterality Date  . ABDOMINAL HYSTERECTOMY  1975  . BREAST LUMPECTOMY  2005  . GANGLION CYST EXCISION  1977  . ILIAC ARTERY STENT  04/20/2004   CSD right iliac occlusive disease  . IR IMAGING GUIDED PORT INSERTION  02/22/2018    I have reviewed the social history and family history with the patient and they are  unchanged from previous note.  ALLERGIES:  is allergic to flonase [fluticasone propionate].  MEDICATIONS:  Current Outpatient Medications  Medication Sig Dispense Refill  . acetaminophen (TYLENOL) 160 MG/5ML liquid Take 325 mg by mouth every 4 (four) hours as needed for fever.    Marland Kitchen amoxicillin-clavulanate (AUGMENTIN) 875-125 MG tablet Take 1 tablet by mouth 2 (two) times daily. 10 tablet 0  . dexamethasone (DECADRON) 4 MG tablet Take 1 tablet (4 mg total) by mouth daily. Take 4 mg daily for 2 to 3 days after chemotherapy treatment 30 tablet 1  . enoxaparin (LOVENOX) 150 MG/ML injection Inject 0.93 mLs (140 mg total) into the skin daily for 2 days. 12/21 and 12/22 2 Syringe 0  . furosemide (LASIX) 20 MG tablet Take 1 tablet (20 mg total) by mouth every other day. 15 tablet 0  . HYDROcodone-acetaminophen (HYCET) 7.5-325 mg/15 ml solution Take 7.5 mLs by mouth every 8 (eight) hours as needed for moderate pain. 120 mL 0  . HYDROcodone-homatropine (HYCODAN) 5-1.5 MG/5ML syrup Take 5 mLs by mouth every 6 (six) hours as needed for cough. 120 mL 0  . lidocaine (XYLOCAINE) 2 % solution Patient: Mix 1part 2% viscous lidocaine, 1part H20. Swallow 63m of diluted mixture, 239m before meals and at bedtime, up to QID 100 mL 5  . lidocaine-prilocaine (EMLA) cream Apply 1 application topically as needed. 30 g 1  . LORazepam (ATIVAN) 1 MG tablet Take 1 tablet (1 mg total) by mouth 3 (three) times daily as needed for anxiety. 15 tablet 0  . metoCLOPramide (REGLAN) 10 MG tablet Take 1 tablet (10 mg total) by mouth every 6 (six) hours as needed for nausea. 60 tablet 2  . omeprazole (PRILOSEC) 20 MG capsule Take 40 mg by mouth daily.     . ondansetron (ZOFRAN) 8 MG tablet Take 1 tablet (8 mg total) by mouth every 8 (eight) hours as needed for nausea or vomiting. 20 tablet 0  . potassium chloride (KLOR-CON) 20 MEQ packet Take 20 mEq by mouth daily. 30 packet 1  . prochlorperazine (COMPAZINE) 10 MG tablet Take 1  tablet (10 mg total) by mouth every 6 (six) hours as needed for nausea or vomiting. 30 tablet 0  . sucralfate (CARAFATE) 1 g tablet Dissolve 1 tablet in 10 mL H20 and swallow up to QID to soothe throat. 40 tablet 5  . warfarin (COUMADIN) 2.5 MG tablet Take 2 tablets once daily with supper and then as directed by MD 90 tablet 2   No current facility-administered medications for this visit.    Facility-Administered Medications Ordered in Other Visits  Medication Dose Route Frequency Provider Last Rate Last Dose  . CARBOplatin (PARAPLATIN) 210 mg in sodium chloride 0.9 % 250 mL chemo infusion  210 mg Intravenous Once FeTruitt MerleMD 542 mL/hr at 03/27/18 1648 210 mg at 03/27/18 1648  . heparin lock flush 100 unit/mL  500 Units Intracatheter Once PRN FeTruitt MerleMD      . sodium chloride flush (  NS) 0.9 % injection 10 mL  10 mL Intracatheter PRN [name], MD      . sodium chloride flush (NS) 0.9 % injection 10 mL  10 mL Intracatheter Once [name], MD        PHYSICAL EXAMINATION: ECOG PERFORMANCE STATUS: 2 - Symptomatic, <50% confined to bed Blood pressure 122/56, heart rate 91, respiratory 16, temperature 36.8, pulse ox 99% on room air GENERAL:alert, no distress and comfortable SKIN: skin color, texture, turgor are normal, no rashes or significant lesions EYES: normal, Conjunctiva are pink and non-injected, sclera clear OROPHARYNX:no exudate, no erythema and lips, buccal mucosa, and tongue normal  NECK: supple, thyroid normal size, non-tender, without nodularity LYMPH:  no palpable lymphadenopathy in the cervical, axillary or inguinal LUNGS: clear to auscultation and percussion with normal breathing effort (+) Wheezes minimal HEART: regular rate & rhythm and no murmurs and no lower extremity edema ABDOMEN:abdomen soft, non-tender and normal bowel sounds Musculoskeletal:no cyanosis of digits and no clubbing  NEURO: alert & oriented x 3 with fluent speech, no focal motor/sensory  deficits  LABORATORY DATA:  I have reviewed the data as listed CBC Latest Ref Rng & Units 03/27/2018 03/20/2018 03/16/2018  WBC 4.0 - 10.5 K/uL 3.9(L) 2.7(L) 2.0(L)  Hemoglobin 12.0 - 15.0 g/dL 10.1(L) 10.5(L) 10.7(L)  Hematocrit 36.0 - 46.0 % 31.4(L) 32.5(L) 32.4(L)  Platelets 150 - 400 K/uL 167 155 173     CMP Latest Ref Rng & Units 03/27/2018 03/20/2018 03/16/2018  Glucose 70 - 99 mg/dL 109(H) 148(H) 88  BUN 8 - 23 mg/dL 6(L) 10 13  Creatinine 0.44 - 1.00 mg/dL 0.62 0.69 0.64  Sodium 135 - 145 mmol/L 140 137 137  Potassium 3.5 - 5.1 mmol/L 2.9(LL) 3.8 4.0  Chloride 98 - 111 mmol/L 105 102 102  CO2 22 - 32 mmol/L '28 26 28  ' Calcium 8.9 - 10.3 mg/dL 8.4(L) 9.0 8.7(L)  Total Protein 6.5 - 8.1 g/dL 5.6(L) 5.7(L) 5.8(L)  Total Bilirubin 0.3 - 1.2 mg/dL 0.5 0.5 0.5  Alkaline Phos 38 - 126 U/L 122 118 84  AST 15 - 41 U/L 13(L) 13(L) 14(L)  ALT 0 - 44 U/L '17 24 27   ' INR 2.53 today   RADIOGRAPHIC STUDIES: I have personally reviewed the radiological images as listed and agreed with the findings in the report. No results found.   ASSESSMENT & PLAN:  Kristin Williams is a 73 y.o. female with   1. EsophagealAdenocarcinoma,with oligo met incerebellum, stage IV  -She was recently diagnosed with esophogeal cancer,with oligometastasisto the brain. Given her stage IV disease her cancer is not curable but still treatable. Goal of care it to control her disease.  -She completedSBRTto her brain meton11/27/2019.  -I personally reviewed her PET from 02/16/18 and discussed with pt and her family, it showed lung nodule she had since 2017 and has been getting smaller, likely benign. She has a benign cysts in her kidney. There is a mildly hypermetabolic right supraclavicular LN. No other evidence of distant metastasis. Her primary esophageal mass is very long and hot on PET, along with a hypermetabolic right upper paratracheal node.  -She startedconcurrentchemo RTon 02/27/2018 with weekly  carboplatin and taxol for better local disease control. -if she can tolerate, I will consider FOLFOX 3 months as consolidation after chemoRT -Her cough has much improved with antibiotics, continue until dose completed. Chemo held last week, she is feeling much better now  -Labs reviewed and adequate to proceed with Carbo/taxol today.  -F/u in 1  week.for last dose carbo/taxol   2. RightBreastInvasive Ductal Carcinoma,cT1bN0M0 stage IA,Grade II, ER/PR Positive, HER 2 negative -Diagnosed through screening mammogram, same time as her esophageal cancer in October 2019.  -Given her early stage breast cancer, I previously discussedher breast cancer treatment is less urgent than esophogeal cancertreatment. -I plan tostart her onanti-estrogen therapyafter she completes chemoradiation for esophageal cancer, given her ER/PR positive cancer.If shedoes very well in 6 to 12 monthswithoutesophageal cancer without progression,we mayconsider breast surgery.   3.Oligo brain mets -This is likely from her primaryesophogealcancer.  -She does have balance issues and ambulates with assistance. She knows she is at risk for fall and that she should be careful. -She is s/p SBRT. Has mild slurred speech, right sided weaknessand balance stabilitycontinues toimprove -Will monitor with brain scan every 3 months.  4. H/o Left breast cancer, ER/PR negative, treated by Dr. Jana Hakim with lumpectomy, adj, chemo and radiation. -She was previously referred to genetics  -no evidence of recurrence   5. Dysphagia, odynophagia, nausea, low appetite  -secondary toher esophageal cancer and chemoradiation. -She does not like ensure or boost. She will try carnation breakfast and juicing her food. -She does not tolerate Compazine. She has Zofran but Reglan gives her the best relief. Nausea is currentlycontrolled and stable. -She completed Dexa taper dose 03/10/18. OK to usedexa 18m daily for 3-5  days after chemo -I think she did not need a feeding tube for now. She understands if her dysphagia and odynophagia are worsened by chemo and radiation, then she may need a feeding tube. -Odynophagia only moderately relieved with Lidocaine, Sulcrafate and magic mouthwash. I will call in liquid hydrocodone to use only as needed today (03/27/2018)  6.Acute deep vein thrombosis (DVT) of popliteal vein of left lower extremity, diagnosed on 02/28/2018  -She was found to have left lower extremity edema on his first day of chemoradiation, Doppler showed DVT -Due to the risk of GI bleeding and intracranial hemorrhage from brain metastasis, will start her on Coumadin with Lovenox bridging,doseadjust as needed. -She has loose tooth and would like to get this removed by dentist if she has to. I instructed her to tell her dentist she is on Coumadin before proceeding. -INR at 2.5 today (03/27/2018). Will continue Coumadin at 2.5 mg on Mondays and Fridays and 570mthe rest of the week.   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  -Stable   8.Goal of care discussion  -she is full code now  9. COPD -She tried hycodan will mild relief.   -Chest Xray negative last week, she completed antibiotics which greatly improved her cough.   10. Peripheral swelling  -She has developed bilateral edema on her legs and hands -I strongly encouraged her to elevate her legs at home  -I will prescribe her Lasix to use every other day starting in 3 days.   11. Hypokalemia -Potassium at 2.9 today (03/27/2018) -I will give IV potassium 2050mtoday and prescribed oral potassium to take 20 mEq daily.    PLAN: -I prescribed liquid hydrocodone and lasix and oral potassium today  -Labs reviewed and adequate to proceed with chemo treatment today  -Continue Coumadin at 2.5mg37mnday and Friday and  5mg 14mly the rest of the week.  -Lab, flush, f/u and treatment in 1 week.   No problem-specific Assessment & Plan notes found for this encounter.   No orders of the defined types were  placed in this encounter.  All questions were answered. The patient knows to call the clinic with any problems, questions or concerns. No barriers to learning was detected. I spent 20 minutes counseling the patient face to face. The total time spent in the appointment was 25 minutes and more than 50% was on counseling and review of test results     [name], MD 03/27/2018   I, Joslyn Devon, am acting as scribe for [name], MD.   I have reviewed the above documentation for accuracy and completeness, and I agree with the above.

## 2018-03-26 ENCOUNTER — Other Ambulatory Visit: Payer: Self-pay | Admitting: Hematology

## 2018-03-26 DIAGNOSIS — I824Z2 Acute embolism and thrombosis of unspecified deep veins of left distal lower extremity: Secondary | ICD-10-CM

## 2018-03-27 ENCOUNTER — Ambulatory Visit
Admission: RE | Admit: 2018-03-27 | Discharge: 2018-03-27 | Disposition: A | Discharge status: Home / Self Care | Payer: Medicare Other | Source: Ambulatory Visit | Attending: Radiation Oncology | Admitting: Radiation Oncology

## 2018-03-27 ENCOUNTER — Inpatient Hospital Stay: Payer: Medicare Other

## 2018-03-27 ENCOUNTER — Inpatient Hospital Stay (HOSPITAL_BASED_OUTPATIENT_CLINIC_OR_DEPARTMENT_OTHER): Payer: Medicare Other | Admitting: Hematology

## 2018-03-27 ENCOUNTER — Inpatient Hospital Stay: Payer: Medicare Other | Admitting: Nutrition

## 2018-03-27 ENCOUNTER — Encounter: Payer: Self-pay | Admitting: Hematology

## 2018-03-27 VITALS — BP 122/56 | HR 91 | Temp 98.3°F | Resp 16

## 2018-03-27 DIAGNOSIS — E876 Hypokalemia: Secondary | ICD-10-CM | POA: Diagnosis not present

## 2018-03-27 DIAGNOSIS — I824Z2 Acute embolism and thrombosis of unspecified deep veins of left distal lower extremity: Secondary | ICD-10-CM

## 2018-03-27 DIAGNOSIS — Z5111 Encounter for antineoplastic chemotherapy: Secondary | ICD-10-CM | POA: Diagnosis not present

## 2018-03-27 DIAGNOSIS — C159 Malignant neoplasm of esophagus, unspecified: Secondary | ICD-10-CM

## 2018-03-27 DIAGNOSIS — R609 Edema, unspecified: Secondary | ICD-10-CM | POA: Diagnosis not present

## 2018-03-27 DIAGNOSIS — Z170 Estrogen receptor positive status [ER+]: Secondary | ICD-10-CM

## 2018-03-27 DIAGNOSIS — J449 Chronic obstructive pulmonary disease, unspecified: Secondary | ICD-10-CM | POA: Diagnosis not present

## 2018-03-27 DIAGNOSIS — C7931 Secondary malignant neoplasm of brain: Secondary | ICD-10-CM | POA: Diagnosis not present

## 2018-03-27 DIAGNOSIS — Z7189 Other specified counseling: Secondary | ICD-10-CM

## 2018-03-27 DIAGNOSIS — R1310 Dysphagia, unspecified: Secondary | ICD-10-CM

## 2018-03-27 DIAGNOSIS — J209 Acute bronchitis, unspecified: Secondary | ICD-10-CM | POA: Diagnosis not present

## 2018-03-27 DIAGNOSIS — Z853 Personal history of malignant neoplasm of breast: Secondary | ICD-10-CM

## 2018-03-27 DIAGNOSIS — I82432 Acute embolism and thrombosis of left popliteal vein: Secondary | ICD-10-CM

## 2018-03-27 DIAGNOSIS — C50911 Malignant neoplasm of unspecified site of right female breast: Secondary | ICD-10-CM | POA: Diagnosis not present

## 2018-03-27 DIAGNOSIS — G629 Polyneuropathy, unspecified: Secondary | ICD-10-CM | POA: Diagnosis not present

## 2018-03-27 DIAGNOSIS — C154 Malignant neoplasm of middle third of esophagus: Secondary | ICD-10-CM | POA: Diagnosis not present

## 2018-03-27 DIAGNOSIS — Z510 Encounter for antineoplastic radiation therapy: Secondary | ICD-10-CM | POA: Diagnosis not present

## 2018-03-27 DIAGNOSIS — Z95828 Presence of other vascular implants and grafts: Secondary | ICD-10-CM | POA: Insufficient documentation

## 2018-03-27 LAB — CBC WITH DIFFERENTIAL (CANCER CENTER ONLY)
Abs Immature Granulocytes: 0.08 10*3/uL — ABNORMAL HIGH (ref 0.00–0.07)
Basophils Absolute: 0 10*3/uL (ref 0.0–0.1)
Basophils Relative: 1 %
EOS PCT: 1 %
Eosinophils Absolute: 0 10*3/uL (ref 0.0–0.5)
HCT: 31.4 % — ABNORMAL LOW (ref 36.0–46.0)
Hemoglobin: 10.1 g/dL — ABNORMAL LOW (ref 12.0–15.0)
Immature Granulocytes: 2 %
Lymphocytes Relative: 9 %
Lymphs Abs: 0.3 10*3/uL — ABNORMAL LOW (ref 0.7–4.0)
MCH: 30.1 pg (ref 26.0–34.0)
MCHC: 32.2 g/dL (ref 30.0–36.0)
MCV: 93.7 fL (ref 80.0–100.0)
Monocytes Absolute: 0.4 10*3/uL (ref 0.1–1.0)
Monocytes Relative: 9 %
Neutro Abs: 3.1 10*3/uL (ref 1.7–7.7)
Neutrophils Relative %: 78 %
Platelet Count: 167 10*3/uL (ref 150–400)
RBC: 3.35 MIL/uL — ABNORMAL LOW (ref 3.87–5.11)
RDW: 16.1 % — ABNORMAL HIGH (ref 11.5–15.5)
WBC: 3.9 10*3/uL — AB (ref 4.0–10.5)
nRBC: 0.5 % — ABNORMAL HIGH (ref 0.0–0.2)

## 2018-03-27 LAB — PROTIME-INR
INR: 2.53
Prothrombin Time: 26.9 seconds — ABNORMAL HIGH (ref 11.4–15.2)

## 2018-03-27 LAB — CMP (CANCER CENTER ONLY)
ALT: 17 U/L (ref 0–44)
AST: 13 U/L — ABNORMAL LOW (ref 15–41)
Albumin: 2.4 g/dL — ABNORMAL LOW (ref 3.5–5.0)
Alkaline Phosphatase: 122 U/L (ref 38–126)
Anion gap: 7 (ref 5–15)
BUN: 6 mg/dL — ABNORMAL LOW (ref 8–23)
CO2: 28 mmol/L (ref 22–32)
Calcium: 8.4 mg/dL — ABNORMAL LOW (ref 8.9–10.3)
Chloride: 105 mmol/L (ref 98–111)
Creatinine: 0.62 mg/dL (ref 0.44–1.00)
GFR, Estimated: >60 mL/min (ref >60)
Glucose, Bld: 109 mg/dL — ABNORMAL HIGH (ref 70–99)
Potassium: 2.9 mmol/L — CL (ref 3.5–5.1)
Sodium: 140 mmol/L (ref 135–145)
Total Bilirubin: 0.5 mg/dL (ref 0.3–1.2)
Total Protein: 5.6 g/dL — ABNORMAL LOW (ref 6.5–8.1)

## 2018-03-27 MED ORDER — POTASSIUM CHLORIDE 10 MEQ/100ML IV SOLN
10.0000 meq | INTRAVENOUS | Status: AC
  Administered 2018-03-27 (×2): 10 meq via INTRAVENOUS
  Filled 2018-03-27: qty 100

## 2018-03-27 MED ORDER — FUROSEMIDE 20 MG PO TABS: 20.0000 mg | ORAL_TABLET | ORAL | 0 refills | Status: DC

## 2018-03-27 MED ORDER — HEPARIN SOD (PORK) LOCK FLUSH 100 UNIT/ML IV SOLN
500.0000 [IU] | Freq: Once | INTRAVENOUS | Status: AC | PRN
  Administered 2018-03-27: 500 [IU]
  Filled 2018-03-27: qty 5

## 2018-03-27 MED ORDER — DIPHENHYDRAMINE HCL 50 MG/ML IJ SOLN
50.0000 mg | Freq: Once | INTRAMUSCULAR | Status: AC
  Administered 2018-03-27: 50 mg via INTRAVENOUS

## 2018-03-27 MED ORDER — SODIUM CHLORIDE 0.9 % IV SOLN
50.0000 mg/m2 | Freq: Once | INTRAVENOUS | Status: AC
  Administered 2018-03-27: 102 mg via INTRAVENOUS
  Filled 2018-03-27: qty 17

## 2018-03-27 MED ORDER — HYDROCODONE-ACETAMINOPHEN 7.5-325 MG/15ML PO SOLN: 7.5000 mL | Freq: Three times a day (TID) | ORAL | 0 refills | Status: DC | PRN

## 2018-03-27 MED ORDER — SODIUM CHLORIDE 0.9 % IV SOLN
Freq: Once | INTRAVENOUS | Status: AC
  Administered 2018-03-27: 13:00:00 via INTRAVENOUS
  Filled 2018-03-27: qty 250

## 2018-03-27 MED ORDER — PALONOSETRON HCL INJECTION 0.25 MG/5ML
0.2500 mg | Freq: Once | INTRAVENOUS | Status: AC
  Administered 2018-03-27: 0.25 mg via INTRAVENOUS

## 2018-03-27 MED ORDER — PALONOSETRON HCL INJECTION 0.25 MG/5ML
INTRAVENOUS | Status: AC
  Filled 2018-03-27: qty 5

## 2018-03-27 MED ORDER — SODIUM CHLORIDE 0.9 % IV SOLN
20.0000 mg | Freq: Once | INTRAVENOUS | Status: AC
  Administered 2018-03-27: 20 mg via INTRAVENOUS
  Filled 2018-03-27: qty 2

## 2018-03-27 MED ORDER — SODIUM CHLORIDE 0.9% FLUSH
10.0000 mL | INTRAVENOUS | Status: DC | PRN
  Filled 2018-03-27: qty 10

## 2018-03-27 MED ORDER — POTASSIUM CHLORIDE 20 MEQ PO PACK: 20.0000 meq | PACK | Freq: Every day | ORAL | 1 refills | Status: DC

## 2018-03-27 MED ORDER — FAMOTIDINE IN NACL 20-0.9 MG/50ML-% IV SOLN
INTRAVENOUS | Status: AC
  Filled 2018-03-27: qty 50

## 2018-03-27 MED ORDER — FAMOTIDINE IN NACL 20-0.9 MG/50ML-% IV SOLN
20.0000 mg | Freq: Once | INTRAVENOUS | Status: AC
  Administered 2018-03-27: 20 mg via INTRAVENOUS

## 2018-03-27 MED ORDER — DIPHENHYDRAMINE HCL 50 MG/ML IJ SOLN
INTRAMUSCULAR | Status: AC
  Filled 2018-03-27: qty 1

## 2018-03-27 MED ORDER — SODIUM CHLORIDE 0.9 % IV SOLN
209.0000 mg | Freq: Once | INTRAVENOUS | Status: AC
  Administered 2018-03-27: 210 mg via INTRAVENOUS
  Filled 2018-03-27: qty 21

## 2018-03-27 MED ORDER — SODIUM CHLORIDE 0.9% FLUSH
10.0000 mL | Freq: Once | INTRAVENOUS | Status: AC
  Administered 2018-03-27: 10 mL
  Filled 2018-03-27: qty 10

## 2018-03-27 NOTE — Progress Notes (Signed)
Nutrition follow-up completed with patient and family in the infusion room for patient diagnosed with esophageal adenocarcinoma and right breast invasive ductal carcinoma. Patient's weight remains at 211 pounds. Patient has notable lower leg edema.  Patient reports physician will prescribe Lasix. Also noted decreased potassium at 2.9. Patient complains of very early satiety and states it could be hours between meals. She has many food dislikes right now but continues to try to eat what she can. Her family is supportive.  Nutrition diagnosis: Unintentional weight loss continues.  Intervention: I educated patient to try to increase high-calorie high-protein foods every 3 hours. Explained importance of increased protein to help with fluid retention. I explained patient was likely losing lean muscle mass. Recommended patient try to eat bites every 2-3 hours. Reviewed importance of food textures to ease swallowing. Reviewed high potassium foods and provided fact sheets. Teach back method used.  Monitoring, evaluation, goals: Patient will tolerate increased calories and protein to minimize loss of lean body mass.  Next visit: Monday, January 20 during infusion.  **Disclaimer: This note was dictated with voice recognition software. Similar sounding words can inadvertently be transcribed and this note may contain transcription errors which may not have been corrected upon publication of note.**

## 2018-03-28 ENCOUNTER — Telehealth: Payer: Self-pay

## 2018-03-28 ENCOUNTER — Other Ambulatory Visit: Payer: Self-pay

## 2018-03-28 ENCOUNTER — Ambulatory Visit
Admission: RE | Admit: 2018-03-28 | Discharge: 2018-03-28 | Disposition: A | Discharge status: Home / Self Care | Payer: Medicare Other | Source: Ambulatory Visit | Attending: Radiation Oncology | Admitting: Radiation Oncology

## 2018-03-28 DIAGNOSIS — C159 Malignant neoplasm of esophagus, unspecified: Secondary | ICD-10-CM

## 2018-03-28 DIAGNOSIS — Z510 Encounter for antineoplastic radiation therapy: Secondary | ICD-10-CM | POA: Diagnosis not present

## 2018-03-28 DIAGNOSIS — C154 Malignant neoplasm of middle third of esophagus: Secondary | ICD-10-CM | POA: Diagnosis not present

## 2018-03-28 DIAGNOSIS — C7931 Secondary malignant neoplasm of brain: Secondary | ICD-10-CM | POA: Diagnosis not present

## 2018-03-28 DIAGNOSIS — Z853 Personal history of malignant neoplasm of breast: Secondary | ICD-10-CM | POA: Diagnosis not present

## 2018-03-28 MED ORDER — METOCLOPRAMIDE HCL 10 MG PO TABS: 10.0000 mg | ORAL_TABLET | Freq: Four times a day (QID) | ORAL | 2 refills | Status: DC | PRN

## 2018-03-28 MED ORDER — POTASSIUM CHLORIDE CRYS ER 20 MEQ PO TBCR: 20.0000 meq | EXTENDED_RELEASE_TABLET | Freq: Every day | ORAL | 2 refills | Status: DC

## 2018-03-28 NOTE — Progress Notes (Signed)
PA for Klor-Con has been submitted.

## 2018-03-28 NOTE — Telephone Encounter (Signed)
Patient called stating insurance will not approve the potassium powder, requested pill form be sent into her pharmacy along with refill on Reglan.  This was done.

## 2018-03-29 ENCOUNTER — Ambulatory Visit
Admission: RE | Admit: 2018-03-29 | Discharge: 2018-03-29 | Disposition: A | Discharge status: Home / Self Care | Payer: Medicare Other | Source: Ambulatory Visit | Attending: Radiation Oncology | Admitting: Radiation Oncology

## 2018-03-29 DIAGNOSIS — Z510 Encounter for antineoplastic radiation therapy: Secondary | ICD-10-CM | POA: Diagnosis not present

## 2018-03-29 DIAGNOSIS — C159 Malignant neoplasm of esophagus, unspecified: Secondary | ICD-10-CM | POA: Diagnosis not present

## 2018-03-29 DIAGNOSIS — C7931 Secondary malignant neoplasm of brain: Secondary | ICD-10-CM | POA: Diagnosis not present

## 2018-03-29 DIAGNOSIS — C154 Malignant neoplasm of middle third of esophagus: Secondary | ICD-10-CM | POA: Diagnosis not present

## 2018-03-29 DIAGNOSIS — Z853 Personal history of malignant neoplasm of breast: Secondary | ICD-10-CM | POA: Diagnosis not present

## 2018-03-30 ENCOUNTER — Ambulatory Visit
Admission: RE | Admit: 2018-03-30 | Discharge: 2018-03-30 | Disposition: A | Discharge status: Home / Self Care | Payer: Medicare Other | Source: Ambulatory Visit | Attending: Radiation Oncology | Admitting: Radiation Oncology

## 2018-03-30 DIAGNOSIS — C7931 Secondary malignant neoplasm of brain: Secondary | ICD-10-CM | POA: Diagnosis not present

## 2018-03-30 DIAGNOSIS — Z853 Personal history of malignant neoplasm of breast: Secondary | ICD-10-CM | POA: Diagnosis not present

## 2018-03-30 DIAGNOSIS — C154 Malignant neoplasm of middle third of esophagus: Secondary | ICD-10-CM | POA: Diagnosis not present

## 2018-03-30 DIAGNOSIS — C159 Malignant neoplasm of esophagus, unspecified: Secondary | ICD-10-CM | POA: Diagnosis not present

## 2018-03-30 DIAGNOSIS — Z510 Encounter for antineoplastic radiation therapy: Secondary | ICD-10-CM | POA: Diagnosis not present

## 2018-03-31 ENCOUNTER — Other Ambulatory Visit: Payer: Self-pay | Admitting: *Deleted

## 2018-03-31 ENCOUNTER — Ambulatory Visit
Admission: RE | Admit: 2018-03-31 | Discharge: 2018-03-31 | Disposition: A | Discharge status: Home / Self Care | Payer: Medicare Other | Source: Ambulatory Visit | Attending: Radiation Oncology | Admitting: Radiation Oncology

## 2018-03-31 DIAGNOSIS — Z510 Encounter for antineoplastic radiation therapy: Secondary | ICD-10-CM | POA: Diagnosis not present

## 2018-03-31 DIAGNOSIS — C154 Malignant neoplasm of middle third of esophagus: Secondary | ICD-10-CM | POA: Diagnosis not present

## 2018-03-31 DIAGNOSIS — C159 Malignant neoplasm of esophagus, unspecified: Secondary | ICD-10-CM | POA: Diagnosis not present

## 2018-03-31 DIAGNOSIS — C7931 Secondary malignant neoplasm of brain: Secondary | ICD-10-CM | POA: Diagnosis not present

## 2018-03-31 DIAGNOSIS — Z853 Personal history of malignant neoplasm of breast: Secondary | ICD-10-CM | POA: Diagnosis not present

## 2018-03-31 DIAGNOSIS — D4989 Neoplasm of unspecified behavior of other specified sites: Secondary | ICD-10-CM

## 2018-04-03 ENCOUNTER — Telehealth: Payer: Self-pay | Admitting: Hematology

## 2018-04-03 ENCOUNTER — Inpatient Hospital Stay (HOSPITAL_BASED_OUTPATIENT_CLINIC_OR_DEPARTMENT_OTHER): Payer: Medicare Other | Admitting: Hematology

## 2018-04-03 ENCOUNTER — Inpatient Hospital Stay: Payer: Medicare Other | Admitting: Nutrition

## 2018-04-03 ENCOUNTER — Inpatient Hospital Stay: Payer: Medicare Other

## 2018-04-03 ENCOUNTER — Ambulatory Visit
Admission: RE | Admit: 2018-04-03 | Discharge: 2018-04-03 | Disposition: A | Discharge status: Home / Self Care | Payer: Medicare Other | Source: Ambulatory Visit | Attending: Radiation Oncology | Admitting: Radiation Oncology

## 2018-04-03 ENCOUNTER — Encounter: Payer: Self-pay | Admitting: Hematology

## 2018-04-03 VITALS — BP 108/61 | HR 107 | Temp 98.2°F | Resp 18 | Ht 60.0 in | Wt 202.3 lb

## 2018-04-03 DIAGNOSIS — Z510 Encounter for antineoplastic radiation therapy: Secondary | ICD-10-CM | POA: Diagnosis not present

## 2018-04-03 DIAGNOSIS — R110 Nausea: Secondary | ICD-10-CM

## 2018-04-03 DIAGNOSIS — J449 Chronic obstructive pulmonary disease, unspecified: Secondary | ICD-10-CM

## 2018-04-03 DIAGNOSIS — C159 Malignant neoplasm of esophagus, unspecified: Secondary | ICD-10-CM

## 2018-04-03 DIAGNOSIS — Z5111 Encounter for antineoplastic chemotherapy: Secondary | ICD-10-CM | POA: Diagnosis not present

## 2018-04-03 DIAGNOSIS — C50911 Malignant neoplasm of unspecified site of right female breast: Secondary | ICD-10-CM

## 2018-04-03 DIAGNOSIS — I82432 Acute embolism and thrombosis of left popliteal vein: Secondary | ICD-10-CM | POA: Diagnosis not present

## 2018-04-03 DIAGNOSIS — J209 Acute bronchitis, unspecified: Secondary | ICD-10-CM | POA: Diagnosis not present

## 2018-04-03 DIAGNOSIS — Z853 Personal history of malignant neoplasm of breast: Secondary | ICD-10-CM | POA: Diagnosis not present

## 2018-04-03 DIAGNOSIS — I771 Stricture of artery: Secondary | ICD-10-CM

## 2018-04-03 DIAGNOSIS — R609 Edema, unspecified: Secondary | ICD-10-CM

## 2018-04-03 DIAGNOSIS — E876 Hypokalemia: Secondary | ICD-10-CM | POA: Diagnosis not present

## 2018-04-03 DIAGNOSIS — C7931 Secondary malignant neoplasm of brain: Secondary | ICD-10-CM

## 2018-04-03 DIAGNOSIS — I614 Nontraumatic intracerebral hemorrhage in cerebellum: Secondary | ICD-10-CM

## 2018-04-03 DIAGNOSIS — C154 Malignant neoplasm of middle third of esophagus: Secondary | ICD-10-CM | POA: Diagnosis not present

## 2018-04-03 DIAGNOSIS — Z7189 Other specified counseling: Secondary | ICD-10-CM | POA: Diagnosis not present

## 2018-04-03 DIAGNOSIS — I824Z2 Acute embolism and thrombosis of unspecified deep veins of left distal lower extremity: Secondary | ICD-10-CM

## 2018-04-03 LAB — CBC WITH DIFFERENTIAL (CANCER CENTER ONLY)
Abs Immature Granulocytes: 0.11 10*3/uL — ABNORMAL HIGH (ref 0.00–0.07)
Basophils Absolute: 0 10*3/uL (ref 0.0–0.1)
Basophils Relative: 1 %
Eosinophils Absolute: 0.1 10*3/uL (ref 0.0–0.5)
Eosinophils Relative: 2 %
HCT: 33.5 % — ABNORMAL LOW (ref 36.0–46.0)
Hemoglobin: 10.9 g/dL — ABNORMAL LOW (ref 12.0–15.0)
Immature Granulocytes: 3 %
Lymphocytes Relative: 7 %
Lymphs Abs: 0.3 10*3/uL — ABNORMAL LOW (ref 0.7–4.0)
MCH: 30.7 pg (ref 26.0–34.0)
MCHC: 32.5 g/dL (ref 30.0–36.0)
MCV: 94.4 fL (ref 80.0–100.0)
Monocytes Absolute: 0.5 10*3/uL (ref 0.1–1.0)
Monocytes Relative: 13 %
NEUTROS PCT: 74 %
Neutro Abs: 2.8 10*3/uL (ref 1.7–7.7)
Platelet Count: 213 10*3/uL (ref 150–400)
RBC: 3.55 MIL/uL — AB (ref 3.87–5.11)
RDW: 16.8 % — ABNORMAL HIGH (ref 11.5–15.5)
WBC Count: 3.7 10*3/uL — ABNORMAL LOW (ref 4.0–10.5)
nRBC: 2.2 % — ABNORMAL HIGH (ref 0.0–0.2)

## 2018-04-03 LAB — CMP (CANCER CENTER ONLY)
ALT: 17 U/L (ref 0–44)
ANION GAP: 8 (ref 5–15)
AST: 16 U/L (ref 15–41)
Albumin: 2.9 g/dL — ABNORMAL LOW (ref 3.5–5.0)
Alkaline Phosphatase: 132 U/L — ABNORMAL HIGH (ref 38–126)
BUN: 8 mg/dL (ref 8–23)
CO2: 26 mmol/L (ref 22–32)
Calcium: 9.6 mg/dL (ref 8.9–10.3)
Chloride: 103 mmol/L (ref 98–111)
Creatinine: 0.75 mg/dL (ref 0.44–1.00)
Glucose, Bld: 105 mg/dL — ABNORMAL HIGH (ref 70–99)
Potassium: 4.8 mmol/L (ref 3.5–5.1)
Sodium: 137 mmol/L (ref 135–145)
Total Bilirubin: 0.6 mg/dL (ref 0.3–1.2)
Total Protein: 6.5 g/dL (ref 6.5–8.1)

## 2018-04-03 LAB — PROTIME-INR
INR: 1.65
Prothrombin Time: 19.3 seconds — ABNORMAL HIGH (ref 11.4–15.2)

## 2018-04-03 MED ORDER — SODIUM CHLORIDE 0.9 % IV SOLN
209.0000 mg | Freq: Once | INTRAVENOUS | Status: AC
  Administered 2018-04-03: 210 mg via INTRAVENOUS
  Filled 2018-04-03: qty 21

## 2018-04-03 MED ORDER — SODIUM CHLORIDE 0.9% FLUSH
10.0000 mL | INTRAVENOUS | Status: DC | PRN
  Administered 2018-04-03: 10 mL
  Filled 2018-04-03: qty 10

## 2018-04-03 MED ORDER — FAMOTIDINE IN NACL 20-0.9 MG/50ML-% IV SOLN
20.0000 mg | Freq: Once | INTRAVENOUS | Status: AC
  Administered 2018-04-03: 20 mg via INTRAVENOUS

## 2018-04-03 MED ORDER — SODIUM CHLORIDE 0.9 % IV SOLN
Freq: Once | INTRAVENOUS | Status: AC
  Administered 2018-04-03: 11:00:00 via INTRAVENOUS
  Filled 2018-04-03: qty 250

## 2018-04-03 MED ORDER — PALONOSETRON HCL INJECTION 0.25 MG/5ML
0.2500 mg | Freq: Once | INTRAVENOUS | Status: AC
  Administered 2018-04-03: 0.25 mg via INTRAVENOUS

## 2018-04-03 MED ORDER — HEPARIN SOD (PORK) LOCK FLUSH 100 UNIT/ML IV SOLN
500.0000 [IU] | Freq: Once | INTRAVENOUS | Status: AC | PRN
  Administered 2018-04-03: 500 [IU]
  Filled 2018-04-03: qty 5

## 2018-04-03 MED ORDER — DIPHENHYDRAMINE HCL 50 MG/ML IJ SOLN
50.0000 mg | Freq: Once | INTRAMUSCULAR | Status: AC
  Administered 2018-04-03: 50 mg via INTRAVENOUS

## 2018-04-03 MED ORDER — SODIUM CHLORIDE 0.9 % IV SOLN
50.0000 mg/m2 | Freq: Once | INTRAVENOUS | Status: AC
  Administered 2018-04-03: 102 mg via INTRAVENOUS
  Filled 2018-04-03: qty 17

## 2018-04-03 MED ORDER — SODIUM CHLORIDE 0.9 % IV SOLN
20.0000 mg | Freq: Once | INTRAVENOUS | Status: AC
  Administered 2018-04-03: 20 mg via INTRAVENOUS
  Filled 2018-04-03: qty 2

## 2018-04-03 NOTE — Progress Notes (Signed)
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    01/30/2018 Initial Diagnosis    Metastasis to brain [facility])    01/31/2018 Imaging    MRI Brain W WO Contrast 01/31/18  IMPRESSION: 3 Tesla study does not disclose any additional lesions. Solitary metastasis in the right superior cerebellum measuring 2.9 x 2.6 x 3.0 cm with mild surrounding edema. 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    02/03/2018 - 02/08/2018 Radiation Therapy    SBRT with Dr. Isidore Moos on 02/03/18, 02/06/18, 02/08/18     Esophageal cancer, stage IV ([place])   01/08/2018 Imaging    CT AP W Constrast 01/08/18  IMPRESSION: 1. Nonobstructed, nondistended bowel. No acute inflammatory process. 2. Moderate-sized hiatal hernia noted. 3. Low-density 1.7 cm left adrenal nodule likely to represent a small adenoma. Lower pole left renal cyst measuring 1.8 cm. 4. Moderate aortic atherosclerosis. 5. Lumbar spondylosis and facet arthropathy.    01/27/2018 Imaging    CT Chest W Contrast 01/27/18  IMPRESSION: 1. Large mass involving the distal half of the esophagus spanning approximately 11.7 cm is identified compatible with primary esophageal  neoplasm. 2. Small posterior mediastinal and high right paratracheal lymph nodes are noted which may represent foci of metastatic adenopathy. 3. The small pulmonary nodules in the right lung are unchanged from 01/15/2016 and are favored to represent a benign process. 4. Aortic Atherosclerosis (ICD10-I70.0) and Emphysema (ICD10-J43.9). 5. Coronary artery atherosclerotic calcifications.    01/30/2018 Initial Diagnosis    Esophageal cancer, stage IV ([place])    02/03/2018 - 02/08/2018 Radiation Therapy    SBRT to oligo brain metastasis, in 3 fractions, under Dr. Isidore Moos     02/16/2018 PET scan    PET 02/16/18 IMPRESSION: 1. A 15.7 cm in length esophageal mass extends from the aortic arch level down to the distal esophagus, maximum SUV 22.4, compatible with malignancy. There is a small right lower neck level IV lymph node with lower than mediastinal blood pool activity, and a right upper paratracheal lymph node with just above mediastinal blood pool activity. 2. Faint speckled heterogeneity in the liver, for example in the lateral segment left hepatic lobe, is likely incidental/benign given the lack of correlate of CT finding. It might be prudent to obtain an MRI just to make sure that there is not a small early metastatic lesion in this vicinity. 3. Known right cerebellar vermian metastatic lesion, with expected hypo activity compared to the surrounding brain activity. 4. The right middle lobe pulmonary nodule measures 1.1 by 1.0 cm and is mildly hypermetabolic with maximum SUV 2.6. This lesion measured 6 by 5 mm back on 12/06/2003 and 1.2 by 1.1 cm on 10/15/2015. I am uncertain what to make of the very slow growth and mildly  accentuated metabolic activity. This could be a chronic granulomatous process. Low-grade adenocarcinoma of the lung seems unlikely to be so indolent as to only mildly increase over a 14 year period, but is presumably a differential diagnostic  consideration. Surveillance is suggested. 5. Other imaging findings of potential clinical significance: Aortic Atherosclerosis (ICD10-I70.0). Descending and sigmoid colon diverticulosis. Gas in the urinary bladder, most common cause would be recent catheterization. Minimal chronic left ethmoid sinusitis.    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 Ultrasound-guided biopsy is recommended.    01/10/2018 Initial Biopsy    Diagnsis from Breast Biopsy 01/10/18  Breast, right, needle core biopsy, 6 o'clock - INVASIVE DUCTAL CARCINOMA WITH CALCIFICATIONS, SEE COMMENT. - DUCTAL CARCINOMA IN SITU. 1 of 2 FINAL for [name], [name] [phone]) Microscopic Comment The carcinoma appears grade 2. Prognostic markers will be ordered. Dr. [name] has reviewed the case. The case was called to [employer] on 01/11/2018.    01/10/2018 Receptors her2    The tumor cells are NEGATIVE for Her2 (1+). Estrogen Receptor: 100%, POSITIVE, STRONG STAINING INTENSITY Progesterone Receptor: 100%, POSITIVE, STRONG STAINING INTENSITY Proliferation Marker Ki67: 15%    01/10/2018 Cancer Staging    Staging form: Breast, AJCC 8th Edition - Clinical stage from 01/10/2018: Stage IA (cT1b, cN0, cM0, G2, ER+, PR+, HER2-) - Signed by [name], MD on 02/03/2018    01/30/2018 Initial Diagnosis    Breast cancer ([place])      CURRENT THERAPY:  weekly carboplatin and taxolwith concurrent radiation12/16/2019-04/07/17  INTERVAL HISTORY:  Kristin Williams is here for a follow up of treatment. She presents to the clinic today with  her daughter. She notes since 2 days ago she started having nausea and dry heaving in the mornings. She took Reglan because Zofran and compazine does not work for her. This last for 5 minutes and eases off. She is still able to eat.  She notes her weight loss is from lasix removing her fluids and her swelling has reduced. She notes having blood mixed in her phlegm when she coughs.  She notes having a lose tooth and is afraid it will fall out soon. She notes she has been ambulating well with a walker. She has a brian MRI on 05/08/17.     REVIEW OF SYSTEMS:   Constitutional: Denies fevers, chills (+) Appetite improved with mild weight loss Eyes: Denies blurriness of vision Ears, nose, mouth, throat, and face: Denies mucositis or sore throat  (+) Loose tooth  Respiratory: Denies cough, dyspnea or wheezes (+) Cough with mildly bloody phlegm Cardiovascular: Denies palpitation, chest discomfort (+) Peripheral swelling much improved  Gastrointestinal:  Denies heartburn or change in bowel habits (+) Nausea in AM Skin: Denies abnormal skin rashes Lymphatics: Denies new lymphadenopathy or easy bruising Neurological:Denies numbness, tingling or new weaknesses Behavioral/Psych: Mood is stable, no new changes  All other systems were reviewed with the patient and are negative.  MEDICAL HISTORY:  Past Medical History:  Diagnosis Date  . Arthritis   . Cancer (HCC)    Cervical  . Cancer ([place])    Breast  . Cancer ([place])  Vaginal  . Complication of anesthesia   . COPD (chronic obstructive pulmonary disease) ([place])   . Headache(784.0)   . Hyperlipidemia   . Leg pain   . Peripheral vascular disease ([place])   . Personal history of chemotherapy   . Personal history of radiation therapy   . PONV (postoperative nausea and vomiting)     SURGICAL HISTORY: Past Surgical History:  Procedure Laterality Date  . ABDOMINAL HYSTERECTOMY  1975  . BREAST LUMPECTOMY  2005  . GANGLION CYST EXCISION  1977  .  ILIAC ARTERY STENT  04/20/2004   CSD right iliac occlusive disease  . IR IMAGING GUIDED PORT INSERTION  02/22/2018    I have reviewed the social history and family history with the patient and they are unchanged from previous note.  ALLERGIES:  is allergic to flonase [fluticasone propionate].  MEDICATIONS:  Current Outpatient Medications  Medication Sig Dispense Refill  . acetaminophen (TYLENOL) 160 MG/5ML liquid Take 325 mg by mouth every 4 (four) hours as needed for fever.    Marland Kitchen amoxicillin-clavulanate (AUGMENTIN) 875-125 MG tablet Take 1 tablet by mouth 2 (two) times daily. 10 tablet 0  . dexamethasone (DECADRON) 4 MG tablet Take 1 tablet (4 mg total) by mouth daily. Take 4 mg daily for 2 to 3 days after chemotherapy treatment 30 tablet 1  . furosemide (LASIX) 20 MG tablet Take 1 tablet (20 mg total) by mouth every other day. 15 tablet 0  . HYDROcodone-acetaminophen (HYCET) 7.5-325 mg/15 ml solution Take 7.5 mLs by mouth every 8 (eight) hours as needed for moderate pain. 120 mL 0  . HYDROcodone-homatropine (HYCODAN) 5-1.5 MG/5ML syrup Take 5 mLs by mouth every 6 (six) hours as needed for cough. 120 mL 0  . lidocaine (XYLOCAINE) 2 % solution Patient: Mix 1part 2% viscous lidocaine, 1part H20. Swallow 66m of diluted mixture, 261m before meals and at bedtime, up to QID 100 mL 5  . lidocaine-prilocaine (EMLA) cream Apply 1 application topically as needed. 30 g 1  . LORazepam (ATIVAN) 1 MG tablet Take 1 tablet (1 mg total) by mouth 3 (three) times daily as needed for anxiety. 15 tablet 0  . metoCLOPramide (REGLAN) 10 MG tablet Take 1 tablet (10 mg total) by mouth every 6 (six) hours as needed for nausea. 60 tablet 2  . omeprazole (PRILOSEC) 20 MG capsule Take 40 mg by mouth daily.     . ondansetron (ZOFRAN) 8 MG tablet Take 1 tablet (8 mg total) by mouth every 8 (eight) hours as needed for nausea or vomiting. 20 tablet 0  . potassium chloride (KLOR-CON) 20 MEQ packet Take 20 mEq by mouth  daily. 30 packet 1  . potassium chloride SA (K-DUR,KLOR-CON) 20 MEQ tablet Take 1 tablet (20 mEq total) by mouth daily. 30 tablet 2  . prochlorperazine (COMPAZINE) 10 MG tablet Take 1 tablet (10 mg total) by mouth every 6 (six) hours as needed for nausea or vomiting. 30 tablet 0  . sucralfate (CARAFATE) 1 g tablet Dissolve 1 tablet in 10 mL H20 and swallow up to QID to soothe throat. 40 tablet 5  . warfarin (COUMADIN) 2.5 MG tablet Take 2 tablets once daily with supper and then as directed by MD 90 tablet 2  . enoxaparin (LOVENOX) 150 MG/ML injection Inject 0.93 mLs (140 mg total) into the skin daily for 2 days. 12/21 and 12/22 2 Syringe 0   No current facility-administered medications for this visit.    Facility-Administered Medications Ordered in Other Visits  Medication  Dose Route Frequency Provider Last Rate Last Dose  . sodium chloride flush (NS) 0.9 % injection 10 mL  10 mL Intracatheter PRN [name], MD   10 mL at 04/03/18 1409    PHYSICAL EXAMINATION: ECOG PERFORMANCE STATUS: 2 - Symptomatic, <50% confined to bed  Vitals:   04/03/18 0903  BP: 108/61  Pulse: (!) 107  Resp: 18  Temp: 98.2 F (36.8 C)  SpO2: 100%   Filed Weights   04/03/18 0903  Weight: 202 lb 4.8 oz (91.8 kg)    GENERAL:alert, no distress and comfortable SKIN: skin color, texture, turgor are normal, no rashes or significant lesions EYES: normal, Conjunctiva are pink and non-injected, sclera clear OROPHARYNX:no exudate, no erythema and lips, buccal mucosa, and tongue normal  NECK: supple, thyroid normal size, non-tender, without nodularity LYMPH:  no palpable lymphadenopathy in the cervical, axillary or inguinal LUNGS: clear to auscultation and percussion (+) Mild wheezing  HEART: regular rate & rhythm and no murmurs and no lower extremity edema ABDOMEN:abdomen soft, non-tender and normal bowel sounds Musculoskeletal:no cyanosis of digits and no clubbing  NEURO: alert & oriented x 3 with fluent speech,  no focal motor/sensory deficits  LABORATORY DATA:  I have reviewed the data as listed CBC Latest Ref Rng & Units 04/03/2018 03/27/2018 03/20/2018  WBC 4.0 - 10.5 K/uL 3.7(L) 3.9(L) 2.7(L)  Hemoglobin 12.0 - 15.0 g/dL 10.9(L) 10.1(L) 10.5(L)  Hematocrit 36.0 - 46.0 % 33.5(L) 31.4(L) 32.5(L)  Platelets 150 - 400 K/uL 213 167 155     CMP Latest Ref Rng & Units 04/03/2018 03/27/2018 03/20/2018  Glucose 70 - 99 mg/dL 105(H) 109(H) 148(H)  BUN 8 - 23 mg/dL 8 6(L) 10  Creatinine 0.44 - 1.00 mg/dL 0.75 0.62 0.69  Sodium 135 - 145 mmol/L 137 140 137  Potassium 3.5 - 5.1 mmol/L 4.8 2.9(LL) 3.8  Chloride 98 - 111 mmol/L 103 105 102  CO2 22 - 32 mmol/L '26 28 26  ' Calcium 8.9 - 10.3 mg/dL 9.6 8.4(L) 9.0  Total Protein 6.5 - 8.1 g/dL 6.5 5.6(L) 5.7(L)  Total Bilirubin 0.3 - 1.2 mg/dL 0.6 0.5 0.5  Alkaline Phos 38 - 126 U/L 132(H) 122 118  AST 15 - 41 U/L 16 13(L) 13(L)  ALT 0 - 44 U/L '17 17 24      ' RADIOGRAPHIC STUDIES: I have personally reviewed the radiological images as listed and agreed with the findings in the report. No results found.   ASSESSMENT & PLAN:  Kristin Williams is a 73 y.o. female with    1. EsophagealAdenocarcinoma,with oligo met incerebellum, stage IV  -She was diagnosed with esophogeal cancer in 01/2018,with oligometastasisto the brain. Given her stage IV disease her cancer is not curable but still treatable. Goal of care it to control her disease.  -She completedSBRTto her brain meton11/27/2019.  -I personally reviewed her PET from 02/16/18 and discussed with pt and her family, it showed lung nodule she had since 2017 and has been getting smaller, likely benign. She has a benign cysts in her kidney. There is a mildly hypermetabolic right supraclavicular LN. No other evidence of distant metastasis. Her primary esophageal mass is very long and hot on PET, along with a hypermetabolic right upper paratracheal node.  -She startedconcurrentchemo RTon 02/27/2018 with  weekly carboplatin and taxol for better local disease control.Plan to complete on 04/07/18 -She has started to have blood in her phlegm, from radiation. Will monitor closely. If bleeding increases she should go to ED.  -CBC reviewed, CMP is  still pending, overall adequate to proceed with her last chemo treatment today.  -After this week she will take a break and consider FOLFOX 3 months as consolidation therapy.  -F/u in 2 weeks    2. RightBreastInvasive Ductal Carcinoma,cT1bN0M0 stage IA,Grade II, ER/PR Positive, HER 2 negative -Diagnosed through screening mammogram, same time as her esophageal cancer in October 2019.  -Given her early stage breast cancer, I previously discussedher breast cancer treatment is less urgent than esophogeal cancertreatment. -I plan tostart her onanti-estrogen therapyafter she completes chemoradiation for esophageal cancer, given her ER/PR positive cancer.If shedoes very well in 6 to 12 monthswithoutesophageal cancer without progression,we mayconsider breast surgery. -plan to start AI on next visit    3.Oligo brain mets -This is likely from her primaryesophogealcancer.  -She does have balance issues and ambulates with assistance. She knows she is at risk for fall and that she should be careful. -She is s/p SBRT. Has mild slurred speech, right sided weaknessand balance stabilityhas much improved -Will monitor with brain scan every 3 months. Next scan 05/08/18  4. H/o Left breast cancer, ER/PR negative, treated by Dr. Jana Hakim with lumpectomy, adj, chemo and radiation. -She was previously referred to genetics  -no evidence of recurrence   5. Dysphagia, odynophagia, nausea, low appetite  -secondary toher esophageal cancer and chemoradiation. -She does not like ensure or boost. She will try carnation breakfast and juicing her food. -She does not tolerate Compazine. She has Zofran but Reglan gives her the best relief. -Shecompleted  Dexa taper dose12/27/19. OK to usedexa 39m daily for 3-5 days after chemo -Odynophagia only moderately relieved with Lidocaine, Sulcrafate and magic mouthwash.  -Nausea is mild and in the morning. She will continue with Reglan.  -Appetite has improved with liquid hydrocodone as needed.   6.Acute deep vein thrombosis (DVT) of popliteal vein of left lower extremity, diagnosed on 02/28/2018  -She was found to have left lower extremity edema on his first day of chemoradiation, Doppler showed DVT -Due to the risk of GI bleeding and intracranial hemorrhage from brain metastasis, will start her on Coumadin with Lovenox bridging,doseadjust as needed. -INR at 1.65 today (04/03/2018). Will slightly increase Coumadin at 2.5 mg on Mondays and 593mthe rest of the week.  -Her tooth is very loose. Given this is her last week of treatment, she is fine to see dentist soon for extraction.   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  -Stable  8.Goal of care discussion  -she is full code now  9.COPD -She tried hycodan will mild relief.  -03/20/18 Chest Xray negative, she completed antibiotics which greatly improved her cough.   10. Peripheral swelling  -She has developed bilateral edema on her legs and hands -I strongly encouraged her to elevate her legs at home  -On Lasix every other day since last week -Her swelling has much improved, she is fine to stop Lasix this week with completion of treatment (04/03/18)  11. Hypokalemia -On oral potassium 20 mEq daily.  -K normal today   PLAN: -Labs reviewed and adequate to proceed with last weekly carbo and taxol today  -slightly increase Coumadin, she will take 2.31m94monday and 31mg72mily the rest of the week.  -Lab, flush and f/u in 2 weeks, may start anastrozole on next visit    No problem-specific Assessment & Plan  notes found for this encounter.   No orders of the defined types  were placed in this encounter.  All questions were answered. The patient knows to call the clinic with any problems, questions or concerns. No barriers to learning was detected. I spent 20 minutes counseling the patient face to face. The total time spent in the appointment was 25 minutes and more than 50% was on counseling and review of test results     [name], MD 04/03/2018   I, Joslyn Devon, am acting as scribe for [name], MD.   I have reviewed the above documentation for accuracy and completeness, and I agree with the above.

## 2018-04-03 NOTE — Telephone Encounter (Signed)
Scheduled appt per 1/20 los - gave patient AVS and calender per los.

## 2018-04-03 NOTE — Patient Instructions (Signed)
[facility] Discharge Instructions for Patients Receiving Chemotherapy  Today you received the following chemotherapy agents:  Taxol, Carboplatin  To help prevent nausea and vomiting after your treatment, we encourage you to take your nausea medication as prescribed.   If you develop nausea and vomiting that is not controlled by your nausea medication, call the clinic.   BELOW ARE SYMPTOMS THAT SHOULD BE REPORTED IMMEDIATELY:  *FEVER GREATER THAN 100.5 F  *CHILLS WITH OR WITHOUT FEVER  NAUSEA AND VOMITING THAT IS NOT CONTROLLED WITH YOUR NAUSEA MEDICATION  *UNUSUAL SHORTNESS OF BREATH  *UNUSUAL BRUISING OR BLEEDING  TENDERNESS IN MOUTH AND THROAT WITH OR WITHOUT PRESENCE OF ULCERS  *URINARY PROBLEMS  *BOWEL PROBLEMS  UNUSUAL RASH Items with * indicate a potential emergency and should be followed up as soon as possible.  Feel free to call the clinic should you have any questions or concerns. The clinic phone number is [phone].  Please show the CHEMO ALERT CARD at check-in to the Emergency Department and triage nurse.   

## 2018-04-03 NOTE — Progress Notes (Signed)
Nutrition follow-up completed with patient during infusion for esophageal adenocarcinoma and right breast invasive ductal carcinoma. Weight decreased and documented as 202.3 pounds January 20.  Edema has improved. Noted labs: Glucose 105 and albumin 2.9. Patient is complaining of nausea but reports her medication does help. She has been eating and continues to work to try to increase intake for weight maintenance.  Nutrition diagnosis: Unintentional weight loss continues.  Intervention: Educated patient to continue strategies for high protein foods for maintaining lean muscle mass. Encouraged small snacks every 2-3 hours. Encouraged soft moist foods for easier swallowing. Teach back method used.  Monitoring, evaluation, goals: Patient will tolerate increased calories and protein to maintain lean body mass.  Next visit: To be scheduled as needed.  Patient has my contact information for questions and concerns.  **Disclaimer: This note was dictated with voice recognition software. Similar sounding words can inadvertently be transcribed and this note may contain transcription errors which may not have been corrected upon publication of note.**

## 2018-04-04 ENCOUNTER — Ambulatory Visit
Admission: RE | Admit: 2018-04-04 | Discharge: 2018-04-04 | Disposition: A | Discharge status: Home / Self Care | Payer: Medicare Other | Source: Ambulatory Visit | Attending: Radiation Oncology | Admitting: Radiation Oncology

## 2018-04-04 ENCOUNTER — Other Ambulatory Visit: Payer: Self-pay

## 2018-04-04 DIAGNOSIS — C159 Malignant neoplasm of esophagus, unspecified: Secondary | ICD-10-CM | POA: Diagnosis not present

## 2018-04-04 DIAGNOSIS — C154 Malignant neoplasm of middle third of esophagus: Secondary | ICD-10-CM | POA: Diagnosis not present

## 2018-04-04 DIAGNOSIS — C7931 Secondary malignant neoplasm of brain: Secondary | ICD-10-CM | POA: Diagnosis not present

## 2018-04-04 DIAGNOSIS — Z853 Personal history of malignant neoplasm of breast: Secondary | ICD-10-CM | POA: Diagnosis not present

## 2018-04-04 DIAGNOSIS — Z510 Encounter for antineoplastic radiation therapy: Secondary | ICD-10-CM | POA: Diagnosis not present

## 2018-04-04 NOTE — Progress Notes (Signed)
Called in liquid form of Potassium 20 meq daily, dispense 30 days supply with 1 refill to [facility] in [place]

## 2018-04-05 ENCOUNTER — Ambulatory Visit
Admission: RE | Admit: 2018-04-05 | Discharge: 2018-04-05 | Disposition: A | Discharge status: Home / Self Care | Payer: Medicare Other | Source: Ambulatory Visit | Attending: Radiation Oncology | Admitting: Radiation Oncology

## 2018-04-05 DIAGNOSIS — Z853 Personal history of malignant neoplasm of breast: Secondary | ICD-10-CM | POA: Diagnosis not present

## 2018-04-05 DIAGNOSIS — Z510 Encounter for antineoplastic radiation therapy: Secondary | ICD-10-CM | POA: Diagnosis not present

## 2018-04-05 DIAGNOSIS — C7931 Secondary malignant neoplasm of brain: Secondary | ICD-10-CM | POA: Diagnosis not present

## 2018-04-05 DIAGNOSIS — C154 Malignant neoplasm of middle third of esophagus: Secondary | ICD-10-CM | POA: Diagnosis not present

## 2018-04-05 DIAGNOSIS — C159 Malignant neoplasm of esophagus, unspecified: Secondary | ICD-10-CM | POA: Diagnosis not present

## 2018-04-06 ENCOUNTER — Ambulatory Visit
Admission: RE | Admit: 2018-04-06 | Discharge: 2018-04-06 | Disposition: A | Discharge status: Home / Self Care | Payer: Medicare Other | Source: Ambulatory Visit | Attending: Radiation Oncology | Admitting: Radiation Oncology

## 2018-04-06 DIAGNOSIS — C159 Malignant neoplasm of esophagus, unspecified: Secondary | ICD-10-CM | POA: Diagnosis not present

## 2018-04-06 DIAGNOSIS — C7931 Secondary malignant neoplasm of brain: Secondary | ICD-10-CM | POA: Diagnosis not present

## 2018-04-06 DIAGNOSIS — Z853 Personal history of malignant neoplasm of breast: Secondary | ICD-10-CM | POA: Diagnosis not present

## 2018-04-06 DIAGNOSIS — Z510 Encounter for antineoplastic radiation therapy: Secondary | ICD-10-CM | POA: Diagnosis not present

## 2018-04-06 DIAGNOSIS — C154 Malignant neoplasm of middle third of esophagus: Secondary | ICD-10-CM | POA: Diagnosis not present

## 2018-04-07 ENCOUNTER — Ambulatory Visit
Admission: RE | Admit: 2018-04-07 | Discharge: 2018-04-07 | Disposition: A | Discharge status: Home / Self Care | Payer: Medicare Other | Source: Ambulatory Visit | Attending: Radiation Oncology | Admitting: Radiation Oncology

## 2018-04-07 ENCOUNTER — Other Ambulatory Visit: Payer: Self-pay | Admitting: Hematology

## 2018-04-07 ENCOUNTER — Telehealth: Payer: Self-pay

## 2018-04-07 DIAGNOSIS — C154 Malignant neoplasm of middle third of esophagus: Secondary | ICD-10-CM | POA: Diagnosis not present

## 2018-04-07 DIAGNOSIS — C159 Malignant neoplasm of esophagus, unspecified: Secondary | ICD-10-CM

## 2018-04-07 DIAGNOSIS — C7931 Secondary malignant neoplasm of brain: Secondary | ICD-10-CM | POA: Diagnosis not present

## 2018-04-07 DIAGNOSIS — Z510 Encounter for antineoplastic radiation therapy: Secondary | ICD-10-CM | POA: Diagnosis not present

## 2018-04-07 DIAGNOSIS — Z853 Personal history of malignant neoplasm of breast: Secondary | ICD-10-CM | POA: Diagnosis not present

## 2018-04-07 MED ORDER — HYDROCODONE-ACETAMINOPHEN 7.5-325 MG/15ML PO SOLN: 7.5000 mL | Freq: Three times a day (TID) | ORAL | 0 refills | Status: DC | PRN

## 2018-04-07 MED ORDER — SONAFINE EX EMUL
1.0000 "application " | Freq: Once | CUTANEOUS | Status: AC
  Administered 2018-04-07: 1 via TOPICAL

## 2018-04-07 NOTE — Telephone Encounter (Signed)
Patient calls for refill on Hycet to be sent to her pharmacy. Routed to Dr. Burr Medico

## 2018-04-10 ENCOUNTER — Telehealth: Payer: Self-pay

## 2018-04-10 ENCOUNTER — Inpatient Hospital Stay (HOSPITAL_BASED_OUTPATIENT_CLINIC_OR_DEPARTMENT_OTHER): Payer: Medicare Other | Admitting: Hematology

## 2018-04-10 ENCOUNTER — Inpatient Hospital Stay: Payer: Medicare Other

## 2018-04-10 ENCOUNTER — Telehealth: Payer: Self-pay | Admitting: Hematology

## 2018-04-10 ENCOUNTER — Other Ambulatory Visit: Payer: Self-pay

## 2018-04-10 VITALS — BP 97/48 | HR 118 | Temp 98.1°F | Resp 18 | Ht 60.0 in | Wt 199.0 lb

## 2018-04-10 VITALS — BP 110/68 | HR 88

## 2018-04-10 DIAGNOSIS — I824Z2 Acute embolism and thrombosis of unspecified deep veins of left distal lower extremity: Secondary | ICD-10-CM

## 2018-04-10 DIAGNOSIS — C159 Malignant neoplasm of esophagus, unspecified: Secondary | ICD-10-CM

## 2018-04-10 DIAGNOSIS — Z7189 Other specified counseling: Secondary | ICD-10-CM

## 2018-04-10 DIAGNOSIS — E876 Hypokalemia: Secondary | ICD-10-CM

## 2018-04-10 DIAGNOSIS — Z5111 Encounter for antineoplastic chemotherapy: Secondary | ICD-10-CM | POA: Diagnosis not present

## 2018-04-10 DIAGNOSIS — R630 Anorexia: Secondary | ICD-10-CM | POA: Diagnosis not present

## 2018-04-10 DIAGNOSIS — R1310 Dysphagia, unspecified: Secondary | ICD-10-CM | POA: Diagnosis not present

## 2018-04-10 DIAGNOSIS — E860 Dehydration: Secondary | ICD-10-CM

## 2018-04-10 DIAGNOSIS — J209 Acute bronchitis, unspecified: Secondary | ICD-10-CM | POA: Diagnosis not present

## 2018-04-10 DIAGNOSIS — Z853 Personal history of malignant neoplasm of breast: Secondary | ICD-10-CM | POA: Diagnosis not present

## 2018-04-10 DIAGNOSIS — C50911 Malignant neoplasm of unspecified site of right female breast: Secondary | ICD-10-CM | POA: Diagnosis not present

## 2018-04-10 DIAGNOSIS — I82432 Acute embolism and thrombosis of left popliteal vein: Secondary | ICD-10-CM | POA: Diagnosis not present

## 2018-04-10 DIAGNOSIS — C7931 Secondary malignant neoplasm of brain: Secondary | ICD-10-CM

## 2018-04-10 DIAGNOSIS — R110 Nausea: Secondary | ICD-10-CM

## 2018-04-10 DIAGNOSIS — J449 Chronic obstructive pulmonary disease, unspecified: Secondary | ICD-10-CM | POA: Diagnosis not present

## 2018-04-10 DIAGNOSIS — Z95828 Presence of other vascular implants and grafts: Secondary | ICD-10-CM

## 2018-04-10 LAB — CMP (CANCER CENTER ONLY)
ALK PHOS: 151 U/L — AB (ref 38–126)
ALT: 17 U/L (ref 0–44)
AST: 13 U/L — ABNORMAL LOW (ref 15–41)
Albumin: 2.7 g/dL — ABNORMAL LOW (ref 3.5–5.0)
Anion gap: 8 (ref 5–15)
BUN: 9 mg/dL (ref 8–23)
CALCIUM: 9.6 mg/dL (ref 8.9–10.3)
CO2: 25 mmol/L (ref 22–32)
Chloride: 102 mmol/L (ref 98–111)
Creatinine: 0.79 mg/dL (ref 0.44–1.00)
GFR, Est AFR Am: >60 mL/min (ref >60)
GFR, Estimated: >60 mL/min (ref >60)
Glucose, Bld: 119 mg/dL — ABNORMAL HIGH (ref 70–99)
Potassium: 4 mmol/L (ref 3.5–5.1)
SODIUM: 135 mmol/L (ref 135–145)
Total Bilirubin: 0.8 mg/dL (ref 0.3–1.2)
Total Protein: 6.4 g/dL — ABNORMAL LOW (ref 6.5–8.1)

## 2018-04-10 LAB — CBC WITH DIFFERENTIAL (CANCER CENTER ONLY)
Basophils Absolute: 0 10*3/uL (ref 0.0–0.1)
Basophils Relative: 1 %
Eosinophils Absolute: 0 10*3/uL (ref 0.0–0.5)
Eosinophils Relative: 1 %
HEMATOCRIT: 32.2 % — AB (ref 36.0–46.0)
Hemoglobin: 10.7 g/dL — ABNORMAL LOW (ref 12.0–15.0)
LYMPHS ABS: 0.1 10*3/uL — AB (ref 0.7–4.0)
Lymphocytes Relative: 6 %
MCH: 31.4 pg (ref 26.0–34.0)
MCHC: 33.2 g/dL (ref 30.0–36.0)
MCV: 94.4 fL (ref 80.0–100.0)
Monocytes Absolute: 0.5 10*3/uL (ref 0.1–1.0)
Monocytes Relative: 19 %
Neutro Abs: 1.8 10*3/uL (ref 1.7–7.7)
Neutrophils Relative %: 74 %
Platelet Count: 216 10*3/uL (ref 150–400)
RBC: 3.41 MIL/uL — ABNORMAL LOW (ref 3.87–5.11)
RDW: 18 % — AB (ref 11.5–15.5)
WBC Count: 2.4 10*3/uL — ABNORMAL LOW (ref 4.0–10.5)
WBC Morphology: 26
nRBC: 0.8 % — ABNORMAL HIGH (ref 0.0–0.2)
nRBC: 2 /100 WBC — ABNORMAL HIGH

## 2018-04-10 LAB — PROTIME-INR
INR: 1.81
Prothrombin Time: 20.7 seconds — ABNORMAL HIGH (ref 11.4–15.2)

## 2018-04-10 MED ORDER — SODIUM CHLORIDE 0.9 % IV SOLN
Freq: Once | INTRAVENOUS | Status: AC
  Administered 2018-04-10: 13:00:00 via INTRAVENOUS
  Filled 2018-04-10: qty 250

## 2018-04-10 MED ORDER — SODIUM CHLORIDE 0.9 % IV SOLN
INTRAVENOUS | Status: DC
  Filled 2018-04-10: qty 250

## 2018-04-10 MED ORDER — DEXAMETHASONE SODIUM PHOSPHATE 10 MG/ML IJ SOLN
INTRAMUSCULAR | Status: AC
  Filled 2018-04-10: qty 1

## 2018-04-10 MED ORDER — DEXAMETHASONE SODIUM PHOSPHATE 10 MG/ML IJ SOLN
5.0000 mg | Freq: Once | INTRAMUSCULAR | Status: AC
  Administered 2018-04-10: 5 mg via INTRAVENOUS

## 2018-04-10 MED ORDER — ONDANSETRON HCL 4 MG/2ML IJ SOLN
8.0000 mg | Freq: Once | INTRAMUSCULAR | Status: AC
  Administered 2018-04-10: 8 mg via INTRAVENOUS

## 2018-04-10 MED ORDER — ONDANSETRON HCL 4 MG/2ML IJ SOLN
INTRAMUSCULAR | Status: AC
  Filled 2018-04-10: qty 4

## 2018-04-10 MED ORDER — SODIUM CHLORIDE 0.9% FLUSH
10.0000 mL | Freq: Once | INTRAVENOUS | Status: AC | PRN
  Administered 2018-04-10: 10 mL
  Filled 2018-04-10: qty 10

## 2018-04-10 MED ORDER — HEPARIN SOD (PORK) LOCK FLUSH 100 UNIT/ML IV SOLN
500.0000 [IU] | Freq: Once | INTRAVENOUS | Status: AC | PRN
  Administered 2018-04-10: 500 [IU]
  Filled 2018-04-10: qty 5

## 2018-04-10 MED ORDER — MORPHINE SULFATE (CONCENTRATE) 10 MG /0.5 ML PO SOLN: 10.0000 mg | Freq: Four times a day (QID) | ORAL | 0 refills | Status: DC | PRN

## 2018-04-10 MED ORDER — SODIUM CHLORIDE 0.9 % IV SOLN: Freq: Once | INTRAVENOUS | Status: DC

## 2018-04-10 MED FILL — MORPHINE SULF 100 MG/5 ML S: 100 | 15 days supply | Qty: 30 | Fill #0

## 2018-04-10 NOTE — Progress Notes (Signed)
Pt reports feeling better after IVF/antiemetics.  VS improved/stable.  MD Burr Medico aware.  Pt scheduled for 1/31 for more IVF.  Verbalized understanding of d/c instructions to orally hydrate and to call with any questions or concerns.

## 2018-04-10 NOTE — Patient Instructions (Signed)

## 2018-04-10 NOTE — Telephone Encounter (Signed)
Patient's daughter calls stating that she has had N/V all weekend, very little po intake, last rad treatment was Friday, instructed her to bring patient in around 11:30 for labs/port flush, see Dr. Burr Medico and then get IVF, she verbalized an understanding.  Scheduling message was sent.

## 2018-04-10 NOTE — Progress Notes (Signed)
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    01/30/2018 Initial Diagnosis    Metastasis to brain [facility])    01/31/2018 Imaging    MRI Brain W WO Contrast 01/31/18  IMPRESSION: 3 Tesla study does not disclose any additional lesions. Solitary metastasis in the right superior cerebellum measuring 2.9 x 2.6 x 3.0 cm with mild surrounding edema. 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    02/03/2018 - 02/08/2018 Radiation Therapy    SBRT with Dr. Isidore Moos on 02/03/18, 02/06/18, 02/08/18     Esophageal cancer, stage IV ([place])   01/08/2018 Imaging    CT AP W Constrast 01/08/18  IMPRESSION: 1. Nonobstructed, nondistended bowel. No acute inflammatory process. 2. Moderate-sized hiatal hernia noted. 3. Low-density 1.7 cm left adrenal nodule likely to represent a small adenoma. Lower pole left renal cyst measuring 1.8 cm. 4. Moderate aortic atherosclerosis. 5. Lumbar spondylosis and facet arthropathy.    01/27/2018 Imaging    CT Chest W Contrast 01/27/18  IMPRESSION: 1. Large mass involving the distal half of the esophagus spanning approximately 11.7 cm is identified compatible with primary esophageal  neoplasm. 2. Small posterior mediastinal and high right paratracheal lymph nodes are noted which may represent foci of metastatic adenopathy. 3. The small pulmonary nodules in the right lung are unchanged from 01/15/2016 and are favored to represent a benign process. 4. Aortic Atherosclerosis (ICD10-I70.0) and Emphysema (ICD10-J43.9). 5. Coronary artery atherosclerotic calcifications.    01/30/2018 Initial Diagnosis    Esophageal cancer, stage IV ([place])    02/03/2018 - 02/08/2018 Radiation Therapy    SBRT to oligo brain metastasis, in 3 fractions, under Dr. Isidore Moos     02/16/2018 PET scan    PET 02/16/18 IMPRESSION: 1. A 15.7 cm in length esophageal mass extends from the aortic arch level down to the distal esophagus, maximum SUV 22.4, compatible with malignancy. There is a small right lower neck level IV lymph node with lower than mediastinal blood pool activity, and a right upper paratracheal lymph node with just above mediastinal blood pool activity. 2. Faint speckled heterogeneity in the liver, for example in the lateral segment left hepatic lobe, is likely incidental/benign given the lack of correlate of CT finding. It might be prudent to obtain an MRI just to make sure that there is not a small early metastatic lesion in this vicinity. 3. Known right cerebellar vermian metastatic lesion, with expected hypo activity compared to the surrounding brain activity. 4. The right middle lobe pulmonary nodule measures 1.1 by 1.0 cm and is mildly hypermetabolic with maximum SUV 2.6. This lesion measured 6 by 5 mm back on 12/06/2003 and 1.2 by 1.1 cm on 10/15/2015. I am uncertain what to make of the very slow growth and mildly  accentuated metabolic activity. This could be a chronic granulomatous process. Low-grade adenocarcinoma of the lung seems unlikely to be so indolent as to only mildly increase over a 14 year period, but is presumably a differential diagnostic  consideration. Surveillance is suggested. 5. Other imaging findings of potential clinical significance: Aortic Atherosclerosis (ICD10-I70.0). Descending and sigmoid colon diverticulosis. Gas in the urinary bladder, most common cause would be recent catheterization. Minimal chronic left ethmoid sinusitis.    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 Ultrasound-guided biopsy is recommended.    01/10/2018 Initial Biopsy    Diagnsis from Breast Biopsy 01/10/18  Breast, right, needle core biopsy, 6 o'clock - INVASIVE DUCTAL CARCINOMA WITH CALCIFICATIONS, SEE COMMENT. - DUCTAL CARCINOMA IN SITU. 1 of 2 FINAL for Kristin Williams, Kristin Williams [phone]) Microscopic Comment The carcinoma appears grade 2. Prognostic markers will be ordered. Dr. [name] has reviewed the case. The case was called to [facility] on 01/11/2018.    01/10/2018 Receptors her2    The tumor cells are NEGATIVE for Her2 (1+). Estrogen Receptor: 100%, POSITIVE, STRONG STAINING INTENSITY Progesterone Receptor: 100%, POSITIVE, STRONG STAINING INTENSITY Proliferation Marker Ki67: 15%    01/10/2018 Cancer Staging    Staging form: Breast, AJCC 8th Edition - Clinical stage from 01/10/2018: Stage IA (cT1b, cN0, cM0, G2, ER+, PR+, HER2-) - Signed by [name], MD on 02/03/2018    01/30/2018 Initial Diagnosis    Breast cancer ([place])      INTERVAL HISTORY:  Kristin Williams is here for a follow up post chemoRt. She presents to the clinic today with  her husband. She notes she feels sick and dehydrated. She notes her throat being in pain which effects her eating. She takes liquid hydrocodone q8hours and lidocaine with not much relief anymore. She has been drinking room temp water and cannot keep it down much but tries. She has not been taking Zofran or compazine because it does not help, she takes Reglan and ativan for her nausea.     REVIEW OF SYSTEMS:   Constitutional: Denies fevers, chills or abnormal weight loss (+) Low food intake  Eyes: Denies blurriness of vision Ears, nose, mouth, throat, and face: Denies mucositis (+) Throat pain  Respiratory: Denies cough, dyspnea or wheezes Cardiovascular: Denies palpitation, chest discomfort or lower extremity swelling Gastrointestinal:  Denies heartburn or change in bowel habits (+) nausea  Skin: Denies abnormal skin rashes Lymphatics: Denies new lymphadenopathy or easy bruising Neurological:Denies numbness, tingling or new weaknesses Behavioral/Psych: Mood is stable, no new changes  All other systems were reviewed with the patient and are negative.  MEDICAL HISTORY:  Past Medical History:  Diagnosis Date  . Arthritis   . Cancer (HCC)    Cervical  . Cancer ([place])    Breast  . Cancer ([place])    Vaginal  . Complication of anesthesia   . COPD (chronic obstructive pulmonary disease) ([place])   . Headache(784.0)   . Hyperlipidemia   . Leg pain   . Peripheral vascular disease ([place])   . Personal history of  chemotherapy   . Personal history of radiation therapy   . PONV (postoperative nausea and vomiting)     SURGICAL HISTORY: Past Surgical History:  Procedure Laterality Date  . ABDOMINAL HYSTERECTOMY  1975  . BREAST LUMPECTOMY  2005  . GANGLION CYST EXCISION  1977  . ILIAC ARTERY STENT  04/20/2004   CSD right iliac occlusive disease  . IR IMAGING GUIDED PORT INSERTION  02/22/2018    I have reviewed the social history and family history with the patient and they are unchanged from  previous note.  ALLERGIES:  is allergic to flonase [fluticasone propionate].  MEDICATIONS:  Current Outpatient Medications  Medication Sig Dispense Refill  . acetaminophen (TYLENOL) 160 MG/5ML liquid Take 325 mg by mouth every 4 (four) hours as needed for fever.    Marland Kitchen dexamethasone (DECADRON) 4 MG tablet Take 1 tablet (4 mg total) by mouth daily. Take 4 mg daily for 2 to 3 days after chemotherapy treatment 30 tablet 1  . furosemide (LASIX) 20 MG tablet Take 1 tablet (20 mg total) by mouth every other day. 15 tablet 0  . HYDROcodone-acetaminophen (HYCET) 7.5-325 mg/15 ml solution Take 7.5 mLs by mouth every 8 (eight) hours as needed for moderate pain. 120 mL 0  . HYDROcodone-homatropine (HYCODAN) 5-1.5 MG/5ML syrup Take 5 mLs by mouth every 6 (six) hours as needed for cough. 120 mL 0  . lidocaine (XYLOCAINE) 2 % solution Patient: Mix 1part 2% viscous lidocaine, 1part H20. Swallow 28m of diluted mixture, 246m before meals and at bedtime, up to QID 100 mL 5  . lidocaine-prilocaine (EMLA) cream Apply 1 application topically as needed. 30 g 1  . LORazepam (ATIVAN) 1 MG tablet Take 1 tablet (1 mg total) by mouth 3 (three) times daily as needed for anxiety. 15 tablet 0  . metoCLOPramide (REGLAN) 10 MG tablet Take 1 tablet (10 mg total) by mouth every 6 (six) hours as needed for nausea. 60 tablet 2  . omeprazole (PRILOSEC) 20 MG capsule Take 40 mg by mouth daily.     . ondansetron (ZOFRAN) 8 MG tablet Take 1 tablet (8 mg total) by mouth every 8 (eight) hours as needed for nausea or vomiting. 20 tablet 0  . potassium chloride (KLOR-CON) 20 MEQ packet Take 20 mEq by mouth daily. 30 packet 1  . potassium chloride SA (K-DUR,KLOR-CON) 20 MEQ tablet Take 1 tablet (20 mEq total) by mouth daily. 30 tablet 2  . prochlorperazine (COMPAZINE) 10 MG tablet Take 1 tablet (10 mg total) by mouth every 6 (six) hours as needed for nausea or vomiting. 30 tablet 0  . sucralfate (CARAFATE) 1 g tablet Dissolve 1 tablet in  10 mL H20 and swallow up to QID to soothe throat. 40 tablet 5  . warfarin (COUMADIN) 2.5 MG tablet Take 2 tablets once daily with supper and then as directed by MD 90 tablet 2  . enoxaparin (LOVENOX) 150 MG/ML injection Inject 0.93 mLs (140 mg total) into the skin daily for 2 days. 12/21 and 12/22 2 Syringe 0  . Morphine Sulfate (MORPHINE CONCENTRATE) 10 mg / 0.5 ml concentrated solution Take 0.5 mLs (10 mg total) by mouth every 6 (six) hours as needed for severe pain. 30 mL 0   No current facility-administered medications for this visit.     PHYSICAL EXAMINATION: ECOG PERFORMANCE STATUS: 3 - Symptomatic, >50% confined to bed  Vitals:   04/10/18 1155  BP: (!) 97/48  Pulse: (!) 118  Resp: 18  Temp: 98.1 F (36.7  C)  SpO2: 100%   Filed Weights   04/10/18 1155  Weight: 199 lb (90.3 kg)    GENERAL:alert, no distress and comfortable SKIN: skin color, texture, turgor are normal, no rashes or significant lesions EYES: normal, Conjunctiva are pink and non-injected, sclera clear OROPHARYNX:no exudate, no erythema and lips, buccal mucosa, and tongue normal  NECK: supple, thyroid normal size, non-tender, without nodularity LYMPH:  no palpable lymphadenopathy in the cervical, axillary or inguinal LUNGS: clear to auscultation and percussion with normal breathing effort HEART: regular rate & rhythm and no murmurs and no lower extremity edema ABDOMEN:abdomen soft, non-tender and normal bowel sounds Musculoskeletal:no cyanosis of digits and no clubbing  NEURO: alert & oriented x 3 with fluent speech, no focal motor/sensory deficits  LABORATORY DATA:  I have reviewed the data as listed CBC Latest Ref Rng & Units 04/10/2018 04/03/2018 03/27/2018  WBC 4.0 - 10.5 K/uL 2.4(L) 3.7(L) 3.9(L)  Hemoglobin 12.0 - 15.0 g/dL 10.7(L) 10.9(L) 10.1(L)  Hematocrit 36.0 - 46.0 % 32.2(L) 33.5(L) 31.4(L)  Platelets 150 - 400 K/uL 216 213 167     CMP Latest Ref Rng & Units 04/10/2018 04/03/2018 03/27/2018   Glucose 70 - 99 mg/dL 119(H) 105(H) 109(H)  BUN 8 - 23 mg/dL 9 8 6(L)  Creatinine 0.44 - 1.00 mg/dL 0.79 0.75 0.62  Sodium 135 - 145 mmol/L 135 137 140  Potassium 3.5 - 5.1 mmol/L 4.0 4.8 2.9(LL)  Chloride 98 - 111 mmol/L 102 103 105  CO2 22 - 32 mmol/L _0 Calcium 8.9 - 10.3 mg/dL 9.6 9.6 8.4(L)  Total Protein 6.5 - 8.1 g/dL 6.4(L) 6.5 5.6(L)  Total Bilirubin 0.3 - 1.2 mg/dL 0.8 0.6 0.5  Alkaline Phos 38 - 126 U/L 151(H) 132(H) 122  AST 15 - 41 U/L 13(L) 16 13(L)  ALT 0 - 44 U/L _1 RADIOGRAPHIC STUDIES: I have personally reviewed the radiological images as listed and agreed with the findings in the report. No results found.   ASSESSMENT & PLAN:  OPHELIA SIPE is a 73 y.o. female with   1. Dysphagia,odynophagia,nausea, low appetite  -secondary toher esophageal cancer and chemoradiation. -She does not like ensure or boost. She will try carnation breakfast and juicing her food. -She does not tolerate Compazine and Zofran gives no relief.  -She takes Prilosec for her Acid reflux.  -Odynophagiaonly moderately relieved with Lidocaine,Sulcrafate, magicmouthwash and liquid hydrocodone. I will prescribe her liquid morphine today to take 30-40 minutes before eating. (04/10/18) -I discussed if pain medication can't help her eat, then she may have to consider feeding tube.  -Her nausea is stable with Reglan and ativan. I suggest she take low dose dexa 57m daily  as needed to help improve this   2. EsophagealAdenocarcinoma,with oligo met incerebellum, stage IV  -She was diagnosed with esophogeal cancer in 01/2018,with oligometastasisto the brain. Given her stage IV disease her cancer is not curable but still treatable. Goal of care it to control her disease.  -She completedSBRTto her brain meton11/27/2019 and completed chemoRT on 04/07/18.  -May consider FOLFOX as next treatment plan after she recovers from latest treatment.  -Post treatment she still  has residual side effects of nausea, Dysphagia,odynophagia,nausea, and low appetite. Will give her IV Fluids as she has been dehydrated. I strongly encouraged her to drink plenty of water and increase by mouth intake.  -Labs revewied, WBC at 2.4, Hg at 10.7, Glucose at 119, Protein at 6.4. albumin at 2.7.  -  F/u in 1 week  3. RightBreastInvasive Ductal Carcinoma,cT1bN0M0 stage IA,Grade II, ER/PR Positive, HER 2 negative -Diagnosed through screening mammogram, same time as her esophageal cancer in October 2019.  -Given her early stage breast cancer, I previously discussedher breast cancer treatment is less urgent than esophogeal cancertreatment. -I plan tostart her onanti-estrogen therapyafter she completes chemoradiation for esophageal cancer, given her ER/PR positive cancer.If shedoes very well in 6 to 12 monthswithoutesophageal cancer without progression,we mayconsider breast surgery. -plan to start AI when she recovers from chemoRT    4.Oligo brain mets -This is likely from her primaryesophogealcancer.  -She does have balance issues and ambulates with assistance. She knows she is at risk for fall and that she should be careful. -She is s/p SBRT. Her mild slurred speech, right sided weaknessand balance stabilityhas much improved -Will monitor with brain scan every 3 months. Next scan 05/08/18  5. H/o Left breast cancer, ER/PR negative, treated by Dr. Jana Hakim with lumpectomy, adj, chemo and radiation. -She was previously referred to genetics  -no evidence of recurrence    6.Acute deep vein thrombosis (DVT) of popliteal vein of left lower extremity, diagnosed on 02/28/2018  -She was found to have left lower extremity edema on his first day of chemoradiation, Doppler showed DVT -Due to the risk of GI bleeding and intracranial hemorrhage from brain metastasis, will start her on Coumadin with Lovenox bridging,doseadjust as needed. -Her tooth is very loose.  Given this is her last week of treatment, she is fine to see dentist soon for extraction.  -INR at 1.81 today (04/10/18). I will increase her to 28m every day.   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  -Stable  8.Goal of care discussion  -she is full code now  9.COPD -She has chronic cough -overall stable   10. Peripheral swelling -She has developed bilateral edema on her legs and hands -I strongly encouragedher to elevate her legs at home  -Her swelling has much improved, she has stopped lasix   11. Hypokalemia -On oral potassium 20 mEq daily. -K normal at 4 today (04/10/18). She is fine to take oral potassium every other day for this week.   PLAN: -I prescribed her liquid morphine today  -Labs revewied, Coumadin at 1.8, will increase Coumadin to 569mdaily  -decrease KCL to every other day  -IVF 2hrs today and later this week -Add 2hr IVF to 2/5  -lab, flush, f/u in 1 week     No problem-specific Assessment & Plan notes found for this encounter.   No orders of the defined types were placed in this encounter.  All questions were answered. The patient knows to call the clinic with any problems, questions or concerns. No barriers to learning was detected. I spent 20 minutes counseling the patient face to face. The total time spent in the appointment was 25 minutes and more than 50% was on counseling and review of test results     YaTruitt MerleMD 04/10/2018   I, AmJoslyn Devonam acting as scribe for YaTruitt MerleMD.   I have reviewed the above documentation for accuracy and completeness, and I agree with the above.

## 2018-04-10 NOTE — Telephone Encounter (Signed)
Scheduled appt per 01/27 los. ° °Printed calendar and avs. °

## 2018-04-11 ENCOUNTER — Encounter: Payer: Self-pay | Admitting: Hematology

## 2018-04-11 ENCOUNTER — Ambulatory Visit: Payer: Medicare Other

## 2018-04-12 ENCOUNTER — Emergency Department (HOSPITAL_COMMUNITY)
Admission: EM | Admit: 2018-04-12 | Discharge: 2018-04-12 | Disposition: A | Discharge status: Home / Self Care | Payer: Medicare Other | Attending: Emergency Medicine | Admitting: Emergency Medicine

## 2018-04-12 ENCOUNTER — Emergency Department (HOSPITAL_COMMUNITY): Payer: Medicare Other

## 2018-04-12 ENCOUNTER — Other Ambulatory Visit: Payer: Self-pay

## 2018-04-12 ENCOUNTER — Encounter (HOSPITAL_COMMUNITY): Payer: Self-pay | Admitting: Emergency Medicine

## 2018-04-12 DIAGNOSIS — R000 Tachycardia, unspecified: Secondary | ICD-10-CM | POA: Diagnosis not present

## 2018-04-12 DIAGNOSIS — J441 Chronic obstructive pulmonary disease with (acute) exacerbation: Secondary | ICD-10-CM | POA: Diagnosis not present

## 2018-04-12 DIAGNOSIS — R05 Cough: Secondary | ICD-10-CM | POA: Diagnosis not present

## 2018-04-12 DIAGNOSIS — J40 Bronchitis, not specified as acute or chronic: Secondary | ICD-10-CM

## 2018-04-12 DIAGNOSIS — Z79899 Other long term (current) drug therapy: Secondary | ICD-10-CM | POA: Insufficient documentation

## 2018-04-12 DIAGNOSIS — R069 Unspecified abnormalities of breathing: Secondary | ICD-10-CM | POA: Diagnosis not present

## 2018-04-12 DIAGNOSIS — Z87891 Personal history of nicotine dependence: Secondary | ICD-10-CM | POA: Insufficient documentation

## 2018-04-12 DIAGNOSIS — R0689 Other abnormalities of breathing: Secondary | ICD-10-CM | POA: Diagnosis not present

## 2018-04-12 DIAGNOSIS — R0602 Shortness of breath: Secondary | ICD-10-CM | POA: Diagnosis not present

## 2018-04-12 LAB — BASIC METABOLIC PANEL
Anion gap: 9 (ref 5–15)
BUN: 15 mg/dL (ref 8–23)
CO2: 23 mmol/L (ref 22–32)
Calcium: 8.7 mg/dL — ABNORMAL LOW (ref 8.9–10.3)
Chloride: 104 mmol/L (ref 98–111)
Creatinine, Ser: 0.66 mg/dL (ref 0.44–1.00)
GFR calc Af Amer: >60 mL/min (ref >60)
GFR calc non Af Amer: >60 mL/min (ref >60)
GLUCOSE: 101 mg/dL — AB (ref 70–99)
Potassium: 3.7 mmol/L (ref 3.5–5.1)
Sodium: 136 mmol/L (ref 135–145)

## 2018-04-12 LAB — CBC
HCT: 29.9 % — ABNORMAL LOW (ref 36.0–46.0)
Hemoglobin: 9.4 g/dL — ABNORMAL LOW (ref 12.0–15.0)
MCH: 31 pg (ref 26.0–34.0)
MCHC: 31.4 g/dL (ref 30.0–36.0)
MCV: 98.7 fL (ref 80.0–100.0)
Platelets: 198 10*3/uL (ref 150–400)
RBC: 3.03 MIL/uL — AB (ref 3.87–5.11)
RDW: 18.6 % — ABNORMAL HIGH (ref 11.5–15.5)
WBC: 2.4 10*3/uL — ABNORMAL LOW (ref 4.0–10.5)
nRBC: 2.1 % — ABNORMAL HIGH (ref 0.0–0.2)

## 2018-04-12 LAB — BRAIN NATRIURETIC PEPTIDE: B Natriuretic Peptide: 254 pg/mL — ABNORMAL HIGH (ref 0.0–100.0)

## 2018-04-12 LAB — PROTIME-INR
INR: 2.37
Prothrombin Time: 25.6 seconds — ABNORMAL HIGH (ref 11.4–15.2)

## 2018-04-12 LAB — TROPONIN I: Troponin I: <0.03 ng/mL (ref <0.03)

## 2018-04-12 MED ORDER — IPRATROPIUM-ALBUTEROL 0.5-2.5 (3) MG/3ML IN SOLN
3.0000 mL | Freq: Once | RESPIRATORY_TRACT | Status: AC
  Administered 2018-04-12: 3 mL via RESPIRATORY_TRACT
  Filled 2018-04-12: qty 3

## 2018-04-12 MED ORDER — METHYLPREDNISOLONE SODIUM SUCC 125 MG IJ SOLR
125.0000 mg | Freq: Once | INTRAMUSCULAR | Status: AC
  Administered 2018-04-12: 125 mg via INTRAVENOUS
  Filled 2018-04-12: qty 2

## 2018-04-12 MED ORDER — SODIUM CHLORIDE 0.9 % IV SOLN
1000.0000 mL | INTRAVENOUS | Status: DC
  Administered 2018-04-12: 1000 mL via INTRAVENOUS

## 2018-04-12 MED ORDER — SODIUM CHLORIDE 0.9 % IV BOLUS (SEPSIS)
500.0000 mL | Freq: Once | INTRAVENOUS | Status: AC
  Administered 2018-04-12: 500 mL via INTRAVENOUS

## 2018-04-12 NOTE — ED Triage Notes (Signed)
Pt became sob this morning after a coughing spell. Hx of esophageal cancer.  Pt given neb tx by ems.  resp even and non labored at this time and pt reports she feels better.

## 2018-04-12 NOTE — ED Notes (Signed)
Gave pt oral hygiene products per pt request.

## 2018-04-12 NOTE — ED Notes (Signed)
Patient states she is ready to go home and eat.

## 2018-04-12 NOTE — ED Provider Notes (Signed)
[facility] EMERGENCY DEPARTMENT Provider Note   CSN: [id number] Arrival date & time: 04/12/18  2694     History   Chief Complaint Chief Complaint  Patient presents with  . Shortness of Breath    HPI Kristin Williams is a 73 y.o. female.  HPI Patient presents to the emergency room for evaluation of shortness of breath.  Patient has history of multiple medical problems including metastatic esophageal cancer.  She is actively undergoing treatments including radiation.  This morning she had shortness of breath after a coughing spell.  Patient has been having significant coughing spells especially in the morning.  She also has been bringing up some whitish mucus.  Took a nebulizer treatment at home.  Her symptoms have improved since then.  She denies any fevers or chills.  No chest pain.  She is not have any difficulty breathing.  Patient does have a history of DVT and is currently on anticoagulation. Past Medical History:  Diagnosis Date  . Arthritis   . Cancer (HCC)    Cervical  . Cancer ([place])    Breast  . Cancer ([place])    Vaginal  . Complication of anesthesia   . COPD (chronic obstructive pulmonary disease) ([place])   . Headache(784.0)   . Hyperlipidemia   . Leg pain   . Peripheral vascular disease ([place])   . Personal history of chemotherapy   . Personal history of radiation therapy   . PONV (postoperative nausea and vomiting)     Patient Active Problem List   Diagnosis Date Noted  . Port-A-Cath in place 03/27/2018  . Cancer ([place])   . DVT, lower extremity, distal, acute, left ([place]) 03/03/2018  . Left leg swelling 02/27/2018  . Thrombocytopenia ([place]) 02/27/2018  . Protein calorie malnutrition ([place]) 02/27/2018  . Cancer of thoracic esophagus ([place]) 02/21/2018  . Goals of care, counseling/discussion 02/17/2018  . Metastasis to brain ([place]) 01/30/2018  . Esophageal cancer, stage IV ([place]) 01/30/2018  . 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 Hypertension, essential 01/30/2018  . Hyperlipidemia 01/30/2018  . Tobacco use disorder 01/30/2018  .  Metastatic brain hemorrhage 01/27/2018  . Vasogenic cerebral edema ([place]) 01/27/2018  . Aftercare following surgery of the circulatory system, [place] 12/06/2012  . Iliac artery stenosis, right ([place]) 12/01/2011    Past Surgical History:  Procedure Laterality Date  . ABDOMINAL HYSTERECTOMY  1975  . BREAST LUMPECTOMY  2005  . GANGLION CYST EXCISION  1977  . ILIAC ARTERY STENT  04/20/2004   CSD right iliac occlusive disease  . IR IMAGING GUIDED PORT INSERTION  02/22/2018     OB History   No obstetric history on file.      Home Medications    Prior to Admission medications   Medication Sig Start Date End Date Taking? Authorizing Provider  dexamethasone (DECADRON) 4 MG tablet Take 1 tablet (4 mg total) by mouth daily. Take 4 mg daily for 2 to 3 days after chemotherapy treatment 03/01/18  Yes [name], MD  dextromethorphan-guaiFENesin [facility] DM) 30-600 MG 12hr tablet Take 1 tablet by mouth as needed for cough.   Yes [provider]  furosemide (LASIX) 20 MG tablet Take 1 tablet (20 mg total) by mouth every other day. 03/27/18  Yes [name], MD  HYDROcodone-acetaminophen (HYCET) 7.5-325 mg/15 ml solution Take 7.5 mLs by mouth every 8 (eight) hours as needed for moderate pain. 04/07/18 04/07/19 Yes [name], MD  lidocaine (XYLOCAINE)  2 % solution Patient: Mix 1part 2% viscous lidocaine, 1part H20. Swallow 88mL of diluted mixture, 77min before meals and at bedtime, up to QID 03/06/18  Yes Eppie Gibson, MD  lidocaine-prilocaine (EMLA) cream Apply 1 application topically as needed. 02/27/18  Yes Curcio, Roselie Awkward, NP  LORazepam (ATIVAN) 1 MG tablet Take 1 tablet (1 mg total) by mouth 3 (three) times daily as needed for anxiety. 01/08/18  Yes Nat Christen, MD  metoCLOPramide (REGLAN) 10 MG tablet Take 1 tablet (10 mg total) by mouth every 6 (six) hours as  needed for nausea. 03/28/18  Yes [name], MD  Morphine Sulfate (MORPHINE CONCENTRATE) 10 mg / 0.5 ml concentrated solution Take 0.5 mLs (10 mg total) by mouth every 6 (six) hours as needed for severe pain. 04/10/18  Yes [name], MD  omeprazole (PRILOSEC) 20 MG capsule Take 40 mg by mouth daily.  09/09/10  Yes [provider]  potassium chloride 20 MEQ/15ML (10%) SOLN Take 15 mLs by mouth daily. 04/05/18  Yes [provider]  sucralfate (CARAFATE) 1 g tablet Dissolve 1 tablet in 10 mL H20 and swallow up to QID to soothe throat. 03/06/18  Yes Eppie Gibson, MD  warfarin (COUMADIN) 2.5 MG tablet Take 2 tablets once daily with supper and then as directed by MD 02/28/18  Yes Tanner, [name]., PA-C  acetaminophen (TYLENOL) 160 MG/5ML liquid Take 325 mg by mouth every 4 (four) hours as needed for fever.    [provider]  enoxaparin (LOVENOX) 150 MG/ML injection Inject 0.93 mLs (140 mg total) into the skin daily for 2 days. 12/21 and 12/22 03/03/18 03/05/18  Tanner, [name]., PA-C  HYDROcodone-homatropine (HYCODAN) 5-1.5 MG/5ML syrup Take 5 mLs by mouth every 6 (six) hours as needed for cough. Patient not taking: Reported on 04/12/2018 03/13/18   [name], MD  ondansetron (ZOFRAN) 8 MG tablet Take 1 tablet (8 mg total) by mouth every 8 (eight) hours as needed for nausea or vomiting. Patient not taking: Reported on 04/12/2018 02/27/18   [name], MD    Family History Family History  Problem Relation Age of Onset  . Cancer Mother 57       Non-Hodgkins Disease T-Cell  . Heart failure Father   . COPD Father   . Heart disease Brother 83       MI and Heart Disease before age 2  . Heart attack Brother   . Lung cancer Maternal Uncle   . Cancer Cousin   . Prostate cancer Maternal Uncle   . Prostate cancer Brother        half brother  . Breast cancer Neg Hx     Social History Social History   Tobacco Use  . Smoking status: Former Smoker    Packs/day: 0.50    Years: 40.00     Pack years: 20.00    Types: Cigarettes    Last attempt to quit: 01/27/2018    Years since quitting: 0.2  . Smokeless tobacco: Never Used  Substance Use Topics  . Alcohol use: No  . Drug use: No     Allergies   Flonase [fluticasone propionate]   Review of Systems Review of Systems  All other systems reviewed and are negative.    Physical Exam Updated Vital Signs BP 107/79   Pulse 86   Temp 97.9 F (36.6 C) (Oral)   Resp 19   Ht 1.524 m (5')   Wt 90 kg   LMP 02/17/2018   SpO2 95%  BMI 38.75 kg/m   Physical Exam Vitals signs and nursing note reviewed.  Constitutional:      General: She is not in acute distress.    Appearance: She is not ill-appearing.  HENT:     Head: Normocephalic and atraumatic.     Right Ear: External ear normal.     Left Ear: External ear normal.  Eyes:     General: No scleral icterus.       Right eye: No discharge.        Left eye: No discharge.     Conjunctiva/sclera: Conjunctivae normal.  Neck:     Musculoskeletal: Neck supple.     Trachea: No tracheal deviation.  Cardiovascular:     Rate and Rhythm: Normal rate and regular rhythm.  Pulmonary:     Effort: Pulmonary effort is normal. No respiratory distress.     Breath sounds: No stridor. Wheezing present. No rales.  Abdominal:     General: Bowel sounds are normal. There is no distension.     Palpations: Abdomen is soft.     Tenderness: There is no abdominal tenderness. There is no guarding or rebound.  Musculoskeletal:        General: No tenderness.  Skin:    General: Skin is warm and dry.     Findings: No rash.  Neurological:     Mental Status: She is alert.     Cranial Nerves: No cranial nerve deficit (no facial droop, extraocular movements intact, no slurred speech).     Sensory: No sensory deficit.     Motor: No abnormal muscle tone or seizure activity.     Coordination: Coordination normal.      ED Treatments / Results  Labs (all labs ordered are listed, but  only abnormal results are displayed) Labs Reviewed  CBC - Abnormal; Notable for the following components:      Result Value   WBC 2.4 (*)    RBC 3.03 (*)    Hemoglobin 9.4 (*)    HCT 29.9 (*)    RDW 18.6 (*)    nRBC 2.1 (*)    All other components within normal limits  BASIC METABOLIC PANEL - Abnormal; Notable for the following components:   Glucose, Bld 101 (*)    Calcium 8.7 (*)    All other components within normal limits  BRAIN NATRIURETIC PEPTIDE - Abnormal; Notable for the following components:   B Natriuretic Peptide 254.0 (*)    All other components within normal limits  PROTIME-INR - Abnormal; Notable for the following components:   Prothrombin Time 25.6 (*)    All other components within normal limits  TROPONIN I     Radiology Dg Chest 2 View  Result Date: 04/12/2018 CLINICAL DATA:  Shortness of breath. EXAM: CHEST - 2 VIEW COMPARISON:  Radiographs of August 19, 2018. FINDINGS: The heart size and mediastinal contours are within normal limits. Right internal jugular Port-A-Cath is unchanged in position. No pneumothorax is noted. Right lung is clear. Minimal left basilar subsegmental atelectasis is noted. No significant pleural effusion is noted. The visualized skeletal structures are unremarkable. IMPRESSION: Minimal left basilar subsegmental atelectasis. Electronically Signed   By: Marijo Conception, M.D.   On: 04/12/2018 13:43    Procedures Procedures (including critical care time)  Medications Ordered in ED Medications  sodium chloride 0.9 % bolus 500 mL (0 mLs Intravenous Stopped 04/12/18 1235)    Followed by  0.9 %  sodium chloride infusion (1,000 mLs Intravenous New Bag/Given 04/12/18  1118)  ipratropium-albuterol (DUONEB) 0.5-2.5 (3) MG/3ML nebulizer solution 3 mL (3 mLs Nebulization Given 04/12/18 1134)  methylPREDNISolone sodium succinate (SOLU-MEDROL) 125 mg/2 mL injection 125 mg (125 mg Intravenous Given 04/12/18 1119)     Initial Impression / Assessment and Plan  / ED Course  I have reviewed the triage vital signs and the nursing notes.  Pertinent labs & imaging results that were available during my care of the patient were reviewed by me and considered in my medical decision making (see chart for details).   Patient presented to the emergency room for evaluation of shortness of breath.  Patient has history of multiple medical problems including esophageal CA.  She also does continue to smoke and has a history of COPD.  Patient had a significant coughing spell this morning.  She did improve significantly prior to arrival in the emergency room.  Her ED work-up is reassuring.  Chest x-ray does not show pneumonia.  Labs are unremarkable.  I doubt pulmonary embolism with her therapeutic INR.  I suspect she did have a component of bronchospasm and mucus plugging today.  She did have some wheezing on my initial exam.  Patient was monitored in the emergency room.  She had no further episodes.  She feels ready for discharge.  Final Clinical Impressions(s) / ED Diagnoses   Final diagnoses:  COPD exacerbation [facility])  Bronchitis    ED Discharge Orders    None       Dorie Rank, MD 04/12/18 1520

## 2018-04-12 NOTE — Discharge Instructions (Addendum)
Continue your current medications, follow-up with your oncology doctor as planned, return as needed for worsening symptoms

## 2018-04-13 ENCOUNTER — Telehealth: Payer: Self-pay

## 2018-04-13 NOTE — Telephone Encounter (Signed)
Patient's daughter calls just to give Dr. Burr Medico an update. Yesterday patient had episode of feeling like she couldn't breath, was transported to [facility], given two breathing treatments, approximately a liter of IV fluids, chest x-ray was negative, was given steroid.    Feeling some better this morning. Has appointment on Friday 1/31 for IVF.  Dr. Burr Medico made aware.

## 2018-04-14 ENCOUNTER — Encounter: Payer: Self-pay | Admitting: Radiation Oncology

## 2018-04-14 ENCOUNTER — Inpatient Hospital Stay: Payer: Medicare Other

## 2018-04-14 ENCOUNTER — Other Ambulatory Visit: Payer: Self-pay | Admitting: Hematology

## 2018-04-14 VITALS — BP 123/63 | HR 75 | Temp 97.9°F | Resp 18

## 2018-04-14 DIAGNOSIS — C7931 Secondary malignant neoplasm of brain: Secondary | ICD-10-CM | POA: Diagnosis not present

## 2018-04-14 DIAGNOSIS — C50911 Malignant neoplasm of unspecified site of right female breast: Secondary | ICD-10-CM | POA: Diagnosis not present

## 2018-04-14 DIAGNOSIS — C159 Malignant neoplasm of esophagus, unspecified: Secondary | ICD-10-CM | POA: Diagnosis not present

## 2018-04-14 DIAGNOSIS — Z95828 Presence of other vascular implants and grafts: Secondary | ICD-10-CM

## 2018-04-14 DIAGNOSIS — Z5111 Encounter for antineoplastic chemotherapy: Secondary | ICD-10-CM | POA: Diagnosis not present

## 2018-04-14 DIAGNOSIS — I82432 Acute embolism and thrombosis of left popliteal vein: Secondary | ICD-10-CM | POA: Diagnosis not present

## 2018-04-14 DIAGNOSIS — I824Z2 Acute embolism and thrombosis of unspecified deep veins of left distal lower extremity: Secondary | ICD-10-CM

## 2018-04-14 DIAGNOSIS — J209 Acute bronchitis, unspecified: Secondary | ICD-10-CM | POA: Diagnosis not present

## 2018-04-14 MED ORDER — HYDROCODONE-ACETAMINOPHEN 7.5-325 MG/15ML PO SOLN: 7.5000 mL | Freq: Three times a day (TID) | ORAL | 0 refills | Status: DC | PRN

## 2018-04-14 MED ORDER — SODIUM CHLORIDE 0.9 % IV SOLN
Freq: Once | INTRAVENOUS | Status: AC
  Administered 2018-04-14: 09:00:00 via INTRAVENOUS
  Filled 2018-04-14: qty 250

## 2018-04-14 MED ORDER — SODIUM CHLORIDE 0.9% FLUSH
10.0000 mL | Freq: Once | INTRAVENOUS | Status: AC | PRN
  Administered 2018-04-14: 10 mL
  Filled 2018-04-14: qty 10

## 2018-04-14 MED ORDER — HEPARIN SOD (PORK) LOCK FLUSH 100 UNIT/ML IV SOLN
500.0000 [IU] | Freq: Once | INTRAVENOUS | Status: AC | PRN
  Administered 2018-04-14: 500 [IU]
  Filled 2018-04-14: qty 5

## 2018-04-14 MED FILL — HYDROCOD-APAP 7.5-325/15ML: 7.5-325 | 11 days supply | Qty: 250 | Fill #0

## 2018-04-14 NOTE — Progress Notes (Signed)
  Radiation Oncology         [phone] ________________________________  Name: Kristin Williams MRN: [id number]  Date: 04/14/2018  DOB: 09-17-1945  End of Treatment Note  Diagnosis:   Cancer Staging 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, estrogen receptor positive ([place]) Staging form: Breast, AJCC 8th Edition - Clinical stage from 01/10/2018: Stage IA (cT1b, cN0, cM0, G2, ER+, PR+, HER2-) - Signed by [name], MD on 02/03/2018  Indication for treatment:  Palliative       Radiation treatment dates:   02/27/2018-04/07/2018  Site/dose:     Esophagus, 1.8 Gy x 28 fractions for a total dose of 50.4 Gy.   Beams/energy:   IMRT / 10X, 15X, 6X  Narrative: The patient tolerated radiation treatment relatively well. At the beginning of her treatment, she denied any complaints. Towards the end of her treatment, it was noted, redness to her upper mid chest (using sonafine BID), and burning sensation to throat  causing issues with eating (consuming soft foods with gravy). On PE, it was noted erythema and a small scab over right chest just lateral to the sternum and some tanning over right middle chest. Tanning noted throughout center of back and over right scalpula.   Plan: The patient has completed radiation treatment. The patient will return to radiation oncology clinic for routine followup in one half month. I advised the patient to call or return sooner if any questions or concerns arise that are related to recovery or treatment.  -----------------------------------  Eppie Gibson, MD  This document serves as a record of services personally performed by Eppie Gibson, MD. It was created on her behalf by Steva Colder, a trained medical scribe. The creation of this record is based on the scribe's personal observations and the provider's statements to them. This document has been checked and approved by the attending provider.

## 2018-04-14 NOTE — Patient Instructions (Signed)
Dehydration, Adult  Dehydration is a condition in which there is not enough fluid or water in the body. This happens when you lose more fluids than you take in. Important organs, such as the kidneys, brain, and heart, cannot function without a proper amount of fluids. Any loss of fluids from the body can lead to dehydration. Dehydration can range from mild to severe. This condition should be treated right away to prevent it from becoming severe. What are the causes? This condition may be caused by:  Vomiting.  Diarrhea.  Excessive sweating, such as from heat exposure or exercise.  Not drinking enough fluid, especially: ? When ill. ? While doing activity that requires a lot of energy.  Excessive urination.  Fever.  Infection.  Certain medicines, such as medicines that cause the body to lose excess fluid (diuretics).  Inability to access safe drinking water.  Reduced physical ability to get adequate water and food. What increases the risk? This condition is more likely to develop in people:  Who have a poorly controlled long-term (chronic) illness, such as diabetes, heart disease, or kidney disease.  Who are age 65 or older.  Who are disabled.  Who live in a place with high altitude.  Who play endurance sports. What are the signs or symptoms? Symptoms of mild dehydration may include:  Thirst.  Dry lips.  Slightly dry mouth.  Dry, warm skin.  Dizziness. Symptoms of moderate dehydration may include:  Very dry mouth.  Muscle cramps.  Dark urine. Urine may be the color of tea.  Decreased urine production.  Decreased tear production.  Heartbeat that is irregular or faster than normal (palpitations).  Headache.  Light-headedness, especially when you stand up from a sitting position.  Fainting (syncope). Symptoms of severe dehydration may include:  Changes in skin, such as: ? Cold and clammy skin. ? Blotchy (mottled) or pale skin. ? Skin that does  not quickly return to normal after being lightly pinched and released (poor skin turgor).  Changes in body fluids, such as: ? Extreme thirst. ? No tear production. ? Inability to sweat when body temperature is high, such as in hot weather. ? Very little urine production.  Changes in vital signs, such as: ? Weak pulse. ? Pulse that is more than 100 beats a minute when sitting still. ? Rapid breathing. ? Low blood pressure.  Other changes, such as: ? Sunken eyes. ? Cold hands and feet. ? Confusion. ? Lack of energy (lethargy). ? Difficulty waking up from sleep. ? Short-term weight loss. ? Unconsciousness. How is this diagnosed? This condition is diagnosed based on your symptoms and a physical exam. Blood and urine tests may be done to help confirm the diagnosis. How is this treated? Treatment for this condition depends on the severity. Mild or moderate dehydration can often be treated at home. Treatment should be started right away. Do not wait until dehydration becomes severe. Severe dehydration is an emergency and it needs to be treated in a hospital. Treatment for mild dehydration may include:  Drinking more fluids.  Replacing salts and minerals in your blood (electrolytes) that you may have lost. Treatment for moderate dehydration may include:  Drinking an oral rehydration solution (ORS). This is a drink that helps you replace fluids and electrolytes (rehydrate). It can be found at pharmacies and retail stores. Treatment for severe dehydration may include:  Receiving fluids through an IV tube.  Receiving an electrolyte solution through a feeding tube that is passed through your nose and   into your stomach (nasogastric tube, or NG tube).  Correcting any abnormalities in electrolytes.  Treating the underlying cause of dehydration. Follow these instructions at home:  If directed by your health care provider, drink an ORS: ? Make an ORS by following instructions on the  package. ? Start by drinking small amounts, about  cup (120 mL) every 5-10 minutes. ? Slowly increase how much you drink until you have taken the amount recommended by your health care provider.  Drink enough clear fluid to keep your urine clear or pale yellow. If you were told to drink an ORS, finish the ORS first, then start slowly drinking other clear fluids. Drink fluids such as: ? Water. Do not drink only water. Doing that can lead to having too little salt (sodium) in the body (hyponatremia). ? Ice chips. ? Fruit juice that you have added water to (diluted fruit juice). ? Low-calorie sports drinks.  Avoid: ? Alcohol. ? Drinks that contain a lot of sugar. These include high-calorie sports drinks, fruit juice that is not diluted, and soda. ? Caffeine. ? Foods that are greasy or contain a lot of fat or sugar.  Take over-the-counter and prescription medicines only as told by your health care provider.  Do not take sodium tablets. This can lead to having too much sodium in the body (hypernatremia).  Eat foods that contain a healthy balance of electrolytes, such as bananas, oranges, potatoes, tomatoes, and spinach.  Keep all follow-up visits as told by your health care provider. This is important. Contact a health care provider if:  You have abdominal pain that: ? Gets worse. ? Stays in one area (localizes).  You have a rash.  You have a stiff neck.  You are more irritable than usual.  You are sleepier or more difficult to wake up than usual.  You feel weak or dizzy.  You feel very thirsty.  You have urinated only a small amount of very dark urine over 6-8 hours. Get help right away if:  You have symptoms of severe dehydration.  You cannot drink fluids without vomiting.  Your symptoms get worse with treatment.  You have a fever.  You have a severe headache.  You have vomiting or diarrhea that: ? Gets worse. ? Does not go away.  You have blood or green matter  (bile) in your vomit.  You have blood in your stool. This may cause stool to look black and tarry.  You have not urinated in 6-8 hours.  You faint.  Your heart rate while sitting still is over 100 beats a minute.  You have trouble breathing. This information is not intended to replace advice given to you by your health care provider. Make sure you discuss any questions you have with your health care provider. Document Released: 03/01/2005 Document Revised: 09/26/2015 Document Reviewed: 04/25/2015 Elsevier Interactive Patient Education  2019 [employer]  

## 2018-04-17 NOTE — Progress Notes (Signed)
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    01/30/2018 Initial Diagnosis    Metastasis to brain [facility])    01/31/2018 Imaging    MRI Brain W WO Contrast 01/31/18  IMPRESSION: 3 Tesla study does not disclose any additional lesions. Solitary metastasis in the right superior cerebellum measuring 2.9 x 2.6 x 3.0 cm with mild surrounding edema. 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    02/03/2018 - 02/08/2018 Radiation Therapy    SBRT with Dr. Isidore Moos on 02/03/18, 02/06/18, 02/08/18     Esophageal cancer, stage IV ([place])   01/08/2018 Imaging    CT AP W Constrast 01/08/18  IMPRESSION: 1. Nonobstructed, nondistended bowel. No acute inflammatory process. 2. Moderate-sized hiatal hernia noted. 3. Low-density 1.7 cm left adrenal nodule likely to represent a small adenoma. Lower pole left renal cyst measuring 1.8 cm. 4. Moderate aortic atherosclerosis. 5. Lumbar spondylosis and facet arthropathy.    01/27/2018 Imaging    CT Chest W Contrast 01/27/18  IMPRESSION: 1. Large mass involving the distal half of the esophagus spanning approximately 11.7 cm is identified compatible with primary esophageal  neoplasm. 2. Small posterior mediastinal and high right paratracheal lymph nodes are noted which may represent foci of metastatic adenopathy. 3. The small pulmonary nodules in the right lung are unchanged from 01/15/2016 and are favored to represent a benign process. 4. Aortic Atherosclerosis (ICD10-I70.0) and Emphysema (ICD10-J43.9). 5. Coronary artery atherosclerotic calcifications.    01/30/2018 Initial Diagnosis    Esophageal cancer, stage IV ([place])    02/03/2018 - 02/08/2018 Radiation Therapy    SBRT to oligo brain metastasis, in 3 fractions, under Dr. Isidore Moos     02/16/2018 PET scan    PET 02/16/18 IMPRESSION: 1. A 15.7 cm in length esophageal mass extends from the aortic arch level down to the distal esophagus, maximum SUV 22.4, compatible with malignancy. There is a small right lower neck level IV lymph node with lower than mediastinal blood pool activity, and a right upper paratracheal lymph node with just above mediastinal blood pool activity. 2. Faint speckled heterogeneity in the liver, for example in the lateral segment left hepatic lobe, is likely incidental/benign given the lack of correlate of CT finding. It might be prudent to obtain an MRI just to make sure that there is not a small early metastatic lesion in this vicinity. 3. Known right cerebellar vermian metastatic lesion, with expected hypo activity compared to the surrounding brain activity. 4. The right middle lobe pulmonary nodule measures 1.1 by 1.0 cm and is mildly hypermetabolic with maximum SUV 2.6. This lesion measured 6 by 5 mm back on 12/06/2003 and 1.2 by 1.1 cm on 10/15/2015. I am uncertain what to make of the very slow growth and mildly  accentuated metabolic activity. This could be a chronic granulomatous process. Low-grade adenocarcinoma of the lung seems unlikely to be so indolent as to only mildly increase over a 14 year period, but is presumably a differential diagnostic  consideration. Surveillance is suggested. 5. Other imaging findings of potential clinical significance: Aortic Atherosclerosis (ICD10-I70.0). Descending and sigmoid colon diverticulosis. Gas in the urinary bladder, most common cause would be recent catheterization. Minimal chronic left ethmoid sinusitis.    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 Ultrasound-guided biopsy is recommended.    01/10/2018 Initial Biopsy    Diagnsis from Breast Biopsy 01/10/18  Breast, right, needle core biopsy, 6 o'clock - INVASIVE DUCTAL CARCINOMA WITH CALCIFICATIONS, SEE COMMENT. - DUCTAL CARCINOMA IN SITU. 1 of 2 FINAL for Kristin Williams [phone]) Microscopic Comment The carcinoma appears grade 2. Prognostic markers will be ordered. Dr. [name] has reviewed the case. The case was called to [place] on 01/11/2018.    01/10/2018 Receptors her2    The tumor cells are NEGATIVE for Her2 (1+). Estrogen Receptor: 100%, POSITIVE, STRONG STAINING INTENSITY Progesterone Receptor: 100%, POSITIVE, STRONG STAINING INTENSITY Proliferation Marker Ki67: 15%    01/10/2018 Cancer Staging    Staging form: Breast, AJCC 8th Edition - Clinical stage from 01/10/2018: Stage IA (cT1b, cN0, cM0, G2, ER+, PR+, HER2-) - Signed by [name], MD on 02/03/2018    01/30/2018 Initial Diagnosis    Breast cancer ([place])      CURRENT THERAPY:  Anastrozole 31m daily starting 04/2018  INTERVAL HISTORY:  Kristin WITTINGis here for a  follow up. She presented to the ED on 04/12/18 due to shortness of breath for COPD exacerbation. She presents to the clinic today with her family. She notes she is feeling better. She has "lump" feeling in her throat but she is still eating better. She does not like taking nutritional supplements or milk shakes. She notes her teeth is very sensitive the to cold and plans to see Dentist next week. She will also get her tooth pulled, which is already lose. She knows she will have to hold Coumadin. She notes occasional epistaxis but no other signs of bleeding.  She notes her stool is gooey, yellow.  She notes in the last 2 week she has developed less control of her right hand upper extremity and her continued slur in speech. She note she had a hysterectomy at 89and was on hormonal therapy. Her menopause was significant for her. She later had her first breast cancer.     REVIEW OF SYSTEMS:   Constitutional: Denies fevers, chills or abnormal weight loss (+) Improved appetite, mild weight loss Eyes: Denies blurriness of vision Ears, nose, mouth, throat, and face: Denies mucositis or sore throat (+) Dysphagia improved (+) Loose tooth (+) Occasional epistaxis  Respiratory: Denies cough, dyspnea or wheezes Cardiovascular: Denies palpitation, chest discomfort or lower extremity swelling Gastrointestinal:  Denies nausea, heartburn or change in bowel habits (+) Loose, yellow stool  Skin: Denies abnormal skin rashes Lymphatics: Denies new lymphadenopathy or easy bruising  Neurological:Denies numbness, tingling or new weaknesses (+) Less right upper extremity control and persistent slur in speech  Behavioral/Psych: Mood is stable, no new changes  All other systems were reviewed with the patient and are negative.  MEDICAL HISTORY:  Past Medical History:  Diagnosis Date  . Arthritis   . Cancer (HCC)    Cervical  . Cancer ([place])    Breast  . Cancer ([place])    Vaginal  . Complication of anesthesia   . COPD  (chronic obstructive pulmonary disease) ([place])   . Headache(784.0)   . Hyperlipidemia   . Leg pain   . Peripheral vascular disease ([place])   . Personal history of chemotherapy   . Personal history of radiation therapy   . PONV (postoperative nausea and vomiting)     SURGICAL HISTORY: Past Surgical History:  Procedure Laterality Date  . ABDOMINAL HYSTERECTOMY  1975  . BREAST LUMPECTOMY  2005  . GANGLION CYST EXCISION  1977  . ILIAC ARTERY STENT  04/20/2004   CSD right iliac occlusive disease  . IR IMAGING GUIDED PORT INSERTION  02/22/2018    I have reviewed the social history and family history with the patient and they are unchanged from previous note.  ALLERGIES:  is allergic to flonase [fluticasone propionate].  MEDICATIONS:  Current Outpatient Medications  Medication Sig Dispense Refill  . acetaminophen (TYLENOL) 160 MG/5ML liquid Take 325 mg by mouth every 4 (four) hours as needed for fever.    Marland Kitchen dexamethasone (DECADRON) 4 MG tablet Take 1 tablet (4 mg total) by mouth daily. Take 4 mg daily for 2 to 3 days after chemotherapy treatment 30 tablet 1  . dextromethorphan-guaiFENesin (MUCINEX DM) 30-600 MG 12hr tablet Take 1 tablet by mouth as needed for cough.    . furosemide (LASIX) 20 MG tablet Take 1 tablet (20 mg total) by mouth every other day. 15 tablet 0  . HYDROcodone-acetaminophen (HYCET) 7.5-325 mg/15 ml solution Take 7.5 mLs by mouth every 8 (eight) hours as needed for moderate pain. 250 mL 0  . HYDROcodone-homatropine (HYCODAN) 5-1.5 MG/5ML syrup Take 5 mLs by mouth every 6 (six) hours as needed for cough. 120 mL 0  . lidocaine (XYLOCAINE) 2 % solution Patient: Mix 1part 2% viscous lidocaine, 1part H20. Swallow 35m of diluted mixture, 217m before meals and at bedtime, up to QID 100 mL 5  . lidocaine-prilocaine (EMLA) cream Apply 1 application topically as needed. 30 g 1  . metoCLOPramide (REGLAN) 10 MG tablet Take 1 tablet (10 mg total) by mouth every 6 (six) hours as  needed for nausea. 60 tablet 2  . omeprazole (PRILOSEC) 20 MG capsule Take 40 mg by mouth daily.     . ondansetron (ZOFRAN) 8 MG tablet Take 1 tablet (8 mg total) by mouth every 8 (eight) hours as needed for nausea or vomiting. 20 tablet 0  . potassium chloride 20 MEQ/15ML (10%) SOLN Take 15 mLs by mouth daily.    . sucralfate (CARAFATE) 1 g tablet Dissolve 1 tablet in 10 mL H20 and swallow up to QID to soothe throat. 40 tablet 5  . warfarin (COUMADIN) 2.5 MG tablet Take 2 tablets once daily with supper and then as directed by MD 90 tablet 2  . anastrozole (ARIMIDEX) 1 MG tablet Take 1 tablet (1 mg total) by mouth daily. 30 tablet 2  . enoxaparin (LOVENOX) 150 MG/ML injection Inject 0.93 mLs (140 mg total) into the skin daily for 2 days. 12/21 and 12/22 2 Syringe 0  .  LORazepam (ATIVAN) 1 MG tablet Take 1 tablet (1 mg total) by mouth 3 (three) times daily as needed for anxiety. (Patient not taking: Reported on 04/19/2018) 15 tablet 0  . Morphine Sulfate (MORPHINE CONCENTRATE) 10 mg / 0.5 ml concentrated solution Take 0.5 mLs (10 mg total) by mouth every 6 (six) hours as needed for severe pain. (Patient not taking: Reported on 04/19/2018) 30 mL 0   No current facility-administered medications for this visit.     PHYSICAL EXAMINATION: ECOG PERFORMANCE STATUS: 2 - Symptomatic, <50% confined to bed  Vitals:   04/19/18 1050  BP: 133/72  Pulse: 95  Resp: 18  Temp: 98 F (36.7 C)  SpO2: 99%   Filed Weights   04/19/18 1050  Weight: 195 lb 1.6 oz (88.5 kg)    GENERAL:alert, no distress and comfortable SKIN: skin color, texture, turgor are normal, no rashes or significant lesions EYES: normal, Conjunctiva are pink and non-injected, sclera clear OROPHARYNX:no exudate, no erythema and lips, buccal mucosa, and tongue normal  NECK: supple, thyroid normal size, non-tender, without nodularity LYMPH:  no palpable lymphadenopathy in the cervical, axillary or inguinal LUNGS: clear to auscultation and  percussion with normal breathing effort HEART: regular rate & rhythm and no murmurs and no lower extremity edema ABDOMEN:abdomen soft, non-tender and normal bowel sounds Musculoskeletal:no cyanosis of digits and no clubbing  NEURO: alert & oriented x 3 with fluent speech, no focal motor/sensory deficits  LABORATORY DATA:  I have reviewed the data as listed CBC Latest Ref Rng & Units 04/19/2018 04/12/2018 04/10/2018  WBC 4.0 - 10.5 K/uL 7.9 2.4(L) 2.4(L)  Hemoglobin 12.0 - 15.0 g/dL 10.8(L) 9.4(L) 10.7(L)  Hematocrit 36.0 - 46.0 % 33.1(L) 29.9(L) 32.2(L)  Platelets 150 - 400 K/uL 175 198 216     CMP Latest Ref Rng & Units 04/19/2018 04/12/2018 04/10/2018  Glucose 70 - 99 mg/dL 106(H) 101(H) 119(H)  BUN 8 - 23 mg/dL _0 Creatinine 0.44 - 1.00 mg/dL 0.74 0.66 0.79  Sodium 135 - 145 mmol/L 140 136 135  Potassium 3.5 - 5.1 mmol/L 3.2(L) 3.7 4.0  Chloride 98 - 111 mmol/L 104 104 102  CO2 22 - 32 mmol/L _1 Calcium 8.9 - 10.3 mg/dL 9.2 8.7(L) 9.6  Total Protein 6.5 - 8.1 g/dL 6.5 - 6.4(L)  Total Bilirubin 0.3 - 1.2 mg/dL 0.6 - 0.8  Alkaline Phos 38 - 126 U/L 152(H) - 151(H)  AST 15 - 41 U/L 17 - 13(L)  ALT 0 - 44 U/L 17 - 17      RADIOGRAPHIC STUDIES: I have personally reviewed the radiological images as listed and agreed with the findings in the report. No results found.   ASSESSMENT & PLAN:  Kristin Williams is a 73 y.o. female with    1. EsophagealAdenocarcinoma,with oligo met incerebellum, stage IV  -She was diagnosed with esophogeal cancerin 01/2018,with oligometastasisto the brain. Given her stage IV disease her cancer is not curable but still treatable. Goal of care it to control her disease.  -She completedSBRTto her brain meton11/27/2019 and completed chemoRT to primary tumor on 04/07/18.  -She is recovering from treatment well. Dysphagia,odynophagia,nausea mostly resolved and she is able to eat better. I encouraged her to increase her fluid intake.   -Labs reviewed, Hg at 10.8, K ay 3.2, Glucose at 106, Alk Phos at 152.  -She will proceed with IV infusion today and next week  -I briefly discussed her next treatment option after recovering is consolidated chemo with  FOLFOX. Will discuss further at next treatement.  -plan to repeat PET in 3 months  -F/u in 1 month  2. Dysphagia,odynophagia,nausea, low appetite  -secondary toher esophageal cancer and chemoradiation. -She does not like ensure or boost. She will try carnation breakfast and juicing her food. -She does not tolerate Compazine and Zofran gives no relief.  -She takes Prilosec for her Acid reflux.  -She has tried Lidocaine,Sulcrafate, magicmouthwash, liquid hydrocodone and liquid morphine.  -Her nausea is stable with Reglan and ativan.  -Her Odynophagiais overall resolved now and has not needed pain medication in the last few days.    3. RightBreastInvasive Ductal Carcinoma,cT1bN0M0 stage IA,Grade II, ER/PR Positive, HER 2 negative -Diagnosed through screening mammogram, same time as her esophageal cancer in October 2019.  -Given her early stage breast cancer, I previously discussedher breast cancer treatment is less urgent than esophogeal cancertreatment. -Given her EP/PR breast cancer and post-menopausal status, I recommend antiestrogen therapy with anastrozole. The potential benefit and side effects were discussed with her in great details. She is interested and I will prescribe this today. Plan to start in the next 2 weeks -If she is doing well on Anastrozole for 1 year, no cancer progression on esophageal cancer, we can consider breast surgery.    4.Oligo brain mets -This is likely from her primaryesophogealcancer.  -She does have balance issues and ambulates with assistance. She knows she is at risk for fall and that she should be careful. -She is s/p SBRT. Her mild slurred speech, right sided weaknessand balance stabilityhas muchimproved -Will  monitor with brain scan every 3 months.Next scan 05/08/18.  -Over the last 2 weeks she has developed less control of right upper extremity and persistent slurred speech.  -Will get brain scan sooner and we can discuss another round of SBRT.  -She will continue low dose Dexa 43m daily until scan.   5. H/o Left breast cancer, ER/PR negative, treated by Dr. MJana Hakimwith lumpectomy, adj, chemo and radiation. -She was previously referred to genetics  -no evidence of recurrence    6.Acute deep vein thrombosis (DVT) of popliteal vein of left lower extremity, diagnosed on 02/28/2018  -She was found to have left lower extremity edema on his first day of chemoradiation, Doppler showed DVT -Due to the risk of GI bleeding and intracranial hemorrhage from brain metastasis, will start her on Coumadin with Lovenox bridging,doseadjust as needed. -Her tooth is very loose. Given she is off chemo, she is fine to see dentist soon for extraction.She will contact when she gets date.  -INR has stabilized at 2.31 today (04/19/18). She will continue 559mevery day.    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  -Stable  8.Goal of care discussion  -she is full code now -Patient understand her treatment is palliative to prolong her life, her cancer is not curable unfortunately.  9.COPD -She has chronic cough -She presented to ED for COPD excerebration on 04/12/18. She has recovered and her breathing is improved.   10. Peripheral swelling -She has developed bilateral edema on her legs and hands -I strongly encouragedher to elevate her legs at home  -Her swelling continues to improve. She is no longer on lasix.   11. Hypokalemia -Onoral potassium 20 mEq daily. -K slightly decreased to 3.2 today (04/19/18). She will continue oral Potassium and she is fine to crush pill to  be able to swallow it.  PLAN: -I prescribed her anastrozole today, plan to start in the next 2 weeks -IV Fluids today  -Lab, flush and f/u in 3-4 weeks  -IVF 2hr next week -I spoke with Dr. Isidore Moos, will move her brain MRI up to next week    No problem-specific Assessment & Plan notes found for this encounter.   No orders of the defined types were placed in this encounter.  All questions were answered. The patient knows to call the clinic with any problems, questions or concerns. No barriers to learning was detected. I spent 20 minutes counseling the patient face to face. The total time spent in the appointment was 25 minutes and more than 50% was on counseling and review of test results     [name], MD 04/19/2018   I, Joslyn Devon, am acting as scribe for [name], MD.   I have reviewed the above documentation for accuracy and completeness, and I agree with the above.

## 2018-04-19 ENCOUNTER — Telehealth: Payer: Self-pay

## 2018-04-19 ENCOUNTER — Inpatient Hospital Stay: Payer: Medicare Other

## 2018-04-19 ENCOUNTER — Inpatient Hospital Stay: Payer: Medicare Other | Attending: Hematology | Admitting: Hematology

## 2018-04-19 VITALS — BP 133/72 | HR 95 | Temp 98.0°F | Resp 18 | Ht 60.0 in | Wt 195.1 lb

## 2018-04-19 DIAGNOSIS — C159 Malignant neoplasm of esophagus, unspecified: Secondary | ICD-10-CM

## 2018-04-19 DIAGNOSIS — C7931 Secondary malignant neoplasm of brain: Secondary | ICD-10-CM | POA: Insufficient documentation

## 2018-04-19 DIAGNOSIS — I82432 Acute embolism and thrombosis of left popliteal vein: Secondary | ICD-10-CM | POA: Diagnosis not present

## 2018-04-19 DIAGNOSIS — Z90710 Acquired absence of both cervix and uterus: Secondary | ICD-10-CM

## 2018-04-19 DIAGNOSIS — I824Z2 Acute embolism and thrombosis of unspecified deep veins of left distal lower extremity: Secondary | ICD-10-CM

## 2018-04-19 DIAGNOSIS — Z95828 Presence of other vascular implants and grafts: Secondary | ICD-10-CM

## 2018-04-19 DIAGNOSIS — G620 Drug-induced polyneuropathy: Secondary | ICD-10-CM | POA: Diagnosis not present

## 2018-04-19 DIAGNOSIS — C50111 Malignant neoplasm of central portion of right female breast: Secondary | ICD-10-CM

## 2018-04-19 DIAGNOSIS — J449 Chronic obstructive pulmonary disease, unspecified: Secondary | ICD-10-CM | POA: Diagnosis not present

## 2018-04-19 DIAGNOSIS — C50911 Malignant neoplasm of unspecified site of right female breast: Secondary | ICD-10-CM | POA: Insufficient documentation

## 2018-04-19 DIAGNOSIS — Z853 Personal history of malignant neoplasm of breast: Secondary | ICD-10-CM | POA: Diagnosis not present

## 2018-04-19 DIAGNOSIS — Z170 Estrogen receptor positive status [ER+]: Secondary | ICD-10-CM

## 2018-04-19 DIAGNOSIS — Z7189 Other specified counseling: Secondary | ICD-10-CM | POA: Diagnosis not present

## 2018-04-19 DIAGNOSIS — E876 Hypokalemia: Secondary | ICD-10-CM | POA: Insufficient documentation

## 2018-04-19 LAB — CMP (CANCER CENTER ONLY)
ALT: 17 U/L (ref 0–44)
AST: 17 U/L (ref 15–41)
Albumin: 3 g/dL — ABNORMAL LOW (ref 3.5–5.0)
Alkaline Phosphatase: 152 U/L — ABNORMAL HIGH (ref 38–126)
Anion gap: 9 (ref 5–15)
BUN: 14 mg/dL (ref 8–23)
CO2: 27 mmol/L (ref 22–32)
Calcium: 9.2 mg/dL (ref 8.9–10.3)
Chloride: 104 mmol/L (ref 98–111)
Creatinine: 0.74 mg/dL (ref 0.44–1.00)
GFR, Est AFR Am: >60 mL/min (ref >60)
GFR, Estimated: >60 mL/min (ref >60)
Glucose, Bld: 106 mg/dL — ABNORMAL HIGH (ref 70–99)
POTASSIUM: 3.2 mmol/L — AB (ref 3.5–5.1)
Sodium: 140 mmol/L (ref 135–145)
Total Bilirubin: 0.6 mg/dL (ref 0.3–1.2)
Total Protein: 6.5 g/dL (ref 6.5–8.1)

## 2018-04-19 LAB — CBC WITH DIFFERENTIAL (CANCER CENTER ONLY)
Abs Immature Granulocytes: 0.08 10*3/uL — ABNORMAL HIGH (ref 0.00–0.07)
Basophils Absolute: 0 10*3/uL (ref 0.0–0.1)
Basophils Relative: 0 %
Eosinophils Absolute: 0 10*3/uL (ref 0.0–0.5)
Eosinophils Relative: 0 %
HCT: 33.1 % — ABNORMAL LOW (ref 36.0–46.0)
Hemoglobin: 10.8 g/dL — ABNORMAL LOW (ref 12.0–15.0)
Immature Granulocytes: 1 %
Lymphocytes Relative: 3 %
Lymphs Abs: 0.2 10*3/uL — ABNORMAL LOW (ref 0.7–4.0)
MCH: 31 pg (ref 26.0–34.0)
MCHC: 32.6 g/dL (ref 30.0–36.0)
MCV: 95.1 fL (ref 80.0–100.0)
Monocytes Absolute: 0.3 10*3/uL (ref 0.1–1.0)
Monocytes Relative: 4 %
Neutro Abs: 7.3 10*3/uL (ref 1.7–7.7)
Neutrophils Relative %: 92 %
Platelet Count: 175 10*3/uL (ref 150–400)
RBC: 3.48 MIL/uL — AB (ref 3.87–5.11)
RDW: 18.7 % — ABNORMAL HIGH (ref 11.5–15.5)
WBC Count: 7.9 10*3/uL (ref 4.0–10.5)
nRBC: 0 % (ref 0.0–0.2)

## 2018-04-19 LAB — PROTIME-INR
INR: 2.31
PROTHROMBIN TIME: 25.1 s — AB (ref 11.4–15.2)

## 2018-04-19 MED ORDER — DEXAMETHASONE SODIUM PHOSPHATE 10 MG/ML IJ SOLN
5.0000 mg | Freq: Once | INTRAMUSCULAR | Status: AC
  Administered 2018-04-19: 5 mg via INTRAVENOUS

## 2018-04-19 MED ORDER — SODIUM CHLORIDE 0.9 % IV SOLN
Freq: Once | INTRAVENOUS | Status: AC
  Administered 2018-04-19: 14:00:00 via INTRAVENOUS
  Filled 2018-04-19: qty 250

## 2018-04-19 MED ORDER — ONDANSETRON HCL 4 MG/2ML IJ SOLN
8.0000 mg | Freq: Once | INTRAMUSCULAR | Status: AC
  Administered 2018-04-19: 8 mg via INTRAVENOUS

## 2018-04-19 MED ORDER — SODIUM CHLORIDE 0.9% FLUSH
10.0000 mL | Freq: Once | INTRAVENOUS | Status: AC
  Administered 2018-04-19: 10 mL
  Filled 2018-04-19: qty 10

## 2018-04-19 MED ORDER — SODIUM CHLORIDE 0.9% FLUSH
10.0000 mL | Freq: Once | INTRAVENOUS | Status: DC | PRN
  Filled 2018-04-19: qty 10

## 2018-04-19 MED ORDER — SODIUM CHLORIDE 0.9% FLUSH
3.0000 mL | Freq: Once | INTRAVENOUS | Status: DC | PRN
  Filled 2018-04-19: qty 10

## 2018-04-19 MED ORDER — HEPARIN SOD (PORK) LOCK FLUSH 100 UNIT/ML IV SOLN
250.0000 [IU] | Freq: Once | INTRAVENOUS | Status: DC | PRN
  Filled 2018-04-19: qty 5

## 2018-04-19 MED ORDER — ANASTROZOLE 1 MG PO TABS: 1.0000 mg | ORAL_TABLET | Freq: Every day | ORAL | 2 refills | Status: DC

## 2018-04-19 MED ORDER — SODIUM CHLORIDE 0.9 % IV SOLN: Freq: Once | INTRAVENOUS | Status: DC

## 2018-04-19 MED ORDER — ALTEPLASE 2 MG IJ SOLR
2.0000 mg | Freq: Once | INTRAMUSCULAR | Status: DC | PRN
  Filled 2018-04-19: qty 2

## 2018-04-19 MED ORDER — HEPARIN SOD (PORK) LOCK FLUSH 100 UNIT/ML IV SOLN
500.0000 [IU] | Freq: Once | INTRAVENOUS | Status: DC | PRN
  Filled 2018-04-19: qty 5

## 2018-04-19 MED ORDER — HEPARIN SOD (PORK) LOCK FLUSH 100 UNIT/ML IV SOLN
500.0000 [IU] | Freq: Once | INTRAVENOUS | Status: AC
  Administered 2018-04-19: 500 [IU]
  Filled 2018-04-19: qty 5

## 2018-04-19 MED FILL — ANASTROZOLE 1 MG TABLET: 1 | 30 days supply | Qty: 30 | Fill #0

## 2018-04-19 NOTE — Telephone Encounter (Signed)
Printed avs and calender of upcoming appointment. Per 2/5 los 

## 2018-04-19 NOTE — Patient Instructions (Signed)

## 2018-04-20 ENCOUNTER — Encounter: Payer: Self-pay | Admitting: Hematology

## 2018-04-24 ENCOUNTER — Other Ambulatory Visit: Payer: Self-pay | Admitting: Radiation Oncology

## 2018-04-24 DIAGNOSIS — C154 Malignant neoplasm of middle third of esophagus: Secondary | ICD-10-CM

## 2018-04-24 MED ORDER — LIDOCAINE VISCOUS HCL 2 % MT SOLN: OROMUCOSAL | 5 refills | Status: DC

## 2018-04-25 ENCOUNTER — Telehealth: Payer: Self-pay

## 2018-04-25 ENCOUNTER — Other Ambulatory Visit: Payer: Self-pay | Admitting: Radiation Therapy

## 2018-04-25 NOTE — Telephone Encounter (Signed)
Per 2/5 los request 3 hrs iv fluids. Called patient and left a msg concerning her appointment being added to her current schedule. ONLY 2 HRS WAS ADDED

## 2018-04-26 ENCOUNTER — Encounter: Payer: Self-pay | Admitting: Radiation Oncology

## 2018-04-26 ENCOUNTER — Inpatient Hospital Stay: Payer: Medicare Other

## 2018-04-26 VITALS — BP 126/66 | HR 77 | Temp 98.1°F | Resp 18

## 2018-04-26 DIAGNOSIS — I824Z2 Acute embolism and thrombosis of unspecified deep veins of left distal lower extremity: Secondary | ICD-10-CM

## 2018-04-26 DIAGNOSIS — C50911 Malignant neoplasm of unspecified site of right female breast: Secondary | ICD-10-CM | POA: Diagnosis not present

## 2018-04-26 DIAGNOSIS — Z95828 Presence of other vascular implants and grafts: Secondary | ICD-10-CM

## 2018-04-26 DIAGNOSIS — J449 Chronic obstructive pulmonary disease, unspecified: Secondary | ICD-10-CM | POA: Diagnosis not present

## 2018-04-26 DIAGNOSIS — I82432 Acute embolism and thrombosis of left popliteal vein: Secondary | ICD-10-CM | POA: Diagnosis not present

## 2018-04-26 DIAGNOSIS — C159 Malignant neoplasm of esophagus, unspecified: Secondary | ICD-10-CM | POA: Diagnosis not present

## 2018-04-26 DIAGNOSIS — C7931 Secondary malignant neoplasm of brain: Secondary | ICD-10-CM | POA: Diagnosis not present

## 2018-04-26 DIAGNOSIS — E876 Hypokalemia: Secondary | ICD-10-CM | POA: Diagnosis not present

## 2018-04-26 MED ORDER — HEPARIN SOD (PORK) LOCK FLUSH 100 UNIT/ML IV SOLN
500.0000 [IU] | Freq: Once | INTRAVENOUS | Status: AC | PRN
  Administered 2018-04-26: 500 [IU]
  Filled 2018-04-26: qty 5

## 2018-04-26 MED ORDER — SODIUM CHLORIDE 0.9 % IV SOLN
Freq: Once | INTRAVENOUS | Status: AC
  Administered 2018-04-26: 08:00:00 via INTRAVENOUS
  Filled 2018-04-26: qty 250

## 2018-04-26 MED ORDER — SODIUM CHLORIDE 0.9% FLUSH
10.0000 mL | Freq: Once | INTRAVENOUS | Status: AC | PRN
  Administered 2018-04-26: 10 mL
  Filled 2018-04-26: qty 10

## 2018-04-26 NOTE — Patient Instructions (Signed)
Dehydration, Adult  Dehydration is when there is not enough fluid or water in your body. This happens when you lose more fluids than you take in. Dehydration can range from mild to very bad. It should be treated right away to keep it from getting very bad. Symptoms of mild dehydration may include:  Thirst.  Dry lips.  Slightly dry mouth.  Dry, warm skin.  Dizziness. Symptoms of moderate dehydration may include:  Very dry mouth.  Muscle cramps.  Dark pee (urine). Pee may be the color of tea.  Your body making less pee.  Your eyes making fewer tears.  Heartbeat that is uneven or faster than normal (palpitations).  Headache.  Light-headedness, especially when you stand up from sitting.  Fainting (syncope). Symptoms of very bad dehydration may include:  Changes in skin, such as: ? Cold and clammy skin. ? Blotchy (mottled) or pale skin. ? Skin that does not quickly return to normal after being lightly pinched and let go (poor skin turgor).  Changes in body fluids, such as: ? Feeling very thirsty. ? Your eyes making fewer tears. ? Not sweating when body temperature is high, such as in hot weather. ? Your body making very little pee.  Changes in vital signs, such as: ? Weak pulse. ? Pulse that is more than 100 beats a minute when you are sitting still. ? Fast breathing. ? Low blood pressure.  Other changes, such as: ? Sunken eyes. ? Cold hands and feet. ? Confusion. ? Lack of energy (lethargy). ? Trouble waking up from sleep. ? Short-term weight loss. ? Unconsciousness. Follow these instructions at home:   If told by your doctor, drink an ORS: ? Make an ORS by using instructions on the package. ? Start by drinking small amounts, about  cup (120 mL) every 5-10 minutes. ? Slowly drink more until you have had the amount that your doctor said to have.  Drink enough clear fluid to keep your pee clear or pale yellow. If you were told to drink an ORS, finish the  ORS first, then start slowly drinking clear fluids. Drink fluids such as: ? Water. Do not drink only water by itself. Doing that can make the salt (sodium) level in your body get too low (hyponatremia). ? Ice chips. ? Fruit juice that you have added water to (diluted). ? Low-calorie sports drinks.  Avoid: ? Alcohol. ? Drinks that have a lot of sugar. These include high-calorie sports drinks, fruit juice that does not have water added, and soda. ? Caffeine. ? Foods that are greasy or have a lot of fat or sugar.  Take over-the-counter and prescription medicines only as told by your doctor.  Do not take salt tablets. Doing that can make the salt level in your body get too high (hypernatremia).  Eat foods that have minerals (electrolytes). Examples include bananas, oranges, potatoes, tomatoes, and spinach.  Keep all follow-up visits as told by your doctor. This is important. Contact a doctor if:  You have belly (abdominal) pain that: ? Gets worse. ? Stays in one area (localizes).  You have a rash.  You have a stiff neck.  You get angry or annoyed more easily than normal (irritability).  You are more sleepy than normal.  You have a harder time waking up than normal.  You feel: ? Weak. ? Dizzy. ? Very thirsty.  You have peed (urinated) only a small amount of very dark pee during 6-8 hours. Get help right away if:  You have   symptoms of very bad dehydration.  You cannot drink fluids without throwing up (vomiting).  Your symptoms get worse with treatment.  You have a fever.  You have a very bad headache.  You are throwing up or having watery poop (diarrhea) and it: ? Gets worse. ? Does not go away.  You have blood or something green (bile) in your throw-up.  You have blood in your poop (stool). This may cause poop to look black and tarry.  You have not peed in 6-8 hours.  You pass out (faint).  Your heart rate when you are sitting still is more than 100 beats a  minute.  You have trouble breathing. This information is not intended to replace advice given to you by your health care provider. Make sure you discuss any questions you have with your health care provider. Document Released: 12/26/2008 Document Revised: 09/19/2015 Document Reviewed: 04/25/2015 Elsevier Interactive Patient Education  2019 [employer] Inc[employer]  

## 2018-04-26 NOTE — Progress Notes (Signed)
Ms. Mcphie presents for follow up of [place] radiation to her Rt Sup Cerebellum completing 02/08/18 and Esophagus completing 04/07/18. She last saw Dr. Burr Medico 04/19/18 and will see her again on 05/17/18. She will see Sandi Mealy PA in the symptom management clinic on 05/09/18. She had a MRI of her brain on 04/28/18. She continues to report feeling like there is a lump in her chest when swallowing when she does not swallow carefully. She tells me that she is eating and drinking well. She does eat softer foods. She reports a recent twitch to her right eye and right cheek area. She has a chronic drool from the right side of her mouth which she is not always aware of.   BP 130/66 (BP Location: Right Arm)   Pulse 93   Temp 98.4 F (36.9 C) (Oral)   Wt 193 lb 12.8 oz (87.9 kg)   LMP 02/17/2018   SpO2 99% Comment: room air  BMI 37.85 kg/m    Wt Readings from Last 3 Encounters:  05/02/18 193 lb 12.8 oz (87.9 kg)  04/19/18 195 lb 1.6 oz (88.5 kg)  04/12/18 198 lb 6.6 oz (90 kg)

## 2018-04-28 ENCOUNTER — Ambulatory Visit
Admission: RE | Admit: 2018-04-28 | Discharge: 2018-04-28 | Disposition: A | Discharge status: Home / Self Care | Payer: Medicare Other | Source: Ambulatory Visit | Attending: Radiation Oncology | Admitting: Radiation Oncology

## 2018-04-28 DIAGNOSIS — C7931 Secondary malignant neoplasm of brain: Secondary | ICD-10-CM | POA: Diagnosis not present

## 2018-04-28 DIAGNOSIS — C801 Malignant (primary) neoplasm, unspecified: Secondary | ICD-10-CM | POA: Diagnosis not present

## 2018-04-28 DIAGNOSIS — D4989 Neoplasm of unspecified behavior of other specified sites: Secondary | ICD-10-CM

## 2018-04-28 MED ORDER — GADOBENATE DIMEGLUMINE 529 MG/ML IV SOLN
20.0000 mL | Freq: Once | INTRAVENOUS | Status: AC | PRN
  Administered 2018-04-28: 20 mL via INTRAVENOUS

## 2018-05-02 ENCOUNTER — Encounter: Payer: Self-pay | Admitting: Radiation Oncology

## 2018-05-02 ENCOUNTER — Other Ambulatory Visit: Payer: Self-pay

## 2018-05-02 ENCOUNTER — Ambulatory Visit
Admission: RE | Admit: 2018-05-02 | Discharge: 2018-05-02 | Disposition: A | Discharge status: Home / Self Care | Payer: Medicare Other | Source: Ambulatory Visit | Attending: Radiation Oncology | Admitting: Radiation Oncology

## 2018-05-02 VITALS — BP 130/66 | HR 93 | Temp 98.4°F | Wt 193.8 lb

## 2018-05-02 DIAGNOSIS — R531 Weakness: Secondary | ICD-10-CM | POA: Diagnosis not present

## 2018-05-02 DIAGNOSIS — R269 Unspecified abnormalities of gait and mobility: Secondary | ICD-10-CM | POA: Diagnosis not present

## 2018-05-02 DIAGNOSIS — R4781 Slurred speech: Secondary | ICD-10-CM | POA: Diagnosis not present

## 2018-05-02 DIAGNOSIS — Z79899 Other long term (current) drug therapy: Secondary | ICD-10-CM | POA: Insufficient documentation

## 2018-05-02 DIAGNOSIS — J323 Chronic sphenoidal sinusitis: Secondary | ICD-10-CM | POA: Diagnosis not present

## 2018-05-02 DIAGNOSIS — C159 Malignant neoplasm of esophagus, unspecified: Secondary | ICD-10-CM | POA: Insufficient documentation

## 2018-05-02 DIAGNOSIS — C50919 Malignant neoplasm of unspecified site of unspecified female breast: Secondary | ICD-10-CM | POA: Diagnosis not present

## 2018-05-02 DIAGNOSIS — Z809 Family history of malignant neoplasm, unspecified: Secondary | ICD-10-CM | POA: Diagnosis not present

## 2018-05-02 DIAGNOSIS — C7931 Secondary malignant neoplasm of brain: Secondary | ICD-10-CM | POA: Diagnosis not present

## 2018-05-02 DIAGNOSIS — C719 Malignant neoplasm of brain, unspecified: Secondary | ICD-10-CM | POA: Diagnosis not present

## 2018-05-02 DIAGNOSIS — R609 Edema, unspecified: Secondary | ICD-10-CM | POA: Insufficient documentation

## 2018-05-02 DIAGNOSIS — Z7901 Long term (current) use of anticoagulants: Secondary | ICD-10-CM | POA: Diagnosis not present

## 2018-05-02 DIAGNOSIS — D001 Carcinoma in situ of esophagus: Secondary | ICD-10-CM | POA: Diagnosis not present

## 2018-05-02 HISTORY — DX: Personal history of irradiation: Z92.3

## 2018-05-02 MED ORDER — DEXAMETHASONE 4 MG PO TABS: ORAL_TABLET | ORAL | 0 refills | Status: DC

## 2018-05-02 NOTE — Progress Notes (Addendum)
Radiation Oncology         [phone] ________________________________  Name: Kristin Williams MRN: [id number]  Date: 05/02/2018  DOB: 02-26-1946  Follow-Up Visit Note  Outpatient  CC: Lujean Amel, MD  Rolm Bookbinder, MD  Diagnosis and Prior Radiotherapy:    ICD-10-CM   1. 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  The right superior cerebellar metastasis is smaller, now 2.7 x 2.5 x 2.6 cm, compared with 3.0 x 2.6 x 2.9 in November 2019. There is considerable increase in edema of the cerebellum, presumably secondary to treatment. No new metastases.             She last saw Dr. Burr Medico on 04/19/18 and was started on anti-estrogen therapy with anastrozole for her breast cancer. She will also see Sandi Mealy, PA in the symptom management clinic on 05/09/18.   Symptomatically, she continues to report feeling like there is a lump in her chest with swallowing when she does not swallow carefully. She states that she is eating and drinking well, and her swallowing is aided by lidocaine and Carafate. She reports a recent twitch to her right eye and right cheek area. She reports a chronic drool from the right side of her mouth which she is not always aware of. She reports slurred speech that started 3 weeks ago. She reports difficulty locating objects with her right hand. She reports right  lower extremity weakness has returned. She states that her gait is "off" and she is using a walker to ambulate. She denies any dizziness or seizures. She is currently on dexamethasone 4 mg daily but notes no difference in her symptoms.         ALLERGIES:  is allergic to flonase [fluticasone propionate].  Meds: Current Outpatient Medications  Medication Sig Dispense Refill  . acetaminophen (TYLENOL) 160 MG/5ML liquid Take 325 mg by mouth every 4 (four) hours as needed for fever.    Marland Kitchen dexamethasone (DECADRON) 4 MG tablet Taper as instructed 45 tablet 0  . dextromethorphan-guaiFENesin (MUCINEX DM) 30-600 MG 12hr tablet Take 1 tablet by mouth as needed for cough.    . furosemide (LASIX) 20 MG tablet Take 1 tablet (20 mg total) by mouth every other day. 15 tablet 0  . lidocaine (XYLOCAINE) 2 % solution Patient: Mix 1part 2% viscous lidocaine, 1part H20. Swallow 53mL of diluted mixture, 35min before meals and at bedtime, up to QID 100 mL 5  . lidocaine-prilocaine (EMLA) cream Apply 1 application topically as needed. 30 g 1  . LORazepam (ATIVAN) 1 MG tablet Take 1 tablet (1 mg total) by mouth 3 (three) times daily as needed for anxiety. 15 tablet 0  . metoCLOPramide (REGLAN) 10 MG tablet Take 1 tablet (10 mg total) by mouth every 6 (six) hours as needed for nausea. 60 tablet 2  . omeprazole (PRILOSEC) 20 MG capsule Take 40  mg by mouth daily.     . ondansetron (ZOFRAN) 8 MG tablet Take 1 tablet (8 mg total) by mouth every 8 (eight) hours as needed for nausea or vomiting. 20 tablet 0  . potassium chloride 20 MEQ/15ML (10%) SOLN Take 15 mLs by mouth daily.    . sucralfate (CARAFATE) 1 g tablet Dissolve 1 tablet in 10 mL H20 and swallow up to QID to soothe throat. 40 tablet 5  . warfarin (COUMADIN) 2.5 MG tablet Take 2 tablets once daily with supper and then as directed by MD 90 tablet 2  . anastrozole (ARIMIDEX) 1 MG tablet Take 1 tablet (1 mg total) by mouth daily. (Patient not taking: Reported on  05/02/2018) 30 tablet 2  . enoxaparin (LOVENOX) 150 MG/ML injection Inject 0.93 mLs (140 mg total) into the skin daily for 2 days. 12/21 and 12/22 2 Syringe 0  . HYDROcodone-acetaminophen (HYCET) 7.5-325 mg/15 ml solution Take 7.5 mLs by mouth every 8 (eight) hours as needed for moderate pain. (Patient not taking: Reported on 05/02/2018) 250 mL 0  . HYDROcodone-homatropine (HYCODAN) 5-1.5 MG/5ML syrup Take 5 mLs by mouth every 6 (six) hours as needed for cough. (Patient not taking: Reported on 05/02/2018) 120 mL 0  . Morphine Sulfate (MORPHINE CONCENTRATE) 10 mg / 0.5 ml concentrated solution Take 0.5 mLs (10 mg total) by mouth every 6 (six) hours as needed for severe pain. (Patient not taking: Reported on 04/19/2018) 30 mL 0   No current facility-administered medications for this encounter.     Physical Findings: The patient is in no acute distress. Patient is alert and oriented. Uses a walker  weight is 193 lb 12.8 oz (87.9 kg). Her oral temperature is 98.4 F (36.9 C). Her blood pressure is 130/66 and her pulse is 93. Her oxygen saturation is 99%.   HEENT: Head is normocephalic. Extraocular movements are intact. Oral cavity is clear; no thrush. Heart: Regular in rate and rhythm with no murmurs, rubs, or gallops. Chest: Clear to auscultation bilaterally, except for a high-pitched sound in the right base on inspiration. Abdomen: Soft, nontender, nondistended, with no rigidity or guarding. Extremities: No cyanosis or edema. Musculoskeletal: Symmetric strength and muscle tone throughout. Neurologic: Cranial nerves II through XII are grossly intact.  Speech is grossly fluent. Finger-to-nose testing is wobbly with the right hand. Psychiatric: Judgment and insight are intact. Affect is appropriate.    KPS =  70  100 - Normal; no complaints; no evidence of disease. 90   - Able to carry on normal activity; minor signs or symptoms of disease. 80   - Normal activity with effort; some signs or symptoms  of disease. 46   - Cares for self; unable to carry on normal activity or to do active work. 60   - Requires occasional assistance, but is able to care for most of his personal needs. 50   - Requires considerable assistance and frequent medical care. 80   - Disabled; requires special care and assistance. 40   - Severely disabled; hospital admission is indicated although death not imminent. 71   - Very sick; hospital admission necessary; active supportive treatment necessary. 10   - Moribund; fatal processes progressing rapidly. 0     - Dead  Karnofsky DA, Abelmann WH, Craver LS and Burchenal [facility] [phone]) The use of the nitrogen mustards in the palliative treatment of carcinoma: with particular reference to bronchogenic carcinoma Cancer 1 634-56  Lab Findings: Lab Results  Component Value Date   WBC  7.9 04/19/2018   HGB 10.8 (L) 04/19/2018   HCT 33.1 (L) 04/19/2018   MCV 95.1 04/19/2018   PLT 175 04/19/2018    Radiographic Findings: Dg Chest 2 View  Result Date: 04/12/2018 CLINICAL DATA:  Shortness of breath. EXAM: CHEST - 2 VIEW COMPARISON:  Radiographs of August 19, 2018. FINDINGS: The heart size and mediastinal contours are within normal limits. Right internal jugular Port-A-Cath is unchanged in position. No pneumothorax is noted. Right lung is clear. Minimal left basilar subsegmental atelectasis is noted. No significant pleural effusion is noted. The visualized skeletal structures are unremarkable. IMPRESSION: Minimal left basilar subsegmental atelectasis. Electronically Signed   By: Marijo Conception, M.D.   On: 04/12/2018 13:43   Mr Jeri Cos YH Contrast  Result Date: 04/30/2018 CLINICAL DATA:  S RS restaging. Cerebellar metastasis treated with radiation and chemotherapy. Some blurred vision, slurred speech and right-sided weakness. EXAM: MRI HEAD WITHOUT AND WITH CONTRAST TECHNIQUE: Multiplanar, multiecho pulse sequences of the brain and surrounding structures were obtained without and with  intravenous contrast. CONTRAST:  41mL MULTIHANCE GADOBENATE DIMEGLUMINE 529 MG/ML IV SOLN COMPARISON:  CT 02/17/2018.  MRI 01/31/2018. FINDINGS: Brain: Solitary metastatic lesion in the superior cerebellum on the right has gotten smaller, 2.7 x 2.5 x 2.6 cm today compared with 3.0 x 2.6 x 2.9 cm in November. Again this shows a lobular contour with calcification. There is considerably more edema within the superior cerebellum, with more mass effect and flattening of the fourth ventricle, presumably related to treatment. There is minimal increase in size of the lateral ventricles suggesting that there could be a very minimal degree of hydrocephalus. Elsewhere, chronic small-vessel ischemic changes of the cerebral hemispheric white matter appears similar. No evidence recent infarction. No new metastasis is seen. No extra-axial collection. Vascular: Major vessels at the base of the brain show flow. Skull and upper cervical spine: Negative. Benign appearing left posterior frontal vertex enhancement is unchanged. Sinuses/Orbits: Inflammatory change now visible in the right division of the sphenoid sinus. Orbits negative. Other: None IMPRESSION: Right superior cerebellar metastasis is smaller, 2.7 x 2.5 x 2.6 cm today compared with 3.0 x 2.6 x 2.9 cm in November. Considerable increase in edema of the cerebellum, presumably secondary to treatment. Mass-effect upon the fourth ventricle. Slight increase in prominence of the lateral ventricles, suggesting a low level of hydrocephalus. No new metastasis. Right sphenoid sinusitis. Chronic small-vessel changes of the cerebral hemispheric white matter are stable Electronically Signed   By: Nelson Chimes M.D.   On: 04/30/2018 07:11    Impression/Plan:  Edema related to brain SRS  The patient was instructed to start a higher dose of her dexamethasone 4 mg tablet, followed by a taper, as follows: - Take two tablets tonight.  - Starting 2/19, take one tablet three times a day  with food. - Starting 2/26, take one tablet two times a day with food.  - Starting 3/4, take one tablet every morning with food.  - On 3/11, take half a tablet every morning with food.  - Last dose will be on 3/19.  Patient's preferred pharmacy is [facility] in [place]  Referral to Dr. Mickeal Skinner for symptom management.  Referral to physical therapy through home health.  Repeat brain MRI in 3 months. Follow up with Dr. Sherwood Gambler at that time.   Follow up with me in 6 months with repeat brain MRI. The patient will alternate between Dr. Sherwood Gambler and myself for brain MRIs every 3 months for the next year. She  knows to return sooner as needed.  I spent 40 minutes face to face with the patient and more than 50% of that time was spent in counseling and/or coordination of care. _____________________________________   Eppie Gibson, MD  This document serves as a record of services personally performed by Eppie Gibson, MD. It was created on her behalf by Rae Lips, a trained medical scribe. The creation of this record is based on the scribe's personal observations and the provider's statements to them. This document has been checked and approved by the attending provider.

## 2018-05-05 ENCOUNTER — Ambulatory Visit: Payer: Medicare Other | Admitting: Radiation Oncology

## 2018-05-08 ENCOUNTER — Other Ambulatory Visit: Payer: Medicare Other

## 2018-05-08 NOTE — Progress Notes (Signed)
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    01/30/2018 Initial Diagnosis    Metastasis to brain [facility])    01/31/2018 Imaging    MRI Brain W WO Contrast 01/31/18  IMPRESSION: 3 Tesla study does not disclose any additional lesions. Solitary metastasis in the right superior cerebellum measuring 2.9 x 2.6 x 3.0 cm with mild surrounding edema. 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    02/03/2018 - 02/08/2018 Radiation Therapy    SBRT with Dr. Isidore Moos on 02/03/18, 02/06/18, 02/08/18     Esophageal cancer, stage IV ([place])   01/08/2018 Imaging    CT AP W Constrast 01/08/18  IMPRESSION: 1. Nonobstructed, nondistended bowel. No acute inflammatory process. 2. Moderate-sized hiatal hernia noted. 3. Low-density 1.7 cm left adrenal nodule likely to represent a small adenoma. Lower pole left renal cyst measuring 1.8 cm. 4. Moderate aortic atherosclerosis. 5. Lumbar spondylosis and facet arthropathy.    01/27/2018 Imaging    CT Chest W Contrast 01/27/18  IMPRESSION: 1. Large mass involving the distal half of the esophagus spanning approximately 11.7 cm is identified compatible with primary esophageal neoplasm. 2. Small  posterior mediastinal and high right paratracheal lymph nodes are noted which may represent foci of metastatic adenopathy. 3. The small pulmonary nodules in the right lung are unchanged from 01/15/2016 and are favored to represent a benign process. 4. Aortic Atherosclerosis (ICD10-I70.0) and Emphysema (ICD10-J43.9). 5. Coronary artery atherosclerotic calcifications.    01/30/2018 Initial Diagnosis    Esophageal cancer, stage IV ([place])    02/03/2018 - 02/08/2018 Radiation Therapy    SBRT to oligo brain metastasis, in 3 fractions, under Dr. Isidore Moos     02/16/2018 PET scan    PET 02/16/18 IMPRESSION: 1. A 15.7 cm in length esophageal mass extends from the aortic arch level down to the distal esophagus, maximum SUV 22.4, compatible with malignancy. There is a small right lower neck level IV lymph node with lower than mediastinal blood pool activity, and a right upper paratracheal lymph node with just above mediastinal blood pool activity. 2. Faint speckled heterogeneity in the liver, for example in the lateral segment left hepatic lobe, is likely incidental/benign given the lack of correlate of CT finding. It might be prudent to obtain an MRI just to make sure that there is not a small early metastatic lesion in this vicinity. 3. Known right cerebellar vermian metastatic lesion, with expected hypo activity compared to the surrounding brain activity. 4. The right middle lobe pulmonary nodule measures 1.1 by 1.0 cm and is mildly hypermetabolic with maximum SUV 2.6. This lesion measured 6 by 5 mm back on 12/06/2003 and 1.2 by 1.1 cm on 10/15/2015. I am uncertain what to make of the very slow growth and mildly accentuated metabolic activity. This could  be a chronic granulomatous process. Low-grade adenocarcinoma of the lung seems unlikely to be so indolent as to only mildly increase over a 14 year period, but is presumably a differential diagnostic consideration. Surveillance is  suggested. 5. Other imaging findings of potential clinical significance: Aortic Atherosclerosis (ICD10-I70.0). Descending and sigmoid colon diverticulosis. Gas in the urinary bladder, most common cause would be recent catheterization. Minimal chronic left ethmoid sinusitis.    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 Ultrasound-guided biopsy is recommended.    01/10/2018 Initial Biopsy    Diagnsis from Breast Biopsy 01/10/18  Breast, right, needle core biopsy, 6 o'clock - INVASIVE DUCTAL CARCINOMA WITH CALCIFICATIONS, SEE COMMENT. - DUCTAL CARCINOMA IN SITU. 1 of 2 FINAL for SHANEIL, YAZDI [phone]) Microscopic Comment The carcinoma appears grade 2. Prognostic markers will be ordered. Dr. [name] has reviewed the case. The case was called to [place] on 01/11/2018.    01/10/2018 Receptors her2    The tumor cells are NEGATIVE for Her2 (1+). Estrogen Receptor: 100%, POSITIVE, STRONG STAINING INTENSITY Progesterone Receptor: 100%, POSITIVE, STRONG STAINING INTENSITY Proliferation Marker Ki67: 15%    01/10/2018 Cancer Staging    Staging form: Breast, AJCC 8th Edition - Clinical stage from 01/10/2018: Stage IA (cT1b, cN0, cM0, G2, ER+, PR+, HER2-) - Signed by [name], MD on 02/03/2018    01/30/2018 Initial Diagnosis    Breast cancer ([place])    05/07/2018 -  Anti-estrogen oral therapy    Anastrozole 38m daily starting 05/07/2018     CURRENT THERAPY  Anastrozole 132mdaily started  05/07/2018  INTERVAL HISTORY: Kristin SUNDQUISTs [age] who is here for follow-up. Recently she has reported feeling like there is a lump in her chest when she swallows. She recently started anastrozole 1 mg daily. Today, she is here with her husband. She describes that she has weak legs but is going to start PT tomorrow. She has been experiences swelling in her throat and difficulty eating but symptoms are improving. She takes Lidocaine and sucralfate for her swelling. She has a normal appetite but is still losing weight. She is drinking plenty of fluids. She started Anastrozole on 05/07/2018 and denies having hot flashes.   Pertinent positives and negatives of review of systems are listed and detailed within the above HPI.  REVIEW OF SYSTEMS:  Constitutional: Denies fevers, chills or abnormal weight loss, (+) improved appetite, (+) mild weight loss Eyes: Denies blurriness of vision Ears, nose, mouth, throat, and face: Denies mucositis or sore throat, (+) dysphagia improved  Respiratory: Denies cough, dyspnea or wheezes Cardiovascular: Denies palpitation, chest discomfort or lower extremity swelling Gastrointestinal:  Denies nausea, heartburn or change in bowel habits Skin: Denies abnormal skin rashes Lymphatics: Denies new lymphadenopathy or easy bruising Neurological:Denies numbness, tingling or new weaknesses Behavioral/Psych: Mood is stable, no new changes  Musculoskeletal: (+) weak legs  All other systems were reviewed with the patient and are negative.  MEDICAL HISTORY:  Past Medical History:  Diagnosis Date  . Arthritis   . Cancer (  [place])    Cervical  . Cancer ([place])    Breast  . Cancer ([place])    Vaginal  . Complication of anesthesia   . COPD (chronic obstructive pulmonary disease) ([place])   . Headache(784.0)   . History of radiation therapy 02/28/19- 04/07/18   Esophagus, 1.8 Gy in 28 fractions for a total dose of 50.4 Gy.   Marland Kitchen History of radiation therapy 02/03/18-  02/08/18   Brain Rt Sup Cerebellum// 6 FFF photons.   . Hyperlipidemia   . Leg pain   . Peripheral vascular disease ([place])   . Personal history of chemotherapy   . Personal history of radiation therapy   . PONV (postoperative nausea and vomiting)     SURGICAL HISTORY: Past Surgical History:  Procedure Laterality Date  . ABDOMINAL HYSTERECTOMY  1975  . BREAST LUMPECTOMY  2005  . GANGLION CYST EXCISION  1977  . ILIAC ARTERY STENT  04/20/2004   CSD right iliac occlusive disease  . IR IMAGING GUIDED PORT INSERTION  02/22/2018    I have reviewed the social history and family history with the patient and they are unchanged from previous note.  ALLERGIES:  is allergic to flonase [fluticasone propionate].  MEDICATIONS:  Current Outpatient Medications  Medication Sig Dispense Refill  . acetaminophen (TYLENOL) 160 MG/5ML liquid Take 325 mg by mouth every 4 (four) hours as needed for fever.    Marland Kitchen anastrozole (ARIMIDEX) 1 MG tablet Take 1 tablet (1 mg total) by mouth daily. 30 tablet 2  . dexamethasone (DECADRON) 4 MG tablet Taper as instructed 45 tablet 0  . dextromethorphan-guaiFENesin (MUCINEX DM) 30-600 MG 12hr tablet Take 1 tablet by mouth as needed for cough.    . furosemide (LASIX) 20 MG tablet Take 1 tablet (20 mg total) by mouth every other day. 15 tablet 0  . HYDROcodone-acetaminophen (HYCET) 7.5-325 mg/15 ml solution Take 7.5 mLs by mouth every 8 (eight) hours as needed for moderate pain. (Patient not taking: Reported on 05/02/2018) 250 mL 0  . HYDROcodone-homatropine (HYCODAN) 5-1.5 MG/5ML syrup Take 5 mLs by mouth every 6 (six) hours as needed for cough. (Patient not taking: Reported on 05/02/2018) 120 mL 0  . lidocaine (XYLOCAINE) 2 % solution Patient: Mix 1part 2% viscous lidocaine, 1part H20. Swallow 67m of diluted mixture, 240m before meals and at bedtime, up to QID 100 mL 5  . lidocaine-prilocaine (EMLA) cream Apply 1 application topically as needed. 30 g 1  . LORazepam  (ATIVAN) 1 MG tablet Take 1 tablet (1 mg total) by mouth 3 (three) times daily as needed for anxiety. 15 tablet 0  . metoCLOPramide (REGLAN) 10 MG tablet Take 1 tablet (10 mg total) by mouth every 6 (six) hours as needed for nausea. 60 tablet 2  . Morphine Sulfate (MORPHINE CONCENTRATE) 10 mg / 0.5 ml concentrated solution Take 0.5 mLs (10 mg total) by mouth every 6 (six) hours as needed for severe pain. (Patient not taking: Reported on 04/19/2018) 30 mL 0  . omeprazole (PRILOSEC) 20 MG capsule Take 40 mg by mouth daily.     . ondansetron (ZOFRAN) 8 MG tablet Take 1 tablet (8 mg total) by mouth every 8 (eight) hours as needed for nausea or vomiting. 20 tablet 0  . potassium chloride 20 MEQ/15ML (10%) SOLN Take 15 mLs by mouth daily.    . sucralfate (CARAFATE) 1 g tablet Dissolve 1 tablet in 10 mL H20 and swallow up to QID to soothe throat. 40 tablet 5  . warfarin (COUMADIN)  5 MG tablet Take 1 tablet (5 mg total) by mouth daily. 30 tablet 3   No current facility-administered medications for this visit.    Facility-Administered Medications Ordered in Other Visits  Medication Dose Route Frequency Provider Last Rate Last Dose  . sodium chloride flush (NS) 0.9 % injection 3 mL  3 mL Intracatheter Once PRN [name], MD        PHYSICAL EXAMINATION: ECOG PERFORMANCE STATUS: 2 - Symptomatic, <50% confined to bed  Vitals:   05/09/18 1324  BP: (!) 143/78  Pulse: 70  Resp: 17  Temp: 98.1 F (36.7 C)  SpO2: 100%   Filed Weights   05/09/18 1324  Weight: 192 lb 4.8 oz (87.2 kg)    GENERAL:alert, no distress and comfortable SKIN: skin color, texture, turgor are normal, no rashes or significant lesions EYES: normal, Conjunctiva are pink and non-injected, sclera clear OROPHARYNX:no exudate, no erythema and lips, buccal mucosa, and tongue normal  NECK: supple, thyroid normal size, non-tender, without nodularity LYMPH:  no palpable lymphadenopathy in the cervical, axillary or inguinal LUNGS: clear  to auscultation and percussion with normal breathing effort HEART: regular rate & rhythm and no murmurs and no lower extremity edema ABDOMEN:abdomen soft, non-tender and normal bowel sounds Musculoskeletal:no cyanosis of digits and no clubbing  NEURO: alert & oriented x 3 with fluent speech, no focal motor/sensory deficits  LABORATORY DATA:  I have reviewed the data as listed CBC Latest Ref Rng & Units 05/09/2018 04/19/2018 04/12/2018  WBC 4.0 - 10.5 K/uL 8.2 7.9 2.4(L)  Hemoglobin 12.0 - 15.0 g/dL 12.1 10.8(L) 9.4(L)  Hematocrit 36.0 - 46.0 % 36.7 33.1(L) 29.9(L)  Platelets 150 - 400 K/uL 179 175 198     CMP Latest Ref Rng & Units 04/19/2018 04/12/2018 04/10/2018  Glucose 70 - 99 mg/dL 106(H) 101(H) 119(H)  BUN 8 - 23 mg/dL _0 Creatinine 0.44 - 1.00 mg/dL 0.74 0.66 0.79  Sodium 135 - 145 mmol/L 140 136 135  Potassium 3.5 - 5.1 mmol/L 3.2(L) 3.7 4.0  Chloride 98 - 111 mmol/L 104 104 102  CO2 22 - 32 mmol/L _1 Calcium 8.9 - 10.3 mg/dL 9.2 8.7(L) 9.6  Total Protein 6.5 - 8.1 g/dL 6.5 - 6.4(L)  Total Bilirubin 0.3 - 1.2 mg/dL 0.6 - 0.8  Alkaline Phos 38 - 126 U/L 152(H) - 151(H)  AST 15 - 41 U/L 17 - 13(L)  ALT 0 - 44 U/L 17 - 17      RADIOGRAPHIC STUDIES: I have personally reviewed the radiological images as listed and agreed with the findings in the report.  04/28/2018 MR Brain W Wo Contrast  IMPRESSION: Right superior cerebellar metastasis is smaller, 2.7 x 2.5 x 2.6 cm today compared with 3.0 x 2.6 x 2.9 cm in November. Considerable increase in edema of the cerebellum, presumably secondary to treatment. Mass-effect upon the fourth ventricle. Slight increase in prominence of the lateral ventricles, suggesting a low level of hydrocephalus.  ASSESSMENT & PLAN:  Kristin Williams is a 73 y.o. female with history of   1. EsophagealAdenocarcinoma,with oligo met incerebellum, stage IV  -She was diagnosed with esophogeal cancerin 01/2018,with oligometastasisto  the brain. Given her stage IV disease her cancer is not curable but still treatable. Goal of care it to control her disease.  -She completedSBRTto her brain meton11/27/2019and completed chemoRT to primary tumor on 04/07/18. -She is recovering from treatment well. Dysphagia,odynophagia,nausea mostly resolved and she is able to eat better.  -Labs reviewed,  her CBC has been back to normal, CMP unremarkable except a mild hypokalemia, she will continue potassium supplement -We will see her back in 4 weeks, to see if she is ready to start consolidation chemotherapy  2. Dysphagia,odynophagia,nausea, low appetite  -secondary toher esophageal cancer and chemoradiation. -she is recovering well overall. Her Odynophagiais overall resolved now and has not needed pain medication lately -also eating and swallowing better   3. RightBreastInvasive Ductal Carcinoma,cT1bN0M0 stage IA,Grade II, ER/PR Positive, HER 2 negative -Diagnosed through screening mammogram, same time as her esophageal cancer in October 2019.  -Given her early stage breast cancer, I previously discussedher breast cancer treatment is less urgent than esophogeal cancertreatment. -Given her EP/PR breast cancer and post-menopausal status, I recommend antiestrogen therapy with anastrozole, she has started, and tolerating well  -may consider breast surgery if she has no further cancer progression from esophageal cancer   4.Oligo brain mets -This is likely from her primaryesophogealcancer.  -She does have balance issues and ambulates with assistance.  -She is s/p SBRT. Hermild slurred speech, right sided weaknessand balance stabilityhas muchimproved -Will monitor with brain scan every 3 months. -Over the last 2 weeks she has developed less control of right upper extremity and persistent slurred speech.  -She will continue low dose Dexa 29m daily until scan.  - She had a brain MRI on 04/28/2018  5. H/o Left  breast cancer, ER/PR negative, treated by Dr. MJana Hakimwith lumpectomy, adj, chemo and radiation. -She was previously referred to genetics  -no evidence of recurrence    6.Acute deep vein thrombosis (DVT) of popliteal vein of left lower extremity, diagnosed on 02/28/2018  -She was found to have left lower extremity edema on his first day of chemoradiation, Doppler showed DVT -she is on coumadin 522mdaily, INR 2.0 today, therapeutic, will continue Coumadin at the same dose.   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  -Stable  8.Goal of care discussion  -she is full code now -Patient understand her treatment is palliative to prolong her life, her cancer is not curable unfortunately.  9.COPD -Shehas chronic cough -She presented to ED for COPD excerebration on 04/12/18. She has recovered and her breathing is improved.   10. Peripheral swelling -She has developed bilateral edema on her legs and hands -I previously strongly encouragedher to elevate her legs at home  -Her swelling continues to improve. She is no longer on lasix.   11. Hypokalemia -Onoral potassium 20 mEq daily.I told her to increase to bid for next 3 days due to mild hypokalemia today   PLAN: -IV Fluids today for 1hr   -Lab, flush and f/u in 4 weeks  - I refilled warfarin    No problem-specific Assessment & Plan notes found for this encounter.   No orders of the defined types were placed in this encounter.  All questions were answered. The patient knows to call the clinic with any problems, questions or concerns. No barriers to learning was detected. I spent 15 minutes counseling the patient face to face. The total time spent in the appointment was 20 minutes and more than 50% was on counseling and review of test results  I, DiManson Allanm acting as scribe for Dr. YaTruitt Merle I have reviewed the above documentation for accuracy and completeness, and I agree with the above.     YaTruitt MerleMD 05/09/2018

## 2018-05-09 ENCOUNTER — Encounter: Payer: Self-pay | Admitting: Internal Medicine

## 2018-05-09 ENCOUNTER — Ambulatory Visit: Payer: Medicare Other | Admitting: Medical

## 2018-05-09 ENCOUNTER — Inpatient Hospital Stay: Payer: Medicare Other

## 2018-05-09 ENCOUNTER — Telehealth: Payer: Self-pay | Admitting: Hematology

## 2018-05-09 ENCOUNTER — Telehealth: Payer: Self-pay | Admitting: Internal Medicine

## 2018-05-09 ENCOUNTER — Inpatient Hospital Stay (HOSPITAL_BASED_OUTPATIENT_CLINIC_OR_DEPARTMENT_OTHER): Payer: Medicare Other | Admitting: Internal Medicine

## 2018-05-09 ENCOUNTER — Other Ambulatory Visit: Payer: Self-pay

## 2018-05-09 ENCOUNTER — Other Ambulatory Visit: Payer: Medicare Other

## 2018-05-09 ENCOUNTER — Inpatient Hospital Stay (HOSPITAL_BASED_OUTPATIENT_CLINIC_OR_DEPARTMENT_OTHER): Payer: Medicare Other | Admitting: Hematology

## 2018-05-09 VITALS — BP 143/78 | HR 70 | Temp 98.1°F | Resp 17 | Ht 60.0 in | Wt 192.3 lb

## 2018-05-09 DIAGNOSIS — I82432 Acute embolism and thrombosis of left popliteal vein: Secondary | ICD-10-CM

## 2018-05-09 DIAGNOSIS — J449 Chronic obstructive pulmonary disease, unspecified: Secondary | ICD-10-CM

## 2018-05-09 DIAGNOSIS — Z95828 Presence of other vascular implants and grafts: Secondary | ICD-10-CM

## 2018-05-09 DIAGNOSIS — G620 Drug-induced polyneuropathy: Secondary | ICD-10-CM

## 2018-05-09 DIAGNOSIS — C7931 Secondary malignant neoplasm of brain: Secondary | ICD-10-CM | POA: Diagnosis not present

## 2018-05-09 DIAGNOSIS — R634 Abnormal weight loss: Secondary | ICD-10-CM | POA: Diagnosis not present

## 2018-05-09 DIAGNOSIS — C159 Malignant neoplasm of esophagus, unspecified: Secondary | ICD-10-CM

## 2018-05-09 DIAGNOSIS — E876 Hypokalemia: Secondary | ICD-10-CM

## 2018-05-09 DIAGNOSIS — Z853 Personal history of malignant neoplasm of breast: Secondary | ICD-10-CM | POA: Diagnosis not present

## 2018-05-09 DIAGNOSIS — I824Z2 Acute embolism and thrombosis of unspecified deep veins of left distal lower extremity: Secondary | ICD-10-CM

## 2018-05-09 DIAGNOSIS — C50911 Malignant neoplasm of unspecified site of right female breast: Secondary | ICD-10-CM

## 2018-05-09 DIAGNOSIS — Z7189 Other specified counseling: Secondary | ICD-10-CM

## 2018-05-09 LAB — CMP (CANCER CENTER ONLY)
ALT: 19 U/L (ref 0–44)
AST: 14 U/L — ABNORMAL LOW (ref 15–41)
Albumin: 3.5 g/dL (ref 3.5–5.0)
Alkaline Phosphatase: 100 U/L (ref 38–126)
Anion gap: 11 (ref 5–15)
BUN: 24 mg/dL — ABNORMAL HIGH (ref 8–23)
CO2: 22 mmol/L (ref 22–32)
CREATININE: 0.82 mg/dL (ref 0.44–1.00)
Calcium: 9.5 mg/dL (ref 8.9–10.3)
Chloride: 101 mmol/L (ref 98–111)
GFR, Est AFR Am: >60 mL/min (ref >60)
GFR, Estimated: >60 mL/min (ref >60)
Glucose, Bld: 101 mg/dL — ABNORMAL HIGH (ref 70–99)
Potassium: 4.8 mmol/L (ref 3.5–5.1)
Sodium: 134 mmol/L — ABNORMAL LOW (ref 135–145)
Total Bilirubin: 0.4 mg/dL (ref 0.3–1.2)
Total Protein: 6.8 g/dL (ref 6.5–8.1)

## 2018-05-09 LAB — CBC WITH DIFFERENTIAL (CANCER CENTER ONLY)
Abs Immature Granulocytes: 0.12 10*3/uL — ABNORMAL HIGH (ref 0.00–0.07)
Basophils Absolute: 0 10*3/uL (ref 0.0–0.1)
Basophils Relative: 0 %
EOS PCT: 0 %
Eosinophils Absolute: 0 10*3/uL (ref 0.0–0.5)
HCT: 36.7 % (ref 36.0–46.0)
Hemoglobin: 12.1 g/dL (ref 12.0–15.0)
Immature Granulocytes: 2 %
Lymphocytes Relative: 3 %
Lymphs Abs: 0.3 10*3/uL — ABNORMAL LOW (ref 0.7–4.0)
MCH: 32.7 pg (ref 26.0–34.0)
MCHC: 33 g/dL (ref 30.0–36.0)
MCV: 99.2 fL (ref 80.0–100.0)
MONO ABS: 0.5 10*3/uL (ref 0.1–1.0)
Monocytes Relative: 6 %
Neutro Abs: 7.3 10*3/uL (ref 1.7–7.7)
Neutrophils Relative %: 89 %
Platelet Count: 179 10*3/uL (ref 150–400)
RBC: 3.7 MIL/uL — ABNORMAL LOW (ref 3.87–5.11)
RDW: 17.9 % — ABNORMAL HIGH (ref 11.5–15.5)
WBC Count: 8.2 10*3/uL (ref 4.0–10.5)
nRBC: 0 % (ref 0.0–0.2)

## 2018-05-09 LAB — PROTIME-INR
INR: 2 — ABNORMAL HIGH (ref 0.8–1.2)
Prothrombin Time: 22.6 seconds — ABNORMAL HIGH (ref 11.4–15.2)

## 2018-05-09 MED ORDER — ENOXAPARIN SODIUM 150 MG/ML ~~LOC~~ SOLN: 140.0000 mg | Freq: Once | SUBCUTANEOUS | Status: DC

## 2018-05-09 MED ORDER — ALTEPLASE 2 MG IJ SOLR
2.0000 mg | Freq: Once | INTRAMUSCULAR | Status: DC | PRN
  Filled 2018-05-09: qty 2

## 2018-05-09 MED ORDER — SODIUM CHLORIDE 0.9% FLUSH
3.0000 mL | Freq: Once | INTRAVENOUS | Status: DC | PRN
  Filled 2018-05-09: qty 10

## 2018-05-09 MED ORDER — SODIUM CHLORIDE 0.9 % IV SOLN: Freq: Once | INTRAVENOUS | Status: DC

## 2018-05-09 MED ORDER — HEPARIN SOD (PORK) LOCK FLUSH 100 UNIT/ML IV SOLN
500.0000 [IU] | Freq: Once | INTRAVENOUS | Status: AC | PRN
  Administered 2018-05-09: 500 [IU]
  Filled 2018-05-09: qty 5

## 2018-05-09 MED ORDER — WARFARIN SODIUM 5 MG PO TABS: 5.0000 mg | ORAL_TABLET | Freq: Every day | ORAL | 3 refills | Status: DC

## 2018-05-09 MED ORDER — SODIUM CHLORIDE 0.9% FLUSH
10.0000 mL | Freq: Once | INTRAVENOUS | Status: AC | PRN
  Administered 2018-05-09: 10 mL
  Filled 2018-05-09: qty 10

## 2018-05-09 MED ORDER — SODIUM CHLORIDE 0.9% FLUSH
10.0000 mL | Freq: Once | INTRAVENOUS | Status: AC
  Administered 2018-05-09: 10 mL
  Filled 2018-05-09: qty 10

## 2018-05-09 MED ORDER — SODIUM CHLORIDE 0.9 % IV SOLN
INTRAVENOUS | Status: AC
  Filled 2018-05-09: qty 250

## 2018-05-09 MED ORDER — SODIUM CHLORIDE 0.9 % IV SOLN
Freq: Once | INTRAVENOUS | Status: AC
  Administered 2018-05-09: 14:00:00 via INTRAVENOUS
  Filled 2018-05-09: qty 250

## 2018-05-09 NOTE — Progress Notes (Signed)
[facility] [place] [place], [place] [address] [phone]   New Patient Evaluation  Date of Service: 05/09/18 Patient Name: Kristin Williams Patient MRN: [id number] Patient DOB: 03-14-1946 Provider: Ventura Sellers, MD  Identifying Statement:  Kristin Williams is a 73 y.o. 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    01/30/2018 Initial Diagnosis    Metastasis to brain [facility])    01/31/2018 Imaging    MRI Brain W WO Contrast 01/31/18  IMPRESSION: 3 Tesla study does not disclose any additional lesions. Solitary metastasis in the right superior cerebellum measuring 2.9 x 2.6 x 3.0 cm with mild surrounding edema. 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    02/03/2018 - 02/08/2018 Radiation Therapy    SBRT with Dr. Isidore Moos on 02/03/18, 02/06/18, 02/08/18     Esophageal cancer, stage IV ([place])   01/08/2018 Imaging    CT AP W Constrast 01/08/18  IMPRESSION: 1. Nonobstructed, nondistended bowel. No acute inflammatory process. 2. Moderate-sized hiatal hernia noted. 3. Low-density 1.7 cm left adrenal nodule likely to represent a small adenoma. Lower pole left renal cyst measuring  1.8 cm. 4. Moderate aortic atherosclerosis. 5. Lumbar spondylosis and facet arthropathy.    01/27/2018 Imaging    CT Chest W Contrast 01/27/18  IMPRESSION: 1. Large mass involving the distal half of the esophagus spanning approximately 11.7 cm is identified compatible with primary esophageal neoplasm. 2. Small posterior mediastinal and high right paratracheal lymph nodes are noted which may represent foci of metastatic adenopathy. 3. The small pulmonary nodules in the right lung are unchanged from 01/15/2016 and are favored to represent a benign process. 4. Aortic Atherosclerosis (ICD10-I70.0) and Emphysema (ICD10-J43.9). 5. Coronary artery atherosclerotic calcifications.    01/30/2018 Initial Diagnosis    Esophageal cancer, stage IV ([place])    02/03/2018 - 02/08/2018 Radiation Therapy    SBRT to oligo brain metastasis, in 3 fractions, under Dr. Isidore Moos     02/16/2018 PET scan    PET 02/16/18 IMPRESSION: 1. A 15.7 cm in length esophageal mass extends from the aortic arch level down to the distal esophagus, maximum SUV 22.4, compatible with malignancy. There is a small right lower neck level IV lymph node with lower than mediastinal blood pool activity, and a right upper paratracheal lymph node with just above mediastinal blood pool activity. 2. Faint speckled heterogeneity in the liver, for example in the lateral segment left hepatic lobe, is likely incidental/benign given the lack of correlate of CT finding. It might be prudent to obtain an MRI just to make sure that there is not a small early metastatic lesion in this vicinity. 3. Known right cerebellar vermian metastatic lesion, with expected hypo activity compared to the surrounding brain activity. 4. The right middle lobe pulmonary  nodule measures 1.1 by 1.0 cm and is mildly hypermetabolic with maximum SUV 2.6. This lesion measured 6 by 5 mm back on 12/06/2003 and 1.2 by 1.1 cm on 10/15/2015. I am uncertain what to make of  the very slow growth and mildly accentuated metabolic activity. This could be a chronic granulomatous process. Low-grade adenocarcinoma of the lung seems unlikely to be so indolent as to only mildly increase over a 14 year period, but is presumably a differential diagnostic consideration. Surveillance is suggested. 5. Other imaging findings of potential clinical significance: Aortic Atherosclerosis (ICD10-I70.0). Descending and sigmoid colon diverticulosis. Gas in the urinary bladder, most common cause would be recent catheterization. Minimal chronic left ethmoid sinusitis.    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 Ultrasound-guided biopsy is recommended.    01/10/2018 Initial Biopsy    Diagnsis from Breast Biopsy 01/10/18  Breast, right, needle core biopsy, 6 o'clock - INVASIVE DUCTAL CARCINOMA WITH CALCIFICATIONS, SEE COMMENT. - DUCTAL CARCINOMA IN SITU. 1 of 2 FINAL for Kristin Williams, Kristin Williams [phone]) Microscopic Comment The carcinoma appears grade 2. Prognostic markers will be ordered. Dr. [name] has reviewed the case. The case was called to [place] on 01/11/2018.    01/10/2018 Receptors her2    The tumor cells are NEGATIVE for Her2 (1+). Estrogen Receptor: 100%, POSITIVE, STRONG STAINING INTENSITY Progesterone Receptor: 100%, POSITIVE, STRONG STAINING INTENSITY Proliferation Marker Ki67: 15%    01/10/2018 Cancer Staging    Staging form: Breast, AJCC 8th Edition - Clinical stage from  01/10/2018: Stage IA (cT1b, cN0, cM0, G2, ER+, PR+, HER2-) - Signed by [name], MD on 02/03/2018    01/30/2018 Initial Diagnosis    Breast cancer ([place])    05/07/2018 -  Anti-estrogen oral therapy    Anastrozole '1mg'$  daily starting 05/07/2018     History of Present Illness: The patient's records from the referring physician were obtained and reviewed and the patient interviewed to confirm this HPI.  Kristin Williams presents today to discuss neurologic symptoms from brain metastasis.  She describes onset of gait instability, imbalance, and slurred speech in November 2019, which led to uncovering of large metastatic deposit in the cerebellum.  This was treated with fractionated radiosurgery, which did cause temporary improvement in symptomatology and overall functional status.  Steroids were successfully weaned following radiation, but later restarted by Dr. Burr Medico. Starting ~2 months later, she began to experience recurrent decline in gait and speech.  MRI demonstrated inflammatory process around radiosurgery site, and decadron was increased to '4mg'$  TID with plan for gradual taper by Dr. Isidore Moos.  Now one week into therapy, she has not noticed any improvement.  She continues to ambulate with a walker, but denies any issues with memory, cognition, language, or coordination.  She does describe increased difficulty getting up stairs and up from the commode.  Otherwise able to write, use utensils, and button her shirt.  Currently on anastrozole for breast ca and will discuss today with Dr. Burr Medico plans for systemic therapy for esophageal cancer, now having completed local radiation.  Medications: Current Outpatient Medications on File Prior to Visit  Medication Sig Dispense Refill  . acetaminophen (TYLENOL) 160 MG/5ML liquid Take 325 mg by mouth every 4 (four) hours as needed for fever.    Marland Kitchen anastrozole (ARIMIDEX) 1 MG tablet Take 1 tablet (1 mg total) by mouth daily. 30 tablet 2  . dexamethasone (DECADRON) 4  MG tablet Taper as instructed 45 tablet 0  . furosemide (LASIX) 20 MG tablet Take 1 tablet (20 mg total) by mouth every other day. 15 tablet 0  . lidocaine (XYLOCAINE) 2 % solution Patient: Mix 1part 2% viscous lidocaine, 1part H20. Swallow 73m of diluted mixture, 271m before meals and at bedtime, up to QID 100 mL 5  . lidocaine-prilocaine (EMLA) cream Apply 1 application topically as needed. 30 g 1  . LORazepam (ATIVAN) 1 MG tablet Take 1 tablet (1 mg total) by mouth 3 (three) times daily as needed for anxiety. 15 tablet 0  . metoCLOPramide (REGLAN) 10 MG tablet Take 1 tablet (10 mg total) by mouth every 6 (six) hours as needed for nausea. 60 tablet 2  . omeprazole (PRILOSEC) 20 MG capsule Take 40 mg by mouth daily.     . ondansetron (ZOFRAN) 8 MG tablet Take 1 tablet (8 mg total) by mouth every 8 (eight) hours as needed for nausea or vomiting. 20 tablet 0  . potassium chloride 20 MEQ/15ML (10%) SOLN Take 15 mLs by mouth daily.    . sucralfate (CARAFATE) 1 g tablet Dissolve 1 tablet in 10 mL H20 and swallow up to QID to soothe throat. 40 tablet 5  . dextromethorphan-guaiFENesin (MUCINEX DM) 30-600 MG 12hr tablet Take 1 tablet by mouth as needed for cough.    . Marland KitchenYDROcodone-acetaminophen (HYCET) 7.5-325 mg/15 ml solution Take 7.5 mLs by mouth every 8 (eight) hours as needed for moderate pain. (Patient not taking: Reported on 05/02/2018) 250 mL 0  . HYDROcodone-homatropine (HYCODAN) 5-1.5 MG/5ML syrup Take 5 mLs by mouth every 6 (six) hours as needed for cough. (Patient not taking: Reported on 05/02/2018) 120 mL 0  . Morphine Sulfate (MORPHINE CONCENTRATE) 10 mg / 0.5 ml concentrated solution Take 0.5 mLs (10 mg total) by mouth every 6 (six) hours as needed for severe pain. (Patient not taking: Reported on 04/19/2018) 30 mL 0  . warfarin (COUMADIN) 5 MG tablet Take 1 tablet (5 mg total) by mouth daily. 30 tablet 3   Current Facility-Administered Medications on File Prior to Visit  Medication Dose Route  Frequency Provider Last Rate Last Dose  . alteplase (CATHFLO ACTIVASE) injection 2 mg  2 mg Intracatheter Once PRN FeTruitt MerleMD        Allergies:  Allergies  Allergen Reactions  . Flonase [Fluticasone Propionate] Other (See Comments)    Nose bleeds   Past Medical History:  Past Medical History:  Diagnosis Date  . Arthritis   . Cancer (H[facility])    Cervical  . Cancer ([facility] [place]   Breast  . Cancer ([facility]   Vaginal  . Complication of anesthesia   . COPD (chronic obstructive pulmonary disease) ([facility]  . Headache(784.0)   . History of radiation therapy 02/28/19- 04/07/18   Esophagus, 1.8 Gy in 28 fractions for a total dose of 50.4 Gy.   . Marland Kitchenistory of radiation therapy 02/03/18- 02/08/18   Brain Rt Sup Cerebellum// 6 FFF photons.   . Hyperlipidemia   . Leg pain   . Peripheral vascular disease ([facility]  . Personal history of chemotherapy   .  Personal history of radiation therapy   . PONV (postoperative nausea and vomiting)    Past Surgical History:  Past Surgical History:  Procedure Laterality Date  . ABDOMINAL HYSTERECTOMY  1975  . BREAST LUMPECTOMY  2005  . GANGLION CYST EXCISION  1977  . ILIAC ARTERY STENT  04/20/2004   CSD right iliac occlusive disease  . IR IMAGING GUIDED PORT INSERTION  02/22/2018   Social History:  Social History   Socioeconomic History  . Marital status: Married    Spouse name: Not on file  . Number of children: 2  . Years of education: Not on file  . Highest education level: Not on file  Occupational History  . Not on file  Social Needs  . Financial resource strain: Not on file  . Food insecurity:    Worry: Not on file    Inability: Not on file  . Transportation needs:    Medical: No    Non-medical: No  Tobacco Use  . Smoking status: Former Smoker    Packs/day: 0.50    Years: 40.00    Pack years: 20.00    Types: Cigarettes    Last attempt to quit: 01/27/2018    Years since quitting: 0.2  . Smokeless tobacco: Never Used  . Tobacco comment:  She admits to smoking a few puffs daily- 05/02/18  Substance and Sexual Activity  . Alcohol use: No  . Drug use: No  . Sexual activity: Not Currently  Lifestyle  . Physical activity:    Days per week: Not on file    Minutes per session: Not on file  . Stress: Not on file  Relationships  . Social connections:    Talks on phone: Not on file    Gets together: Not on file    Attends religious service: Not on file    Active member of club or organization: Not on file    Attends meetings of clubs or organizations: Not on file    Relationship status: Not on file  . Intimate partner violence:    Fear of current or ex partner: No    Emotionally abused: No    Physically abused: No    Forced sexual activity: No  Other Topics Concern  . Not on file  Social History Narrative   Resides in [place]. Resides with her husband.    Family History:  Family History  Problem Relation Age of Onset  . Cancer Mother 79       Non-Hodgkins Disease T-Cell  . Heart failure Father   . COPD Father   . Heart disease Brother 50       MI and Heart Disease before age 50  . Heart attack Brother   . Lung cancer Maternal Uncle   . Cancer Cousin   . Prostate cancer Maternal Uncle   . Prostate cancer Brother        half brother  . Breast cancer Neg Hx     Review of Systems: Constitutional: Denies fevers, chills or abnormal weight loss Eyes: Denies blurriness of vision Ears, nose, mouth, throat, and face: Denies mucositis or sore throat Respiratory: Denies cough, dyspnea or wheezes Cardiovascular: Denies palpitation, chest discomfort or lower extremity swelling Gastrointestinal:  +dyshpagia, odynophagia GU: Denies dysuria or incontinence Skin: Denies abnormal skin rashes Neurological: Per HPI Musculoskeletal: Denies joint pain, back or neck discomfort. No decrease in ROM Behavioral/Psych: Denies anxiety, disturbance in thought content, and mood instability   Physical Exam: Vitals:   05/09/18  1103  BP: (!) 143/78  Pulse: 70  Resp: 17  Temp: 98.1 F (36.7 C)  SpO2: 100%   KPS: 70. General: Alert, cooperative, pleasant, in no acute distress Head: Craniotomy scar noted, dry and intact. EENT: No conjunctival injection or scleral icterus. Oral mucosa moist Lungs: Resp effort normal Cardiac: Regular rate and rhythm Abdomen: Soft, non-distended abdomen Skin: No rashes cyanosis or petechiae. Extremities: No clubbing or edema  Neurologic Exam: Mental Status: Awake, alert, attentive to examiner. Oriented to self and environment. Language is fluent with intact comprehension.  Cranial Nerves: Visual acuity is grossly normal. Visual fields are full. Extra-ocular movements intact. No ptosis. Face is symmetric, tongue midline. Motor: Tone and bulk are normal. Power is full in both arms and legs except for mild symmetric weakness in hip girdle. Reflexes are symmetric, no pathologic reflexes present. Subtle impairment in fine coordination only. Sensory: Intact to light touch and temperature Gait: Wide base gait   Labs: I have reviewed the data as listed    Component Value Date/Time   NA 134 (L) 05/09/2018 1235   K 4.8 05/09/2018 1235   CL 101 05/09/2018 1235   CO2 22 05/09/2018 1235   GLUCOSE 101 (H) 05/09/2018 1235   BUN 24 (H) 05/09/2018 1235   CREATININE 0.82 05/09/2018 1235   CALCIUM 9.5 05/09/2018 1235   PROT 6.8 05/09/2018 1235   ALBUMIN 3.5 05/09/2018 1235   AST 14 (L) 05/09/2018 1235   ALT 19 05/09/2018 1235   ALKPHOS 100 05/09/2018 1235   BILITOT 0.4 05/09/2018 1235   GFRNONAA >60 05/09/2018 1235   GFRAA >60 05/09/2018 1235   Lab Results  Component Value Date   WBC 8.2 05/09/2018   NEUTROABS 7.3 05/09/2018   HGB 12.1 05/09/2018   HCT 36.7 05/09/2018   MCV 99.2 05/09/2018   PLT 179 05/09/2018    Imaging:  Dg Chest 2 View  Result Date: 04/12/2018 CLINICAL DATA:  Shortness of breath. EXAM: CHEST - 2 VIEW COMPARISON:  Radiographs of August 19, 2018. FINDINGS:  The heart size and mediastinal contours are within normal limits. Right internal jugular Port-A-Cath is unchanged in position. No pneumothorax is noted. Right lung is clear. Minimal left basilar subsegmental atelectasis is noted. No significant pleural effusion is noted. The visualized skeletal structures are unremarkable. IMPRESSION: Minimal left basilar subsegmental atelectasis. Electronically Signed   By: Marijo Conception, M.D.   On: 04/12/2018 13:43   Mr Jeri Cos YP Contrast  Result Date: 04/30/2018 CLINICAL DATA:  S RS restaging. Cerebellar metastasis treated with radiation and chemotherapy. Some blurred vision, slurred speech and right-sided weakness. EXAM: MRI HEAD WITHOUT AND WITH CONTRAST TECHNIQUE: Multiplanar, multiecho pulse sequences of the brain and surrounding structures were obtained without and with intravenous contrast. CONTRAST:  35m MULTIHANCE GADOBENATE DIMEGLUMINE 529 MG/ML IV SOLN COMPARISON:  CT 02/17/2018.  MRI 01/31/2018. FINDINGS: Brain: Solitary metastatic lesion in the superior cerebellum on the right has gotten smaller, 2.7 x 2.5 x 2.6 cm today compared with 3.0 x 2.6 x 2.9 cm in November. Again this shows a lobular contour with calcification. There is considerably more edema within the superior cerebellum, with more mass effect and flattening of the fourth ventricle, presumably related to treatment. There is minimal increase in size of the lateral ventricles suggesting that there could be a very minimal degree of hydrocephalus. Elsewhere, chronic small-vessel ischemic changes of the cerebral hemispheric white matter appears similar. No evidence recent infarction. No new metastasis is seen. No extra-axial collection. Vascular: Major vessels at  the base of the brain show flow. Skull and upper cervical spine: Negative. Benign appearing left posterior frontal vertex enhancement is unchanged. Sinuses/Orbits: Inflammatory change now visible in the right division of the sphenoid sinus.  Orbits negative. Other: None IMPRESSION: Right superior cerebellar metastasis is smaller, 2.7 x 2.5 x 2.6 cm today compared with 3.0 x 2.6 x 2.9 cm in November. Considerable increase in edema of the cerebellum, presumably secondary to treatment. Mass-effect upon the fourth ventricle. Slight increase in prominence of the lateral ventricles, suggesting a low level of hydrocephalus. No new metastasis. Right sphenoid sinusitis. Chronic small-vessel changes of the cerebral hemispheric white matter are stable Electronically Signed   By: Nelson Chimes M.D.   On: 04/30/2018 07:11    [place] Clinician Interpretation: I have personally reviewed the radiological images as listed.  My interpretation, in the context of the patient's clinical presentation, is likely treatment effect   Assessment/Plan 1. Metastasis to brain [facility])  Ms. Zaragoza has a clinical syndromes localizing to the cerebellar vermis, and also the hip girdle.  Her treated mestastasis is radiographically stable, with accompanying edema which is not excessive.  Lack of response to steroids suggest tumor burden is largely responsible for gait impairment, which is not surprising given the size and location of the tumor.  In addition, she has early signs of steroid myopathy which are concerning and functionally limiting.  We recommended continuing dexamethasone taper as prescribed: 25m BIDx 5 days, then 443mdaily x5 days, then 47m80maily x5 days  If decline continues in absence of tumor progression, Avastin might be a second line consideration.  This would need to be discussed with Dr. FenBurr Medicod incorporated into treatment planning.  She should return to clinic in 1 month for re-evaluation, or sooner as needed.  Next MRI should be performed in 3 months.  We spent twenty additional minutes teaching regarding the natural history, biology, and historical experience in the treatment of neurologic complications of cancer. We also provided teaching sheets for the  patient to take home as an additional resource.  We appreciate the opportunity to participate in the care of BerSHAKINA CHOY All questions were answered. The patient knows to call the clinic with any problems, questions or concerns. No barriers to learning were detected.  The total time spent in the encounter was 40 minutes and more than 50% was on counseling and review of test results   ZacVentura SellersD Medical Director of Neuro-Oncology [facility] WesRico/25/20 4:45 PM

## 2018-05-09 NOTE — Patient Instructions (Signed)

## 2018-05-09 NOTE — Telephone Encounter (Signed)
Scheduled appt per 02/25 los.  Patient aware of appt date and time.

## 2018-05-09 NOTE — Telephone Encounter (Signed)
Gave avs and calendar ° °

## 2018-05-10 ENCOUNTER — Encounter: Payer: Self-pay | Admitting: Hematology

## 2018-05-10 ENCOUNTER — Ambulatory Visit: Payer: Self-pay | Admitting: Radiation Oncology

## 2018-05-10 DIAGNOSIS — G8191 Hemiplegia, unspecified affecting right dominant side: Secondary | ICD-10-CM | POA: Diagnosis not present

## 2018-05-10 DIAGNOSIS — Z7901 Long term (current) use of anticoagulants: Secondary | ICD-10-CM | POA: Diagnosis not present

## 2018-05-10 DIAGNOSIS — R4781 Slurred speech: Secondary | ICD-10-CM | POA: Diagnosis not present

## 2018-05-10 DIAGNOSIS — Z79811 Long term (current) use of aromatase inhibitors: Secondary | ICD-10-CM | POA: Diagnosis not present

## 2018-05-10 DIAGNOSIS — C7931 Secondary malignant neoplasm of brain: Secondary | ICD-10-CM | POA: Diagnosis not present

## 2018-05-10 DIAGNOSIS — C159 Malignant neoplasm of esophagus, unspecified: Secondary | ICD-10-CM | POA: Diagnosis not present

## 2018-05-10 DIAGNOSIS — Z9181 History of falling: Secondary | ICD-10-CM | POA: Diagnosis not present

## 2018-05-10 DIAGNOSIS — R531 Weakness: Secondary | ICD-10-CM | POA: Diagnosis not present

## 2018-05-10 DIAGNOSIS — C50919 Malignant neoplasm of unspecified site of unspecified female breast: Secondary | ICD-10-CM | POA: Diagnosis not present

## 2018-05-11 ENCOUNTER — Telehealth: Payer: Self-pay

## 2018-05-11 NOTE — Telephone Encounter (Signed)
Spoke with patient's daughter Joelene Millin regarding old appointments scheduled for 3/4.  They do not feel we need to leave the infusion appointment on there as she is getting in 60 to 64 ounces of water daily.  Explained will cancel all of the appointments on 3/4, but to call us sooner if they feel she needs to see Dr. Burr Medico before 3/23.  She verbalized an understanding.

## 2018-05-15 DIAGNOSIS — C159 Malignant neoplasm of esophagus, unspecified: Secondary | ICD-10-CM | POA: Diagnosis not present

## 2018-05-15 DIAGNOSIS — R531 Weakness: Secondary | ICD-10-CM | POA: Diagnosis not present

## 2018-05-15 DIAGNOSIS — C50919 Malignant neoplasm of unspecified site of unspecified female breast: Secondary | ICD-10-CM | POA: Diagnosis not present

## 2018-05-15 DIAGNOSIS — G8191 Hemiplegia, unspecified affecting right dominant side: Secondary | ICD-10-CM | POA: Diagnosis not present

## 2018-05-15 DIAGNOSIS — R4781 Slurred speech: Secondary | ICD-10-CM | POA: Diagnosis not present

## 2018-05-15 DIAGNOSIS — C7931 Secondary malignant neoplasm of brain: Secondary | ICD-10-CM | POA: Diagnosis not present

## 2018-05-16 DIAGNOSIS — R4781 Slurred speech: Secondary | ICD-10-CM | POA: Diagnosis not present

## 2018-05-16 DIAGNOSIS — C50919 Malignant neoplasm of unspecified site of unspecified female breast: Secondary | ICD-10-CM | POA: Diagnosis not present

## 2018-05-16 DIAGNOSIS — R531 Weakness: Secondary | ICD-10-CM | POA: Diagnosis not present

## 2018-05-16 DIAGNOSIS — G8191 Hemiplegia, unspecified affecting right dominant side: Secondary | ICD-10-CM | POA: Diagnosis not present

## 2018-05-16 DIAGNOSIS — C159 Malignant neoplasm of esophagus, unspecified: Secondary | ICD-10-CM | POA: Diagnosis not present

## 2018-05-16 DIAGNOSIS — C7931 Secondary malignant neoplasm of brain: Secondary | ICD-10-CM | POA: Diagnosis not present

## 2018-05-17 ENCOUNTER — Encounter: Payer: Medicare Other | Admitting: Nutrition

## 2018-05-17 ENCOUNTER — Other Ambulatory Visit: Payer: Medicare Other

## 2018-05-17 ENCOUNTER — Ambulatory Visit: Payer: Medicare Other

## 2018-05-17 ENCOUNTER — Ambulatory Visit: Payer: Medicare Other | Admitting: Hematology

## 2018-05-17 DIAGNOSIS — C7931 Secondary malignant neoplasm of brain: Secondary | ICD-10-CM | POA: Diagnosis not present

## 2018-05-17 DIAGNOSIS — R531 Weakness: Secondary | ICD-10-CM | POA: Diagnosis not present

## 2018-05-17 DIAGNOSIS — C159 Malignant neoplasm of esophagus, unspecified: Secondary | ICD-10-CM | POA: Diagnosis not present

## 2018-05-17 DIAGNOSIS — R4781 Slurred speech: Secondary | ICD-10-CM | POA: Diagnosis not present

## 2018-05-17 DIAGNOSIS — G8191 Hemiplegia, unspecified affecting right dominant side: Secondary | ICD-10-CM | POA: Diagnosis not present

## 2018-05-17 DIAGNOSIS — C50919 Malignant neoplasm of unspecified site of unspecified female breast: Secondary | ICD-10-CM | POA: Diagnosis not present

## 2018-05-22 DIAGNOSIS — R4781 Slurred speech: Secondary | ICD-10-CM | POA: Diagnosis not present

## 2018-05-22 DIAGNOSIS — C7931 Secondary malignant neoplasm of brain: Secondary | ICD-10-CM | POA: Diagnosis not present

## 2018-05-22 DIAGNOSIS — R531 Weakness: Secondary | ICD-10-CM | POA: Diagnosis not present

## 2018-05-22 DIAGNOSIS — C50919 Malignant neoplasm of unspecified site of unspecified female breast: Secondary | ICD-10-CM | POA: Diagnosis not present

## 2018-05-22 DIAGNOSIS — C159 Malignant neoplasm of esophagus, unspecified: Secondary | ICD-10-CM | POA: Diagnosis not present

## 2018-05-22 DIAGNOSIS — G8191 Hemiplegia, unspecified affecting right dominant side: Secondary | ICD-10-CM | POA: Diagnosis not present

## 2018-05-23 ENCOUNTER — Other Ambulatory Visit: Payer: Self-pay | Admitting: *Deleted

## 2018-05-23 ENCOUNTER — Telehealth: Payer: Self-pay | Admitting: *Deleted

## 2018-05-23 MED ORDER — ANASTROZOLE 1 MG PO TABS: 1.0000 mg | ORAL_TABLET | Freq: Every day | ORAL | 2 refills | Status: DC

## 2018-05-23 NOTE — Telephone Encounter (Signed)
Received call from pt's daughter, Joelene Millin asking if script for anastrozole could be sent to [facility].  Script will be sent.

## 2018-05-24 DIAGNOSIS — C159 Malignant neoplasm of esophagus, unspecified: Secondary | ICD-10-CM | POA: Diagnosis not present

## 2018-05-24 DIAGNOSIS — C7931 Secondary malignant neoplasm of brain: Secondary | ICD-10-CM | POA: Diagnosis not present

## 2018-05-24 DIAGNOSIS — C50919 Malignant neoplasm of unspecified site of unspecified female breast: Secondary | ICD-10-CM | POA: Diagnosis not present

## 2018-05-24 DIAGNOSIS — R531 Weakness: Secondary | ICD-10-CM | POA: Diagnosis not present

## 2018-05-24 DIAGNOSIS — G8191 Hemiplegia, unspecified affecting right dominant side: Secondary | ICD-10-CM | POA: Diagnosis not present

## 2018-05-24 DIAGNOSIS — R4781 Slurred speech: Secondary | ICD-10-CM | POA: Diagnosis not present

## 2018-05-27 ENCOUNTER — Other Ambulatory Visit: Payer: Self-pay | Admitting: Radiation Oncology

## 2018-05-27 DIAGNOSIS — C154 Malignant neoplasm of middle third of esophagus: Secondary | ICD-10-CM

## 2018-05-29 ENCOUNTER — Other Ambulatory Visit: Payer: Self-pay | Admitting: Radiation Oncology

## 2018-05-29 DIAGNOSIS — G8191 Hemiplegia, unspecified affecting right dominant side: Secondary | ICD-10-CM | POA: Diagnosis not present

## 2018-05-29 DIAGNOSIS — C50919 Malignant neoplasm of unspecified site of unspecified female breast: Secondary | ICD-10-CM | POA: Diagnosis not present

## 2018-05-29 DIAGNOSIS — C159 Malignant neoplasm of esophagus, unspecified: Secondary | ICD-10-CM | POA: Diagnosis not present

## 2018-05-29 DIAGNOSIS — R4781 Slurred speech: Secondary | ICD-10-CM | POA: Diagnosis not present

## 2018-05-29 DIAGNOSIS — R531 Weakness: Secondary | ICD-10-CM | POA: Diagnosis not present

## 2018-05-29 DIAGNOSIS — C154 Malignant neoplasm of middle third of esophagus: Secondary | ICD-10-CM

## 2018-05-29 DIAGNOSIS — C7931 Secondary malignant neoplasm of brain: Secondary | ICD-10-CM | POA: Diagnosis not present

## 2018-05-30 ENCOUNTER — Encounter (HOSPITAL_COMMUNITY): Payer: Self-pay | Admitting: Emergency Medicine

## 2018-05-30 ENCOUNTER — Emergency Department (HOSPITAL_COMMUNITY)
Admission: EM | Admit: 2018-05-30 | Discharge: 2018-05-30 | Disposition: A | Discharge status: Home / Self Care | Payer: Medicare Other | Attending: Emergency Medicine | Admitting: Emergency Medicine

## 2018-05-30 ENCOUNTER — Telehealth: Payer: Self-pay

## 2018-05-30 ENCOUNTER — Other Ambulatory Visit: Payer: Self-pay

## 2018-05-30 ENCOUNTER — Emergency Department (HOSPITAL_COMMUNITY): Payer: Medicare Other

## 2018-05-30 DIAGNOSIS — Z923 Personal history of irradiation: Secondary | ICD-10-CM | POA: Diagnosis not present

## 2018-05-30 DIAGNOSIS — Z79899 Other long term (current) drug therapy: Secondary | ICD-10-CM | POA: Insufficient documentation

## 2018-05-30 DIAGNOSIS — Z8541 Personal history of malignant neoplasm of cervix uteri: Secondary | ICD-10-CM | POA: Insufficient documentation

## 2018-05-30 DIAGNOSIS — Z8501 Personal history of malignant neoplasm of esophagus: Secondary | ICD-10-CM | POA: Insufficient documentation

## 2018-05-30 DIAGNOSIS — R2689 Other abnormalities of gait and mobility: Secondary | ICD-10-CM | POA: Diagnosis not present

## 2018-05-30 DIAGNOSIS — Z8544 Personal history of malignant neoplasm of other female genital organs: Secondary | ICD-10-CM | POA: Diagnosis not present

## 2018-05-30 DIAGNOSIS — R51 Headache: Secondary | ICD-10-CM | POA: Diagnosis not present

## 2018-05-30 DIAGNOSIS — C7931 Secondary malignant neoplasm of brain: Secondary | ICD-10-CM | POA: Diagnosis not present

## 2018-05-30 DIAGNOSIS — Z87891 Personal history of nicotine dependence: Secondary | ICD-10-CM | POA: Insufficient documentation

## 2018-05-30 DIAGNOSIS — Z853 Personal history of malignant neoplasm of breast: Secondary | ICD-10-CM | POA: Diagnosis not present

## 2018-05-30 DIAGNOSIS — R531 Weakness: Secondary | ICD-10-CM | POA: Diagnosis not present

## 2018-05-30 DIAGNOSIS — R519 Headache, unspecified: Secondary | ICD-10-CM

## 2018-05-30 DIAGNOSIS — R200 Anesthesia of skin: Secondary | ICD-10-CM | POA: Insufficient documentation

## 2018-05-30 DIAGNOSIS — R110 Nausea: Secondary | ICD-10-CM | POA: Diagnosis present

## 2018-05-30 LAB — CBC WITH DIFFERENTIAL/PLATELET
Abs Immature Granulocytes: 0.09 10*3/uL — ABNORMAL HIGH (ref 0.00–0.07)
BASOS PCT: 0 %
Basophils Absolute: 0 10*3/uL (ref 0.0–0.1)
Eosinophils Absolute: 0 10*3/uL (ref 0.0–0.5)
Eosinophils Relative: 0 %
HCT: 37.8 % (ref 36.0–46.0)
Hemoglobin: 11.6 g/dL — ABNORMAL LOW (ref 12.0–15.0)
Immature Granulocytes: 1 %
Lymphocytes Relative: 4 %
Lymphs Abs: 0.2 10*3/uL — ABNORMAL LOW (ref 0.7–4.0)
MCH: 32.8 pg (ref 26.0–34.0)
MCHC: 30.7 g/dL (ref 30.0–36.0)
MCV: 106.8 fL — ABNORMAL HIGH (ref 80.0–100.0)
Monocytes Absolute: 0.4 10*3/uL (ref 0.1–1.0)
Monocytes Relative: 6 %
NEUTROS ABS: 5.6 10*3/uL (ref 1.7–7.7)
Neutrophils Relative %: 89 %
Platelets: 151 10*3/uL (ref 150–400)
RBC: 3.54 MIL/uL — AB (ref 3.87–5.11)
RDW: 15.6 % — ABNORMAL HIGH (ref 11.5–15.5)
WBC: 6.3 10*3/uL (ref 4.0–10.5)
nRBC: 0 % (ref 0.0–0.2)

## 2018-05-30 LAB — URINALYSIS, ROUTINE W REFLEX MICROSCOPIC
Bilirubin Urine: NEGATIVE
Glucose, UA: NEGATIVE mg/dL
Ketones, ur: NEGATIVE mg/dL
Nitrite: NEGATIVE
Protein, ur: NEGATIVE mg/dL
Specific Gravity, Urine: 1.003 — ABNORMAL LOW (ref 1.005–1.030)
pH: 7 (ref 5.0–8.0)

## 2018-05-30 LAB — BASIC METABOLIC PANEL
Anion gap: 8 (ref 5–15)
BUN: 10 mg/dL (ref 8–23)
CO2: 27 mmol/L (ref 22–32)
Calcium: 9.2 mg/dL (ref 8.9–10.3)
Chloride: 103 mmol/L (ref 98–111)
Creatinine, Ser: 0.64 mg/dL (ref 0.44–1.00)
GFR calc non Af Amer: >60 mL/min (ref >60)
Glucose, Bld: 99 mg/dL (ref 70–99)
Potassium: 3.6 mmol/L (ref 3.5–5.1)
SODIUM: 138 mmol/L (ref 135–145)

## 2018-05-30 LAB — APTT: aPTT: 40 seconds — ABNORMAL HIGH (ref 24–36)

## 2018-05-30 LAB — PROTIME-INR
INR: 2.1 — ABNORMAL HIGH (ref 0.8–1.2)
Prothrombin Time: 23.2 seconds — ABNORMAL HIGH (ref 11.4–15.2)

## 2018-05-30 MED ORDER — ACETAMINOPHEN 160 MG/5ML PO SOLN
650.0000 mg | Freq: Once | ORAL | Status: AC
  Administered 2018-05-30: 650 mg via ORAL
  Filled 2018-05-30: qty 20.3

## 2018-05-30 MED ORDER — ACETAMINOPHEN 325 MG PO TABS
650.0000 mg | ORAL_TABLET | Freq: Once | ORAL | Status: DC
  Filled 2018-05-30: qty 2

## 2018-05-30 NOTE — ED Notes (Signed)
Pt in CT.

## 2018-05-30 NOTE — Discharge Instructions (Addendum)
CT scan does not reveal any evidence of brain bleed or progression of disease. Results of your CAT scan have been discussed with your neuro oncologist.  Please anticipate a close follow-up with them.

## 2018-05-30 NOTE — Telephone Encounter (Signed)
Patient's daughter calls patient had sudden onset of headache and vomiting, patient sees Dr. Mickeal Skinner as well and is on a steroid taper.  Spoke with Dr. Mickeal Skinner he advises that she go to [facility] ED for brain scan.  Spoke with Joelene Millin (daughter) instructed her to take her to [facility] ED for evaluation.   She verbalized an understanding.

## 2018-05-30 NOTE — ED Notes (Signed)
Pt given water and made aware of a need for a urine sample.

## 2018-05-30 NOTE — ED Triage Notes (Signed)
Having nausea, unsteady on feet called PCP who advised to come to Ed for brain scan to see if more fluid on brain or bleed. Pt has brain tumor, esophagus as well and being treated by [facility].

## 2018-05-31 ENCOUNTER — Other Ambulatory Visit: Payer: Self-pay | Admitting: Hematology

## 2018-05-31 ENCOUNTER — Other Ambulatory Visit: Payer: Self-pay | Admitting: Radiation Therapy

## 2018-05-31 DIAGNOSIS — C159 Malignant neoplasm of esophagus, unspecified: Secondary | ICD-10-CM

## 2018-05-31 NOTE — ED Provider Notes (Signed)
[facility] DEPT Provider Note   CSN: [id number] Arrival date & time: 05/30/18  1057    History   Chief Complaint Chief Complaint  Patient presents with  . sent for brain scan by PCP  . Nausea  . Headache    HPI Kristin Williams is a 73 y.o. female.     HPI 73 year old female comes with chief complaint of nausea, headache. 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  She reports that she is undergoing wean of steroids.  Over the last few days she has been having increased weakness on the right side and increased balance issues, with her favoring the right side when she walks.  She called her neuro oncologist who recommended the patient come to the ER for further evaluation.  Review of systems also positive for headaches that are not severe and not worse at nighttime.  Past Medical History:  Diagnosis Date  . Arthritis   . Cancer (HCC)    Cervical  . Cancer ([place])    Breast  . Cancer ([place])    Vaginal  . Complication of anesthesia   . COPD (chronic obstructive pulmonary disease) ([place])   . Headache(784.0)   . History of radiation therapy 02/28/19- 04/07/18   Esophagus, 1.8 Gy in 28 fractions for a total dose of 50.4 Gy.   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   . Hyperlipidemia   . Leg pain   . Peripheral vascular disease ([place])   . Personal history of chemotherapy   . Personal history of radiation therapy   . PONV (postoperative nausea and vomiting)     Patient Active Problem List   Diagnosis Date Noted  . Port-A-Cath in place 03/27/2018  . Cancer ([place])   . DVT, lower extremity, distal, acute, left ([place]) 03/03/2018  . Left leg swelling 02/27/2018  . Thrombocytopenia ([place]) 02/27/2018  . Protein calorie malnutrition ([place]) 02/27/2018  . 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 Goals of care, counseling/discussion 02/17/2018  .  Metastasis to brain ([place]) 01/30/2018  . Esophageal cancer, stage IV ([place]) 01/30/2018  . 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 Hypertension, essential 01/30/2018  . Hyperlipidemia 01/30/2018  . Tobacco use disorder 01/30/2018  .  Metastatic brain hemorrhage 01/27/2018  . Vasogenic cerebral edema ([place]) 01/27/2018  . Aftercare following surgery of the circulatory system, [place] 12/06/2012  . Iliac artery stenosis, right ([place]) 12/01/2011    Past Surgical History:  Procedure Laterality Date  . ABDOMINAL HYSTERECTOMY  1975  . BREAST LUMPECTOMY  2005  . GANGLION CYST EXCISION  1977  . ILIAC ARTERY STENT  04/20/2004   CSD right iliac occlusive disease  . IR IMAGING GUIDED PORT INSERTION  02/22/2018     OB History   No obstetric history on file.      Home Medications    Prior to Admission medications   Medication Sig Start Date End Date Taking? Authorizing Provider  acetaminophen (TYLENOL) 160 MG/5ML liquid Take 325 mg by mouth every 4 (four) hours as needed for fever.   Yes [provider]  albuterol (PROVENTIL HFA;VENTOLIN HFA) 108 (90 Base) MCG/ACT inhaler Inhale 2 puffs into the lungs every 6 (six) hours as needed for wheezing or shortness of breath.   Yes [provider]  anastrozole (ARIMIDEX) 1 MG tablet Take 1 tablet (1 mg total)  by mouth daily. 05/23/18  Yes [name], MD  dexamethasone (DECADRON) 4 MG tablet Taper as instructed Patient taking differently: Take 2 mg by mouth daily. Until 06/01/18 05/02/18  Yes Eppie Gibson, MD  furosemide (LASIX) 20 MG tablet Take 1 tablet (20 mg total) by mouth every other day. 03/27/18  Yes [name], MD  lidocaine (XYLOCAINE) 2 % solution MIX 1 PART 2% VISCOUS LIDOCAINE, 1 PART H20 SWALLOW 10ML OF DILUTED MIXTURE, 20 MIN BEFORE MEALS AND AT BEDTIME UP TO 4 TIMES DAILY 05/29/18  Yes Eppie Gibson, MD  lidocaine-prilocaine (EMLA) cream Apply 1 application topically as  needed. 02/27/18  Yes Curcio, Roselie Awkward, NP  LORazepam (ATIVAN) 1 MG tablet Take 1 tablet (1 mg total) by mouth 3 (three) times daily as needed for anxiety. 01/08/18  Yes Nat Christen, MD  omeprazole (PRILOSEC) 40 MG capsule Take 40 mg by mouth daily. 05/17/18  Yes [provider]  POTASSIUM CHLORIDE PO Take 20 mEq by mouth daily.  04/05/18  Yes [provider]  sucralfate (CARAFATE) 1 g tablet DISSOLVE ONE TABLET IN 10 MLS OF WATER AND SWALLOW UP TO FOUR TIMES DAILY TO SOOTHE THROAT 05/29/18  Yes Eppie Gibson, MD  warfarin (COUMADIN) 5 MG tablet Take 1 tablet (5 mg total) by mouth daily. 05/09/18  Yes [name], MD  HYDROcodone-acetaminophen (HYCET) 7.5-325 mg/15 ml solution Take 7.5 mLs by mouth every 8 (eight) hours as needed for moderate pain. Patient not taking: Reported on 05/02/2018 04/14/18 04/14/19  [name], MD  HYDROcodone-homatropine [facility]) 5-1.5 MG/5ML syrup Take 5 mLs by mouth every 6 (six) hours as needed for cough. Patient not taking: Reported on 05/02/2018 03/13/18   [name], MD  metoCLOPramide (REGLAN) 10 MG tablet TAKE 1 TABLET BY MOUTH EVERY 6 HOURS AS NEEDED FOR NAUSEA 05/31/18   [name], MD  Morphine Sulfate (MORPHINE CONCENTRATE) 10 mg / 0.5 ml concentrated solution Take 0.5 mLs (10 mg total) by mouth every 6 (six) hours as needed for severe pain. Patient not taking: Reported on 04/19/2018 04/10/18   [name], MD  ondansetron (ZOFRAN) 8 MG tablet Take 1 tablet (8 mg total) by mouth every 8 (eight) hours as needed for nausea or vomiting. Patient not taking: Reported on 05/30/2018 02/27/18   [name], MD    Family History Family History  Problem Relation Age of Onset  . Cancer Mother 45       Non-Hodgkins Disease T-Cell  . Heart failure Father   . COPD Father   . Heart disease Brother 49       MI and Heart Disease before age 32  . Heart attack Brother   . Lung cancer Maternal Uncle   . Cancer Cousin   . Prostate cancer Maternal Uncle   . Prostate  cancer Brother        half brother  . Breast cancer Neg Hx     Social History Social History   Tobacco Use  . Smoking status: Former Smoker    Packs/day: 0.50    Years: 40.00    Pack years: 20.00    Types: Cigarettes    Last attempt to quit: 01/27/2018    Years since quitting: 0.3  . Smokeless tobacco: Never Used  . Tobacco comment: She admits to smoking a few puffs daily- 05/02/18  Substance Use Topics  . Alcohol use: No  . Drug use: No     Allergies   Flonase [fluticasone propionate]   Review of Systems Review of  Systems  Constitutional: Positive for activity change.  Gastrointestinal: Positive for nausea and vomiting.  Neurological: Positive for weakness, numbness and headaches.  Hematological: Does not bruise/bleed easily.  All other systems reviewed and are negative.    Physical Exam Updated Vital Signs BP (!) 142/41   Pulse 64   Temp 98.2 F (36.8 C) (Oral)   Resp 18   Ht 5' (1.524 m)   Wt 86.2 kg   LMP 02/17/2018   SpO2 99%   BMI 37.11 kg/m   Physical Exam Vitals signs and nursing note reviewed.  Constitutional:      Appearance: She is well-developed.  HENT:     Head: Normocephalic and atraumatic.  Eyes:     General: No scleral icterus.    Extraocular Movements: Extraocular movements intact.     Pupils: Pupils are equal, round, and reactive to light.  Neck:     Musculoskeletal: Normal range of motion and neck supple.  Cardiovascular:     Rate and Rhythm: Normal rate.  Pulmonary:     Effort: Pulmonary effort is normal.  Abdominal:     General: Bowel sounds are normal.  Skin:    General: Skin is warm and dry.  Neurological:     Mental Status: She is alert and oriented to person, place, and time.     GCS: GCS eye subscore is 4. GCS verbal subscore is 5. GCS motor subscore is 6.     Cranial Nerves: No cranial nerve deficit, dysarthria or facial asymmetry.     Comments: Right upper extremity had mild drift. Her grip strength was slightly  weak.      ED Treatments / Results  Labs (all labs ordered are listed, but only abnormal results are displayed) Labs Reviewed  CBC WITH DIFFERENTIAL/PLATELET - Abnormal; Notable for the following components:      Result Value   RBC 3.54 (*)    Hemoglobin 11.6 (*)    MCV 106.8 (*)    RDW 15.6 (*)    Lymphs Abs 0.2 (*)    Abs Immature Granulocytes 0.09 (*)    All other components within normal limits  PROTIME-INR - Abnormal; Notable for the following components:   Prothrombin Time 23.2 (*)    INR 2.1 (*)    All other components within normal limits  APTT - Abnormal; Notable for the following components:   aPTT 40 (*)    All other components within normal limits  URINALYSIS, ROUTINE W REFLEX MICROSCOPIC - Abnormal; Notable for the following components:   Color, Urine STRAW (*)    Specific Gravity, Urine 1.003 (*)    Hgb urine dipstick SMALL (*)    Leukocytes,Ua TRACE (*)    Bacteria, UA RARE (*)    All other components within normal limits  BASIC METABOLIC PANEL    EKG None  Radiology Ct Head Wo Contrast  Result Date: 05/30/2018 CLINICAL DATA:  Suspected hemorrhage, history of brain metastases. EXAM: CT HEAD WITHOUT CONTRAST TECHNIQUE: Contiguous axial images were obtained from the base of the skull through the vertex without intravenous contrast. COMPARISON:  MR brain, 04/28/2018, CT brain, 02/17/2018 FINDINGS: Brain: No evidence of acute infarction, hemorrhage, hydrocephalus, or extra-axial collection. Redemonstrated mass of the right aspect of the cerebellum measuring approximately 2.8 x 2.4 cm (series 2, image 10). This mass has some degree of associated edema and is decreased in attenuation compared to prior CT dated 02/17/2018 although does not appear to be changed in size. There is no change in caliber  and configuration of the ventricles to suggest acute obstructive hydrocephalus. No evidence of hemorrhage. Mild small-vessel white matter disease. Vascular: No hyperdense  vessel or unexpected calcification. Skull: Normal. Negative for fracture or focal lesion. Sinuses/Orbits: No acute finding. Other: None. IMPRESSION: Redemonstrated mass of the right aspect of the cerebellum measuring approximately 2.8 x 2.4 cm (series 2, image 10). This mass has some degree of associated edema and is decreased in attenuation compared to prior CT dated 02/17/2018 although does not appear to be changed in size. There is no change in caliber and configuration of the ventricles to suggest acute obstructive hydrocephalus. No evidence of hemorrhage. Electronically Signed   By: Eddie Candle M.D.   On: 05/30/2018 12:35    Procedures Procedures (including critical care time)  Medications Ordered in ED Medications  acetaminophen (TYLENOL) solution 650 mg (650 mg Oral Given 05/30/18 1450)     Initial Impression / Assessment and Plan / ED Course  I have reviewed the triage vital signs and the nursing notes.  Pertinent labs & imaging results that were available during my care of the patient were reviewed by me and considered in my medical decision making (see chart for details).        73 year old female comes in with chief complaint of right-sided weakness that is gotten worse along with some balance issues, headache, nausea, vomiting.  She has known history of metastatic brain cancer.  Her CT scan of the brain does not reveal any new findings of bleed or mass-effect.  I discussed the case with Dr. Mickeal Skinner, neuro-oncology.  He would like the patient to be seen in the clinic soon.  He does not want MRI.  Patient is on Coumadin, and we advised her to continue with the medication given that there is no new bleed.  Basic lab work-up is also showing no acute findings.  Continue with neuro oncology follow-up recommended.  Final Clinical Impressions(s) / ED Diagnoses   Final diagnoses:  Right sided numbness  Bad headache    ED Discharge Orders    None       Varney Biles, MD  05/31/18 1417

## 2018-06-01 ENCOUNTER — Inpatient Hospital Stay: Payer: Medicare Other | Attending: Hematology | Admitting: Internal Medicine

## 2018-06-01 ENCOUNTER — Other Ambulatory Visit: Payer: Self-pay

## 2018-06-01 ENCOUNTER — Encounter: Payer: Self-pay | Admitting: Internal Medicine

## 2018-06-01 ENCOUNTER — Telehealth: Payer: Self-pay | Admitting: Internal Medicine

## 2018-06-01 DIAGNOSIS — C7931 Secondary malignant neoplasm of brain: Secondary | ICD-10-CM

## 2018-06-01 DIAGNOSIS — G4700 Insomnia, unspecified: Secondary | ICD-10-CM | POA: Insufficient documentation

## 2018-06-01 DIAGNOSIS — J449 Chronic obstructive pulmonary disease, unspecified: Secondary | ICD-10-CM | POA: Insufficient documentation

## 2018-06-01 DIAGNOSIS — Z853 Personal history of malignant neoplasm of breast: Secondary | ICD-10-CM | POA: Diagnosis not present

## 2018-06-01 DIAGNOSIS — Z5111 Encounter for antineoplastic chemotherapy: Secondary | ICD-10-CM | POA: Diagnosis not present

## 2018-06-01 DIAGNOSIS — R531 Weakness: Secondary | ICD-10-CM | POA: Diagnosis not present

## 2018-06-01 DIAGNOSIS — I82432 Acute embolism and thrombosis of left popliteal vein: Secondary | ICD-10-CM | POA: Insufficient documentation

## 2018-06-01 DIAGNOSIS — G629 Polyneuropathy, unspecified: Secondary | ICD-10-CM | POA: Insufficient documentation

## 2018-06-01 DIAGNOSIS — G8191 Hemiplegia, unspecified affecting right dominant side: Secondary | ICD-10-CM | POA: Diagnosis not present

## 2018-06-01 DIAGNOSIS — C159 Malignant neoplasm of esophagus, unspecified: Secondary | ICD-10-CM | POA: Diagnosis not present

## 2018-06-01 DIAGNOSIS — R4781 Slurred speech: Secondary | ICD-10-CM | POA: Diagnosis not present

## 2018-06-01 DIAGNOSIS — C50919 Malignant neoplasm of unspecified site of unspecified female breast: Secondary | ICD-10-CM | POA: Diagnosis not present

## 2018-06-01 MED ORDER — DEXAMETHASONE 4 MG PO TABS: ORAL_TABLET | ORAL | 2 refills | Status: DC

## 2018-06-01 NOTE — Telephone Encounter (Signed)
Scheduled appt per 3/19 los.  Cancelled nutrition appt per patient request and cancelled [facility]low appt 3/24 since they seen him today 3/19.  Printed calendar and avs.

## 2018-06-01 NOTE — Progress Notes (Signed)
[facility] [place] [place], [place] [address] [phone]   Interval Evaluation  Date of Service: 06/01/18 Patient Name: Kristin Williams Patient MRN: [id number] Patient DOB: 02/11/1946 Provider: Ventura Sellers, MD  Identifying Statement:  Kristin Williams is a 73 y.o. 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    01/30/2018 Initial Diagnosis    Metastasis to brain [facility])    01/31/2018 Imaging    MRI Brain W WO Contrast 01/31/18  IMPRESSION: 3 Tesla study does not disclose any additional lesions. Solitary metastasis in the right superior cerebellum measuring 2.9 x 2.6 x 3.0 cm with mild surrounding edema. 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    02/03/2018 - 02/08/2018 Radiation Therapy    SBRT with Dr. Isidore Moos on 02/03/18, 02/06/18, 02/08/18     Esophageal cancer, stage IV ([place])   01/08/2018 Imaging    CT AP W Constrast 01/08/18  IMPRESSION: 1. Nonobstructed, nondistended bowel. No acute inflammatory process. 2. Moderate-sized hiatal hernia noted. 3. Low-density 1.7 cm left adrenal nodule likely to represent a small adenoma. Lower pole left renal cyst measuring 1.8 cm. 4. Moderate aortic atherosclerosis. 5. Lumbar spondylosis and facet arthropathy.    01/27/2018 Imaging    CT  Chest W Contrast 01/27/18  IMPRESSION: 1. Large mass involving the distal half of the esophagus spanning approximately 11.7 cm is identified compatible with primary esophageal neoplasm. 2. Small posterior mediastinal and high right paratracheal lymph nodes are noted which may represent foci of metastatic adenopathy. 3. The small pulmonary nodules in the right lung are unchanged from 01/15/2016 and are favored to represent a benign process. 4. Aortic Atherosclerosis (ICD10-I70.0) and Emphysema (ICD10-J43.9). 5. Coronary artery atherosclerotic calcifications.    01/30/2018 Initial Diagnosis    Esophageal cancer, stage IV ([place])    02/03/2018 - 02/08/2018 Radiation Therapy    SBRT to oligo brain metastasis, in 3 fractions, under Dr. Isidore Moos     02/16/2018 PET scan    PET 02/16/18 IMPRESSION: 1. A 15.7 cm in length esophageal mass extends from the aortic arch level down to the distal esophagus, maximum SUV 22.4, compatible with malignancy. There is a small right lower neck level IV lymph node with lower than mediastinal blood pool activity, and a right upper paratracheal lymph node with just above mediastinal blood pool activity. 2. Faint speckled heterogeneity in the liver, for example in the lateral segment left hepatic lobe, is likely incidental/benign given the lack of correlate of CT finding. It might be prudent to obtain an MRI just to make sure that there is not a small early metastatic lesion in this vicinity. 3. Known right cerebellar vermian metastatic lesion, with expected hypo activity compared to the surrounding brain activity. 4. The right middle lobe pulmonary nodule measures 1.1 by 1.0 cm and is mildly hypermetabolic with maximum SUV 2.6. This lesion measured 6  by 5 mm back on 12/06/2003 and 1.2 by 1.1 cm on 10/15/2015. I am uncertain what to make of the very slow growth and mildly accentuated metabolic activity. This could be a chronic granulomatous process. Low-grade  adenocarcinoma of the lung seems unlikely to be so indolent as to only mildly increase over a 14 year period, but is presumably a differential diagnostic consideration. Surveillance is suggested. 5. Other imaging findings of potential clinical significance: Aortic Atherosclerosis (ICD10-I70.0). Descending and sigmoid colon diverticulosis. Gas in the urinary bladder, most common cause would be recent catheterization. Minimal chronic left ethmoid sinusitis.    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 Ultrasound-guided biopsy is recommended.    01/10/2018 Initial Biopsy    Diagnsis from Breast Biopsy 01/10/18  Breast, right, needle core biopsy, 6 o'clock - INVASIVE DUCTAL CARCINOMA WITH CALCIFICATIONS, SEE COMMENT. - DUCTAL CARCINOMA IN SITU. 1 of 2 FINAL for Kristin Williams, Kristin Williams [phone]) Microscopic Comment The carcinoma appears grade 2. Prognostic markers will be ordered. Dr. [name] has reviewed the case. The case was called to [facility] on 01/11/2018.    01/10/2018 Receptors her2    The tumor cells are NEGATIVE for Her2 (1+). Estrogen Receptor: 100%, POSITIVE, STRONG STAINING INTENSITY Progesterone Receptor: 100%, POSITIVE, STRONG STAINING INTENSITY Proliferation Marker Ki67: 15%    01/10/2018 Cancer Staging    Staging form: Breast, AJCC 8th Edition - Clinical stage from 01/10/2018: Stage IA (cT1b, cN0, cM0, G2, ER+, PR+, HER2-) - Signed by [name], MD on 02/03/2018    01/30/2018 Initial  Diagnosis    Breast cancer ([place])    05/07/2018 -  Anti-estrogen oral therapy    Anastrozole '1mg'$  daily starting 05/07/2018     Interval History:  Kristin Williams presents for follow up today.  She did experience significant clinical changes over the past week- sudden onset nausea/vomiting almost every morning, new headaches, blurry vision, worsening gait impairment.  She describes worsening of right sided weakness as well, although she is still ambulating with a walker, able to dress/feed/toilet on her own without assistance.  She recently weaned down to '2mg'$  daily decadron in accordance with symptom onset.   H+P (05/09/18) Patient presents today to discuss neurologic symptoms from brain metastasis.  She describes onset of gait instability, imbalance, and slurred speech in November 2019, which led to uncovering of large metastatic deposit in the cerebellum.  This was treated with fractionated radiosurgery, which did cause temporary improvement in symptomatology and overall functional status.  Steroids were successfully weaned following radiation, but later restarted by Dr. Burr Medico. Starting ~2 months later, she began to experience recurrent decline in gait and speech.  MRI demonstrated inflammatory process around radiosurgery site, and decadron was increased to '4mg'$  TID with plan for gradual taper by Dr. Isidore Moos.  Now one week into therapy, she has not noticed any improvement.  She continues to ambulate with a walker, but denies any issues with memory, cognition, language, or coordination.  She does describe increased difficulty getting up stairs and up from the commode.  Otherwise  able to write, use utensils, and button her shirt.  Currently on anastrozole for breast ca and will discuss today with Dr. Burr Medico plans for systemic therapy for esophageal cancer, now having completed local radiation.     Medications: Current Outpatient Medications on File Prior to Visit  Medication Sig Dispense Refill   acetaminophen  (TYLENOL) 160 MG/5ML liquid Take 325 mg by mouth every 4 (four) hours as needed for fever.     albuterol (PROVENTIL HFA;VENTOLIN HFA) 108 (90 Base) MCG/ACT inhaler Inhale 2 puffs into the lungs every 6 (six) hours as needed for wheezing or shortness of breath.     anastrozole (ARIMIDEX) 1 MG tablet Take 1 tablet (1 mg total) by mouth daily. 30 tablet 2   dexamethasone (DECADRON) 4 MG tablet Taper as instructed (Patient taking differently: Take 2 mg by mouth daily. Until 06/01/18) 45 tablet 0   furosemide (LASIX) 20 MG tablet Take 1 tablet (20 mg total) by mouth every other day. 15 tablet 0   HYDROcodone-acetaminophen (HYCET) 7.5-325 mg/15 ml solution Take 7.5 mLs by mouth every 8 (eight) hours as needed for moderate pain. (Patient not taking: Reported on 05/02/2018) 250 mL 0   HYDROcodone-homatropine (HYCODAN) 5-1.5 MG/5ML syrup Take 5 mLs by mouth every 6 (six) hours as needed for cough. (Patient not taking: Reported on 05/02/2018) 120 mL 0   lidocaine (XYLOCAINE) 2 % solution MIX 1 PART 2% VISCOUS LIDOCAINE, 1 PART H20 SWALLOW 10ML OF DILUTED MIXTURE, 20 MIN BEFORE MEALS AND AT BEDTIME UP TO 4 TIMES DAILY 100 mL 0   lidocaine-prilocaine (EMLA) cream Apply 1 application topically as needed. 30 g 1   LORazepam (ATIVAN) 1 MG tablet Take 1 tablet (1 mg total) by mouth 3 (three) times daily as needed for anxiety. 15 tablet 0   metoCLOPramide (REGLAN) 10 MG tablet TAKE 1 TABLET BY MOUTH EVERY 6 HOURS AS NEEDED FOR NAUSEA 60 tablet 0   Morphine Sulfate (MORPHINE CONCENTRATE) 10 mg / 0.5 ml concentrated solution Take 0.5 mLs (10 mg total) by mouth every 6 (six) hours as needed for severe pain. (Patient not taking: Reported on 04/19/2018) 30 mL 0   omeprazole (PRILOSEC) 40 MG capsule Take 40 mg by mouth daily.     ondansetron (ZOFRAN) 8 MG tablet Take 1 tablet (8 mg total) by mouth every 8 (eight) hours as needed for nausea or vomiting. (Patient not taking: Reported on 05/30/2018) 20 tablet 0    POTASSIUM CHLORIDE PO Take 20 mEq by mouth daily.      sucralfate (CARAFATE) 1 g tablet DISSOLVE ONE TABLET IN 10 MLS OF WATER AND SWALLOW UP TO FOUR TIMES DAILY TO SOOTHE THROAT 40 tablet 0   warfarin (COUMADIN) 5 MG tablet Take 1 tablet (5 mg total) by mouth daily. 30 tablet 3   No current facility-administered medications on file prior to visit.     Allergies:  Allergies  Allergen Reactions   Flonase [Fluticasone Propionate] Other (See Comments)    Nose bleeds   Past Medical History:  Past Medical History:  Diagnosis Date   Arthritis    Cancer ([place])    Cervical   Cancer ([place])    Breast   Cancer ([place])    Vaginal   Complication of anesthesia    COPD (chronic obstructive pulmonary disease) ([place])    Headache(784.0)    History of radiation therapy 02/28/19- 04/07/18   Esophagus, 1.8 Gy in 28 fractions for a total dose of 50.4 Gy.    History of  radiation therapy 02/03/18- 02/08/18   Brain Rt Sup Cerebellum// 6 FFF photons.    Hyperlipidemia    Leg pain    Peripheral vascular disease ([facility])    Personal history of chemotherapy    Personal history of radiation therapy    PONV (postoperative nausea and vomiting)    Past Surgical History:  Past Surgical History:  Procedure Laterality Date   ABDOMINAL HYSTERECTOMY  1975   BREAST LUMPECTOMY  2005   GANGLION CYST EXCISION  1977   ILIAC ARTERY STENT  04/20/2004   CSD right iliac occlusive disease   IR IMAGING GUIDED PORT INSERTION  02/22/2018   Social History:  Social History   Socioeconomic History   Marital status: Married    Spouse name: Not on file   Number of children: 2   Years of education: Not on file   Highest education level: Not on file  Occupational History   Not on file  Social Needs   Financial resource strain: Not on file   Food insecurity:    Worry: Not on file    Inability: Not on file   Transportation needs:    Medical: No    Non-medical: No  Tobacco Use   Smoking  status: Former Smoker    Packs/day: 0.50    Years: 40.00    Pack years: 20.00    Types: Cigarettes    Last attempt to quit: 01/27/2018    Years since quitting: 0.3   Smokeless tobacco: Never Used   Tobacco comment: She admits to smoking a few puffs daily- 05/02/18  Substance and Sexual Activity   Alcohol use: No   Drug use: No   Sexual activity: Not Currently  Lifestyle   Physical activity:    Days per week: Not on file    Minutes per session: Not on file   Stress: Not on file  Relationships   Social connections:    Talks on phone: Not on file    Gets together: Not on file    Attends religious service: Not on file    Active member of club or organization: Not on file    Attends meetings of clubs or organizations: Not on file    Relationship status: Not on file   Intimate partner violence:    Fear of current or ex partner: No    Emotionally abused: No    Physically abused: No    Forced sexual activity: No  Other Topics Concern   Not on file  Social History Narrative   Resides in [place]. Resides with her husband.    Family History:  Family History  Problem Relation Age of Onset   Cancer Mother 60       Non-Hodgkins Disease T-Cell   Heart failure Father    COPD Father    Heart disease Brother 44       MI and Heart Disease before age 42   Heart attack Brother    Lung cancer Maternal Uncle    Cancer Cousin    Prostate cancer Maternal Uncle    Prostate cancer Brother        half brother   Breast cancer Neg Hx     Review of Systems: Constitutional: Denies fevers, chills or abnormal weight loss Eyes: Denies blurriness of vision Ears, nose, mouth, throat, and face: Denies mucositis or sore throat Respiratory: Denies cough, dyspnea or wheezes Cardiovascular: Denies palpitation, chest discomfort or lower extremity swelling Gastrointestinal:  +dyshpagia, odynophagia GU: Denies dysuria or incontinence Skin:  Denies abnormal skin  rashes Neurological: Per HPI Musculoskeletal: Denies joint pain, back or neck discomfort. No decrease in ROM Behavioral/Psych: Denies anxiety, disturbance in thought content, and mood instability   Physical Exam: Vitals:   06/01/18 1156  BP: 122/64  Pulse: 80  Resp: 18  Temp: 98.5 F (36.9 C)  SpO2: 99%   KPS: 70. General: Alert, cooperative, pleasant, in no acute distress Head: Craniotomy scar noted, dry and intact. EENT: No conjunctival injection or scleral icterus. Oral mucosa moist Lungs: Resp effort normal Cardiac: Regular rate and rhythm Abdomen: Soft, non-distended abdomen Skin: No rashes cyanosis or petechiae. Extremities: No clubbing or edema  Neurologic Exam: Mental Status: Awake, alert, attentive to examiner. Oriented to self and environment. Language is fluent with intact comprehension.  Cranial Nerves: Visual acuity is grossly normal. Visual fields are full. Extra-ocular movements intact. No ptosis. Face is symmetric, tongue midline. Motor: Tone and bulk are normal. Power is full in both arms and legs except for mild symmetric weakness in hip girdle. Reflexes are symmetric, no pathologic reflexes present. Subtle impairment in fine coordination only. Sensory: Intact to light touch and temperature Gait: Wide base gait   Labs: I have reviewed the data as listed    Component Value Date/Time   NA 138 05/30/2018 1302   K 3.6 05/30/2018 1302   CL 103 05/30/2018 1302   CO2 27 05/30/2018 1302   GLUCOSE 99 05/30/2018 1302   BUN 10 05/30/2018 1302   CREATININE 0.64 05/30/2018 1302   CREATININE 0.82 05/09/2018 1235   CALCIUM 9.2 05/30/2018 1302   PROT 6.8 05/09/2018 1235   ALBUMIN 3.5 05/09/2018 1235   AST 14 (L) 05/09/2018 1235   ALT 19 05/09/2018 1235   ALKPHOS 100 05/09/2018 1235   BILITOT 0.4 05/09/2018 1235   GFRNONAA >60 05/30/2018 1302   GFRNONAA >60 05/09/2018 1235   GFRAA >60 05/30/2018 1302   GFRAA >60 05/09/2018 1235   Lab Results  Component Value  Date   WBC 6.3 05/30/2018   NEUTROABS 5.6 05/30/2018   HGB 11.6 (L) 05/30/2018   HCT 37.8 05/30/2018   MCV 106.8 (H) 05/30/2018   PLT 151 05/30/2018    Imaging:  Ct Head Wo Contrast  Result Date: 05/30/2018 CLINICAL DATA:  Suspected hemorrhage, history of brain metastases. EXAM: CT HEAD WITHOUT CONTRAST TECHNIQUE: Contiguous axial images were obtained from the base of the skull through the vertex without intravenous contrast. COMPARISON:  MR brain, 04/28/2018, CT brain, 02/17/2018 FINDINGS: Brain: No evidence of acute infarction, hemorrhage, hydrocephalus, or extra-axial collection. Redemonstrated mass of the right aspect of the cerebellum measuring approximately 2.8 x 2.4 cm (series 2, image 10). This mass has some degree of associated edema and is decreased in attenuation compared to prior CT dated 02/17/2018 although does not appear to be changed in size. There is no change in caliber and configuration of the ventricles to suggest acute obstructive hydrocephalus. No evidence of hemorrhage. Mild small-vessel white matter disease. Vascular: No hyperdense vessel or unexpected calcification. Skull: Normal. Negative for fracture or focal lesion. Sinuses/Orbits: No acute finding. Other: None. IMPRESSION: Redemonstrated mass of the right aspect of the cerebellum measuring approximately 2.8 x 2.4 cm (series 2, image 10). This mass has some degree of associated edema and is decreased in attenuation compared to prior CT dated 02/17/2018 although does not appear to be changed in size. There is no change in caliber and configuration of the ventricles to suggest acute obstructive hydrocephalus. No evidence of hemorrhage. Electronically Signed  By: Eddie Candle M.D.   On: 05/30/2018 12:35    [place] Clinician Interpretation: I have personally reviewed the radiological images as listed.  My interpretation, in the context of the patient's clinical presentation, is post-RT inflammatory  changes   Assessment/Plan 1. Metastasis to brain [facility])  Ms. Maradiaga has clinical changes and CT head findings strongly suggestive of inflammatory changes from radiation affecting the posterior fossa.  Also supporting this etiology is her recent weaning of dexamethasone.     We recommended increasing dexamethasone as follows: -79m daily for 2 days -457mdaily therafter  She should follow up with [facility] 2 weeks for evaluation before proceeding further with taper.   Next MRI brain should be performed in 2 months.  We appreciate the opportunity to participate in the care of BeJUHI LAGRANGE  All questions were answered. The patient knows to call the clinic with any problems, questions or concerns. No barriers to learning were detected.  The total time spent in the encounter was 25 minutes and more than 50% was on counseling and review of test results   ZaVentura SellersMD Medical Director of Neuro-Oncology [facility]3/19/20 11:53 AM

## 2018-06-02 NOTE — Progress Notes (Signed)
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    01/30/2018 Initial Diagnosis    Metastasis to brain [facility])    01/31/2018 Imaging    MRI Brain W WO Contrast 01/31/18  IMPRESSION: 3 Tesla study does not disclose any additional lesions. Solitary metastasis in the right superior cerebellum measuring 2.9 x 2.6 x 3.0 cm with mild surrounding edema. 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    02/03/2018 - 02/08/2018 Radiation Therapy    SBRT with Dr. Isidore Moos on 02/03/18, 02/06/18, 02/08/18     Esophageal cancer, stage IV ([name])   01/08/2018 Imaging    CT AP W Constrast 01/08/18  IMPRESSION: 1. Nonobstructed, nondistended bowel. No acute inflammatory process. 2. Moderate-sized hiatal hernia noted. 3. Low-density 1.7 cm left adrenal nodule likely to represent a small adenoma. Lower pole left renal cyst measuring 1.8 cm. 4. Moderate aortic atherosclerosis. 5. Lumbar spondylosis and facet arthropathy.    01/27/2018 Imaging    CT Chest W Contrast 01/27/18  IMPRESSION: 1. Large mass involving the distal half of the esophagus spanning approximately 11.7 cm is identified compatible with primary esophageal  neoplasm. 2. Small posterior mediastinal and high right paratracheal lymph nodes are noted which may represent foci of metastatic adenopathy. 3. The small pulmonary nodules in the right lung are unchanged from 01/15/2016 and are favored to represent a benign process. 4. Aortic Atherosclerosis (ICD10-I70.0) and Emphysema (ICD10-J43.9). 5. Coronary artery atherosclerotic calcifications.    01/30/2018 Initial Diagnosis    Esophageal cancer, stage IV ([name])    02/03/2018 - 02/08/2018 Radiation Therapy    SBRT to oligo brain metastasis, in 3 fractions, under Dr. Isidore Moos     02/16/2018 PET scan    PET 02/16/18 IMPRESSION: 1. A 15.7 cm in length esophageal mass extends from the aortic arch level down to the distal esophagus, maximum SUV 22.4, compatible with malignancy. There is a small right lower neck level IV lymph node with lower than mediastinal blood pool activity, and a right upper paratracheal lymph node with just above mediastinal blood pool activity. 2. Faint speckled heterogeneity in the liver, for example in the lateral segment left hepatic lobe, is likely incidental/benign given the lack of correlate of CT finding. It might be prudent to obtain an MRI just to make sure that there is not a small early metastatic lesion in this vicinity. 3. Known right cerebellar vermian metastatic lesion, with expected hypo activity compared to the surrounding brain activity. 4. The right middle lobe pulmonary nodule measures 1.1 by 1.0 cm and is mildly hypermetabolic with maximum SUV 2.6. This lesion measured 6 by 5 mm back on 12/06/2003 and 1.2 by 1.1 cm on 10/15/2015. I am uncertain what to make of the very slow growth and mildly  accentuated metabolic activity. This could be a chronic granulomatous process. Low-grade adenocarcinoma of the lung seems unlikely to be so indolent as to only mildly increase over a 14 year period, but is presumably a differential diagnostic consideration.  Surveillance is suggested. 5. Other imaging findings of potential clinical significance: Aortic Atherosclerosis (ICD10-I70.0). Descending and sigmoid colon diverticulosis. Gas in the urinary bladder, most common cause would be recent catheterization. Minimal chronic left ethmoid sinusitis.    02/27/2018 - 04/03/2018 Chemotherapy    Concurrent chemoRT with weekly carboplatin and Taxol 02/27/2018-04/03/18    02/27/2018 - 04/07/2018 Radiation Therapy    Concurrent ChemoRT 02/27/18-04/07/18     Chemotherapy    Pending FOLFOX q2weeks starting next week    06/12/2018 -  Chemotherapy    The patient had palonosetron (ALOXI) injection 0.25 mg, 0.25 mg, Intravenous,  Once, 0 of 6 cycles leucovorin 764 mg in dextrose 5 % 250 mL infusion, 400 mg/m2 = 764 mg, Intravenous,  Once, 0 of 6 cycles oxaliplatin (ELOXATIN) 160 mg in dextrose 5 % 500 mL chemo infusion, 85 mg/m2 = 160 mg, Intravenous,  Once, 0 of 6 cycles fluorouracil (ADRUCIL) 4,600 mg in sodium chloride 0.9 % 58 mL chemo infusion, 2,400 mg/m2 = 4,600 mg, Intravenous, 1 Day/Dose, 0 of 6 cycles  for chemotherapy treatment.      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 Ultrasound-guided biopsy is recommended.    01/10/2018 Initial Biopsy    Diagnsis from Breast Biopsy 01/10/18  Breast, right, needle core biopsy, 6 o'clock - INVASIVE DUCTAL CARCINOMA WITH CALCIFICATIONS, SEE COMMENT. - DUCTAL CARCINOMA IN SITU. 1 of 2 FINAL for [name], [name] [phone]) Microscopic Comment The carcinoma appears grade 2. Prognostic markers will be ordered. Dr. [name] has reviewed the case. The case was called to [name] on 01/11/2018.    01/10/2018 Receptors  her2    The tumor cells are NEGATIVE for Her2 (1+). Estrogen Receptor: 100%, POSITIVE, STRONG STAINING INTENSITY Progesterone Receptor: 100%, POSITIVE, STRONG STAINING INTENSITY Proliferation Marker Ki67: 15%    01/10/2018 Cancer Staging    Staging form: Breast, AJCC 8th Edition - Clinical stage from 01/10/2018: Stage IA (cT1b, cN0, cM0, G2, ER+, PR+, HER2-) - Signed by [name], MD on 02/03/2018    01/30/2018 Initial Diagnosis    Breast cancer ([place])    05/07/2018 -  Anti-estrogen oral therapy    Anastrozole 56m daily starting 05/07/2018      CURRENT THERAPY:  -Anastrozole 168mdaily started 05/07/2018 -PENDING FOLFOX q2weeks starting next week  INTERVAL HISTORY:  BeDONIKA BUTNERs here for a follow up of breast and esophogeal cancer. She presents to the clinic today by herself. I called her daughter to include her in the visit.  She saw Dr. VaMickeal Skinnerast week who increased her steroids. She notes she feels the same with no change in slurred speech and lack of control of right extremity and balance. She is currently on 34m95mnce daily and this also keeps her up at night. She continues to use walker and cane. This also effects her sleep with insomnia. She has been using Ativan 5 mg without much improvement.   She notes the feeling discomfort in her chest with eating occasionally. She has  no diet restriction currently but notes the first bite gets stuck. She takes a drink and this discomfort resolves. This gives her apprehension about eating in general. She also notes some trouble taking pills. She denies choking or cough with consumption except once. She denies any other chest pain or abdominal pain.  She notes Kristin tiny bruises after starting anastrozole on her breasts and abdomen. She denies any pain or itching from this.    REVIEW OF SYSTEMS:   Constitutional: Denies fevers, chills or abnormal weight loss (+) Insomnia  Eyes: Denies blurriness of vision Ears, nose, mouth, throat, and  face: Denies mucositis or sore throat (+) Mild odynophagia  Respiratory: Denies dyspnea or wheezes (+) Cough  Cardiovascular: Denies palpitation, chest discomfort or lower extremity swelling Gastrointestinal:  Denies nausea, heartburn or change in bowel habits Skin: Denies abnormal skin rashes (+) tiny Bruising of breasts and abdomen Lymphatics: Denies Kristin lymphadenopathy or easy bruising Neurological:Denies numbness, tingling or Kristin weaknesses (+) Stable slurred speech, change in gait and control of right extremities.  Behavioral/Psych: Mood is stable, no Kristin changes  All other systems were reviewed with the patient and are negative.  MEDICAL HISTORY:  Past Medical History:  Diagnosis Date  . Arthritis   . Cancer (H[facility])    Cervical  . Cancer ([place])    Breast  . Cancer ([place])    Vaginal  . Complication of anesthesia   . COPD (chronic obstructive pulmonary disease) ([place])   . Headache(784.0)   . History of radiation therapy 02/28/19- 04/07/18   Esophagus, 1.8 Gy in 28 fractions for a total dose of 50.4 Gy.   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   . Hyperlipidemia   . Leg pain   . Peripheral vascular disease ([place])   . Personal history of chemotherapy   . Personal history of radiation therapy   . PONV (postoperative nausea and vomiting)     SURGICAL HISTORY: Past Surgical History:  Procedure Laterality Date  . ABDOMINAL HYSTERECTOMY  1975  . BREAST LUMPECTOMY  2005  . GANGLION CYST EXCISION  1977  . ILIAC ARTERY STENT  04/20/2004   CSD right iliac occlusive disease  . IR IMAGING GUIDED PORT INSERTION  02/22/2018    I have reviewed the social history and family history with the patient and they are unchanged from previous note.  ALLERGIES:  is allergic to flonase [fluticasone propionate].  MEDICATIONS:  Current Outpatient Medications  Medication Sig Dispense Refill  . acetaminophen (TYLENOL) 160 MG/5ML liquid Take 325  mg by mouth every 4 (four) hours as needed for fever.    Marland Kitchen albuterol (PROVENTIL HFA;VENTOLIN HFA) 108 (90 Base) MCG/ACT inhaler Inhale 2 puffs into the lungs every 6 (six) hours as needed for wheezing or shortness of breath.    . anastrozole (ARIMIDEX) 1 MG tablet Take 1 tablet (1 mg total) by mouth daily. 30 tablet 2  . dexamethasone (DECADRON) 4 MG tablet Take 78m daily in the AM 30 tablet 2  . furosemide (LASIX) 20 MG tablet Take 1 tablet (20 mg total) by mouth every other day. 15 tablet 0  . lidocaine (XYLOCAINE) 2 % solution MIX 1 PART 2% VISCOUS LIDOCAINE, 1 PART H20 SWALLOW 10ML OF DILUTED MIXTURE, 20 MIN BEFORE MEALS AND AT BEDTIME UP TO 3 TIMES DAILY 300 mL 0  . lidocaine-prilocaine (EMLA) cream Apply 1 application topically as needed. 30 g 1  . LORazepam (ATIVAN) 1 MG tablet Take  1 tablet (1 mg total) by mouth 3 (three) times daily as needed for anxiety. 15 tablet 0  . metoCLOPramide (REGLAN) 10 MG tablet TAKE 1 TABLET BY MOUTH EVERY 6 HOURS AS NEEDED FOR NAUSEA 60 tablet 0  . omeprazole (PRILOSEC) 40 MG capsule Take 40 mg by mouth daily.    . ondansetron (ZOFRAN) 8 MG tablet Take 1 tablet (8 mg total) by mouth every 8 (eight) hours as needed for nausea or vomiting. 20 tablet 0  . POTASSIUM CHLORIDE PO Take 20 mEq by mouth daily.     . sucralfate (CARAFATE) 1 g tablet DISSOLVE ONE TABLET IN 10 MLS OF WATER AND SWALLOW UP TO FOUR TIMES DAILY TO SOOTHE THROAT 40 tablet 0  . warfarin (COUMADIN) 5 MG tablet Take 1 tablet (5 mg total) by mouth daily. 30 tablet 3   No current facility-administered medications for this visit.     PHYSICAL EXAMINATION: ECOG PERFORMANCE STATUS: 3 - Symptomatic, >50% confined to bed  Vitals:   06/05/18 0953  BP: 140/76  Pulse: 79  Resp: 17  Temp: (!) 97.5 F (36.4 C)  SpO2: 98%   Filed Weights   06/05/18 0953  Weight: 189 lb 12.8 oz (86.1 kg)    GENERAL:alert, no distress and comfortable SKIN: skin color, texture, turgor are normal, no rashes or  significant lesions (+) tiny bruising spots along breasts and abdomen  EYES: normal, Conjunctiva are pink and non-injected, sclera clear OROPHARYNX:no exudate, no erythema and lips, buccal mucosa, and tongue normal  NECK: supple, thyroid normal size, non-tender, without nodularity LYMPH:  no palpable lymphadenopathy in the cervical, axillary or inguinal LUNGS: clear to auscultation and percussion with normal breathing effort HEART: regular rate & rhythm and no murmurs and no lower extremity edema ABDOMEN:abdomen soft, non-tender and normal bowel sounds Musculoskeletal:no cyanosis of digits and no clubbing  NEURO: alert & oriented x 3 with fluent speech, no focal motor/sensory deficits  LABORATORY DATA:  I have reviewed the data as listed CBC Latest Ref Rng & Units 06/05/2018 05/30/2018 05/09/2018  WBC 4.0 - 10.5 K/uL 6.5 6.3 8.2  Hemoglobin 12.0 - 15.0 g/dL 12.1 11.6(L) 12.1  Hematocrit 36.0 - 46.0 % 37.3 37.8 36.7  Platelets 150 - 400 K/uL 182 151 179     CMP Latest Ref Rng & Units 06/05/2018 05/30/2018 05/09/2018  Glucose 70 - 99 mg/dL 85 99 101(H)  BUN 8 - 23 mg/dL 14 10 24(H)  Creatinine 0.44 - 1.00 mg/dL 0.74 0.64 0.82  Sodium 135 - 145 mmol/L 136 138 134(L)  Potassium 3.5 - 5.1 mmol/L 4.0 3.6 4.8  Chloride 98 - 111 mmol/L 104 103 101  CO2 22 - 32 mmol/L '23 27 22  ' Calcium 8.9 - 10.3 mg/dL 9.1 9.2 9.5  Total Protein 6.5 - 8.1 g/dL 6.0(L) - 6.8  Total Bilirubin 0.3 - 1.2 mg/dL 0.3 - 0.4  Alkaline Phos 38 - 126 U/L 94 - 100  AST 15 - 41 U/L 16 - 14(L)  ALT 0 - 44 U/L 22 - 19      RADIOGRAPHIC STUDIES: I have personally reviewed the radiological images as listed and agreed with the findings in the report. No results found.   ASSESSMENT & PLAN:  Kristin Williams is a 73 y.o. female with   1.EsophagealAdenocarcinoma,with oligo met incerebellum, stage IV , HER2(-), MSS, and PD-L1(-) -She was diagnosed with esophogeal cancerin 01/2018,with oligometastasisto the brain.  Given her stage IV disease her cancer is not curable but still treatable. Goal  of care it to control her disease.  -She completedSBRTto her brain meton11/27/2019and completed chemoRTto primary tumoron 04/07/18. -Labs reviewed, CBC WNL overall, CMP WNL.  -She is clinically stable with persistent slurred speech, gait change, secondary to her brain mets, and mild odynophagia secondary to her primary tumor.  She overall has recovered well from her chemo and radiation which completed 2 months ago. -I discussed proceeding with consolidation chemotherapy treatment with FOLFOX q2weeks for 3 months to better control her disease. She is agreeable. Plan to start next week.  --Chemotherapy consent: Side effects including but does not not limited to, fatigue, nausea, vomiting, diarrhea, hair loss, neuropathy, fluid retention, renal and kidney dysfunction, neutropenic fever, needed for blood transfusion, bleeding, were discussed with patient in great detail. She agrees to proceed. Her daughter Maudie Mercury was on the phone during her office visit, and she agreed with chemo treatment. -The goal of therapy is palliative, for disease control. -Her molecular testing on the tumor showed MSI stable disease, PD-L1 negative, she is not a candidate for immunotherapy.  Her2 was also negative, not a candidate for EGFR inhibitor. -F/u in 1 week  2. Dysphagia andodynophagia -secondary toher esophageal cancer and chemoradiation. -she is recovering well overall. She has not needed pain medication lately -HerOdynophagiais only present with her initial start to eating and taking certain pills. Weight remains stable. I encouraged her to crush her pills if possible.  -Continue Lidocaine and Sulcrafate with eating. I encouraged her to not take Lidocaine more than 3 times a day to prevent over exposure to heart. I refilled today    3. RightBreastInvasive Ductal Carcinoma,cT1bN0M0 stage IA,Grade II, ER/PR Positive, HER 2  negative -Diagnosed through screening mammogram, same time as her esophageal cancer in October 2019.  -Given her early stage breast cancer, I previously discussedher breast cancer treatment is less urgent than esophogeal cancertreatment. -Given her EP/PR breast cancer and post-menopausal status, she started antiestrogen therapy with anastrozole on 05/07/18, and tolerating well except recent tiny bruising of her breasts and abdomen. Will monitor.  -Her breasts surgery has been held due to her metastatic esophageal cancer. -She has developed mild skin rash on her chest and abdomen after she started anastrozole, not symptomatic, will continue monitoring.  4.Oligo brain mets, Insomnia  -This is likely from her primaryesophogealcancer.  -She does have balance issues and ambulates with assistance.  -She is s/p SBRT. Hermild slurred speech, right sided weaknessand balance stabilityhas muchimproved -Will monitor with brain scan every 3 months. -She had a brain MRI on 04/28/2018 and shows decreases in brain met but has considerable increase in edema of cerebellum -She has been seen by Dr. Mickeal Skinner last week who increased her dexa to 77m daily. Pt feels her slurred speech, less control of right upper extremity and changed gait has remained stable. This also impacts her sleep.  -She will increase her ativan to 1.5 tabs at night as needed. I also reviewed sleep hygiene with her.  -Continue using walker and cane to ambulate.   5. H/o Left breast cancer, ER/PR negative, treated by Dr. MJana Hakimwith lumpectomy, adj, chemo and radiation. -She was previously referred to genetics  -no evidence of recurrence   6.Acute deep vein thrombosis (DVT) of popliteal vein of left lower extremity, diagnosed on 02/28/2018  -She was found to have left lower extremity edema on his first day of chemoradiation, Doppler showed DVT -She is on coumadin 517mdaily, INR 2.4 today (06/05/18), therapeutic, will continue  Coumadin at the same dose.  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  -Stable  8.Goal of care discussion  -she is full code now -Patient understand her treatment is palliative to prolong her life,her cancer is not curable unfortunately.  9.COPD -Shehas chronic cough -She presented to ED for COPD excerebration on 04/12/18. She has recovered and her breathing is improved. -stable     PLAN: -I will refill liquid Lidocaine today  -Continue coumadin same dose  -Continue anastrozole  -Lab, flush f/u and chemo FOLFOX in 1, 3 and 5 weeks    No problem-specific Assessment & Plan notes found for this encounter.   No orders of the defined types were placed in this encounter.  All questions were answered. The patient knows to call the clinic with any problems, questions or concerns. No barriers to learning was detected. I spent 20 minutes counseling the patient face to face. The total time spent in the appointment was 25 minutes and more than 50% was on counseling and review of test results     [name], MD 06/05/2018   I, Joslyn Devon, am acting as scribe for [name], MD.   I have reviewed the above documentation for accuracy and completeness, and I agree with the above.

## 2018-06-05 ENCOUNTER — Inpatient Hospital Stay: Payer: Medicare Other

## 2018-06-05 ENCOUNTER — Telehealth: Payer: Self-pay | Admitting: Hematology

## 2018-06-05 ENCOUNTER — Inpatient Hospital Stay (HOSPITAL_BASED_OUTPATIENT_CLINIC_OR_DEPARTMENT_OTHER): Payer: Medicare Other | Admitting: Hematology

## 2018-06-05 ENCOUNTER — Encounter: Payer: Self-pay | Admitting: Hematology

## 2018-06-05 ENCOUNTER — Other Ambulatory Visit: Payer: Self-pay

## 2018-06-05 DIAGNOSIS — J449 Chronic obstructive pulmonary disease, unspecified: Secondary | ICD-10-CM | POA: Diagnosis not present

## 2018-06-05 DIAGNOSIS — Z853 Personal history of malignant neoplasm of breast: Secondary | ICD-10-CM

## 2018-06-05 DIAGNOSIS — C50911 Malignant neoplasm of unspecified site of right female breast: Secondary | ICD-10-CM

## 2018-06-05 DIAGNOSIS — G4700 Insomnia, unspecified: Secondary | ICD-10-CM | POA: Diagnosis not present

## 2018-06-05 DIAGNOSIS — C159 Malignant neoplasm of esophagus, unspecified: Secondary | ICD-10-CM

## 2018-06-05 DIAGNOSIS — C7931 Secondary malignant neoplasm of brain: Secondary | ICD-10-CM

## 2018-06-05 DIAGNOSIS — Z7189 Other specified counseling: Secondary | ICD-10-CM

## 2018-06-05 DIAGNOSIS — Z5111 Encounter for antineoplastic chemotherapy: Secondary | ICD-10-CM | POA: Diagnosis not present

## 2018-06-05 DIAGNOSIS — G629 Polyneuropathy, unspecified: Secondary | ICD-10-CM

## 2018-06-05 DIAGNOSIS — C154 Malignant neoplasm of middle third of esophagus: Secondary | ICD-10-CM

## 2018-06-05 DIAGNOSIS — I824Z2 Acute embolism and thrombosis of unspecified deep veins of left distal lower extremity: Secondary | ICD-10-CM

## 2018-06-05 DIAGNOSIS — I82432 Acute embolism and thrombosis of left popliteal vein: Secondary | ICD-10-CM

## 2018-06-05 LAB — CBC WITH DIFFERENTIAL (CANCER CENTER ONLY)
Abs Immature Granulocytes: 0.34 10*3/uL — ABNORMAL HIGH (ref 0.00–0.07)
BASOS PCT: 1 %
Basophils Absolute: 0 10*3/uL (ref 0.0–0.1)
Eosinophils Absolute: 0 10*3/uL (ref 0.0–0.5)
Eosinophils Relative: 0 %
HCT: 37.3 % (ref 36.0–46.0)
Hemoglobin: 12.1 g/dL (ref 12.0–15.0)
Immature Granulocytes: 5 %
LYMPHS ABS: 0.5 10*3/uL — AB (ref 0.7–4.0)
Lymphocytes Relative: 7 %
MCH: 32.9 pg (ref 26.0–34.0)
MCHC: 32.4 g/dL (ref 30.0–36.0)
MCV: 101.4 fL — AB (ref 80.0–100.0)
Monocytes Absolute: 0.5 10*3/uL (ref 0.1–1.0)
Monocytes Relative: 8 %
NRBC: 0.5 % — AB (ref 0.0–0.2)
Neutro Abs: 5.1 10*3/uL (ref 1.7–7.7)
Neutrophils Relative %: 79 %
Platelet Count: 182 10*3/uL (ref 150–400)
RBC: 3.68 MIL/uL — ABNORMAL LOW (ref 3.87–5.11)
RDW: 15.1 % (ref 11.5–15.5)
WBC Count: 6.5 10*3/uL (ref 4.0–10.5)

## 2018-06-05 LAB — CMP (CANCER CENTER ONLY)
ALT: 22 U/L (ref 0–44)
AST: 16 U/L (ref 15–41)
Albumin: 3 g/dL — ABNORMAL LOW (ref 3.5–5.0)
Alkaline Phosphatase: 94 U/L (ref 38–126)
Anion gap: 9 (ref 5–15)
BUN: 14 mg/dL (ref 8–23)
CO2: 23 mmol/L (ref 22–32)
CREATININE: 0.74 mg/dL (ref 0.44–1.00)
Calcium: 9.1 mg/dL (ref 8.9–10.3)
Chloride: 104 mmol/L (ref 98–111)
GFR, Est AFR Am: >60 mL/min (ref >60)
GFR, Estimated: >60 mL/min (ref >60)
Glucose, Bld: 85 mg/dL (ref 70–99)
Potassium: 4 mmol/L (ref 3.5–5.1)
Sodium: 136 mmol/L (ref 135–145)
Total Bilirubin: 0.3 mg/dL (ref 0.3–1.2)
Total Protein: 6 g/dL — ABNORMAL LOW (ref 6.5–8.1)

## 2018-06-05 LAB — PROTIME-INR
INR: 2.4 — ABNORMAL HIGH (ref 0.8–1.2)
Prothrombin Time: 25.7 seconds — ABNORMAL HIGH (ref 11.4–15.2)

## 2018-06-05 MED ORDER — LIDOCAINE VISCOUS HCL 2 % MT SOLN: OROMUCOSAL | 0 refills | Status: DC

## 2018-06-05 NOTE — Progress Notes (Signed)
DISCONTINUE OFF PATHWAY REGIMEN - Gastroesophageal   [id number]:Carboplatin + Paclitaxel (2/50) + RT weekly x 6 weeks:   Administer weekly during RT:     Paclitaxel      Carboplatin   **Always confirm dose/schedule in your pharmacy ordering system**  REASON: Other Reason PRIOR TREATMENT: Off Pathway: Carboplatin + Paclitaxel (2/50) + RT weekly x 6 weeks TREATMENT RESPONSE: Unable to Evaluate  START ON PATHWAY REGIMEN - Gastroesophageal     A cycle is every 14 days:     Oxaliplatin      Leucovorin      5-Fluorouracil      5-Fluorouracil   **Always confirm dose/schedule in your pharmacy ordering system**  Patient Characteristics: Distant Metastases (cM1/pM1) / Locally Recurrent Disease, Adenocarcinoma - Esophageal, [place], and Gastric, First Line, HER2 Negative / Unknown Histology: Adenocarcinoma Disease Classification: Esophageal Therapeutic Status: Distant Metastases (No Additional Staging) Line of Therapy: First Line HER2 Status: Negative Intent of Therapy: Non-Curative / Palliative Intent, Discussed with Patient

## 2018-06-05 NOTE — Telephone Encounter (Signed)
Gave avs and calendar ° °

## 2018-06-06 ENCOUNTER — Ambulatory Visit: Payer: Medicare Other | Admitting: Internal Medicine

## 2018-06-07 ENCOUNTER — Other Ambulatory Visit: Payer: Self-pay | Admitting: Hematology

## 2018-06-07 MED ORDER — LORAZEPAM 1 MG PO TABS: 0.5000 mg | ORAL_TABLET | Freq: Three times a day (TID) | ORAL | 0 refills | Status: DC | PRN

## 2018-06-09 ENCOUNTER — Telehealth: Payer: Self-pay | Admitting: Hematology

## 2018-06-09 ENCOUNTER — Other Ambulatory Visit: Payer: Self-pay | Admitting: Internal Medicine

## 2018-06-09 DIAGNOSIS — C7931 Secondary malignant neoplasm of brain: Secondary | ICD-10-CM

## 2018-06-09 NOTE — Telephone Encounter (Signed)
Called pt to inform of 3/30 chemo treatment. Spoke with patient. Confirmed date and time

## 2018-06-12 ENCOUNTER — Inpatient Hospital Stay: Payer: Medicare Other

## 2018-06-12 ENCOUNTER — Telehealth: Payer: Self-pay | Admitting: Hematology

## 2018-06-12 ENCOUNTER — Inpatient Hospital Stay (HOSPITAL_BASED_OUTPATIENT_CLINIC_OR_DEPARTMENT_OTHER): Payer: Medicare Other | Admitting: Hematology

## 2018-06-12 ENCOUNTER — Other Ambulatory Visit: Payer: Self-pay

## 2018-06-12 ENCOUNTER — Encounter: Payer: Self-pay | Admitting: Hematology

## 2018-06-12 VITALS — BP 133/78 | HR 84 | Temp 97.9°F | Resp 18 | Ht 60.0 in | Wt 189.3 lb

## 2018-06-12 DIAGNOSIS — C159 Malignant neoplasm of esophagus, unspecified: Secondary | ICD-10-CM

## 2018-06-12 DIAGNOSIS — Z170 Estrogen receptor positive status [ER+]: Secondary | ICD-10-CM

## 2018-06-12 DIAGNOSIS — Z853 Personal history of malignant neoplasm of breast: Secondary | ICD-10-CM

## 2018-06-12 DIAGNOSIS — Z5111 Encounter for antineoplastic chemotherapy: Secondary | ICD-10-CM | POA: Diagnosis not present

## 2018-06-12 DIAGNOSIS — Z7189 Other specified counseling: Secondary | ICD-10-CM | POA: Diagnosis not present

## 2018-06-12 DIAGNOSIS — R1310 Dysphagia, unspecified: Secondary | ICD-10-CM

## 2018-06-12 DIAGNOSIS — C7931 Secondary malignant neoplasm of brain: Secondary | ICD-10-CM | POA: Diagnosis not present

## 2018-06-12 DIAGNOSIS — G4700 Insomnia, unspecified: Secondary | ICD-10-CM | POA: Diagnosis not present

## 2018-06-12 DIAGNOSIS — I82432 Acute embolism and thrombosis of left popliteal vein: Secondary | ICD-10-CM

## 2018-06-12 DIAGNOSIS — C50111 Malignant neoplasm of central portion of right female breast: Secondary | ICD-10-CM

## 2018-06-12 DIAGNOSIS — J449 Chronic obstructive pulmonary disease, unspecified: Secondary | ICD-10-CM

## 2018-06-12 DIAGNOSIS — R110 Nausea: Secondary | ICD-10-CM

## 2018-06-12 DIAGNOSIS — C50911 Malignant neoplasm of unspecified site of right female breast: Secondary | ICD-10-CM

## 2018-06-12 DIAGNOSIS — I824Z2 Acute embolism and thrombosis of unspecified deep veins of left distal lower extremity: Secondary | ICD-10-CM

## 2018-06-12 LAB — CMP (CANCER CENTER ONLY)
ALT: 22 U/L (ref 0–44)
AST: 15 U/L (ref 15–41)
Albumin: 3.1 g/dL — ABNORMAL LOW (ref 3.5–5.0)
Alkaline Phosphatase: 101 U/L (ref 38–126)
Anion gap: 10 (ref 5–15)
BUN: 12 mg/dL (ref 8–23)
CO2: 23 mmol/L (ref 22–32)
CREATININE: 0.83 mg/dL (ref 0.44–1.00)
Calcium: 8.9 mg/dL (ref 8.9–10.3)
Chloride: 104 mmol/L (ref 98–111)
GFR, Est AFR Am: >60 mL/min (ref >60)
GFR, Estimated: >60 mL/min (ref >60)
Glucose, Bld: 114 mg/dL — ABNORMAL HIGH (ref 70–99)
Potassium: 3.5 mmol/L (ref 3.5–5.1)
Sodium: 137 mmol/L (ref 135–145)
TOTAL PROTEIN: 6.3 g/dL — AB (ref 6.5–8.1)
Total Bilirubin: 0.3 mg/dL (ref 0.3–1.2)

## 2018-06-12 LAB — CBC WITH DIFFERENTIAL (CANCER CENTER ONLY)
Abs Immature Granulocytes: 0.15 10*3/uL — ABNORMAL HIGH (ref 0.00–0.07)
Basophils Absolute: 0 10*3/uL (ref 0.0–0.1)
Basophils Relative: 0 %
Eosinophils Absolute: 0 10*3/uL (ref 0.0–0.5)
Eosinophils Relative: 0 %
HCT: 38.6 % (ref 36.0–46.0)
Hemoglobin: 12.6 g/dL (ref 12.0–15.0)
Immature Granulocytes: 1 %
Lymphocytes Relative: 2 %
Lymphs Abs: 0.2 10*3/uL — ABNORMAL LOW (ref 0.7–4.0)
MCH: 33.2 pg (ref 26.0–34.0)
MCHC: 32.6 g/dL (ref 30.0–36.0)
MCV: 101.8 fL — AB (ref 80.0–100.0)
Monocytes Absolute: 0.5 10*3/uL (ref 0.1–1.0)
Monocytes Relative: 4 %
Neutro Abs: 11 10*3/uL — ABNORMAL HIGH (ref 1.7–7.7)
Neutrophils Relative %: 93 %
Platelet Count: 215 10*3/uL (ref 150–400)
RBC: 3.79 MIL/uL — ABNORMAL LOW (ref 3.87–5.11)
RDW: 14.9 % (ref 11.5–15.5)
WBC Count: 12 10*3/uL — ABNORMAL HIGH (ref 4.0–10.5)
nRBC: 0.3 % — ABNORMAL HIGH (ref 0.0–0.2)

## 2018-06-12 LAB — PROTIME-INR
INR: 2 — ABNORMAL HIGH (ref 0.8–1.2)
Prothrombin Time: 22.1 seconds — ABNORMAL HIGH (ref 11.4–15.2)

## 2018-06-12 MED ORDER — PALONOSETRON HCL INJECTION 0.25 MG/5ML
0.2500 mg | Freq: Once | INTRAVENOUS | Status: AC
  Administered 2018-06-12: 0.25 mg via INTRAVENOUS

## 2018-06-12 MED ORDER — LEUCOVORIN CALCIUM INJECTION 350 MG
400.0000 mg/m2 | Freq: Once | INTRAVENOUS | Status: AC
  Administered 2018-06-12: 764 mg via INTRAVENOUS
  Filled 2018-06-12: qty 17.5

## 2018-06-12 MED ORDER — OXALIPLATIN CHEMO INJECTION 100 MG/20ML
60.0000 mg/m2 | Freq: Once | INTRAVENOUS | Status: AC
  Administered 2018-06-12: 115 mg via INTRAVENOUS
  Filled 2018-06-12: qty 20

## 2018-06-12 MED ORDER — DEXAMETHASONE SODIUM PHOSPHATE 10 MG/ML IJ SOLN
10.0000 mg | Freq: Once | INTRAMUSCULAR | Status: AC
  Administered 2018-06-12: 10 mg via INTRAVENOUS

## 2018-06-12 MED ORDER — DEXTROSE 5 % IV SOLN
Freq: Once | INTRAVENOUS | Status: AC
  Administered 2018-06-12: 15:00:00 via INTRAVENOUS
  Filled 2018-06-12: qty 250

## 2018-06-12 MED ORDER — SODIUM CHLORIDE 0.9 % IV SOLN
2400.0000 mg/m2 | INTRAVENOUS | Status: DC
  Administered 2018-06-12: 4600 mg via INTRAVENOUS
  Filled 2018-06-12: qty 92

## 2018-06-12 MED ORDER — SODIUM CHLORIDE 0.9 % IV SOLN: 10.0000 mg | Freq: Once | INTRAVENOUS | Status: DC

## 2018-06-12 MED ORDER — TRAZODONE HCL 50 MG PO TABS: 50.0000 mg | ORAL_TABLET | Freq: Every day | ORAL | 1 refills | Status: DC

## 2018-06-12 NOTE — Patient Instructions (Signed)
[facility] Discharge Instructions for Patients Receiving Chemotherapy  Today you received the following chemotherapy agents Oxaliplatin, Leucovorin, 5-FU. To help prevent nausea and vomiting after your treatment, we encourage you to take your nausea medication. DO NOT TAKE ZOFRAN FOR THREE DAYS AFTER TREATMENT.   If you develop nausea and vomiting that is not controlled by your nausea medication, call the clinic.   BELOW ARE SYMPTOMS THAT SHOULD BE REPORTED IMMEDIATELY:  *FEVER GREATER THAN 100.5 F  *CHILLS WITH OR WITHOUT FEVER  NAUSEA AND VOMITING THAT IS NOT CONTROLLED WITH YOUR NAUSEA MEDICATION  *UNUSUAL SHORTNESS OF BREATH  *UNUSUAL BRUISING OR BLEEDING  TENDERNESS IN MOUTH AND THROAT WITH OR WITHOUT PRESENCE OF ULCERS  *URINARY PROBLEMS  *BOWEL PROBLEMS  UNUSUAL RASH Items with * indicate a potential emergency and should be followed up as soon as possible.  Feel free to call the clinic should you have any questions or concerns. The clinic phone number is [phone].  Please show the CHEMO ALERT CARD at check-in to the Emergency Department and triage nurse.  Oxaliplatin Injection What is this medicine? OXALIPLATIN (ox AL i PLA tin) is a chemotherapy drug. It targets fast dividing cells, like cancer cells, and causes these cells to die. This medicine is used to treat cancers of the colon and rectum, and many other cancers. This medicine may be used for other purposes; ask your health care provider or pharmacist if you have questions. COMMON BRAND NAME(S): Eloxatin What should I tell my health care provider before I take this medicine? They need to know if you have any of these conditions: -kidney disease -an unusual or allergic reaction to oxaliplatin, other chemotherapy, other medicines, foods, dyes, or preservatives -pregnant or trying to get pregnant -breast-feeding How should I use this medicine? This drug is given as an infusion into a vein.  It is administered in a hospital or clinic by a specially trained health care professional. Talk to your pediatrician regarding the use of this medicine in children. Special care may be needed. Overdosage: If you think you have taken too much of this medicine contact a poison control center or emergency room at once. NOTE: This medicine is only for you. Do not share this medicine with others. What if I miss a dose? It is important not to miss a dose. Call your doctor or health care professional if you are unable to keep an appointment. What may interact with this medicine? -medicines to increase blood counts like filgrastim, pegfilgrastim, sargramostim -probenecid -some antibiotics like amikacin, gentamicin, neomycin, polymyxin B, streptomycin, tobramycin -zalcitabine Talk to your doctor or health care professional before taking any of these medicines: -acetaminophen -aspirin -ibuprofen -ketoprofen -naproxen This list may not describe all possible interactions. Give your health care provider a list of all the medicines, herbs, non-prescription drugs, or dietary supplements you use. Also tell them if you smoke, drink alcohol, or use illegal drugs. Some items may interact with your medicine. What should I watch for while using this medicine? Your condition will be monitored carefully while you are receiving this medicine. You will need important blood work done while you are taking this medicine. This medicine can make you more sensitive to cold. Do not drink cold drinks or use ice. Cover exposed skin before coming in contact with cold temperatures or cold objects. When out in cold weather wear warm clothing and cover your mouth and nose to warm the air that goes into your lungs. Tell your doctor if you   get sensitive to the cold. This drug may make you feel generally unwell. This is not uncommon, as chemotherapy can affect healthy cells as well as cancer cells. Report any side effects. Continue  your course of treatment even though you feel ill unless your doctor tells you to stop. In some cases, you may be given additional medicines to help with side effects. Follow all directions for their use. Call your doctor or health care professional for advice if you get a fever, chills or sore throat, or other symptoms of a cold or flu. Do not treat yourself. This drug decreases your body's ability to fight infections. Try to avoid being around people who are sick. This medicine may increase your risk to bruise or bleed. Call your doctor or health care professional if you notice any unusual bleeding. Be careful brushing and flossing your teeth or using a toothpick because you may get an infection or bleed more easily. If you have any dental work done, tell your dentist you are receiving this medicine. Avoid taking products that contain aspirin, acetaminophen, ibuprofen, naproxen, or ketoprofen unless instructed by your doctor. These medicines may hide a fever. Do not become pregnant while taking this medicine. Women should inform their doctor if they wish to become pregnant or think they might be pregnant. There is a potential for serious side effects to an unborn child. Talk to your health care professional or pharmacist for more information. Do not breast-feed an infant while taking this medicine. Call your doctor or health care professional if you get diarrhea. Do not treat yourself. What side effects may I notice from receiving this medicine? Side effects that you should report to your doctor or health care professional as soon as possible: -allergic reactions like skin rash, itching or hives, swelling of the face, lips, or tongue -low blood counts - This drug may decrease the number of white blood cells, red blood cells and platelets. You may be at increased risk for infections and bleeding. -signs of infection - fever or chills, cough, sore throat, pain or difficulty passing urine -signs of  decreased platelets or bleeding - bruising, pinpoint red spots on the skin, black, tarry stools, nosebleeds -signs of decreased red blood cells - unusually weak or tired, fainting spells, lightheadedness -breathing problems -chest pain, pressure -cough -diarrhea -jaw tightness -mouth sores -nausea and vomiting -pain, swelling, redness or irritation at the injection site -pain, tingling, numbness in the hands or feet -problems with balance, talking, walking -redness, blistering, peeling or loosening of the skin, including inside the mouth -trouble passing urine or change in the amount of urine Side effects that usually do not require medical attention (report to your doctor or health care professional if they continue or are bothersome): -changes in vision -constipation -hair loss -loss of appetite -metallic taste in the mouth or changes in taste -stomach pain This list may not describe all possible side effects. Call your doctor for medical advice about side effects. You may report side effects to FDA at 1-800-FDA-1088. Where should I keep my medicine? This drug is given in a hospital or clinic and will not be stored at home. NOTE: This sheet is a summary. It may not cover all possible information. If you have questions about this medicine, talk to your doctor, pharmacist, or health care provider.  2019 [employer] Standard (2007-09-26 17:22:47) Leucovorin injection What is this medicine? LEUCOVORIN (loo koe VOR in) is used to prevent or treat the harmful effects of some medicines. This medicine   is used to treat anemia caused by a low amount of folic acid in the body. It is also used with 5-fluorouracil (5-FU) to treat colon cancer. This medicine may be used for other purposes; ask your health care provider or pharmacist if you have questions. What should I tell my health care provider before I take this medicine? They need to know if you have any of these conditions: -anemia from  low levels of vitamin B-12 in the blood -an unusual or allergic reaction to leucovorin, folic acid, other medicines, foods, dyes, or preservatives -pregnant or trying to get pregnant -breast-feeding How should I use this medicine? This medicine is for injection into a muscle or into a vein. It is given by a health care professional in a hospital or clinic setting. Talk to your pediatrician regarding the use of this medicine in children. Special care may be needed. Overdosage: If you think you have taken too much of this medicine contact a poison control center or emergency room at once. NOTE: This medicine is only for you. Do not share this medicine with others. What if I miss a dose? This does not apply. What may interact with this medicine? -capecitabine -fluorouracil -phenobarbital -phenytoin -primidone -trimethoprim-sulfamethoxazole This list may not describe all possible interactions. Give your health care provider a list of all the medicines, herbs, non-prescription drugs, or dietary supplements you use. Also tell them if you smoke, drink alcohol, or use illegal drugs. Some items may interact with your medicine. What should I watch for while using this medicine? Your condition will be monitored carefully while you are receiving this medicine. This medicine may increase the side effects of 5-fluorouracil, 5-FU. Tell your doctor or health care professional if you have diarrhea or mouth sores that do not get better or that get worse. What side effects may I notice from receiving this medicine? Side effects that you should report to your doctor or health care professional as soon as possible: -allergic reactions like skin rash, itching or hives, swelling of the face, lips, or tongue -breathing problems -fever, infection -mouth sores -unusual bleeding or bruising -unusually weak or tired Side effects that usually do not require medical attention (report to your doctor or health care  professional if they continue or are bothersome): -constipation or diarrhea -loss of appetite -nausea, vomiting This list may not describe all possible side effects. Call your doctor for medical advice about side effects. You may report side effects to FDA at 1-800-FDA-1088. Where should I keep my medicine? This drug is given in a hospital or clinic and will not be stored at home. NOTE: This sheet is a summary. It may not cover all possible information. If you have questions about this medicine, talk to your doctor, pharmacist, or health care provider.  2019 Elsevier/Gold Standard (2007-09-05 16:50:29) Ferumoxytol injection What is this medicine? FERUMOXYTOL is an iron complex. Iron is used to make healthy red blood cells, which carry oxygen and nutrients throughout the body. This medicine is used to treat iron deficiency anemia. This medicine may be used for other purposes; ask your health care provider or pharmacist if you have questions. COMMON BRAND NAME(S): Feraheme What should I tell my health care provider before I take this medicine? They need to know if you have any of these conditions: -anemia not caused by low iron levels -high levels of iron in the blood -magnetic resonance imaging (MRI) test scheduled -an unusual or allergic reaction to iron, other medicines, foods, dyes, or preservatives -pregnant   or trying to get pregnant -breast-feeding How should I use this medicine? This medicine is for injection into a vein. It is given by a health care professional in a hospital or clinic setting. Talk to your pediatrician regarding the use of this medicine in children. Special care may be needed. Overdosage: If you think you have taken too much of this medicine contact a poison control center or emergency room at once. NOTE: This medicine is only for you. Do not share this medicine with others. What if I miss a dose? It is important not to miss your dose. Call your doctor or health  care professional if you are unable to keep an appointment. What may interact with this medicine? This medicine may interact with the following medications: -other iron products This list may not describe all possible interactions. Give your health care provider a list of all the medicines, herbs, non-prescription drugs, or dietary supplements you use. Also tell them if you smoke, drink alcohol, or use illegal drugs. Some items may interact with your medicine. What should I watch for while using this medicine? Visit your doctor or healthcare professional regularly. Tell your doctor or healthcare professional if your symptoms do not start to get better or if they get worse. You may need blood work done while you are taking this medicine. You may need to follow a special diet. Talk to your doctor. Foods that contain iron include: whole grains/cereals, dried fruits, beans, or peas, leafy green vegetables, and organ meats (liver, kidney). What side effects may I notice from receiving this medicine? Side effects that you should report to your doctor or health care professional as soon as possible: -allergic reactions like skin rash, itching or hives, swelling of the face, lips, or tongue -breathing problems -changes in blood pressure -feeling faint or lightheaded, falls -fever or chills -flushing, sweating, or hot feelings -swelling of the ankles or feet Side effects that usually do not require medical attention (report to your doctor or health care professional if they continue or are bothersome): -diarrhea -headache -nausea, vomiting -stomach pain This list may not describe all possible side effects. Call your doctor for medical advice about side effects. You may report side effects to FDA at 1-800-FDA-1088. Where should I keep my medicine? This drug is given in a hospital or clinic and will not be stored at home. NOTE: This sheet is a summary. It may not cover all possible information. If you  have questions about this medicine, talk to your doctor, pharmacist, or health care provider.  2019 [employer]/Gold Standard (2016-04-19 20:21:10)  

## 2018-06-12 NOTE — Telephone Encounter (Signed)
Scheduled appt per 3/30 los.   

## 2018-06-12 NOTE — Progress Notes (Signed)
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) Staging form: Breast, AJCC 8th Edition - Clinical stage from 01/10/2018: Stage IA (cT1b, cN0, cM0, G2, ER+, PR+, HER2-) - Signed by [name], MD on 02/03/2018       Metastasis to brain [facility])   01/27/2018 Imaging    MRI Brain W WO Contrast 01/27/18  IMPRESSION: 2.9 cm hemorrhagic cerebellar mass most consistent with solitary brain metastasis. 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    01/30/2018 Initial Diagnosis    Metastasis to brain [facility])    01/31/2018 Imaging    MRI Brain W WO Contrast 01/31/18  IMPRESSION: 3 Tesla study does not disclose any additional lesions. Solitary metastasis in the right superior cerebellum measuring 2.9 x 2.6 x 3.0 cm with mild surrounding edema. 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    02/03/2018 - 02/08/2018 Radiation Therapy    SBRT with Dr. Isidore Moos on 02/03/18, 02/06/18, 02/08/18     Esophageal cancer, stage IV ([place])   01/08/2018 Imaging    CT AP W Constrast 01/08/18  IMPRESSION: 1. Nonobstructed, nondistended bowel. No acute inflammatory process. 2. Moderate-sized hiatal hernia noted. 3. Low-density 1.7 cm left adrenal nodule likely to represent a small adenoma. Lower pole left renal cyst measuring 1.8 cm. 4. Moderate aortic atherosclerosis. 5. Lumbar spondylosis and facet arthropathy.    01/27/2018 Imaging    CT Chest W Contrast 01/27/18  IMPRESSION: 1. Large mass involving the distal half of the esophagus spanning approximately 11.7 cm is identified compatible with primary esophageal  neoplasm. 2. Small posterior mediastinal and high right paratracheal lymph nodes are noted which may represent foci of metastatic adenopathy. 3. The small pulmonary nodules in the right lung are unchanged from 01/15/2016 and are favored to represent a benign process. 4. Aortic Atherosclerosis (ICD10-I70.0) and Emphysema (ICD10-J43.9). 5. Coronary artery atherosclerotic calcifications.    01/30/2018 Initial Diagnosis    Esophageal cancer, stage IV ([place])    02/03/2018 - 02/08/2018 Radiation Therapy    SBRT to oligo brain metastasis, in 3 fractions, under Dr. Isidore Moos     02/16/2018 PET scan    PET 02/16/18 IMPRESSION: 1. A 15.7 cm in length esophageal mass extends from the aortic arch level down to the distal esophagus, maximum SUV 22.4, compatible with malignancy. There is a small right lower neck level IV lymph node with lower than mediastinal blood pool activity, and a right upper paratracheal lymph node with just above mediastinal blood pool activity. 2. Faint speckled heterogeneity in the liver, for example in the lateral segment left hepatic lobe, is likely incidental/benign given the lack of correlate of CT finding. It might be prudent to obtain an MRI just to make sure that there is not a small early metastatic lesion in this vicinity. 3. Known right cerebellar vermian metastatic lesion, with expected hypo activity compared to the surrounding brain activity. 4. The right middle lobe pulmonary nodule measures 1.1 by 1.0 cm and is mildly hypermetabolic with maximum SUV 2.6. This lesion measured 6 by 5 mm back on 12/06/2003 and 1.2 by 1.1 cm on 10/15/2015. I am uncertain what to make of the very slow growth and mildly  accentuated metabolic activity. This could be a chronic granulomatous process. Low-grade adenocarcinoma of the lung seems unlikely to be so indolent as to only mildly increase over a 14 year period, but is presumably a differential diagnostic consideration.  Surveillance is suggested. 5. Other imaging findings of potential clinical significance: Aortic Atherosclerosis (ICD10-I70.0). Descending and sigmoid colon diverticulosis. Gas in the urinary bladder, most common cause would be recent catheterization. Minimal chronic left ethmoid sinusitis.    02/27/2018 - 04/03/2018 Chemotherapy    Concurrent chemoRT with weekly carboplatin and Taxol 02/27/2018-04/03/18    02/27/2018 - 04/07/2018 Radiation Therapy    Concurrent ChemoRT 02/27/18-04/07/18     Chemotherapy    Pending FOLFOX q2weeks starting next week    06/12/2018 -  Chemotherapy    The patient had palonosetron (ALOXI) injection 0.25 mg, 0.25 mg, Intravenous,  Once, 0 of 6 cycles leucovorin 764 mg in dextrose 5 % 250 mL infusion, 400 mg/m2 = 764 mg, Intravenous,  Once, 0 of 6 cycles oxaliplatin (ELOXATIN) 115 mg in dextrose 5 % 500 mL chemo infusion, 60 mg/m2 = 115 mg (70.6 % of original dose 85 mg/m2), Intravenous,  Once, 0 of 6 cycles Dose modification: 60 mg/m2 (original dose 85 mg/m2, Cycle 1, Reason: Provider Judgment) fluorouracil (ADRUCIL) 4,600 mg in sodium chloride 0.9 % 58 mL chemo infusion, 2,400 mg/m2 = 4,600 mg, Intravenous, 1 Day/Dose, 0 of 6 cycles  for chemotherapy treatment.      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 Ultrasound-guided biopsy is recommended.    01/10/2018 Initial Biopsy    Diagnsis from Breast Biopsy 01/10/18  Breast, right, needle core biopsy, 6 o'clock - INVASIVE DUCTAL CARCINOMA WITH CALCIFICATIONS, SEE COMMENT. - DUCTAL CARCINOMA IN SITU. 1 of 2 FINAL for Kristin, Williams [phone]) Microscopic Comment The carcinoma appears grade 2. Prognostic markers will be ordered. Dr.  [name] has reviewed the case. The case was called to [place] on 01/11/2018.    01/10/2018 Receptors her2    The tumor cells are NEGATIVE for Her2 (1+). Estrogen Receptor: 100%, POSITIVE, STRONG STAINING INTENSITY Progesterone Receptor: 100%, POSITIVE, STRONG STAINING INTENSITY Proliferation Marker Ki67: 15%    01/10/2018 Cancer Staging    Staging form: Breast, AJCC 8th Edition - Clinical stage from 01/10/2018: Stage IA (cT1b, cN0, cM0, G2, ER+, PR+, HER2-) - Signed by [name], MD on 02/03/2018    01/30/2018 Initial Diagnosis    Breast cancer ([place])    05/07/2018 -  Anti-estrogen oral therapy    Anastrozole 97m daily starting 05/07/2018      CURRENT THERAPY:  -Anastrozole 1761mdaily started2/23/2020 -FOLFOX with leucovorin q2weeks starting 06/12/18   INTERVAL HISTORY:  BeHONESTI SEABERGs here for a follow up and cycle 1 FOLFOX. She presents to the clinic today by herself. Her daughter KiMaudie Mercuryas called so she could be included in visit today.  She notes the first bite her food will sit in her esophagus and once she drinks something it will go down and not happen again. She notes smaller bites followed by water helps her swallow better. She notes he speech is still slurred. She is still on dexa 61m12maily. She plans to see Dr. VasMickeal Skinner a week, but is willing to  see him sooner.  She notes her right hand function is still sporadic so it makes it harder to use cane. She is using walker. Her daughter notes she is weaker and the patient notes her left leg is weaker. She only takes a few stairs, not a whole flight. She notes she completed 3 weeks of PT. She is able to move around most of the day. When she sits down she takes a nap. She does not sleep well, she gets a few hours of sleep at a time. She is willing to take a shorter nap (no more than an hour). She notes this was still an issue off steroid. She has tried melatonin and benadryl. She is willing to try trazodone. She  notes ativan has not been helping her sleep either.  She still has nausea, she has been using Reglan and ativan which helps. She also has crystallized ginger. She is not able to tolerate phenergan. She does not like Zofran but is willing to try it.  She notes her cough is stable from COPD    REVIEW OF SYSTEMS:   Constitutional: Denies fevers, chills or abnormal weight loss (+) trouble sleeping, insomnia (+) Tired  Eyes: Denies blurriness of vision Ears, nose, mouth, throat, and face: Denies mucositis or sore throat (+) Mild odynophagia, improved  Respiratory: Denies dyspnea or wheezes (+) Stable cough from COPD  Cardiovascular: Denies palpitation, chest discomfort or lower extremity swelling Gastrointestinal:  Denies heartburn or change in bowel habits (+) Nausea  Skin: Denies abnormal skin rashes Lymphatics: Denies new lymphadenopathy or easy bruising Neurological:Denies numbness, tingling or new weaknesses (+) slurred speech (+) loss of right hand control and weakness in left leg  Behavioral/Psych: Mood is stable, no new changes  All other systems were reviewed with the patient and are negative.  MEDICAL HISTORY:  Past Medical History:  Diagnosis Date  . Arthritis   . Cancer (H[facility])    Cervical  . Cancer ([place])    Breast  . Cancer ([place])    Vaginal  . Complication of anesthesia   . COPD (chronic obstructive pulmonary disease) ([place])   . Headache(784.0)   . History of radiation therapy 02/28/19- 04/07/18   Esophagus, 1.8 Gy in 28 fractions for a total dose of 50.4 Gy.   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   . Hyperlipidemia   . Leg pain   . Peripheral vascular disease ([place])   . Personal history of chemotherapy   . Personal history of radiation therapy   . PONV (postoperative nausea and vomiting)     SURGICAL HISTORY: Past Surgical History:  Procedure Laterality Date  . ABDOMINAL HYSTERECTOMY  1975  . BREAST LUMPECTOMY   2005  . GANGLION CYST EXCISION  1977  . ILIAC ARTERY STENT  04/20/2004   CSD right iliac occlusive disease  . IR IMAGING GUIDED PORT INSERTION  02/22/2018    I have reviewed the social history and family history with the patient and they are unchanged from previous note.  ALLERGIES:  is allergic to flonase [fluticasone propionate].  MEDICATIONS:  Current Outpatient Medications  Medication Sig Dispense Refill  . acetaminophen (TYLENOL) 160 MG/5ML liquid Take 325 mg by mouth every 4 (four) hours as needed for fever.    Marland Kitchen albuterol (PROVENTIL HFA;VENTOLIN HFA) 108 (90 Base) MCG/ACT inhaler Inhale 2 puffs into the lungs every 6 (six) hours as needed for wheezing or shortness of breath.    Marland Kitchen  anastrozole (ARIMIDEX) 1 MG tablet Take 1 tablet (1 mg total) by mouth daily. 30 tablet 2  . dexamethasone (DECADRON) 4 MG tablet Take 31m daily in the AM 30 tablet 2  . furosemide (LASIX) 20 MG tablet Take 1 tablet (20 mg total) by mouth every other day. 15 tablet 0  . lidocaine (XYLOCAINE) 2 % solution MIX 1 PART 2% VISCOUS LIDOCAINE, 1 PART H20 SWALLOW 10ML OF DILUTED MIXTURE, 20 MIN BEFORE MEALS AND AT BEDTIME UP TO 3 TIMES DAILY 300 mL 0  . lidocaine-prilocaine (EMLA) cream Apply 1 application topically as needed. 30 g 1  . LORazepam (ATIVAN) 1 MG tablet Take 0.5 tablets (0.5 mg total) by mouth 3 (three) times daily as needed for anxiety or sleep. 45 tablet 0  . metoCLOPramide (REGLAN) 10 MG tablet TAKE 1 TABLET BY MOUTH EVERY 6 HOURS AS NEEDED FOR NAUSEA 60 tablet 0  . omeprazole (PRILOSEC) 40 MG capsule Take 40 mg by mouth daily.    . ondansetron (ZOFRAN) 8 MG tablet Take 1 tablet (8 mg total) by mouth every 8 (eight) hours as needed for nausea or vomiting. 20 tablet 0  . POTASSIUM CHLORIDE PO Take 20 mEq by mouth daily.     . sucralfate (CARAFATE) 1 g tablet DISSOLVE ONE TABLET IN 10 MLS OF WATER AND SWALLOW UP TO FOUR TIMES DAILY TO SOOTHE THROAT 40 tablet 0  . warfarin (COUMADIN) 5 MG tablet Take  1 tablet (5 mg total) by mouth daily. 30 tablet 3  . traZODone (DESYREL) 50 MG tablet Take 1 tablet (50 mg total) by mouth at bedtime. 30 tablet 1   No current facility-administered medications for this visit.     PHYSICAL EXAMINATION: ECOG PERFORMANCE STATUS: 2 - Symptomatic, <50% confined to bed  Vitals:   06/12/18 1303  BP: 133/78  Pulse: 84  Resp: 18  Temp: 97.9 F (36.6 C)  SpO2: 98%   Filed Weights   06/12/18 1303  Weight: 189 lb 4.8 oz (85.9 kg)    GENERAL:alert, no distress and comfortable SKIN: skin color, texture, turgor are normal, no rashes or significant lesions EYES: normal, Conjunctiva are pink and non-injected, sclera clear OROPHARYNX:no exudate, no erythema and lips, buccal mucosa, and tongue normal  NECK: supple, thyroid normal size, non-tender, without nodularity LYMPH:  no palpable lymphadenopathy in the cervical, axillary or inguinal LUNGS: clear to auscultation and percussion with normal breathing effort, occasional wheezing on bilateral lungs HEART: regular rate & rhythm and no murmurs and no lower extremity edema ABDOMEN:abdomen soft, non-tender and normal bowel sounds Musculoskeletal:no cyanosis of digits and no clubbing  NEURO: alert & oriented x 3 with fluent speech, no focal motor/sensory deficits  LABORATORY DATA:  I have reviewed the data as listed CBC Latest Ref Rng & Units 06/12/2018 06/05/2018 05/30/2018  WBC 4.0 - 10.5 K/uL 12.0(H) 6.5 6.3  Hemoglobin 12.0 - 15.0 g/dL 12.6 12.1 11.6(L)  Hematocrit 36.0 - 46.0 % 38.6 37.3 37.8  Platelets 150 - 400 K/uL 215 182 151     CMP Latest Ref Rng & Units 06/12/2018 06/05/2018 05/30/2018  Glucose 70 - 99 mg/dL 114(H) 85 99  BUN 8 - 23 mg/dL '12 14 10  ' Creatinine 0.44 - 1.00 mg/dL 0.83 0.74 0.64  Sodium 135 - 145 mmol/L 137 136 138  Potassium 3.5 - 5.1 mmol/L 3.5 4.0 3.6  Chloride 98 - 111 mmol/L 104 104 103  CO2 22 - 32 mmol/L '23 23 27  ' Calcium 8.9 - 10.3 mg/dL 8.9 9.1  9.2  Total Protein 6.5 - 8.1  g/dL 6.3(L) 6.0(L) -  Total Bilirubin 0.3 - 1.2 mg/dL 0.3 0.3 -  Alkaline Phos 38 - 126 U/L 101 94 -  AST 15 - 41 U/L 15 16 -  ALT 0 - 44 U/L 22 22 -      RADIOGRAPHIC STUDIES: I have personally reviewed the radiological images as listed and agreed with the findings in the report. No results found.   ASSESSMENT & PLAN:  BRENNAN KARAM is a 73 y.o. female with   1.EsophagealAdenocarcinoma,with oligo met incerebellum, stage IV , HER2(-), MSS, and PD-L1(-) -She was diagnosed with esophogeal cancerin 01/2018,with oligometastasisto the brain. Given her stage IV disease her cancer is not curable but still treatable. Goal of care it to control her disease.  -She completedSBRTto her brain meton11/27/2019and completed chemoRTto primary tumoron 04/07/18.She recovered well.  -Her molecular testing on the tumor showed MSI stable disease, PD-L1 negative, she is not a candidate for immunotherapy.  Her2 was also negative, not a candidate for EGFR inhibitor. -She is clinically stable with persistent slurred speech, gait change, secondary to her brain mets, and mild odynophagia improving secondary to her primary tumor.   -Today I will start her on consolidation chemotherapy treatment with FOLFOX q2weeks for 3 months to better control her disease. I again reviewed side effects with patient and her daughter.  -Physical exam stable and unremarkable today -Labs reviewed, CBC and CMP WNL except WBC 12, ANC 11, BG 114, protein 6.3, albumin 3.1. Overall adequate to proceed with first cycle FOLFOX with dose reduction of oxaliplatin, due to advanced age and limited performance status.  -I answered all her and her daughter's questions to their understanding and satisfaction.  -f/u in 2 week before cycle 2, patient knows to call us if she has significant side effect of chemotherapy  2. Dysphagia andodynophagia, nausea  -secondary toher esophageal cancer and chemoradiation. -she is recovering  well overall.She has not needed pain medicationlately -Continue Lidocaine and Sulcrafate with eating. I encouraged her to not take Lidocaine more than 3 times a day to prevent over exposure to heart.  -HerOdynophagiais only present with her initial start to eating and taking certain pills and has improved with smaller bites followed by water. Weight remains stable. -She continues to have nausea. She will continue Reglan and ativan. May worsen with FOLFOX. She will use Zofran if needed  -I strongly encouraged her to maintain appetite and weight.   3. RightBreastInvasive Ductal Carcinoma,cT1bN0M0 stage IA,Grade II, ER/PR Positive, HER 2 negative -Diagnosed through screening mammogram, same time as her esophageal cancer in October 2019.  -Given her early stage breast cancer, I previously discussedher breast cancer treatment is less urgent than esophogeal cancertreatment. -Given her EP/PR breast cancer and post-menopausal status, she started antiestrogen therapy with anastrozole on 05/07/18, and tolerating well except recent tiny bruising of her breasts and abdomen. Will monitor.  -Her breasts surgery has been held due to her metastatic esophageal cancer. -She has developed mild skin rash on her chest and abdomen after she started anastrozole, not symptomatic, will continue monitoring.   4.Oligo brain mets, Insomnia  -This is likely from her primaryesophogealcancer.  -She does have balance issues and ambulates with assistance.  -She is s/p SBRT. Hermild slurred speech, right sided weaknessand balance stabilityhas muchimproved -Will monitor with brain scan every 3 months. -She had a brain MRI on 04/28/2018 and shows decreases in brain met but has considerable increase in edema of cerebellum -Dr. Mickeal Skinner previously increased  her dexa to 52m daily. But her slurred speech, loss of control of right upper extremity and weakness of legs persists. It is harder for her to use cane, she  mostly ambulates with walker.  -I will contact Dr. VMickeal Skinnerto see her sooner.  -Dexa exacerbated her insomnia even with ativan to 1.5 tabs. I called in Trazodone 5351mnightly as needed (06/12/18)  5. H/o Left breast cancer, ER/PR negative, treated by Dr. MaJana Hakimith lumpectomy, adj, chemo and radiation. -She was previously referred to genetics  -no evidence of recurrence   6.Acute deep vein thrombosis (DVT) of popliteal vein of left lower extremity, diagnosed on 02/28/2018  -She was found to have left lower extremity edema on his first day of chemoradiation, Doppler showed DVT -She is currently on coumadin 51m41maily,INR 2.0 today (06/12/18), therapeutic, will continue Coumadin at the same dose.    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  -Stable  8.Goal of care discussion  -she is full code now -Patient understand her treatment is palliative to prolong her life,her cancer is not curable unfortunately.  9.COPD -Shehas chronic cough -She presented to ED for COPD excerebration on 04/12/18. She has recovered and her breathing is improved. -stable    PLAN: -I prescribed her Trazodone today to take once nightly as needed for insomnia  -Labs reviewed and adequate to proceed with FOLFOX and Leucovorin today at reduced dose of oxaliplatin  -Lab, flush, f/u and treatment in 2 weeks    No problem-specific Assessment & Plan notes found for this encounter.   No orders of the defined types were placed in this encounter.  All questions were answered. The patient knows to call the clinic with any problems, questions or concerns. No barriers to learning was detected. I spent 20 minutes counseling the patient face to face. The total time spent in the appointment was 25 minutes and more than 50% was on counseling and review of test results     YanTruitt MerleMD 06/12/2018   I, AmoJoslyn Devonm acting as scribe for YanTruitt MerleD.   I have reviewed the above documentation for accuracy and completeness, and I agree with the above.

## 2018-06-13 ENCOUNTER — Other Ambulatory Visit: Payer: Self-pay | Admitting: *Deleted

## 2018-06-13 DIAGNOSIS — C7931 Secondary malignant neoplasm of brain: Secondary | ICD-10-CM

## 2018-06-14 ENCOUNTER — Other Ambulatory Visit: Payer: Self-pay

## 2018-06-14 ENCOUNTER — Telehealth: Payer: Self-pay

## 2018-06-14 ENCOUNTER — Inpatient Hospital Stay: Payer: Medicare Other | Attending: Hematology

## 2018-06-14 VITALS — BP 120/52 | HR 85 | Temp 98.4°F | Resp 17

## 2018-06-14 DIAGNOSIS — I82432 Acute embolism and thrombosis of left popliteal vein: Secondary | ICD-10-CM | POA: Insufficient documentation

## 2018-06-14 DIAGNOSIS — I824Z2 Acute embolism and thrombosis of unspecified deep veins of left distal lower extremity: Secondary | ICD-10-CM

## 2018-06-14 DIAGNOSIS — C7931 Secondary malignant neoplasm of brain: Secondary | ICD-10-CM | POA: Insufficient documentation

## 2018-06-14 DIAGNOSIS — C159 Malignant neoplasm of esophagus, unspecified: Secondary | ICD-10-CM | POA: Insufficient documentation

## 2018-06-14 DIAGNOSIS — C50911 Malignant neoplasm of unspecified site of right female breast: Secondary | ICD-10-CM | POA: Insufficient documentation

## 2018-06-14 DIAGNOSIS — Z5111 Encounter for antineoplastic chemotherapy: Secondary | ICD-10-CM | POA: Diagnosis not present

## 2018-06-14 DIAGNOSIS — Z95828 Presence of other vascular implants and grafts: Secondary | ICD-10-CM

## 2018-06-14 DIAGNOSIS — Z452 Encounter for adjustment and management of vascular access device: Secondary | ICD-10-CM | POA: Insufficient documentation

## 2018-06-14 MED ORDER — HEPARIN SOD (PORK) LOCK FLUSH 100 UNIT/ML IV SOLN
500.0000 [IU] | Freq: Once | INTRAVENOUS | Status: AC | PRN
  Administered 2018-06-14: 16:00:00 500 [IU]
  Filled 2018-06-14: qty 5

## 2018-06-14 MED ORDER — SODIUM CHLORIDE 0.9% FLUSH
10.0000 mL | Freq: Once | INTRAVENOUS | Status: AC | PRN
  Administered 2018-06-14: 10 mL
  Filled 2018-06-14: qty 10

## 2018-06-14 NOTE — Telephone Encounter (Signed)
Spoke with patient's daughter, per Dr. Burr Medico instructed her to take Tylenol every 6 hours for one to two days, use liquid Lidocaine, observe for fever and call back if develops, she verbalized an understanding.

## 2018-06-14 NOTE — Telephone Encounter (Signed)
Patient's daughter calls stating patient has sore throat which started yesterday, denies fever, wants to know what she can take for this?  Her discharge paperwork said to call before taking Tylenol or Ibuprofen.   [phone]

## 2018-06-16 ENCOUNTER — Other Ambulatory Visit: Payer: Self-pay | Admitting: Radiation Oncology

## 2018-06-16 ENCOUNTER — Other Ambulatory Visit: Payer: Self-pay

## 2018-06-16 DIAGNOSIS — C154 Malignant neoplasm of middle third of esophagus: Secondary | ICD-10-CM

## 2018-06-16 MED ORDER — SUCRALFATE 1 G PO TABS: ORAL_TABLET | ORAL | 0 refills | Status: DC

## 2018-06-18 ENCOUNTER — Other Ambulatory Visit: Payer: Self-pay | Admitting: Hematology

## 2018-06-18 DIAGNOSIS — C159 Malignant neoplasm of esophagus, unspecified: Secondary | ICD-10-CM

## 2018-06-19 ENCOUNTER — Inpatient Hospital Stay (HOSPITAL_BASED_OUTPATIENT_CLINIC_OR_DEPARTMENT_OTHER): Payer: Medicare Other | Admitting: Internal Medicine

## 2018-06-19 DIAGNOSIS — R471 Dysarthria and anarthria: Secondary | ICD-10-CM

## 2018-06-19 NOTE — Progress Notes (Signed)
I connected with Kristin Williams on 06/19/18 at 10:00 AM EDT by telephone and verified that I am speaking with the correct person using two identifiers.    I discussed the limitations, risks, security and privacy concerns of performing an evaluation and management service by telephone and the avilability of in person appointments.  I also discussed with the patient that there may be a patient responsible charge related to this service.  The patient expressed understanding and agreed to proceed.  History of Present Ilness: Describes modest decline in status, having difficulty coordinating her right hand/arm, and feels speech is a little more slurred.  That said, she is able to use utensils with both hands, tie her shoes, button her shirt.  She is able to walk with a cane for short distances around the house, and relies on walker for longer ventures.  Despite changes, she does feel like the steroid has helped her and does not want to decrease it.  No falls, no seizures. Observations: Dysarthria noted, stable from prior.  Mental status normal. Assessment and Plan: Continue dexamethasone 4mg  daily. She will call us if changes progress and functional impairment occurs.  We could consider moving up the brain MRI.  Otherwise, will plan to follow up with her in person after MRI brain at the end of May. Follow Up Instructions: RTC after next MRI or sooner as needed  I discussed the assessment and treatment plan with the patient.  The patient was provided an opportunity to ask questions and all were answered.  The patient agreed with the plan and demonstrated understanding of the instructions.    The patient was advised to call back or seek an in-person evaluation if the symptoms worsen or if the condition fails to improve as anticipated.  I provided 5-10 minutes of non-face-to-face time during this enocunter.  Ventura Sellers, MD

## 2018-06-20 ENCOUNTER — Telehealth: Payer: Self-pay | Admitting: Internal Medicine

## 2018-06-20 NOTE — Telephone Encounter (Signed)
No los per 4/6. °

## 2018-06-22 ENCOUNTER — Encounter: Payer: Self-pay | Admitting: Pharmacist

## 2018-06-23 NOTE — Progress Notes (Signed)
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    01/30/2018 Initial Diagnosis    Metastasis to brain [facility])    01/31/2018 Imaging    MRI Brain W WO Contrast 01/31/18  IMPRESSION: 3 Tesla study does not disclose any additional lesions. Solitary metastasis in the right superior cerebellum measuring 2.9 x 2.6 x 3.0 cm with mild surrounding edema. 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    02/03/2018 - 02/08/2018 Radiation Therapy    SBRT with Dr. Isidore Moos on 02/03/18, 02/06/18, 02/08/18     Esophageal cancer, stage IV ([place])   01/08/2018 Imaging    CT AP W Constrast 01/08/18  IMPRESSION: 1. Nonobstructed, nondistended bowel. No acute inflammatory process. 2. Moderate-sized hiatal hernia noted. 3. Low-density 1.7 cm left adrenal nodule likely to represent a small adenoma. Lower pole left renal cyst measuring 1.8 cm. 4. Moderate aortic atherosclerosis. 5. Lumbar spondylosis and facet arthropathy.    01/27/2018 Imaging    CT Chest W Contrast 01/27/18  IMPRESSION: 1. Large mass involving the distal half of the esophagus spanning approximately 11.7 cm is identified compatible with primary esophageal  neoplasm. 2. Small posterior mediastinal and high right paratracheal lymph nodes are noted which may represent foci of metastatic adenopathy. 3. The small pulmonary nodules in the right lung are unchanged from 01/15/2016 and are favored to represent a benign process. 4. Aortic Atherosclerosis (ICD10-I70.0) and Emphysema (ICD10-J43.9). 5. Coronary artery atherosclerotic calcifications.    01/30/2018 Initial Diagnosis    Esophageal cancer, stage IV ([place])    02/03/2018 - 02/08/2018 Radiation Therapy    SBRT to oligo brain metastasis, in 3 fractions, under Dr. Isidore Moos     02/16/2018 PET scan    PET 02/16/18 IMPRESSION: 1. A 15.7 cm in length esophageal mass extends from the aortic arch level down to the distal esophagus, maximum SUV 22.4, compatible with malignancy. There is a small right lower neck level IV lymph node with lower than mediastinal blood pool activity, and a right upper paratracheal lymph node with just above mediastinal blood pool activity. 2. Faint speckled heterogeneity in the liver, for example in the lateral segment left hepatic lobe, is likely incidental/benign given the lack of correlate of CT finding. It might be prudent to obtain an MRI just to make sure that there is not a small early metastatic lesion in this vicinity. 3. Known right cerebellar vermian metastatic lesion, with expected hypo activity compared to the surrounding brain activity. 4. The right middle lobe pulmonary nodule measures 1.1 by 1.0 cm and is mildly hypermetabolic with maximum SUV 2.6. This lesion measured 6 by 5 mm back on 12/06/2003 and 1.2 by 1.1 cm on 10/15/2015. I am uncertain what to make of the very slow growth and mildly  accentuated metabolic activity. This could be a chronic granulomatous process. Low-grade adenocarcinoma of the lung seems unlikely to be so indolent as to only mildly increase over a 14 year period, but is presumably a differential diagnostic consideration.  Surveillance is suggested. 5. Other imaging findings of potential clinical significance: Aortic Atherosclerosis (ICD10-I70.0). Descending and sigmoid colon diverticulosis. Gas in the urinary bladder, most common cause would be recent catheterization. Minimal chronic left ethmoid sinusitis.    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 Ultrasound-guided biopsy is recommended.    01/10/2018 Initial Biopsy    Diagnsis from Breast Biopsy 01/10/18  Breast, right, needle core biopsy, 6 o'clock - INVASIVE DUCTAL CARCINOMA WITH CALCIFICATIONS, SEE COMMENT. - DUCTAL CARCINOMA IN SITU. 1 of 2 FINAL for Kristin Williams, Kristin Williams [phone]) Microscopic Comment The carcinoma appears grade 2. Prognostic markers will be ordered. Dr. [name] has reviewed the case. The case was called to [place] on 01/11/2018.    01/10/2018 Receptors her2    The tumor cells are NEGATIVE for Her2 (1+). Estrogen Receptor: 100%, POSITIVE, STRONG STAINING INTENSITY Progesterone Receptor: 100%, POSITIVE, STRONG STAINING INTENSITY Proliferation Marker Ki67: 15%    01/10/2018 Cancer Staging    Staging form: Breast, AJCC 8th Edition - Clinical stage from 01/10/2018: Stage IA (cT1b, cN0, cM0, G2, ER+, PR+, HER2-) - Signed by [name], MD on 02/03/2018    01/30/2018 Initial Diagnosis    Breast cancer ([place])    05/07/2018 -  Anti-estrogen oral therapy    Anastrozole  63m daily starting 05/07/2018      CURRENT THERAPY:  -Anastrozole 150mdaily started2/23/2020. Due to rash, switched to Letrozole 2.57m40mn 06/27/18.  -FOLFOX with leucovorin q2weeks starting 06/12/18  INTERVAL HISTORY:  BerLEOTTA WEINGARTEN here for a follow up and treatment. She presents to the clinic today by herself. She called her daughter on the phone to be included in the visit. She notes she is doing well. Her husband recently had a heart attack but is doing well now.  She notes rash that looks like petechiae was only on her breast and not has spread to her abdomen, arms. This started with anastrozole. She denies itching or pain from this.  She notes her speech is still effected by slurring which progresses through out the day. She notes her balance is good. She is starting to walk with cane more and walker less. Her legs are stronger now. She is on 4mg59mxa daily. Dr. VaslMickeal Skinnerns to do brain scan in May.  I reviewed medication list with her. She takes Lidocaine 2-3 times a day. She his very mild Odynophagia, stable.  She notes she tolerated FOLFOX well with only mild taste change, cold sensitivity, and neuropathy in her fingers which was from prior chemo, stable. She denies nausea.    REVIEW OF SYSTEMS:   Constitutional: Denies fevers, chills or abnormal weight loss Eyes: Denies blurriness of vision Ears, nose, mouth, throat, and face: Denies  mucositis or sore throat Respiratory: Denies cough, dyspnea or wheezes Cardiovascular: Denies palpitation, chest discomfort or lower extremity swelling Gastrointestinal:  Denies nausea, heartburn or change in bowel habits Skin: (+) Skin rash spread from breast to abdomen and arms  Lymphatics: Denies new lymphadenopathy or easy bruising Neurological:Denies new weaknesses (+) Slurred speech (+) Legs stronger (+) Stable neuropathy Behavioral/Psych: Mood is stable, no new changes  All other systems were reviewed with the patient and are negative.   MEDICAL HISTORY:  Past Medical History:  Diagnosis Date  . Arthritis   . Cancer (HCC)    Cervical  . Cancer ([place])    Breast  . Cancer ([place])    Vaginal  . Complication of anesthesia   . COPD (chronic obstructive pulmonary disease) ([place])   . Headache(784.0)   . History of radiation therapy 02/28/19- 04/07/18   Esophagus, 1.8 Gy in 28 fractions for a total dose of 50.4 Gy.   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   . Hyperlipidemia   . Leg pain   . Peripheral vascular disease ([place])   . Personal history of chemotherapy   . Personal history of radiation therapy   . PONV (postoperative nausea and vomiting)     SURGICAL HISTORY: Past Surgical History:  Procedure Laterality Date  . ABDOMINAL HYSTERECTOMY  1975  . BREAST LUMPECTOMY  2005  . GANGLION CYST EXCISION  1977  . ILIAC ARTERY STENT  04/20/2004   CSD right iliac occlusive disease  . IR IMAGING GUIDED PORT INSERTION  02/22/2018    I have reviewed the social history and family history with the patient and they are unchanged from previous note.  ALLERGIES:  is allergic to flonase [fluticasone propionate].  MEDICATIONS:  Current Outpatient Medications  Medication Sig Dispense Refill  . acetaminophen (TYLENOL) 160 MG/5ML liquid Take 325 mg by mouth every 4 (four) hours as needed for fever.    Marland Kitchen albuterol (PROVENTIL HFA;VENTOLIN HFA) 108 (90 Base) MCG/ACT inhaler Inhale 2 puffs into the lungs every 6 (six) hours as needed for wheezing or shortness of breath.    . dexamethasone (DECADRON) 4 MG tablet Take 34m daily in the AM 30 tablet 2  . furosemide (LASIX) 20 MG tablet Take 1 tablet (20 mg total) by mouth every other day. 15 tablet 0  . lidocaine (XYLOCAINE) 2 % solution MIX 1 PART 2% VISCOUS LIDOCAINE, 1 PART H20 SWALLOW 10ML OF DILUTED MIXTURE, 20 MIN BEFORE MEALS AND AT BEDTIME UP TO 3 TIMES DAILY 300 mL 1  . lidocaine-prilocaine (EMLA) cream Apply 1 application  topically as needed. 30 g 1  . LORazepam (ATIVAN) 1 MG tablet Take 0.5 tablets (0.5 mg total) by mouth 3 (three) times daily as needed for anxiety or sleep. 45 tablet 0  . metoCLOPramide (REGLAN) 10 MG tablet Take 1 tablet (10 mg total) by mouth every 6 (six) hours as needed. for nausea 60 tablet 1  . omeprazole (PRILOSEC) 40 MG capsule Take 40 mg by mouth daily.    . ondansetron (ZOFRAN) 8 MG tablet Take 1 tablet (8 mg total) by mouth every 8 (eight) hours as needed for nausea or vomiting. 20 tablet 0  . POTASSIUM CHLORIDE PO Take 20 mEq by mouth daily.     . sucralfate (CARAFATE) 1 g tablet DISSOLVE ONE TABLET IN 10 MLS OF WATER AND SWALLOW UP TO FOUR TIMES DAILY TO SOOTHE THROAT 40 tablet 2  . traZODone (DESYREL) 50 MG  tablet Take 1 tablet (50 mg total) by mouth at bedtime. 30 tablet 1  . warfarin (COUMADIN) 5 MG tablet Take 1 tablet (5 mg total) by mouth daily. 30 tablet 3  . letrozole (FEMARA) 2.5 MG tablet Take 1 tablet (2.5 mg total) by mouth daily. 30 tablet 3   No current facility-administered medications for this visit.     PHYSICAL EXAMINATION: ECOG PERFORMANCE STATUS: 2 - Symptomatic, <50% confined to bed  Vitals:   06/26/18 1107  BP: 134/67  Pulse: 67  Resp: 18  Temp: 97.8 F (36.6 C)  SpO2: 98%   Filed Weights   06/26/18 1107  Weight: 190 lb 12.8 oz (86.5 kg)    GENERAL:alert, no distress and comfortable SKIN: skin color, texture, turgor are normal, no rashes or significant lesions EYES: normal, Conjunctiva are pink and non-injected, sclera clear OROPHARYNX:no exudate, no erythema and lips, buccal mucosa, and tongue normal  NECK: supple, thyroid normal size, non-tender, without nodularity LYMPH:  no palpable lymphadenopathy in the cervical, axillary or inguinal LUNGS: clear to auscultation and percussion with normal breathing effort HEART: regular rate & rhythm and no murmurs and no lower extremity edema ABDOMEN:abdomen soft, non-tender and normal bowel sounds  Musculoskeletal:no cyanosis of digits and no clubbing  NEURO: alert & oriented x 3 with fluent speech, no focal motor/sensory deficits  LABORATORY DATA:  I have reviewed the data as listed CBC Latest Ref Rng & Units 06/26/2018 06/12/2018 06/05/2018  WBC 4.0 - 10.5 K/uL 6.4 12.0(H) 6.5  Hemoglobin 12.0 - 15.0 g/dL 11.5(L) 12.6 12.1  Hematocrit 36.0 - 46.0 % 35.5(L) 38.6 37.3  Platelets 150 - 400 K/uL 161 215 182     CMP Latest Ref Rng & Units 06/26/2018 06/12/2018 06/05/2018  Glucose 70 - 99 mg/dL 96 114(H) 85  BUN 8 - 23 mg/dL '17 12 14  ' Creatinine 0.44 - 1.00 mg/dL 0.90 0.83 0.74  Sodium 135 - 145 mmol/L 138 137 136  Potassium 3.5 - 5.1 mmol/L 3.7 3.5 4.0  Chloride 98 - 111 mmol/L 105 104 104  CO2 22 - 32 mmol/L '24 23 23  ' Calcium 8.9 - 10.3 mg/dL 8.6(L) 8.9 9.1  Total Protein 6.5 - 8.1 g/dL 5.8(L) 6.3(L) 6.0(L)  Total Bilirubin 0.3 - 1.2 mg/dL 0.3 0.3 0.3  Alkaline Phos 38 - 126 U/L 85 101 94  AST 15 - 41 U/L '15 15 16  ' ALT 0 - 44 U/L '18 22 22      ' RADIOGRAPHIC STUDIES: I have personally reviewed the radiological images as listed and agreed with the findings in the report. No results found.   ASSESSMENT & PLAN:  SARABELLA CAPRIO is a 73 y.o. female with   1.EsophagealAdenocarcinoma,with oligo met incerebellum, stage IV, HER2(-), MSS, and PD-L1(-) -She was diagnosed with esophogeal cancerin 01/2018,with oligometastasisto the brain. Given her stage IV disease her cancer is not curable but still treatable. Goal of care it to control her disease.  -She completedSBRTto her brain meton11/27/2019and completed chemoRTto primary tumoron 04/07/18.She recovered well.  -Her molecular testing on the tumor showed MSI stable disease, PD-L1 negative, she is not a candidate for immunotherapy. Her2was also negative,not a candidate for EGFR inhibitor. -She started consolidation chemotherapytreatment with FOLFOX q2weeksfor 3 monthson 06/12/18 tobettercontrol her disease. -She  tolerated her first cycle well with mild cold sensitivity and taste change.  -Physical exam today showed increase of rash from anastrozole. Will switch to letrozole.  -Labs reviewed, CBC WNL except Hg 11.5, CMP unremarkable. Overall adequate to proceed with  FOLFOX and leucovorin. Will start to titrate up oxaliplatin dose.  -given COVID-19 will post pone another scan until after 3 months chemo treatment. Will scan sooner if there are concerning symptoms.  -f/u in 2 weeks   2. Dysphagia andodynophagia, nausea  -secondary toher esophageal cancer and chemoradiation. -she is recovering well overall.She hasnot needed pain medicationlately -Continue Lidocaine and Sulcrafate with eating. I encouraged her to not take Lidocaine more than 3 times a day to prevent over exposure to heart.  -She will continue Reglan, ativan and Zofran as needed.  -Stable Odynophagiathat is only present with her initial start to eating and taking certain pills and has improved with smaller bites followed by water. Weight remains stable. -No nausea with start of FOLFOX.  3. RightBreastInvasive Ductal Carcinoma,cT1bN0M0 stage IA,Grade II, ER/PR Positive, HER 2 negative -Diagnosed through screening mammogram, same time as her esophageal cancer in October 2019.  -Given her early stage breast cancer, I previously discussedher breast cancer treatment is less urgent than esophogeal cancertreatment. -Given her EP/PR breast cancer and post-menopausal status,she startedantiestrogen therapy with anastrozoleon 05/07/18, and tolerating wellexcept recent tiny bruising of her breasts and abdomen. Will monitor. -Her breasts surgery has been held due to her metastatic esophageal cancer. -She has developed mild skin rash on her chest and abdomen after she started anastrozole, not symptomatic, will continue monitoring.  -Her rash has spread more on abdomen and now arms. Will stop anastrozole and prescribe her letrozole to  start tomorrow (06/27/18). I reviewed possible side effects. She is agreeable.   4.Oligo brain mets, Insomnia -This is likely from her primaryesophogealcancer.  -She does have balance issues and ambulates with assistance.  -She is s/p SBRT. Hermild slurred speech, right sided weaknessand balance stabilityhas muchimproved -Will monitor with brain scan every 3 months. -She had a brain MRI on 2/14/2020and shows decreases in brain met but has considerable increase in edema of cerebellum -Dr. Mickeal Skinner previously increased her dexa to 54m daily. -Dexa exacerbated her insomnia even with ativan to 1.5 tabs. I previously called in Trazodone 596mnightly as needed (06/12/18)  -Her speech continues to be slurred which progresses through out the day. Her leg strength is improving and she is able to walk with cane, other than her walker.   -Continue to f/u with Dr. VaMickeal Skinnerhe plans to do brain scan in May, 2020.   5. H/o Left breast cancer, ER/PR negative, treated by Dr. MaJana Hakimith lumpectomy, adj, chemo and radiation. -She was previously referred to genetics  -no evidence of recurrence   6.Acute deep vein thrombosis (DVT) of popliteal vein of left lower extremity, diagnosed on 02/28/2018  -She was found to have left lower extremity edema on his first day of chemoradiation, Doppler showed DVT -She is currently on coumadin 9m42maily,INR still pending today (06/26/18).  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  -Stable, did not worsen with start of FOLFOX  8.Goal of care discussion  -she is full code now -Patient understand her treatment is palliative to prolong her life,her cancer is not curable unfortunately.  9.COPD -Shehas chronic cough -She presented to ED for COPD excerebration on 04/12/18. She has recovered and her breathing is improved. -stable    PLAN: -I refilled her lidocaine solution, Reglan and Sucralfate. I prescribed her Letrozole to start tomorrow and she will stop anastrozole.  -Labs reviewed and adequate to proceed with FOLFOX and Leucovorin today  -Lab, flush,  f/u and treatment in 2 weeks    No problem-specific Assessment & Plan notes found for this encounter.   No orders of the defined types were placed in this encounter.  All questions were answered. The patient knows to call the clinic with any problems, questions or concerns. No barriers to learning was detected. I spent 20 minutes counseling the patient face to face. The total time spent in the appointment was 25 minutes and more than 50% was on counseling and review of test results     [name], MD 06/26/2018   I, Joslyn Devon, am acting as scribe for [name], MD.   I have reviewed the above documentation for accuracy and completeness, and I agree with the above.

## 2018-06-26 ENCOUNTER — Inpatient Hospital Stay: Payer: Medicare Other

## 2018-06-26 ENCOUNTER — Inpatient Hospital Stay (HOSPITAL_BASED_OUTPATIENT_CLINIC_OR_DEPARTMENT_OTHER): Payer: Medicare Other | Admitting: Hematology

## 2018-06-26 ENCOUNTER — Encounter: Payer: Self-pay | Admitting: Hematology

## 2018-06-26 ENCOUNTER — Telehealth: Payer: Self-pay | Admitting: Hematology

## 2018-06-26 ENCOUNTER — Other Ambulatory Visit: Payer: Self-pay

## 2018-06-26 ENCOUNTER — Other Ambulatory Visit: Payer: Self-pay | Admitting: *Deleted

## 2018-06-26 VITALS — BP 134/67 | HR 67 | Temp 97.8°F | Resp 18 | Ht 60.0 in | Wt 190.8 lb

## 2018-06-26 DIAGNOSIS — Z7189 Other specified counseling: Secondary | ICD-10-CM | POA: Diagnosis not present

## 2018-06-26 DIAGNOSIS — R1310 Dysphagia, unspecified: Secondary | ICD-10-CM | POA: Diagnosis not present

## 2018-06-26 DIAGNOSIS — C159 Malignant neoplasm of esophagus, unspecified: Secondary | ICD-10-CM

## 2018-06-26 DIAGNOSIS — I82432 Acute embolism and thrombosis of left popliteal vein: Secondary | ICD-10-CM

## 2018-06-26 DIAGNOSIS — J449 Chronic obstructive pulmonary disease, unspecified: Secondary | ICD-10-CM | POA: Diagnosis not present

## 2018-06-26 DIAGNOSIS — Z853 Personal history of malignant neoplasm of breast: Secondary | ICD-10-CM

## 2018-06-26 DIAGNOSIS — Z5111 Encounter for antineoplastic chemotherapy: Secondary | ICD-10-CM | POA: Diagnosis not present

## 2018-06-26 DIAGNOSIS — R4781 Slurred speech: Secondary | ICD-10-CM

## 2018-06-26 DIAGNOSIS — G629 Polyneuropathy, unspecified: Secondary | ICD-10-CM | POA: Diagnosis not present

## 2018-06-26 DIAGNOSIS — C7931 Secondary malignant neoplasm of brain: Secondary | ICD-10-CM | POA: Diagnosis not present

## 2018-06-26 DIAGNOSIS — G936 Cerebral edema: Secondary | ICD-10-CM

## 2018-06-26 DIAGNOSIS — C50911 Malignant neoplasm of unspecified site of right female breast: Secondary | ICD-10-CM | POA: Diagnosis not present

## 2018-06-26 DIAGNOSIS — Z452 Encounter for adjustment and management of vascular access device: Secondary | ICD-10-CM | POA: Diagnosis not present

## 2018-06-26 DIAGNOSIS — R21 Rash and other nonspecific skin eruption: Secondary | ICD-10-CM | POA: Diagnosis not present

## 2018-06-26 DIAGNOSIS — I824Z2 Acute embolism and thrombosis of unspecified deep veins of left distal lower extremity: Secondary | ICD-10-CM

## 2018-06-26 DIAGNOSIS — C154 Malignant neoplasm of middle third of esophagus: Secondary | ICD-10-CM

## 2018-06-26 LAB — CMP (CANCER CENTER ONLY)
ALT: 18 U/L (ref 0–44)
AST: 15 U/L (ref 15–41)
Albumin: 2.9 g/dL — ABNORMAL LOW (ref 3.5–5.0)
Alkaline Phosphatase: 85 U/L (ref 38–126)
Anion gap: 9 (ref 5–15)
BUN: 17 mg/dL (ref 8–23)
CO2: 24 mmol/L (ref 22–32)
Calcium: 8.6 mg/dL — ABNORMAL LOW (ref 8.9–10.3)
Chloride: 105 mmol/L (ref 98–111)
Creatinine: 0.9 mg/dL (ref 0.44–1.00)
GFR, Est AFR Am: >60 mL/min
GFR, Estimated: >60 mL/min
Glucose, Bld: 96 mg/dL (ref 70–99)
Potassium: 3.7 mmol/L (ref 3.5–5.1)
Sodium: 138 mmol/L (ref 135–145)
Total Bilirubin: 0.3 mg/dL (ref 0.3–1.2)
Total Protein: 5.8 g/dL — ABNORMAL LOW (ref 6.5–8.1)

## 2018-06-26 LAB — CBC WITH DIFFERENTIAL (CANCER CENTER ONLY)
Abs Immature Granulocytes: 0.06 10*3/uL (ref 0.00–0.07)
Basophils Absolute: 0 10*3/uL (ref 0.0–0.1)
Basophils Relative: 0 %
Eosinophils Absolute: 0.1 10*3/uL (ref 0.0–0.5)
Eosinophils Relative: 2 %
HCT: 35.5 % — ABNORMAL LOW (ref 36.0–46.0)
Hemoglobin: 11.5 g/dL — ABNORMAL LOW (ref 12.0–15.0)
Immature Granulocytes: 1 %
Lymphocytes Relative: 4 %
Lymphs Abs: 0.3 10*3/uL — ABNORMAL LOW (ref 0.7–4.0)
MCH: 33.4 pg (ref 26.0–34.0)
MCHC: 32.4 g/dL (ref 30.0–36.0)
MCV: 103.2 fL — ABNORMAL HIGH (ref 80.0–100.0)
Monocytes Absolute: 0.6 10*3/uL (ref 0.1–1.0)
Monocytes Relative: 10 %
Neutro Abs: 5.3 10*3/uL (ref 1.7–7.7)
Neutrophils Relative %: 83 %
Platelet Count: 161 10*3/uL (ref 150–400)
RBC: 3.44 MIL/uL — ABNORMAL LOW (ref 3.87–5.11)
RDW: 15.4 % (ref 11.5–15.5)
WBC Count: 6.4 10*3/uL (ref 4.0–10.5)
nRBC: 0.3 % — ABNORMAL HIGH (ref 0.0–0.2)

## 2018-06-26 LAB — PROTIME-INR
INR: 3.4 — ABNORMAL HIGH (ref 0.8–1.2)
Prothrombin Time: 34 seconds — ABNORMAL HIGH (ref 11.4–15.2)

## 2018-06-26 MED ORDER — DEXTROSE 5 % IV SOLN
Freq: Once | INTRAVENOUS | Status: AC
  Administered 2018-06-26: 13:00:00 via INTRAVENOUS
  Filled 2018-06-26: qty 250

## 2018-06-26 MED ORDER — PALONOSETRON HCL INJECTION 0.25 MG/5ML
0.2500 mg | Freq: Once | INTRAVENOUS | Status: AC
  Administered 2018-06-26: 0.25 mg via INTRAVENOUS

## 2018-06-26 MED ORDER — OXALIPLATIN CHEMO INJECTION 100 MG/20ML
75.0000 mg/m2 | Freq: Once | INTRAVENOUS | Status: AC
  Administered 2018-06-26: 145 mg via INTRAVENOUS
  Filled 2018-06-26: qty 20

## 2018-06-26 MED ORDER — SUCRALFATE 1 G PO TABS: ORAL_TABLET | ORAL | 2 refills | Status: DC

## 2018-06-26 MED ORDER — METOCLOPRAMIDE HCL 10 MG PO TABS: 10.0000 mg | ORAL_TABLET | Freq: Four times a day (QID) | ORAL | 1 refills | Status: DC | PRN

## 2018-06-26 MED ORDER — LETROZOLE 2.5 MG PO TABS: 2.5000 mg | ORAL_TABLET | Freq: Every day | ORAL | 3 refills | Status: DC

## 2018-06-26 MED ORDER — SODIUM CHLORIDE 0.9 % IV SOLN
2400.0000 mg/m2 | INTRAVENOUS | Status: DC
  Administered 2018-06-26: 4600 mg via INTRAVENOUS
  Filled 2018-06-26: qty 92

## 2018-06-26 MED ORDER — DEXAMETHASONE SODIUM PHOSPHATE 10 MG/ML IJ SOLN
10.0000 mg | Freq: Once | INTRAMUSCULAR | Status: AC
  Administered 2018-06-26: 10 mg via INTRAVENOUS

## 2018-06-26 MED ORDER — LIDOCAINE VISCOUS HCL 2 % MT SOLN: OROMUCOSAL | 1 refills | Status: DC

## 2018-06-26 MED ORDER — LEUCOVORIN CALCIUM INJECTION 350 MG
400.0000 mg/m2 | Freq: Once | INTRAVENOUS | Status: AC
  Administered 2018-06-26: 14:00:00 764 mg via INTRAVENOUS
  Filled 2018-06-26: qty 38.2

## 2018-06-26 NOTE — Patient Instructions (Signed)
[facility] Discharge Instructions for Patients Receiving Chemotherapy  Today you received the following chemotherapy agents Oxaliplatin, Leucovorin, 5-FU. To help prevent nausea and vomiting after your treatment, we encourage you to take your nausea medication. DO NOT TAKE ZOFRAN FOR THREE DAYS AFTER TREATMENT.   If you develop nausea and vomiting that is not controlled by your nausea medication, call the clinic.   BELOW ARE SYMPTOMS THAT SHOULD BE REPORTED IMMEDIATELY:  *FEVER GREATER THAN 100.5 F  *CHILLS WITH OR WITHOUT FEVER  NAUSEA AND VOMITING THAT IS NOT CONTROLLED WITH YOUR NAUSEA MEDICATION  *UNUSUAL SHORTNESS OF BREATH  *UNUSUAL BRUISING OR BLEEDING  TENDERNESS IN MOUTH AND THROAT WITH OR WITHOUT PRESENCE OF ULCERS  *URINARY PROBLEMS  *BOWEL PROBLEMS  UNUSUAL RASH Items with * indicate a potential emergency and should be followed up as soon as possible.  Feel free to call the clinic should you have any questions or concerns. The clinic phone number is [phone].  Please show the [facility] at check-in to the Emergency Department and triage nurse.  Oxaliplatin Injection What is this medicine? OXALIPLATIN (ox AL i PLA tin) is a chemotherapy drug. It targets fast dividing cells, like cancer cells, and causes these cells to die. This medicine is used to treat cancers of the colon and rectum, and many other cancers. This medicine may be used for other purposes; ask your health care provider or pharmacist if you have questions. COMMON BRAND NAME(S): Eloxatin What should I tell my health care provider before I take this medicine? They need to know if you have any of these conditions: -kidney disease -an unusual or allergic reaction to oxaliplatin, other chemotherapy, other medicines, foods, dyes, or preservatives -pregnant or trying to get pregnant -breast-feeding How should I use this medicine? This drug is given as an infusion into a vein.  It is administered in a hospital or clinic by a specially trained health care professional. Talk to your pediatrician regarding the use of this medicine in children. Special care may be needed. Overdosage: If you think you have taken too much of this medicine contact a poison control center or emergency room at once. NOTE: This medicine is only for you. Do not share this medicine with others. What if I miss a dose? It is important not to miss a dose. Call your doctor or health care professional if you are unable to keep an appointment. What may interact with this medicine? -medicines to increase blood counts like filgrastim, pegfilgrastim, sargramostim -probenecid -some antibiotics like amikacin, gentamicin, neomycin, polymyxin B, streptomycin, tobramycin -zalcitabine Talk to your doctor or health care professional before taking any of these medicines: -acetaminophen -aspirin -ibuprofen -ketoprofen -naproxen This list may not describe all possible interactions. Give your health care provider a list of all the medicines, herbs, non-prescription drugs, or dietary supplements you use. Also tell them if you smoke, drink alcohol, or use illegal drugs. Some items may interact with your medicine. What should I watch for while using this medicine? Your condition will be monitored carefully while you are receiving this medicine. You will need important blood work done while you are taking this medicine. This medicine can make you more sensitive to cold. Do not drink cold drinks or use ice. Cover exposed skin before coming in contact with cold temperatures or cold objects. When out in cold weather wear warm clothing and cover your mouth and nose to warm the air that goes into your lungs. Tell your doctor if you  get sensitive to the cold. This drug may make you feel generally unwell. This is not uncommon, as chemotherapy can affect healthy cells as well as cancer cells. Report any side effects. Continue  your course of treatment even though you feel ill unless your doctor tells you to stop. In some cases, you may be given additional medicines to help with side effects. Follow all directions for their use. Call your doctor or health care professional for advice if you get a fever, chills or sore throat, or other symptoms of a cold or flu. Do not treat yourself. This drug decreases your body's ability to fight infections. Try to avoid being around people who are sick. This medicine may increase your risk to bruise or bleed. Call your doctor or health care professional if you notice any unusual bleeding. Be careful brushing and flossing your teeth or using a toothpick because you may get an infection or bleed more easily. If you have any dental work done, tell your dentist you are receiving this medicine. Avoid taking products that contain aspirin, acetaminophen, ibuprofen, naproxen, or ketoprofen unless instructed by your doctor. These medicines may hide a fever. Do not become pregnant while taking this medicine. Women should inform their doctor if they wish to become pregnant or think they might be pregnant. There is a potential for serious side effects to an unborn child. Talk to your health care professional or pharmacist for more information. Do not breast-feed an infant while taking this medicine. Call your doctor or health care professional if you get diarrhea. Do not treat yourself. What side effects may I notice from receiving this medicine? Side effects that you should report to your doctor or health care professional as soon as possible: -allergic reactions like skin rash, itching or hives, swelling of the face, lips, or tongue -low blood counts - This drug may decrease the number of white blood cells, red blood cells and platelets. You may be at increased risk for infections and bleeding. -signs of infection - fever or chills, cough, sore throat, pain or difficulty passing urine -signs of  decreased platelets or bleeding - bruising, pinpoint red spots on the skin, black, tarry stools, nosebleeds -signs of decreased red blood cells - unusually weak or tired, fainting spells, lightheadedness -breathing problems -chest pain, pressure -cough -diarrhea -jaw tightness -mouth sores -nausea and vomiting -pain, swelling, redness or irritation at the injection site -pain, tingling, numbness in the hands or feet -problems with balance, talking, walking -redness, blistering, peeling or loosening of the skin, including inside the mouth -trouble passing urine or change in the amount of urine Side effects that usually do not require medical attention (report to your doctor or health care professional if they continue or are bothersome): -changes in vision -constipation -hair loss -loss of appetite -metallic taste in the mouth or changes in taste -stomach pain This list may not describe all possible side effects. Call your doctor for medical advice about side effects. You may report side effects to FDA at 1-800-FDA-1088. Where should I keep my medicine? This drug is given in a hospital or clinic and will not be stored at home. NOTE: This sheet is a summary. It may not cover all possible information. If you have questions about this medicine, talk to your doctor, pharmacist, or health care provider.  2019 Elsevier/Gold Standard (2007-09-26 17:22:47) Leucovorin injection What is this medicine? LEUCOVORIN (loo koe VOR in) is used to prevent or treat the harmful effects of some medicines. This medicine  is used to treat anemia caused by a low amount of folic acid in the body. It is also used with 5-fluorouracil (5-FU) to treat colon cancer. This medicine may be used for other purposes; ask your health care provider or pharmacist if you have questions. What should I tell my health care provider before I take this medicine? They need to know if you have any of these conditions: -anemia from  low levels of vitamin B-12 in the blood -an unusual or allergic reaction to leucovorin, folic acid, other medicines, foods, dyes, or preservatives -pregnant or trying to get pregnant -breast-feeding How should I use this medicine? This medicine is for injection into a muscle or into a vein. It is given by a health care professional in a hospital or clinic setting. Talk to your pediatrician regarding the use of this medicine in children. Special care may be needed. Overdosage: If you think you have taken too much of this medicine contact a poison control center or emergency room at once. NOTE: This medicine is only for you. Do not share this medicine with others. What if I miss a dose? This does not apply. What may interact with this medicine? -capecitabine -fluorouracil -phenobarbital -phenytoin -primidone -trimethoprim-sulfamethoxazole This list may not describe all possible interactions. Give your health care provider a list of all the medicines, herbs, non-prescription drugs, or dietary supplements you use. Also tell them if you smoke, drink alcohol, or use illegal drugs. Some items may interact with your medicine. What should I watch for while using this medicine? Your condition will be monitored carefully while you are receiving this medicine. This medicine may increase the side effects of 5-fluorouracil, 5-FU. Tell your doctor or health care professional if you have diarrhea or mouth sores that do not get better or that get worse. What side effects may I notice from receiving this medicine? Side effects that you should report to your doctor or health care professional as soon as possible: -allergic reactions like skin rash, itching or hives, swelling of the face, lips, or tongue -breathing problems -fever, infection -mouth sores -unusual bleeding or bruising -unusually weak or tired Side effects that usually do not require medical attention (report to your doctor or health care  professional if they continue or are bothersome): -constipation or diarrhea -loss of appetite -nausea, vomiting This list may not describe all possible side effects. Call your doctor for medical advice about side effects. You may report side effects to FDA at 1-800-FDA-1088. Where should I keep my medicine? This drug is given in a hospital or clinic and will not be stored at home. NOTE: This sheet is a summary. It may not cover all possible information. If you have questions about this medicine, talk to your doctor, pharmacist, or health care provider.  2019 Elsevier/Gold Standard (2007-09-05 16:50:29) Ferumoxytol injection What is this medicine? FERUMOXYTOL is an iron complex. Iron is used to make healthy red blood cells, which carry oxygen and nutrients throughout the body. This medicine is used to treat iron deficiency anemia. This medicine may be used for other purposes; ask your health care provider or pharmacist if you have questions. COMMON BRAND NAME(S): Feraheme What should I tell my health care provider before I take this medicine? They need to know if you have any of these conditions: -anemia not caused by low iron levels -high levels of iron in the blood -magnetic resonance imaging (MRI) test scheduled -an unusual or allergic reaction to iron, other medicines, foods, dyes, or preservatives -pregnant  or trying to get pregnant -breast-feeding How should I use this medicine? This medicine is for injection into a vein. It is given by a health care professional in a hospital or clinic setting. Talk to your pediatrician regarding the use of this medicine in children. Special care may be needed. Overdosage: If you think you have taken too much of this medicine contact a poison control center or emergency room at once. NOTE: This medicine is only for you. Do not share this medicine with others. What if I miss a dose? It is important not to miss your dose. Call your doctor or health  care professional if you are unable to keep an appointment. What may interact with this medicine? This medicine may interact with the following medications: -other iron products This list may not describe all possible interactions. Give your health care provider a list of all the medicines, herbs, non-prescription drugs, or dietary supplements you use. Also tell them if you smoke, drink alcohol, or use illegal drugs. Some items may interact with your medicine. What should I watch for while using this medicine? Visit your doctor or healthcare professional regularly. Tell your doctor or healthcare professional if your symptoms do not start to get better or if they get worse. You may need blood work done while you are taking this medicine. You may need to follow a special diet. Talk to your doctor. Foods that contain iron include: whole grains/cereals, dried fruits, beans, or peas, leafy green vegetables, and organ meats (liver, kidney). What side effects may I notice from receiving this medicine? Side effects that you should report to your doctor or health care professional as soon as possible: -allergic reactions like skin rash, itching or hives, swelling of the face, lips, or tongue -breathing problems -changes in blood pressure -feeling faint or lightheaded, falls -fever or chills -flushing, sweating, or hot feelings -swelling of the ankles or feet Side effects that usually do not require medical attention (report to your doctor or health care professional if they continue or are bothersome): -diarrhea -headache -nausea, vomiting -stomach pain This list may not describe all possible side effects. Call your doctor for medical advice about side effects. You may report side effects to FDA at 1-800-FDA-1088. Where should I keep my medicine? This drug is given in a hospital or clinic and will not be stored at home. NOTE: This sheet is a summary. It may not cover all possible information. If you  have questions about this medicine, talk to your doctor, pharmacist, or health care provider.  2019 Elsevier/Gold Standard (2016-04-19 20:21:10)

## 2018-06-26 NOTE — Telephone Encounter (Signed)
Scheduled appt per 4/13 los. °

## 2018-06-27 ENCOUNTER — Other Ambulatory Visit: Payer: Self-pay | Admitting: Hematology

## 2018-06-27 ENCOUNTER — Telehealth: Payer: Self-pay | Admitting: *Deleted

## 2018-06-27 NOTE — Telephone Encounter (Signed)
[name] patient regarding INR results from 06/26/18.  Spoke with patient. Per Dr. Ernestina Penna orders-advised pt to continue with Coumadin 5 mg daily, except on Wednesdays when she is to take 1/2 tablet (2.5 mg). Pt was able to repeat back to me the instructions.  No further questions or concerns

## 2018-06-27 NOTE — Telephone Encounter (Signed)
-----   Message from [name], MD sent at 06/27/2018 12:36 PM EDT -----  ----- Message ----- From: [name], MD Sent: 06/26/2018  11:02 PM EDT To: [facility]  [facility], please let pt know her INR was 3.4 yesterday. She is on coumadin 5mg  daily, please let her continue except 2.5mg  on Wednesdays, thanks   [name]  06/26/2018

## 2018-06-28 ENCOUNTER — Other Ambulatory Visit: Payer: Self-pay | Admitting: Hematology

## 2018-06-28 ENCOUNTER — Inpatient Hospital Stay: Payer: Medicare Other

## 2018-06-28 ENCOUNTER — Other Ambulatory Visit: Payer: Self-pay

## 2018-06-28 VITALS — BP 139/81 | HR 78 | Temp 98.0°F | Resp 18

## 2018-06-28 DIAGNOSIS — I82432 Acute embolism and thrombosis of left popliteal vein: Secondary | ICD-10-CM | POA: Diagnosis not present

## 2018-06-28 DIAGNOSIS — Z452 Encounter for adjustment and management of vascular access device: Secondary | ICD-10-CM | POA: Diagnosis not present

## 2018-06-28 DIAGNOSIS — C159 Malignant neoplasm of esophagus, unspecified: Secondary | ICD-10-CM | POA: Diagnosis not present

## 2018-06-28 DIAGNOSIS — Z95828 Presence of other vascular implants and grafts: Secondary | ICD-10-CM

## 2018-06-28 DIAGNOSIS — Z5111 Encounter for antineoplastic chemotherapy: Secondary | ICD-10-CM | POA: Diagnosis not present

## 2018-06-28 DIAGNOSIS — C50911 Malignant neoplasm of unspecified site of right female breast: Secondary | ICD-10-CM | POA: Diagnosis not present

## 2018-06-28 DIAGNOSIS — C7931 Secondary malignant neoplasm of brain: Secondary | ICD-10-CM | POA: Diagnosis not present

## 2018-06-28 DIAGNOSIS — I824Z2 Acute embolism and thrombosis of unspecified deep veins of left distal lower extremity: Secondary | ICD-10-CM

## 2018-06-28 MED ORDER — SODIUM CHLORIDE 0.9% FLUSH
10.0000 mL | Freq: Once | INTRAVENOUS | Status: AC
  Administered 2018-06-28: 10 mL
  Filled 2018-06-28: qty 10

## 2018-06-28 MED ORDER — ENOXAPARIN SODIUM 150 MG/ML ~~LOC~~ SOLN: 140.0000 mg | Freq: Once | SUBCUTANEOUS | Status: DC

## 2018-06-28 MED ORDER — HEPARIN SOD (PORK) LOCK FLUSH 100 UNIT/ML IV SOLN
500.0000 [IU] | Freq: Once | INTRAVENOUS | Status: AC
  Administered 2018-06-28: 500 [IU]
  Filled 2018-06-28: qty 5

## 2018-07-03 ENCOUNTER — Telehealth: Payer: Self-pay

## 2018-07-03 ENCOUNTER — Other Ambulatory Visit: Payer: Self-pay

## 2018-07-03 DIAGNOSIS — K1230 Oral mucositis (ulcerative), unspecified: Secondary | ICD-10-CM

## 2018-07-03 DIAGNOSIS — C7931 Secondary malignant neoplasm of brain: Secondary | ICD-10-CM

## 2018-07-03 MED ORDER — MAGIC MOUTHWASH: 5.0000 mL | Freq: Four times a day (QID) | ORAL | 0 refills | Status: DC

## 2018-07-03 NOTE — Telephone Encounter (Signed)
Patient's daughter calls stating she has developed mouth sores, informed her we will send in [facility] to [facility] in [place].  She also states the pharmacy needs PA for letrozole.  I have faxed the pharmacy and requested that information be sent to [place] so we can obtain.

## 2018-07-04 ENCOUNTER — Other Ambulatory Visit: Payer: Self-pay | Admitting: Hematology

## 2018-07-07 NOTE — Progress Notes (Signed)
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    01/30/2018 Initial Diagnosis    Metastasis to brain [facility])    01/31/2018 Imaging    MRI Brain W WO Contrast 01/31/18  IMPRESSION: 3 Tesla study does not disclose any additional lesions. Solitary metastasis in the right superior cerebellum measuring 2.9 x 2.6 x 3.0 cm with mild surrounding edema. 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    02/03/2018 - 02/08/2018 Radiation Therapy    SBRT with Dr. Isidore Moos on 02/03/18, 02/06/18, 02/08/18     Esophageal cancer, stage IV ([place])   01/08/2018 Imaging    CT AP W Constrast 01/08/18  IMPRESSION: 1. Nonobstructed, nondistended bowel. No acute inflammatory process. 2. Moderate-sized hiatal hernia noted. 3. Low-density 1.7 cm left adrenal nodule likely to represent a small adenoma. Lower pole left renal cyst measuring 1.8 cm. 4. Moderate aortic atherosclerosis. 5. Lumbar spondylosis and facet arthropathy.    01/27/2018 Imaging    CT Chest W Contrast 01/27/18  IMPRESSION: 1. Large mass involving the distal half of the esophagus spanning approximately 11.7 cm is identified compatible with primary esophageal  neoplasm. 2. Small posterior mediastinal and high right paratracheal lymph nodes are noted which may represent foci of metastatic adenopathy. 3. The small pulmonary nodules in the right lung are unchanged from 01/15/2016 and are favored to represent a benign process. 4. Aortic Atherosclerosis (ICD10-I70.0) and Emphysema (ICD10-J43.9). 5. Coronary artery atherosclerotic calcifications.    01/30/2018 Initial Diagnosis    Esophageal cancer, stage IV ([place])    02/03/2018 - 02/08/2018 Radiation Therapy    SBRT to oligo brain metastasis, in 3 fractions, under Dr. Isidore Moos     02/16/2018 PET scan    PET 02/16/18 IMPRESSION: 1. A 15.7 cm in length esophageal mass extends from the aortic arch level down to the distal esophagus, maximum SUV 22.4, compatible with malignancy. There is a small right lower neck level IV lymph node with lower than mediastinal blood pool activity, and a right upper paratracheal lymph node with just above mediastinal blood pool activity. 2. Faint speckled heterogeneity in the liver, for example in the lateral segment left hepatic lobe, is likely incidental/benign given the lack of correlate of CT finding. It might be prudent to obtain an MRI just to make sure that there is not a small early metastatic lesion in this vicinity. 3. Known right cerebellar vermian metastatic lesion, with expected hypo activity compared to the surrounding brain activity. 4. The right middle lobe pulmonary nodule measures 1.1 by 1.0 cm and is mildly hypermetabolic with maximum SUV 2.6. This lesion measured 6 by 5 mm back on 12/06/2003 and 1.2 by 1.1 cm on 10/15/2015. I am uncertain what to make of the very slow growth and mildly  accentuated metabolic activity. This could be a chronic granulomatous process. Low-grade adenocarcinoma of the lung seems unlikely to be so indolent as to only mildly increase over a 14 year period, but is presumably a differential diagnostic  consideration. Surveillance is suggested. 5. Other imaging findings of potential clinical significance: Aortic Atherosclerosis (ICD10-I70.0). Descending and sigmoid colon diverticulosis. Gas in the urinary bladder, most common cause would be recent catheterization. Minimal chronic left ethmoid sinusitis.    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 Ultrasound-guided biopsy is recommended.    01/10/2018 Initial Biopsy    Diagnsis from Breast Biopsy 01/10/18  Breast, right, needle core biopsy, 6 o'clock - INVASIVE DUCTAL CARCINOMA WITH CALCIFICATIONS, SEE COMMENT. - DUCTAL CARCINOMA IN SITU. 1 of 2 FINAL for Kristin Williams, Kristin Williams [phone]) Microscopic Comment The carcinoma appears grade 2. Prognostic markers will be ordered. Dr. [name] has reviewed the case. The case was called to [place] on 01/11/2018.    01/10/2018 Receptors her2    The tumor cells are NEGATIVE for Her2 (1+). Estrogen Receptor: 100%, POSITIVE, STRONG STAINING INTENSITY Progesterone Receptor: 100%, POSITIVE, STRONG STAINING INTENSITY Proliferation Marker Ki67: 15%    01/10/2018 Cancer Staging    Staging form: Breast, AJCC 8th Edition - Clinical stage from 01/10/2018: Stage IA (cT1b, cN0, cM0, G2, ER+, PR+, HER2-) - Signed by [name], MD on 02/03/2018    01/30/2018 Initial Diagnosis    Breast cancer ([place])    05/07/2018 -  Anti-estrogen oral therapy     Anastrozole 70m daily starting 05/07/2018      CURRENT THERAPY:  -Anastrozole 143mdaily started2/23/2020. Due to rash, switched to Letrozole 2.65m40mn 06/2718.  -FOLFOXwith leucovorinq2weeks starting 06/12/18  INTERVAL HISTORY:  Kristin Williams here for a follow up and treatment. She presents to the clinic today by herself. She called her daughter to be included in the visit. She notes she is doing well. She notes she had a harder time after cycle 2 with nausea and fatigue and her taste change was stable. She did experience cold sensitivity that last 2 weeks. She was able to remain active but overall does less at home due to ambulating with walker. She will wash dishes, cook and do laundry. She saw Dr. VasMickeal Skinnero recommends she use cane more. She is still on 4mg67mxa daily. She still feels unstable with cane due to right sided weakness. She has been exercising her right side which has improved. She is still not able to climb stairs.  She feels her speech is slurring most of the time. She feels her memory is improved. She notes she corrected how she used Carafate which has improved her odynphagia and food is going down much better. She is now able to eat bread. She is eating just about any food she wants. Her weight is stable.  She notes mild constipation. She has been using Miralax once daily and Dulcolax as needed. She has been using suppositories  as needed.  She notes right nostril has been mildly bleeding, mostly a blood clot and then resolved.  She has started letrozole yesterday but her rash has progressed still. She denies any itching, new pain or cough.    REVIEW OF SYSTEMS:   Constitutional: Denies fevers, chills or abnormal weight loss Eyes: Denies blurriness of vision Ears, nose, mouth, throat, and face: Denies mucositis or sore throat (+) daily dried blood clot in right nostril.  Respiratory: Denies cough, dyspnea or wheezes Cardiovascular: Denies palpitation, chest discomfort  (+) Mild b/l lower extremity swelling Gastrointestinal:  Denies nausea, heartburn (+) Mild constipation  Skin: (+) Skin rash spread from breast to abdomen and arms, has progressed and more red.  Lymphatics: Denies new lymphadenopathy or easy bruising Neurological: (+) Shaking of hands, stable (+) improvement of right side weakness (+) balance mildly unsteady, ambulating with cane and walker (+) Slurred speech stable.  Behavioral/Psych: Mood is stable, no new changes  All other systems were reviewed with the patient and are negative.  MEDICAL HISTORY:  Past Medical History:  Diagnosis Date   Arthritis    Cancer ([place])    Cervical   Cancer ([place])    Breast   Cancer ([place])    Vaginal   Complication of anesthesia    COPD (chronic obstructive pulmonary disease) ([place])    Headache(784.0)    History of radiation therapy 02/28/19- 04/07/18   Esophagus, 1.8 Gy in 28 fractions for a total dose of 50.4 Gy.    History of radiation therapy 02/03/18- 02/08/18   Brain Rt Sup Cerebellum// 6 FFF photons.    Hyperlipidemia    Leg pain    Peripheral vascular disease ([place])    Personal history of chemotherapy    Personal history of radiation therapy    PONV (postoperative nausea and vomiting)     SURGICAL HISTORY: Past Surgical History:  Procedure Laterality Date   ABDOMINAL HYSTERECTOMY  1975   BREAST LUMPECTOMY  2005   GANGLION CYST EXCISION  1977   ILIAC ARTERY STENT  04/20/2004   CSD right iliac occlusive disease   IR IMAGING GUIDED PORT INSERTION  02/22/2018    I have reviewed the social history and family history with the patient and they are unchanged from previous note.  ALLERGIES:  is allergic to flonase [fluticasone propionate].  MEDICATIONS:  Current Outpatient Medications  Medication Sig Dispense Refill   acetaminophen (TYLENOL) 160 MG/5ML liquid Take 325 mg by mouth every 4 (four) hours as needed for fever.     albuterol (PROVENTIL HFA;VENTOLIN HFA) 108  (90 Base) MCG/ACT inhaler Inhale 2 puffs into the lungs every 6 (six) hours as needed for wheezing or shortness of breath.     dexamethasone (DECADRON) 4 MG tablet Take 14m daily in the AM 30 tablet 2   furosemide (LASIX) 20 MG tablet TAKE 1 TABLET BY MOUTH EVERY OTHER DAY 15 tablet 0   letrozole (FEMARA) 2.5 MG tablet Take 1 tablet (2.5 mg total) by mouth daily. 30 tablet 3   lidocaine (XYLOCAINE) 2 % solution MIX 1 PART 2% VISCOUS LIDOCAINE, 1 PART H20 SWALLOW 10ML OF DILUTED MIXTURE, 20 MIN BEFORE MEALS AND AT BEDTIME UP TO 3 TIMES DAILY 300 mL 1   lidocaine-prilocaine (EMLA) cream Apply 1 application topically as needed. 30 g 1   LORazepam (ATIVAN) 1 MG tablet Take 0.5 tablets (0.5 mg total) by mouth 3 (three) times daily as needed for anxiety or sleep. 45 tablet 0   magic mouthwash SOLN Take  5 mLs by mouth 4 (four) times daily. 60 mL 0   metoCLOPramide (REGLAN) 10 MG tablet Take 1 tablet (10 mg total) by mouth every 6 (six) hours as needed. for nausea 60 tablet 1   omeprazole (PRILOSEC) 40 MG capsule Take 40 mg by mouth daily.     ondansetron (ZOFRAN) 8 MG tablet Take 1 tablet (8 mg total) by mouth every 8 (eight) hours as needed for nausea or vomiting. 20 tablet 0   POTASSIUM CHLORIDE PO Take 20 mEq by mouth daily.      sucralfate (CARAFATE) 1 g tablet DISSOLVE ONE TABLET IN 10 MLS OF WATER AND SWALLOW UP TO FOUR TIMES DAILY TO SOOTHE THROAT 40 tablet 2   traZODone (DESYREL) 50 MG tablet Take 1 tablet (50 mg total) by mouth at bedtime. 30 tablet 1   warfarin (COUMADIN) 5 MG tablet Take 1 tablet (5 mg total) by mouth daily. 30 tablet 3   No current facility-administered medications for this visit.     PHYSICAL EXAMINATION: ECOG PERFORMANCE STATUS: 2 - Symptomatic, <50% confined to bed  Vitals:   07/10/18 1031  BP: 140/78  Pulse: 83  Resp: 20  Temp: (!) 97.5 F (36.4 C)  SpO2: 98%   Filed Weights   07/10/18 1031  Weight: 190 lb 9.6 oz (86.5 kg)    GENERAL:alert,  no distress and comfortable SKIN: skin texture, turgor are normal, no significant lesions (+) Skin rash spread from breast to abdomen and arms, has progressed and more red.  EYES: normal, Conjunctiva are pink and non-injected, sclera clear OROPHARYNX:no exudate, no erythema and lips, buccal mucosa, and tongue normal  NECK: supple, thyroid normal size, non-tender, without nodularity LYMPH:  no palpable lymphadenopathy in the cervical, axillary or inguinal LUNGS: clear to auscultation and percussion with normal breathing effort HEART: regular rate & rhythm and no murmurs and no lower extremity edema ABDOMEN:abdomen soft, non-tender and normal bowel sounds Musculoskeletal:no cyanosis of digits and no clubbing  NEURO: alert & oriented x 3 with fluent speech, no focal motor/sensory deficits  LABORATORY DATA:  I have reviewed the data as listed CBC Latest Ref Rng & Units 07/10/2018 06/26/2018 06/12/2018  WBC 4.0 - 10.5 K/uL 5.5 6.4 12.0(H)  Hemoglobin 12.0 - 15.0 g/dL 11.8(L) 11.5(L) 12.6  Hematocrit 36.0 - 46.0 % 36.3 35.5(L) 38.6  Platelets 150 - 400 K/uL 130(L) 161 215     CMP Latest Ref Rng & Units 07/10/2018 06/26/2018 06/12/2018  Glucose 70 - 99 mg/dL 106(H) 96 114(H)  BUN 8 - 23 mg/dL '13 17 12  ' Creatinine 0.44 - 1.00 mg/dL 0.81 0.90 0.83  Sodium 135 - 145 mmol/L 140 138 137  Potassium 3.5 - 5.1 mmol/L 3.8 3.7 3.5  Chloride 98 - 111 mmol/L 105 105 104  CO2 22 - 32 mmol/L '25 24 23  ' Calcium 8.9 - 10.3 mg/dL 9.0 8.6(L) 8.9  Total Protein 6.5 - 8.1 g/dL 6.0(L) 5.8(L) 6.3(L)  Total Bilirubin 0.3 - 1.2 mg/dL 0.3 0.3 0.3  Alkaline Phos 38 - 126 U/L 99 85 101  AST 15 - 41 U/L '17 15 15  ' ALT 0 - 44 U/L '21 18 22      ' RADIOGRAPHIC STUDIES: I have personally reviewed the radiological images as listed and agreed with the findings in the report. No results found.   ASSESSMENT & PLAN:  Kristin Williams is a 73 y.o. female with   1.EsophagealAdenocarcinoma,with oligo met incerebellum, stage  IV, HER2(-), MSS, and PD-L1(-) -She was diagnosed with  esophogeal cancerin 01/2018,with oligometastasisto the brain. Given her stage IV disease her cancer is not curable but still treatable. Goal of care it to control her disease.  -She completedSBRTto her brain meton11/27/2019and completed chemoRTto primary tumoron 04/07/18.She recovered well. -Her molecular testing on the tumor showed MSI stable disease, PD-L1 negative, she is not a candidate for immunotherapy. Her2was also negative,not a candidate for EGFR inhibitor. -She started consolidation chemotherapytreatment with FOLFOX q2weeksfor 3 monthson 06/12/18 tobettercontrol her disease. Tolerating moderately well with mild cold sensitivity, nausea, mild epistaxis, mild constipation and fatigue.  -given COVID-19 will post pone another scan until after 3 months chemo treatment. Will scan sooner if there are concerning symptoms.  -Labs reviewed, CBC WNL except Hg 11.8, PLT 130K, CMP WNL. Overall adequate to proceed with FOLFOXand leucovorin today. Due to her mild thrombocytopenia, will remain oxaliplatin at 62m/m2  -F/u in 2 weeks before cycle 4 -plan to repeat scan after she completes chemo   2. Dysphagia andodynophagia, nausea -secondary toher esophageal cancer and chemoradiation. -she is recovering well overall.She hasnot needed pain medicationlately -Continue Lidocaine and Sulcrafate with eating. I encouraged her to not take Lidocaine more than 3 times a day to prevent over exposure to heart.  -She will continue Reglan, ativan and Zofran as needed.  -Odynophagiamuch improved lately. She is able to each bread and most foods. Weight remains stable. -Mild nausea after cycle 2 FOLFOX, will monitor. She is mildly constipated. Mostly controlled with Miralax daily, Doculax and occasional suppositories.  -I encouraged her to increase Miralax if needed and to minimize the use of suppositories.   3. RightBreastInvasive  Ductal Carcinoma,cT1bN0M0 stage IA,Grade II, ER/PR Positive, HER 2 negative -Diagnosed through screening mammogram, same time as her esophageal cancer in October 2019.  -Given her early stage breast cancer, I previously discussedher breast cancer treatment is less urgent than esophogeal cancertreatment. -Given her EP/PR breast cancer and post-menopausal status,she startedantiestrogen therapy with anastrozoleon 05/07/18, and tolerating wellexcept recent tiny bruising of her breasts and abdomen. Will monitor. -Her breasts surgery has been held due to her metastatic esophageal cancer. -She previously developed mild skin rash on her chest and abdomen after she started anastrozole, not symptomatic, will continue monitoring.  -Her rash has spread more on abdomen and now arms. I switched her anastrozole to letrozole on 07/10/18.  -Her rash progressed mildly with more redness even with Letrozole, will continue to monitor.   4.Oligo brain mets, Insomnia -This is likely from her primaryesophogealcancer.  -She does have balance issues and ambulates with assistance.  -She is s/p SBRT. Hermild slurred speech, right sided weaknessand balance stabilityhas muchimproved -Will monitor with brain scan every 3 months. -She had a brain MRI on 2/14/2020and shows decreases in brain met but has considerable increase in edema of cerebellum -Dr. VThreasa Alphaher dexa to 483mdaily. -Dexa exacerbated her insomnia even with ativan to 1.5 tabs. I previously called in Trazodone 5027mightly as needed (06/12/18)  -her speech continues to be slurred. She continues to exercise and gaining strength of right side. She continues to practice walking with cane although still unsteady, and with walker. She still has occasional shaking of her hands. Her memory is improving.  -Dr. VasMickeal Skinneruld like her to continue dexa 4mg105mily.    5. H/o Left breast cancer, ER/PR negative, treated by Dr. MagrJana Hakimh  lumpectomy, adj, chemo and radiation. -She was previously referred to genetics  -no evidence of recurrence   6.Acute deep vein thrombosis (DVT) of popliteal vein of left lower extremity,  diagnosed on 02/28/2018  -She was found to have left lower extremity edema on his first day of chemoradiation, Doppler showed DVT -She iscurrentlyon coumadin 18m daily except 2.513mon Wednesday. INR 2.8 today (07/10/18). Will continue same dose.   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  -Stable, did not worsen with start of FOLFOX   8.Goal of care discussion  -she is full code now -Patient understand her treatment is palliative to prolong her life,her cancer is not curable unfortunately.  9.COPD -Shehas chronic cough -She presented to ED for COPD excerebration on 04/12/18. She has recovered and her breathing is improved. -stable, has not had cough recently.    PLAN: -Labs reviewed and adequate to proceed with FOLFOX and Leucovorin today, will continue same dose as last cycle   -Continue Coumadin 64m4maily except 2.64mg68m Wednesday  -Lab, flush, f/u and treatment in 2 weeks -Continue Letrozole daily    No problem-specific Assessment & Plan notes found for this encounter.   No orders of the defined types were placed in this encounter.  All questions were answered. The patient knows to call the clinic with any problems, questions or concerns. No barriers to learning was detected. I spent 20 minutes counseling the patient face to face. The total time spent in the appointment was 25 minutes and more than 50% was on counseling and review of test results     Kristin Williams 07/10/2018   I, AmoyJoslyn Devon acting as scribe for Shauntel Prest Truitt Williams.   I have reviewed the above documentation for accuracy and completeness, and I agree with the above.

## 2018-07-10 ENCOUNTER — Telehealth: Payer: Self-pay | Admitting: Hematology

## 2018-07-10 ENCOUNTER — Other Ambulatory Visit: Payer: Self-pay

## 2018-07-10 ENCOUNTER — Inpatient Hospital Stay: Payer: Medicare Other

## 2018-07-10 ENCOUNTER — Other Ambulatory Visit: Payer: Medicare Other

## 2018-07-10 ENCOUNTER — Inpatient Hospital Stay (HOSPITAL_BASED_OUTPATIENT_CLINIC_OR_DEPARTMENT_OTHER): Payer: Medicare Other | Admitting: Hematology

## 2018-07-10 ENCOUNTER — Encounter: Payer: Self-pay | Admitting: Hematology

## 2018-07-10 VITALS — BP 140/78 | HR 83 | Temp 97.5°F | Resp 20 | Ht 60.0 in | Wt 190.6 lb

## 2018-07-10 DIAGNOSIS — R531 Weakness: Secondary | ICD-10-CM | POA: Diagnosis not present

## 2018-07-10 DIAGNOSIS — R1310 Dysphagia, unspecified: Secondary | ICD-10-CM

## 2018-07-10 DIAGNOSIS — C159 Malignant neoplasm of esophagus, unspecified: Secondary | ICD-10-CM | POA: Diagnosis not present

## 2018-07-10 DIAGNOSIS — I82432 Acute embolism and thrombosis of left popliteal vein: Secondary | ICD-10-CM

## 2018-07-10 DIAGNOSIS — Z5111 Encounter for antineoplastic chemotherapy: Secondary | ICD-10-CM | POA: Diagnosis not present

## 2018-07-10 DIAGNOSIS — Z7189 Other specified counseling: Secondary | ICD-10-CM

## 2018-07-10 DIAGNOSIS — R4781 Slurred speech: Secondary | ICD-10-CM

## 2018-07-10 DIAGNOSIS — J449 Chronic obstructive pulmonary disease, unspecified: Secondary | ICD-10-CM

## 2018-07-10 DIAGNOSIS — C50911 Malignant neoplasm of unspecified site of right female breast: Secondary | ICD-10-CM

## 2018-07-10 DIAGNOSIS — K5900 Constipation, unspecified: Secondary | ICD-10-CM | POA: Diagnosis not present

## 2018-07-10 DIAGNOSIS — C7931 Secondary malignant neoplasm of brain: Secondary | ICD-10-CM

## 2018-07-10 DIAGNOSIS — Z452 Encounter for adjustment and management of vascular access device: Secondary | ICD-10-CM | POA: Diagnosis not present

## 2018-07-10 DIAGNOSIS — R21 Rash and other nonspecific skin eruption: Secondary | ICD-10-CM

## 2018-07-10 DIAGNOSIS — I824Z2 Acute embolism and thrombosis of unspecified deep veins of left distal lower extremity: Secondary | ICD-10-CM

## 2018-07-10 LAB — CBC WITH DIFFERENTIAL (CANCER CENTER ONLY)
Abs Immature Granulocytes: 0.08 10*3/uL — ABNORMAL HIGH (ref 0.00–0.07)
Basophils Absolute: 0 10*3/uL (ref 0.0–0.1)
Basophils Relative: 0 %
Eosinophils Absolute: 0 10*3/uL (ref 0.0–0.5)
Eosinophils Relative: 1 %
HCT: 36.3 % (ref 36.0–46.0)
Hemoglobin: 11.8 g/dL — ABNORMAL LOW (ref 12.0–15.0)
Immature Granulocytes: 2 %
Lymphocytes Relative: 4 %
Lymphs Abs: 0.2 10*3/uL — ABNORMAL LOW (ref 0.7–4.0)
MCH: 33.7 pg (ref 26.0–34.0)
MCHC: 32.5 g/dL (ref 30.0–36.0)
MCV: 103.7 fL — ABNORMAL HIGH (ref 80.0–100.0)
Monocytes Absolute: 0.5 10*3/uL (ref 0.1–1.0)
Monocytes Relative: 9 %
Neutro Abs: 4.7 10*3/uL (ref 1.7–7.7)
Neutrophils Relative %: 84 %
Platelet Count: 130 10*3/uL — ABNORMAL LOW (ref 150–400)
RBC: 3.5 MIL/uL — ABNORMAL LOW (ref 3.87–5.11)
RDW: 15.9 % — ABNORMAL HIGH (ref 11.5–15.5)
WBC Count: 5.5 10*3/uL (ref 4.0–10.5)
nRBC: 0.4 % — ABNORMAL HIGH (ref 0.0–0.2)

## 2018-07-10 LAB — CMP (CANCER CENTER ONLY)
ALT: 21 U/L (ref 0–44)
AST: 17 U/L (ref 15–41)
Albumin: 3.2 g/dL — ABNORMAL LOW (ref 3.5–5.0)
Alkaline Phosphatase: 99 U/L (ref 38–126)
Anion gap: 10 (ref 5–15)
BUN: 13 mg/dL (ref 8–23)
CO2: 25 mmol/L (ref 22–32)
Calcium: 9 mg/dL (ref 8.9–10.3)
Chloride: 105 mmol/L (ref 98–111)
Creatinine: 0.81 mg/dL (ref 0.44–1.00)
GFR, Est AFR Am: >60 mL/min (ref >60)
GFR, Estimated: >60 mL/min (ref >60)
Glucose, Bld: 106 mg/dL — ABNORMAL HIGH (ref 70–99)
Potassium: 3.8 mmol/L (ref 3.5–5.1)
Sodium: 140 mmol/L (ref 135–145)
Total Bilirubin: 0.3 mg/dL (ref 0.3–1.2)
Total Protein: 6 g/dL — ABNORMAL LOW (ref 6.5–8.1)

## 2018-07-10 LAB — PROTIME-INR
INR: 2.8 — ABNORMAL HIGH (ref 0.8–1.2)
Prothrombin Time: 28.8 seconds — ABNORMAL HIGH (ref 11.4–15.2)

## 2018-07-10 MED ORDER — DEXTROSE 5 % IV SOLN
Freq: Once | INTRAVENOUS | Status: AC
  Administered 2018-07-10: 11:00:00 via INTRAVENOUS
  Filled 2018-07-10: qty 250

## 2018-07-10 MED ORDER — SODIUM CHLORIDE 0.9 % IV SOLN
2400.0000 mg/m2 | INTRAVENOUS | Status: DC
  Administered 2018-07-10: 4600 mg via INTRAVENOUS
  Filled 2018-07-10: qty 92

## 2018-07-10 MED ORDER — OXALIPLATIN CHEMO INJECTION 100 MG/20ML
75.0000 mg/m2 | Freq: Once | INTRAVENOUS | Status: AC
  Administered 2018-07-10: 145 mg via INTRAVENOUS
  Filled 2018-07-10: qty 20

## 2018-07-10 MED ORDER — LEUCOVORIN CALCIUM INJECTION 350 MG
400.0000 mg/m2 | Freq: Once | INTRAVENOUS | Status: AC
  Administered 2018-07-10: 764 mg via INTRAVENOUS
  Filled 2018-07-10: qty 38.2

## 2018-07-10 MED ORDER — DEXAMETHASONE SODIUM PHOSPHATE 10 MG/ML IJ SOLN
10.0000 mg | Freq: Once | INTRAMUSCULAR | Status: AC
  Administered 2018-07-10: 10 mg via INTRAVENOUS

## 2018-07-10 MED ORDER — PALONOSETRON HCL INJECTION 0.25 MG/5ML
0.2500 mg | Freq: Once | INTRAVENOUS | Status: AC
  Administered 2018-07-10: 0.25 mg via INTRAVENOUS

## 2018-07-10 NOTE — Telephone Encounter (Signed)
No los per 4/27. °

## 2018-07-12 ENCOUNTER — Inpatient Hospital Stay: Payer: Medicare Other

## 2018-07-12 ENCOUNTER — Other Ambulatory Visit: Payer: Self-pay

## 2018-07-12 VITALS — BP 147/76 | HR 73 | Temp 98.6°F | Resp 18

## 2018-07-12 DIAGNOSIS — Z5111 Encounter for antineoplastic chemotherapy: Secondary | ICD-10-CM | POA: Diagnosis not present

## 2018-07-12 DIAGNOSIS — C7931 Secondary malignant neoplasm of brain: Secondary | ICD-10-CM | POA: Diagnosis not present

## 2018-07-12 DIAGNOSIS — Z95828 Presence of other vascular implants and grafts: Secondary | ICD-10-CM

## 2018-07-12 DIAGNOSIS — I82432 Acute embolism and thrombosis of left popliteal vein: Secondary | ICD-10-CM | POA: Diagnosis not present

## 2018-07-12 DIAGNOSIS — C159 Malignant neoplasm of esophagus, unspecified: Secondary | ICD-10-CM | POA: Diagnosis not present

## 2018-07-12 DIAGNOSIS — I824Z2 Acute embolism and thrombosis of unspecified deep veins of left distal lower extremity: Secondary | ICD-10-CM

## 2018-07-12 DIAGNOSIS — Z452 Encounter for adjustment and management of vascular access device: Secondary | ICD-10-CM | POA: Diagnosis not present

## 2018-07-12 DIAGNOSIS — C50911 Malignant neoplasm of unspecified site of right female breast: Secondary | ICD-10-CM | POA: Diagnosis not present

## 2018-07-12 MED ORDER — SODIUM CHLORIDE 0.9% FLUSH
10.0000 mL | Freq: Once | INTRAVENOUS | Status: AC
  Administered 2018-07-12: 10 mL
  Filled 2018-07-12: qty 10

## 2018-07-12 MED ORDER — HEPARIN SOD (PORK) LOCK FLUSH 100 UNIT/ML IV SOLN
500.0000 [IU] | Freq: Once | INTRAVENOUS | Status: AC
  Administered 2018-07-12: 12:00:00 500 [IU]
  Filled 2018-07-12: qty 5

## 2018-07-13 ENCOUNTER — Other Ambulatory Visit: Payer: Self-pay | Admitting: Hematology

## 2018-07-18 ENCOUNTER — Other Ambulatory Visit: Payer: Self-pay | Admitting: Hematology

## 2018-07-18 NOTE — Telephone Encounter (Signed)
Kristin Williams, please call pt to see if she is still taking lasix? If her leg edema improved, OK to stop and re-evaluate on next visit. OK to refill a 1-2 week course if she wants. Thanks   [name] MD

## 2018-07-18 NOTE — Telephone Encounter (Signed)
Ok to refill 

## 2018-07-19 ENCOUNTER — Telehealth: Payer: Self-pay | Admitting: *Deleted

## 2018-07-19 NOTE — Telephone Encounter (Signed)
[name] patient regarding her lasix refill request.  Pt states that cheryl, RN has already. Pt does not need the lasix refilled at this time.

## 2018-07-20 DIAGNOSIS — R7303 Prediabetes: Secondary | ICD-10-CM | POA: Diagnosis not present

## 2018-07-20 DIAGNOSIS — E7800 Pure hypercholesterolemia, unspecified: Secondary | ICD-10-CM | POA: Diagnosis not present

## 2018-07-20 DIAGNOSIS — C7931 Secondary malignant neoplasm of brain: Secondary | ICD-10-CM | POA: Diagnosis not present

## 2018-07-20 DIAGNOSIS — K219 Gastro-esophageal reflux disease without esophagitis: Secondary | ICD-10-CM | POA: Diagnosis not present

## 2018-07-20 DIAGNOSIS — C159 Malignant neoplasm of esophagus, unspecified: Secondary | ICD-10-CM | POA: Diagnosis not present

## 2018-07-24 ENCOUNTER — Inpatient Hospital Stay: Payer: Medicare Other

## 2018-07-24 ENCOUNTER — Inpatient Hospital Stay (HOSPITAL_BASED_OUTPATIENT_CLINIC_OR_DEPARTMENT_OTHER): Payer: Medicare Other | Admitting: Nurse Practitioner

## 2018-07-24 ENCOUNTER — Telehealth: Payer: Self-pay | Admitting: Nurse Practitioner

## 2018-07-24 ENCOUNTER — Inpatient Hospital Stay: Payer: Medicare Other | Attending: Hematology

## 2018-07-24 ENCOUNTER — Other Ambulatory Visit: Payer: Self-pay

## 2018-07-24 ENCOUNTER — Encounter: Payer: Self-pay | Admitting: Nurse Practitioner

## 2018-07-24 VITALS — BP 135/66 | HR 68 | Temp 98.0°F | Resp 18 | Ht 60.0 in | Wt 189.9 lb

## 2018-07-24 DIAGNOSIS — I82432 Acute embolism and thrombosis of left popliteal vein: Secondary | ICD-10-CM | POA: Insufficient documentation

## 2018-07-24 DIAGNOSIS — K5900 Constipation, unspecified: Secondary | ICD-10-CM | POA: Diagnosis not present

## 2018-07-24 DIAGNOSIS — J449 Chronic obstructive pulmonary disease, unspecified: Secondary | ICD-10-CM

## 2018-07-24 DIAGNOSIS — R4781 Slurred speech: Secondary | ICD-10-CM

## 2018-07-24 DIAGNOSIS — Z452 Encounter for adjustment and management of vascular access device: Secondary | ICD-10-CM | POA: Insufficient documentation

## 2018-07-24 DIAGNOSIS — I824Z2 Acute embolism and thrombosis of unspecified deep veins of left distal lower extremity: Secondary | ICD-10-CM

## 2018-07-24 DIAGNOSIS — Z5111 Encounter for antineoplastic chemotherapy: Secondary | ICD-10-CM | POA: Insufficient documentation

## 2018-07-24 DIAGNOSIS — G620 Drug-induced polyneuropathy: Secondary | ICD-10-CM

## 2018-07-24 DIAGNOSIS — R21 Rash and other nonspecific skin eruption: Secondary | ICD-10-CM

## 2018-07-24 DIAGNOSIS — C7931 Secondary malignant neoplasm of brain: Secondary | ICD-10-CM | POA: Diagnosis not present

## 2018-07-24 DIAGNOSIS — R110 Nausea: Secondary | ICD-10-CM | POA: Diagnosis not present

## 2018-07-24 DIAGNOSIS — Z853 Personal history of malignant neoplasm of breast: Secondary | ICD-10-CM

## 2018-07-24 DIAGNOSIS — M545 Low back pain: Secondary | ICD-10-CM | POA: Diagnosis not present

## 2018-07-24 DIAGNOSIS — R531 Weakness: Secondary | ICD-10-CM | POA: Diagnosis not present

## 2018-07-24 DIAGNOSIS — R197 Diarrhea, unspecified: Secondary | ICD-10-CM

## 2018-07-24 DIAGNOSIS — C159 Malignant neoplasm of esophagus, unspecified: Secondary | ICD-10-CM | POA: Insufficient documentation

## 2018-07-24 DIAGNOSIS — C50911 Malignant neoplasm of unspecified site of right female breast: Secondary | ICD-10-CM | POA: Diagnosis not present

## 2018-07-24 DIAGNOSIS — Z7189 Other specified counseling: Secondary | ICD-10-CM

## 2018-07-24 LAB — CBC WITH DIFFERENTIAL (CANCER CENTER ONLY)
Abs Immature Granulocytes: 0.08 10*3/uL — ABNORMAL HIGH (ref 0.00–0.07)
Basophils Absolute: 0 10*3/uL (ref 0.0–0.1)
Basophils Relative: 0 %
Eosinophils Absolute: 0 10*3/uL (ref 0.0–0.5)
Eosinophils Relative: 0 %
HCT: 34.8 % — ABNORMAL LOW (ref 36.0–46.0)
Hemoglobin: 10.9 g/dL — ABNORMAL LOW (ref 12.0–15.0)
Immature Granulocytes: 2 %
Lymphocytes Relative: 4 %
Lymphs Abs: 0.2 10*3/uL — ABNORMAL LOW (ref 0.7–4.0)
MCH: 32.7 pg (ref 26.0–34.0)
MCHC: 31.3 g/dL (ref 30.0–36.0)
MCV: 104.5 fL — ABNORMAL HIGH (ref 80.0–100.0)
Monocytes Absolute: 0.5 10*3/uL (ref 0.1–1.0)
Monocytes Relative: 12 %
Neutro Abs: 3.4 10*3/uL (ref 1.7–7.7)
Neutrophils Relative %: 82 %
Platelet Count: 111 10*3/uL — ABNORMAL LOW (ref 150–400)
RBC: 3.33 MIL/uL — ABNORMAL LOW (ref 3.87–5.11)
RDW: 16.5 % — ABNORMAL HIGH (ref 11.5–15.5)
WBC Count: 4.1 10*3/uL (ref 4.0–10.5)
nRBC: 1.2 % — ABNORMAL HIGH (ref 0.0–0.2)

## 2018-07-24 LAB — CMP (CANCER CENTER ONLY)
ALT: 22 U/L (ref 0–44)
AST: 15 U/L (ref 15–41)
Albumin: 3.2 g/dL — ABNORMAL LOW (ref 3.5–5.0)
Alkaline Phosphatase: 110 U/L (ref 38–126)
Anion gap: 10 (ref 5–15)
BUN: 18 mg/dL (ref 8–23)
CO2: 24 mmol/L (ref 22–32)
Calcium: 8.8 mg/dL — ABNORMAL LOW (ref 8.9–10.3)
Chloride: 107 mmol/L (ref 98–111)
Creatinine: 0.97 mg/dL (ref 0.44–1.00)
GFR, Est AFR Am: >60 mL/min (ref >60)
GFR, Estimated: 59 mL/min — ABNORMAL LOW (ref >60)
Glucose, Bld: 92 mg/dL (ref 70–99)
Potassium: 4.8 mmol/L (ref 3.5–5.1)
Sodium: 141 mmol/L (ref 135–145)
Total Bilirubin: 0.4 mg/dL (ref 0.3–1.2)
Total Protein: 6 g/dL — ABNORMAL LOW (ref 6.5–8.1)

## 2018-07-24 LAB — PROTIME-INR
INR: 3.6 — ABNORMAL HIGH (ref 0.8–1.2)
Prothrombin Time: 35.4 seconds — ABNORMAL HIGH (ref 11.4–15.2)

## 2018-07-24 MED ORDER — PALONOSETRON HCL INJECTION 0.25 MG/5ML
0.2500 mg | Freq: Once | INTRAVENOUS | Status: AC
  Administered 2018-07-24: 0.25 mg via INTRAVENOUS

## 2018-07-24 MED ORDER — SODIUM CHLORIDE 0.9 % IV SOLN
2400.0000 mg/m2 | INTRAVENOUS | Status: DC
  Administered 2018-07-24: 4600 mg via INTRAVENOUS
  Filled 2018-07-24: qty 92

## 2018-07-24 MED ORDER — SODIUM CHLORIDE 0.9 % IV SOLN
Freq: Once | INTRAVENOUS | Status: AC
  Administered 2018-07-24: 12:00:00 via INTRAVENOUS
  Filled 2018-07-24: qty 5

## 2018-07-24 MED ORDER — LEUCOVORIN CALCIUM INJECTION 350 MG
400.0000 mg/m2 | Freq: Once | INTRAVENOUS | Status: AC
  Administered 2018-07-24: 764 mg via INTRAVENOUS
  Filled 2018-07-24: qty 38.2

## 2018-07-24 MED ORDER — DEXTROSE 5 % IV SOLN
Freq: Once | INTRAVENOUS | Status: AC
  Administered 2018-07-24: 12:00:00 via INTRAVENOUS
  Filled 2018-07-24: qty 250

## 2018-07-24 MED ORDER — OXALIPLATIN CHEMO INJECTION 100 MG/20ML
75.0000 mg/m2 | Freq: Once | INTRAVENOUS | Status: AC
  Administered 2018-07-24: 145 mg via INTRAVENOUS
  Filled 2018-07-24: qty 10

## 2018-07-24 NOTE — Progress Notes (Signed)
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    01/30/2018 Initial Diagnosis    Metastasis to brain [facility])    01/31/2018 Imaging    MRI Brain W WO Contrast 01/31/18  IMPRESSION: 3 Tesla study does not disclose any additional lesions. Solitary metastasis in the right superior cerebellum measuring 2.9 x 2.6 x 3.0 cm with mild surrounding edema. 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    02/03/2018 - 02/08/2018 Radiation Therapy    SBRT with Dr. Isidore Moos on 02/03/18, 02/06/18, 02/08/18     Esophageal cancer, stage IV ([place])   01/08/2018 Imaging    CT AP W Constrast 01/08/18  IMPRESSION: 1. Nonobstructed, nondistended bowel. No acute inflammatory process. 2. Moderate-sized hiatal hernia noted. 3. Low-density 1.7 cm left adrenal nodule likely to represent a small adenoma. Lower pole left renal cyst measuring 1.8 cm. 4. Moderate aortic atherosclerosis. 5. Lumbar spondylosis and facet arthropathy.    01/27/2018 Imaging    CT Chest W Contrast 01/27/18  IMPRESSION: 1. Large mass involving the distal half of the esophagus spanning approximately 11.7 cm is identified compatible with primary esophageal neoplasm. 2. Small posterior  mediastinal and high right paratracheal lymph nodes are noted which may represent foci of metastatic adenopathy. 3. The small pulmonary nodules in the right lung are unchanged from 01/15/2016 and are favored to represent a benign process. 4. Aortic Atherosclerosis (ICD10-I70.0) and Emphysema (ICD10-J43.9). 5. Coronary artery atherosclerotic calcifications.    01/30/2018 Initial Diagnosis    Esophageal cancer, stage IV ([place])    02/03/2018 - 02/08/2018 Radiation Therapy    SBRT to oligo brain metastasis, in 3 fractions, under Dr. Isidore Moos     02/16/2018 PET scan    PET 02/16/18 IMPRESSION: 1. A 15.7 cm in length esophageal mass extends from the aortic arch level down to the distal esophagus, maximum SUV 22.4, compatible with malignancy. There is a small right lower neck level IV lymph node with lower than mediastinal blood pool activity, and a right upper paratracheal lymph node with just above mediastinal blood pool activity. 2. Faint speckled heterogeneity in the liver, for example in the lateral segment left hepatic lobe, is likely incidental/benign given the lack of correlate of CT finding. It might be prudent to obtain an MRI just to make sure that there is not a small early metastatic lesion in this vicinity. 3. Known right cerebellar vermian metastatic lesion, with expected hypo activity compared to the surrounding brain activity. 4. The right middle lobe pulmonary nodule measures 1.1 by 1.0 cm and is mildly hypermetabolic with maximum SUV 2.6. This lesion measured 6 by 5 mm back on 12/06/2003 and 1.2 by 1.1 cm on 10/15/2015. I am uncertain what to make of the very slow growth and mildly accentuated metabolic activity. This could  be a chronic granulomatous process. Low-grade adenocarcinoma of the lung seems unlikely to be so indolent as to only mildly increase over a 14 year period, but is presumably a differential diagnostic consideration. Surveillance is suggested. 5. Other  imaging findings of potential clinical significance: Aortic Atherosclerosis (ICD10-I70.0). Descending and sigmoid colon diverticulosis. Gas in the urinary bladder, most common cause would be recent catheterization. Minimal chronic left ethmoid sinusitis.    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 Ultrasound-guided biopsy is recommended.    01/10/2018 Initial Biopsy    Diagnsis from Breast Biopsy 01/10/18  Breast, right, needle core biopsy, 6 o'clock - INVASIVE DUCTAL CARCINOMA WITH CALCIFICATIONS, SEE COMMENT. - DUCTAL CARCINOMA IN SITU. 1 of 2 FINAL for MARRIE, CHANDRA [id number]) Microscopic Comment The carcinoma appears grade 2. Prognostic markers will be ordered. Dr. [name] has reviewed the case. The case was called to [place] on 01/11/2018.    01/10/2018 Receptors her2    The tumor cells are NEGATIVE for Her2 (1+). Estrogen Receptor: 100%, POSITIVE, STRONG STAINING INTENSITY Progesterone Receptor: 100%, POSITIVE, STRONG STAINING INTENSITY Proliferation Marker Ki67: 15%    01/10/2018 Cancer Staging    Staging form: Breast, AJCC 8th Edition - Clinical stage from 01/10/2018: Stage IA (cT1b, cN0, cM0, G2, ER+, PR+, HER2-) - Signed by [name], MD on 02/03/2018    01/30/2018 Initial Diagnosis    Breast cancer ([place])    05/07/2018 -  Anti-estrogen oral therapy    Anastrozole 96m daily starting 05/07/2018      CURRENT THERAPY: -Anastrozole 114mdaily started2/23/2020. Due to rash, switched to Letrozole 2.56m81mn 06/2718. -FOLFOXwith leucovorinq2weeks starting 06/12/18; oxaliplatin dose-reduced for thrombocytopenia    INTERVAL HISTORY: Ms. SmiBucknamturns for follow up and cycle 4 FOLFOX as scheduled. She had a "rough" time with last cycle with being tired, "cranky" and not feeling well. She had 2 good days. She is fatigued but able to function at home. Daughter helps her and delivers her groceries. She naps once per day for 30 minutes. After chemo she has constipation first then uses miralax which causes few episodes of diarrhea, this has been ongoing. Denies blood in stool. Does have mild blood in nasal sputum but denies large nosebleeds. She had delayed nausea intermittently for 2 weeks. Zofran does not help; takes reglan PRN. No vomiting. She has mild mouth sores for 2 days after chemo, resolves with mouthwash. Does not limit po intake. Eating and drinking moderately well. Swallowing without difficulty except with bread and pills occasionally. Rash on abdomen is somewhat clearing up, but has spread to left flank; non-itching. Cold sensitivity last 2 weeks, neuropathy to fingers is stable. She has right hand tremor. Slurred speech is slightly worse, per daughter who is on the phone. Weakness on right side is stable. Denies hot flashes. No bone/joint pain. Her cough is at baseline she attributes to COPD. Denies fever, chest pain, dyspnea, leg edema.    REVIEW OF SYSTEMS:  Constitutional: Denies fevers, chills or abnormal weight loss (+) fatigue  Eyes: Denies blurriness of vision Ears, nose, mouth, throat, and face: Denies sore throat. (+) blood-tinged nasal sputum (+) mucositis  Respiratory: Denies dyspnea or wheezes (+) cough at baseline  Cardiovascular: Denies palpitation, chest discomfort or lower extremity swelling Gastrointestinal:  Denies vomiting, GI bleeding, heartburn or change in bowel habits (+)  delayed intermittent nausea (+) constipation then diarrhea Skin: (+) skin rash to abdomen/trunk  Lymphatics: Denies new lymphadenopathy (+) easy bruising Neurological: (+) slurred speech (+) right side weakness (+) right hand tremor (+) neuropathy and cold sensitivity  Behavioral/Psych: Mood is stable, no new changes (+) "cranky" (+) insomnia  All other systems were reviewed with the patient and are negative.  MEDICAL HISTORY:  Past Medical History:  Diagnosis Date  . Arthritis   . Cancer (HCC)    Cervical  . Cancer ([place])    Breast  . Cancer ([place])    Vaginal  . Complication of anesthesia   . COPD (chronic obstructive pulmonary disease) ([place])   . Headache(784.0)   . History of radiation therapy 02/28/19- 04/07/18   Esophagus, 1.8 Gy in 28 fractions for a total dose of 50.4 Gy.   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   . Hyperlipidemia   . Leg pain   . Peripheral vascular disease ([place])   . Personal history of chemotherapy   . Personal history of radiation therapy   . PONV (postoperative nausea and vomiting)     SURGICAL HISTORY: Past Surgical History:  Procedure Laterality Date  . ABDOMINAL HYSTERECTOMY  1975  . BREAST LUMPECTOMY  2005  . GANGLION CYST EXCISION  1977  . ILIAC ARTERY STENT  04/20/2004   CSD right iliac occlusive disease  . IR IMAGING GUIDED PORT INSERTION  02/22/2018    I have reviewed the social history and family history with the patient and they are unchanged from previous note.  ALLERGIES:  is allergic to flonase [fluticasone propionate].  MEDICATIONS:  Current Outpatient Medications  Medication Sig Dispense Refill  . acetaminophen (TYLENOL) 160 MG/5ML liquid Take 325 mg by mouth every 4 (four) hours as needed for fever.    Marland Kitchen albuterol (PROVENTIL HFA;VENTOLIN HFA) 108 (90 Base) MCG/ACT inhaler Inhale 2 puffs into the lungs every 6 (six) hours as needed for wheezing or shortness of breath.    Marland Kitchen  atorvastatin (LIPITOR) 20 MG tablet Take 20 mg by mouth daily.    Marland Kitchen dexamethasone (DECADRON) 4 MG tablet Take 69m daily in the AM 30 tablet 2  . furosemide (LASIX) 20 MG tablet TAKE 1 TABLET BY MOUTH EVERY OTHER DAY 15 tablet 0  . letrozole (FEMARA) 2.5 MG tablet Take 1 tablet (2.5 mg total) by mouth daily. 30 tablet 3  . lidocaine (XYLOCAINE) 2 % solution MIX 1 PART 2% VISCOUS LIDOCAINE, 1 PART H20 SWALLOW 10ML OF DILUTED MIXTURE, 20 MIN BEFORE MEALS AND AT BEDTIME UP TO 3 TIMES DAILY 300 mL 1  . lidocaine-prilocaine (EMLA) cream Apply 1 application topically as needed. 30 g 1  . LORazepam (ATIVAN) 1 MG tablet Take 0.5 tablets (0.5 mg total) by mouth 3 (three) times daily as needed for anxiety or sleep. 45 tablet 0  . magic mouthwash SOLN Take 5 mLs by mouth 4 (four) times daily. 60 mL 0  . metoCLOPramide (REGLAN) 10 MG tablet Take 1 tablet (10 mg total) by mouth every 6 (six) hours as needed. for  nausea 60 tablet 1  . nystatin (MYCOSTATIN) 100000 UNIT/ML suspension TAKE 5ML BY MOUTH 4 TIMES DAILY 60 mL 0  . omeprazole (PRILOSEC) 40 MG capsule Take 40 mg by mouth daily.    Marland Kitchen POTASSIUM CHLORIDE PO Take 20 mEq by mouth daily.     . sucralfate (CARAFATE) 1 g tablet DISSOLVE ONE TABLET IN 10 MLS OF WATER AND SWALLOW UP TO FOUR TIMES DAILY TO SOOTHE THROAT 40 tablet 2  . traZODone (DESYREL) 50 MG tablet Take 1 tablet (50 mg total) by mouth at bedtime. 30 tablet 1  . warfarin (COUMADIN) 5 MG tablet Take 1 tablet (5 mg total) by mouth daily. 30 tablet 3  . ondansetron (ZOFRAN) 8 MG tablet Take 1 tablet (8 mg total) by mouth every 8 (eight) hours as needed for nausea or vomiting. 20 tablet 0   No current facility-administered medications for this visit.    Facility-Administered Medications Ordered in Other Visits  Medication Dose Route Frequency Provider Last Rate Last Dose  . fluorouracil (ADRUCIL) 4,600 mg in sodium chloride 0.9 % 58 mL chemo infusion  2,400 mg/m2 (Treatment Plan Recorded)  Intravenous 1 day or 1 dose [name], MD      . fosaprepitant (EMEND) 150 mg, dexamethasone (DECADRON) 12 mg in sodium chloride 0.9 % 145 mL IVPB   Intravenous Once [name], MD 454 mL/hr at 07/24/18 1228    . leucovorin 764 mg in dextrose 5 % 250 mL infusion  400 mg/m2 (Treatment Plan Recorded) Intravenous Once [name], MD      . oxaliplatin (ELOXATIN) 145 mg in dextrose 5 % 500 mL chemo infusion  75 mg/m2 (Treatment Plan Recorded) Intravenous Once [name], MD        PHYSICAL EXAMINATION: ECOG PERFORMANCE STATUS: 2 - Symptomatic, <50% confined to bed  Vitals:   07/24/18 1052  BP: 135/66  Pulse: 68  Resp: 18  Temp: 98 F (36.7 C)  SpO2: 99%   Filed Weights   07/24/18 1052  Weight: 189 lb 14.4 oz (86.1 kg)    GENERAL:alert, no distress and comfortable SKIN: skin color, texture, turgor are normal. Generalized bruising to arms bilaterally with scattered rash to abdomen  EYES:  sclera clear OROPHARYNX:no thrush or ulcers LYMPH:  no palpable cervical lymphadenopathy LUNGS: clear to auscultation with normal breathing effort HEART: regular rate & rhythm, no lower extremity edema ABDOMEN: abdomen soft, non-tender and normal bowel sounds NEURO: alert & oriented. Slurred speech noted. Mild right hand resting tremor. Mildly decreased peripheral vibratory sense to hands per tuning fork exam PAC without erythema   LABORATORY DATA:  I have reviewed the data as listed CBC Latest Ref Rng & Units 07/24/2018 07/10/2018 06/26/2018  WBC 4.0 - 10.5 K/uL 4.1 5.5 6.4  Hemoglobin 12.0 - 15.0 g/dL 10.9(L) 11.8(L) 11.5(L)  Hematocrit 36.0 - 46.0 % 34.8(L) 36.3 35.5(L)  Platelets 150 - 400 K/uL 111(L) 130(L) 161     CMP Latest Ref Rng & Units 07/24/2018 07/10/2018 06/26/2018  Glucose 70 - 99 mg/dL 92 106(H) 96  BUN 8 - 23 mg/dL '18 13 17  ' Creatinine 0.44 - 1.00 mg/dL 0.97 0.81 0.90  Sodium 135 - 145 mmol/L 141 140 138  Potassium 3.5 - 5.1 mmol/L 4.8 3.8 3.7  Chloride 98 - 111 mmol/L 107 105 105   CO2 22 - 32 mmol/L '24 25 24  ' Calcium 8.9 - 10.3 mg/dL 8.8(L) 9.0 8.6(L)  Total Protein 6.5 - 8.1 g/dL 6.0(L) 6.0(L) 5.8(L)  Total Bilirubin 0.3 -  1.2 mg/dL 0.4 0.3 0.3  Alkaline Phos 38 - 126 U/L 110 99 85  AST 15 - 41 U/L '15 17 15  ' ALT 0 - 44 U/L '22 21 18      ' RADIOGRAPHIC STUDIES: I have personally reviewed the radiological images as listed and agreed with the findings in the report. No results found.   ASSESSMENT & PLAN: BELANNA MANRING is a 73 y.o. female with   1.EsophagealAdenocarcinoma,with oligo met incerebellum, stage IV, HER2(-), MSS, and PD-L1(-) 2. Dysphagia andodynophagia, nausea - secondary toher esophageal cancer and chemoradiation.improved  3. RightBreastInvasive Ductal Carcinoma,cT1bN0M0 stage IA,Grade II, ER/PR Positive, HER 2 negative - surgery on hold for metastatic esophageal cancer. Started Anastrozole 04/2018, changed to letrozole 06/2018 due to rash  4.Oligo brain mets, Insomnia-This is likely from her primaryesophogealcancer. S/p SBRT; slurred speech, gait imbalance, and weakness improved; followed by Dr. Mickeal Skinner, on dex 4 mg daily.  5. H/o Left breast cancer, ER/PR negative, treated by Dr. Jana Hakim with lumpectomy, adj, chemo and radiation. -She was previously referred to genetics  6.Acute deep vein thrombosis (DVT) of popliteal vein of left lower extremity, diagnosed on 02/28/2018  7. 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 Slightly worse on FOLFOX but remains functional  8.Goal of care discussion -she is full code now; treatment goal is palliative  9.COPD, stable  She was diagnosed with esophogeal cancerin 01/2018,with oligometastasisto the brain, stage IV. She completed SBRT to her brain meton11/27/2019and completed chemoRTto primary tumoron 04/07/18.She recovered well.Her molecular testing on the tumor showed MSI stable disease, PD-L1 negative, she is not a candidate for immunotherapy. Her2was also  negative.not a candidate for EGFR inhibitor. She startedconsolidation chemotherapytreatment with FOLFOX q2weeksfor 3 monthson 3/30/20for disease control. Tolerating moderately well with mild cold sensitivity, nausea, mild epistaxis, mild constipation and fatigue. Given COVID-19 plan to restage after 3 months of chemo.  Ms. Hohman appears stable today . She completed 3 cycles of FOLFOX with dose-reduced oxaliplatin. She continues to tolerate moderately well, however her fatigue, cold sensitivity, and nausea have increased. She has delayed nausea. Will add emend from cycle 4, I encouraged her to use reglan before meals and PRN. I encouraged her to remain active and functional at home, limit naps to less than an hour, and eat and drink well to help with her fatigue.   Her rash has improved on the abdomen but spread slightly to her left side. Overall stable. She will continue letrozole daily. She is tolerating well otherwise, no hot flash or joint pain.   She feels her speech is slightly more slurred. She is on dex 4 mg daily. Next brain MRI in 08/2018 with f/u with Dr. Mickeal Skinner after. Will continue monitoring. If this worsens, she has decline in function, or new neuro changes we could move up scan.   VSS and weight stable. Labs reviewed. PLT 111K, otherwise CBC and CMP stable. INR 3.6 today, on coumadin 5 mg daily except Wednesday 2.5 mg. She recently started lipitor last week, though this should not affect coumadin. No diet changes. No missed or double doses. I recommend to hold today's dose, then resume 5 mg daily except 2.5 mg on Wednesday and Saturday. Will repeat in 2 weeks. She agrees. I wrote down these instructions for her today. Proceed with cycle 4 FOLFOX at same dose, add emend. F/u in 2 weeks with next cycle.   PLAN: -labs reviewed, proceed with cycle 4 FOLFOX at same dose -add Emend from cycle 4 -coumadin: hold dose today. Then take  5 mg daily except 2.5 mg daily on Wednesday and Saturday  -continue letrozole daily  -return in 2 weeks for lab, f/u, and cycle 5  All questions were answered. The patient knows to call the clinic with any problems, questions or concerns. No barriers to learning was detected.    Alla Feeling, NP 07/24/18

## 2018-07-24 NOTE — Telephone Encounter (Signed)
No los per 5/11.

## 2018-07-26 ENCOUNTER — Inpatient Hospital Stay: Payer: Medicare Other

## 2018-07-26 ENCOUNTER — Other Ambulatory Visit: Payer: Self-pay

## 2018-07-26 VITALS — BP 145/78 | HR 76 | Temp 98.1°F | Resp 18

## 2018-07-26 DIAGNOSIS — I82432 Acute embolism and thrombosis of left popliteal vein: Secondary | ICD-10-CM | POA: Diagnosis not present

## 2018-07-26 DIAGNOSIS — M545 Low back pain: Secondary | ICD-10-CM | POA: Diagnosis not present

## 2018-07-26 DIAGNOSIS — C159 Malignant neoplasm of esophagus, unspecified: Secondary | ICD-10-CM

## 2018-07-26 DIAGNOSIS — C50911 Malignant neoplasm of unspecified site of right female breast: Secondary | ICD-10-CM | POA: Diagnosis not present

## 2018-07-26 DIAGNOSIS — C7931 Secondary malignant neoplasm of brain: Secondary | ICD-10-CM | POA: Diagnosis not present

## 2018-07-26 DIAGNOSIS — Z5111 Encounter for antineoplastic chemotherapy: Secondary | ICD-10-CM | POA: Diagnosis not present

## 2018-07-26 MED ORDER — SODIUM CHLORIDE 0.9% FLUSH
10.0000 mL | INTRAVENOUS | Status: DC | PRN
  Administered 2018-07-26: 10 mL
  Filled 2018-07-26: qty 10

## 2018-07-26 MED ORDER — HEPARIN SOD (PORK) LOCK FLUSH 100 UNIT/ML IV SOLN
500.0000 [IU] | Freq: Once | INTRAVENOUS | Status: AC | PRN
  Administered 2018-07-26: 500 [IU]
  Filled 2018-07-26: qty 5

## 2018-08-01 DIAGNOSIS — H04123 Dry eye syndrome of bilateral lacrimal glands: Secondary | ICD-10-CM | POA: Diagnosis not present

## 2018-08-01 DIAGNOSIS — D3131 Benign neoplasm of right choroid: Secondary | ICD-10-CM | POA: Diagnosis not present

## 2018-08-01 DIAGNOSIS — Z961 Presence of intraocular lens: Secondary | ICD-10-CM | POA: Diagnosis not present

## 2018-08-01 DIAGNOSIS — H35371 Puckering of macula, right eye: Secondary | ICD-10-CM | POA: Diagnosis not present

## 2018-08-03 ENCOUNTER — Other Ambulatory Visit: Payer: Self-pay | Admitting: Hematology

## 2018-08-03 ENCOUNTER — Other Ambulatory Visit: Payer: Self-pay | Admitting: Nurse Practitioner

## 2018-08-03 MED ORDER — LORAZEPAM 1 MG PO TABS: 0.5000 mg | ORAL_TABLET | Freq: Three times a day (TID) | ORAL | 0 refills | Status: DC | PRN

## 2018-08-03 MED ORDER — NYSTATIN 100000 UNIT/ML MT SUSP: OROMUCOSAL | 0 refills | Status: DC

## 2018-08-03 MED ORDER — TRAZODONE HCL 50 MG PO TABS: 50.0000 mg | ORAL_TABLET | Freq: Every day | ORAL | 1 refills | Status: AC

## 2018-08-04 ENCOUNTER — Telehealth: Payer: Self-pay

## 2018-08-04 NOTE — Telephone Encounter (Signed)
Patient's daughter Joelene Millin calls back saying that they are holding off on coming in, will alternate heat and cold and take pain medication.  If pain is worse over the weekend they will go to the ED.

## 2018-08-04 NOTE — Telephone Encounter (Signed)
Patient's daughter calls stating patient has onset of back pain since yesterday in the flank area or a little below, denies dysuria, no hematuria, has been alternating heat and ice.  She wants to know if Dr. Burr Medico has any other suggestions.  She has pain medication but is reluctant to take because it causes constipation.    [phone]   Message placed on Dr. Ernestina Penna desk for review.

## 2018-08-08 ENCOUNTER — Inpatient Hospital Stay: Payer: Medicare Other

## 2018-08-08 ENCOUNTER — Ambulatory Visit: Payer: Medicare Other | Admitting: Internal Medicine

## 2018-08-08 ENCOUNTER — Encounter: Payer: Self-pay | Admitting: Nurse Practitioner

## 2018-08-08 ENCOUNTER — Other Ambulatory Visit: Payer: Self-pay

## 2018-08-08 ENCOUNTER — Inpatient Hospital Stay (HOSPITAL_BASED_OUTPATIENT_CLINIC_OR_DEPARTMENT_OTHER): Payer: Medicare Other | Admitting: Nurse Practitioner

## 2018-08-08 VITALS — BP 150/87 | HR 70 | Temp 98.2°F | Resp 20 | Ht 60.0 in | Wt 191.9 lb

## 2018-08-08 DIAGNOSIS — C50111 Malignant neoplasm of central portion of right female breast: Secondary | ICD-10-CM

## 2018-08-08 DIAGNOSIS — R05 Cough: Secondary | ICD-10-CM | POA: Diagnosis not present

## 2018-08-08 DIAGNOSIS — R21 Rash and other nonspecific skin eruption: Secondary | ICD-10-CM

## 2018-08-08 DIAGNOSIS — C159 Malignant neoplasm of esophagus, unspecified: Secondary | ICD-10-CM | POA: Diagnosis not present

## 2018-08-08 DIAGNOSIS — K5903 Drug induced constipation: Secondary | ICD-10-CM | POA: Diagnosis not present

## 2018-08-08 DIAGNOSIS — G620 Drug-induced polyneuropathy: Secondary | ICD-10-CM

## 2018-08-08 DIAGNOSIS — D61818 Other pancytopenia: Secondary | ICD-10-CM

## 2018-08-08 DIAGNOSIS — J449 Chronic obstructive pulmonary disease, unspecified: Secondary | ICD-10-CM | POA: Diagnosis not present

## 2018-08-08 DIAGNOSIS — R600 Localized edema: Secondary | ICD-10-CM | POA: Diagnosis not present

## 2018-08-08 DIAGNOSIS — M545 Low back pain, unspecified: Secondary | ICD-10-CM

## 2018-08-08 DIAGNOSIS — C7931 Secondary malignant neoplasm of brain: Secondary | ICD-10-CM

## 2018-08-08 DIAGNOSIS — E119 Type 2 diabetes mellitus without complications: Secondary | ICD-10-CM | POA: Diagnosis not present

## 2018-08-08 DIAGNOSIS — I824Z2 Acute embolism and thrombosis of unspecified deep veins of left distal lower extremity: Secondary | ICD-10-CM

## 2018-08-08 DIAGNOSIS — Z7189 Other specified counseling: Secondary | ICD-10-CM

## 2018-08-08 DIAGNOSIS — I82432 Acute embolism and thrombosis of left popliteal vein: Secondary | ICD-10-CM

## 2018-08-08 DIAGNOSIS — Z5111 Encounter for antineoplastic chemotherapy: Secondary | ICD-10-CM | POA: Diagnosis not present

## 2018-08-08 DIAGNOSIS — C50911 Malignant neoplasm of unspecified site of right female breast: Secondary | ICD-10-CM

## 2018-08-08 LAB — CBC WITH DIFFERENTIAL (CANCER CENTER ONLY)
Abs Immature Granulocytes: 0.19 10*3/uL — ABNORMAL HIGH (ref 0.00–0.07)
Basophils Absolute: 0 10*3/uL (ref 0.0–0.1)
Basophils Relative: 1 %
Eosinophils Absolute: 0 10*3/uL (ref 0.0–0.5)
Eosinophils Relative: 1 %
HCT: 31.5 % — ABNORMAL LOW (ref 36.0–46.0)
Hemoglobin: 10.1 g/dL — ABNORMAL LOW (ref 12.0–15.0)
Immature Granulocytes: 9 %
Lymphocytes Relative: 8 %
Lymphs Abs: 0.2 10*3/uL — ABNORMAL LOW (ref 0.7–4.0)
MCH: 32.8 pg (ref 26.0–34.0)
MCHC: 32.1 g/dL (ref 30.0–36.0)
MCV: 102.3 fL — ABNORMAL HIGH (ref 80.0–100.0)
Monocytes Absolute: 0.4 10*3/uL (ref 0.1–1.0)
Monocytes Relative: 17 %
Neutro Abs: 1.3 10*3/uL — ABNORMAL LOW (ref 1.7–7.7)
Neutrophils Relative %: 64 %
Platelet Count: 98 10*3/uL — ABNORMAL LOW (ref 150–400)
RBC: 3.08 MIL/uL — ABNORMAL LOW (ref 3.87–5.11)
RDW: 17.4 % — ABNORMAL HIGH (ref 11.5–15.5)
WBC Count: 2.1 10*3/uL — ABNORMAL LOW (ref 4.0–10.5)
nRBC: 6.8 % — ABNORMAL HIGH (ref 0.0–0.2)

## 2018-08-08 LAB — CMP (CANCER CENTER ONLY)
ALT: 30 U/L (ref 0–44)
AST: 15 U/L (ref 15–41)
Albumin: 3 g/dL — ABNORMAL LOW (ref 3.5–5.0)
Alkaline Phosphatase: 115 U/L (ref 38–126)
Anion gap: 8 (ref 5–15)
BUN: 18 mg/dL (ref 8–23)
CO2: 27 mmol/L (ref 22–32)
Calcium: 8.9 mg/dL (ref 8.9–10.3)
Chloride: 104 mmol/L (ref 98–111)
Creatinine: 0.79 mg/dL (ref 0.44–1.00)
GFR, Est AFR Am: >60 mL/min (ref >60)
GFR, Estimated: >60 mL/min (ref >60)
Glucose, Bld: 87 mg/dL (ref 70–99)
Potassium: 4 mmol/L (ref 3.5–5.1)
Sodium: 139 mmol/L (ref 135–145)
Total Bilirubin: 0.4 mg/dL (ref 0.3–1.2)
Total Protein: 5.9 g/dL — ABNORMAL LOW (ref 6.5–8.1)

## 2018-08-08 LAB — PROTIME-INR
INR: 3.4 — ABNORMAL HIGH (ref 0.8–1.2)
Prothrombin Time: 33.6 seconds — ABNORMAL HIGH (ref 11.4–15.2)

## 2018-08-08 MED ORDER — CYCLOBENZAPRINE HCL 5 MG PO TABS: 5.0000 mg | ORAL_TABLET | Freq: Three times a day (TID) | ORAL | 0 refills | Status: DC | PRN

## 2018-08-08 MED ORDER — OXYCODONE-ACETAMINOPHEN 5-325 MG PO TABS
1.0000 | ORAL_TABLET | Freq: Once | ORAL | Status: AC
  Administered 2018-08-08: 1 via ORAL

## 2018-08-08 NOTE — Progress Notes (Addendum)
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)   01/27/2018 Imaging    MRI Brain W WO Contrast 01/27/18  IMPRESSION: 2.9 cm hemorrhagic cerebellar mass most consistent with solitary brain metastasis. 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    01/30/2018 Initial Diagnosis    Metastasis to brain [facility])    01/31/2018 Imaging    MRI Brain W WO Contrast 01/31/18  IMPRESSION: 3 Tesla study does not disclose any additional lesions. Solitary metastasis in the right superior cerebellum measuring 2.9 x 2.6 x 3.0 cm with mild surrounding edema. 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    02/03/2018 - 02/08/2018 Radiation Therapy    SBRT with Dr. Isidore Moos on 02/03/18, 02/06/18, 02/08/18     Esophageal cancer, stage IV ([place])   01/08/2018 Imaging    CT AP W Constrast 01/08/18  IMPRESSION: 1. Nonobstructed, nondistended bowel. No acute inflammatory process. 2. Moderate-sized hiatal hernia noted. 3. Low-density 1.7 cm left adrenal nodule likely to represent a small adenoma. Lower pole left renal cyst measuring 1.8 cm. 4. Moderate aortic atherosclerosis. 5. Lumbar spondylosis and facet arthropathy.    01/27/2018 Imaging    CT Chest W Contrast 01/27/18  IMPRESSION: 1. Large mass involving the distal half of the esophagus spanning approximately 11.7 cm is identified compatible with primary esophageal neoplasm. 2. Small posterior mediastinal  and high right paratracheal lymph nodes are noted which may represent foci of metastatic adenopathy. 3. The small pulmonary nodules in the right lung are unchanged from 01/15/2016 and are favored to represent a benign process. 4. Aortic Atherosclerosis (ICD10-I70.0) and Emphysema (ICD10-J43.9). 5. Coronary artery atherosclerotic calcifications.    01/30/2018 Initial Diagnosis    Esophageal cancer, stage IV ([place])    02/03/2018 - 02/08/2018 Radiation Therapy    SBRT to oligo brain metastasis, in 3 fractions, under Dr. Isidore Moos     02/16/2018 PET scan    PET 02/16/18 IMPRESSION: 1. A 15.7 cm in length esophageal mass extends from the aortic arch level down to the distal esophagus, maximum SUV 22.4, compatible with malignancy. There is a small right lower neck level IV lymph node with lower than mediastinal blood pool activity, and a right upper paratracheal lymph node with just above mediastinal blood pool activity. 2. Faint speckled heterogeneity in the liver, for example in the lateral segment left hepatic lobe, is likely incidental/benign given the lack of correlate of CT finding. It might be prudent to obtain an MRI just to make sure that there is not a small early metastatic lesion in this vicinity. 3. Known right cerebellar vermian metastatic lesion, with expected hypo activity compared to the surrounding brain activity. 4. The right middle lobe pulmonary nodule measures 1.1 by 1.0 cm and is mildly hypermetabolic with maximum SUV 2.6. This lesion measured 6 by 5 mm back on 12/06/2003 and 1.2 by 1.1 cm on 10/15/2015. I am uncertain what to make of the very slow growth and mildly accentuated metabolic activity. This could be a chronic  granulomatous process. Low-grade adenocarcinoma of the lung seems unlikely to be so indolent as to only mildly increase over a 14 year period, but is presumably a differential diagnostic consideration. Surveillance is suggested. 5. Other imaging  findings of potential clinical significance: Aortic Atherosclerosis (ICD10-I70.0). Descending and sigmoid colon diverticulosis. Gas in the urinary bladder, most common cause would be recent catheterization. Minimal chronic left ethmoid sinusitis.    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 Ultrasound-guided biopsy is recommended.    01/10/2018 Initial Biopsy    Diagnsis from Breast Biopsy 01/10/18  Breast, right, needle core biopsy, 6 o'clock - INVASIVE DUCTAL CARCINOMA WITH CALCIFICATIONS, SEE COMMENT. - DUCTAL CARCINOMA IN SITU. 1 of 2 FINAL for Kristin Williams, Kristin Williams [phone]) Microscopic Comment The carcinoma appears grade 2. Prognostic markers will be ordered. Dr. [name] has reviewed the case. The case was called to [place] on 01/11/2018.    01/10/2018 Receptors her2    The tumor cells are NEGATIVE for Her2 (1+). Estrogen Receptor: 100%, POSITIVE, STRONG STAINING INTENSITY Progesterone Receptor: 100%, POSITIVE, STRONG STAINING INTENSITY Proliferation Marker Ki67: 15%    01/10/2018 Cancer Staging    Staging form: Breast, AJCC 8th Edition - Clinical stage from 01/10/2018: Stage IA (cT1b, cN0, cM0, G2, ER+, PR+, HER2-) - Signed by [name], MD on 02/03/2018    01/30/2018 Initial Diagnosis    Breast cancer ([place])    05/07/2018 -  Anti-estrogen oral therapy    Anastrozole 71m daily starting 05/07/2018     CURRENT  THERAPY: -Anastrozole 159mdaily started2/23/2020. Due to rash, switched to Letrozole 2.81m34mn 06/2718. -FOLFOXwith leucovorinq2weeks starting 06/12/18; oxaliplatin dose-reduced for thrombocytopenia   INTERVAL HISTORY: Ms. SmiGordnerturns for follow up and treatment as scheduled. She completed cycle 4 FOLFOX on 07/24/18. She did well until 5 days ago on 5/21 when she developed acute severe low back pain. She denies fall, injury or strain. It went away after a few days then returned Sunday 5/24 now in mid mid/lower back and constant. She rates pain 10/10. Pain does not radiate. Nothing makes it better. "Everything" makes it worse especially talking, breathing and coughing. She requires help with ADLs. She denies headaches, weakness or bladder/bowel incontinence. Denies dysuria or hematuria. She had similar pain related to DJD but "not this bad." Takes norco q8 which helps some but pain does not resolve.   She has bilateral leg edema, has restarted taking lasix every other day. Skin rash is spreading over abdomen with bruising on arms and legs. On coumadin 5 mg daily and 2.5 Wed/Sat. Denies bleeding.   Otherwise, cough is at baseline. Denies chest pain or dyspnea. Denies fever or chills. Last BM 1 day ago, has mild constipation with chemo. Appetite is normal, denies dysphagia or odynophagia. Denies neurological changes.   MEDICAL HISTORY:  Past Medical History:  Diagnosis Date  . Arthritis   . Cancer (HCC)    Cervical  . [place] ([place]  Breast  .  Cancer ([place])    Vaginal  . Complication of anesthesia   . COPD (chronic obstructive pulmonary disease) ([place])   . Headache(784.0)   . History of radiation therapy 02/28/19- 04/07/18   Esophagus, 1.8 Gy in 28 fractions for a total dose of 50.4 Gy.   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   . Hyperlipidemia   . Leg pain   . Peripheral vascular disease ([place])   . Personal history of chemotherapy   .  Personal history of radiation therapy   . PONV (postoperative nausea and vomiting)     SURGICAL HISTORY: Past Surgical History:  Procedure Laterality Date  . ABDOMINAL HYSTERECTOMY  1975  . BREAST LUMPECTOMY  2005  . GANGLION CYST EXCISION  1977  . ILIAC ARTERY STENT  04/20/2004   CSD right iliac occlusive disease  . IR IMAGING GUIDED PORT INSERTION  02/22/2018    I have reviewed the social history and family history with the patient and they are unchanged from previous note.  ALLERGIES:  is allergic to flonase [fluticasone propionate].  MEDICATIONS:  Current Outpatient Medications  Medication Sig Dispense Refill  . acetaminophen (TYLENOL) 160 MG/5ML liquid Take 325 mg by mouth every 4 (four) hours as needed for fever.    Marland Kitchen albuterol (PROVENTIL HFA;VENTOLIN HFA) 108 (90 Base) MCG/ACT inhaler Inhale 2 puffs into the lungs every 6 (six) hours as needed for wheezing or shortness of breath.    Marland Kitchen atorvastatin (LIPITOR) 20 MG tablet Take 20 mg by mouth daily.    Marland Kitchen dexamethasone (DECADRON) 4 MG tablet Take 67m daily in the AM 30 tablet 2  . furosemide (LASIX) 20 MG tablet TAKE 1 TABLET BY MOUTH EVERY OTHER DAY 15 tablet 0  . HYDROCODONE-ACETAMINOPHEN PO Take 7.5 mg by mouth every 8 (eight) hours as needed.    .Marland Kitchenletrozole (FEMARA) 2.5 MG tablet Take 1 tablet (2.5 mg total) by mouth daily. 30 tablet 3  . lidocaine (XYLOCAINE) 2 % solution MIX 1 PART 2% VISCOUS LIDOCAINE, 1 PART H20 SWALLOW 10ML OF DILUTED MIXTURE, 20 MIN BEFORE MEALS AND AT BEDTIME UP TO 3 TIMES DAILY 300 mL 1  . lidocaine-prilocaine (EMLA) cream Apply 1 application topically as needed. 30 g 1  . LORazepam (ATIVAN) 1 MG tablet Take 0.5 tablets (0.5 mg total) by mouth 3 (three) times daily as needed for anxiety or sleep. 45 tablet 0  . magic mouthwash SOLN Take 5 mLs by mouth 4 (four) times daily. 60 mL 0  . metoCLOPramide (REGLAN) 10 MG tablet Take 1 tablet (10 mg total) by mouth every 6 (six) hours as needed. for nausea 60  tablet 1  . nystatin (MYCOSTATIN) 100000 UNIT/ML suspension TAKE 5ML BY MOUTH 4 TIMES DAILY 60 mL 0  . omeprazole (PRILOSEC) 40 MG capsule Take 40 mg by mouth daily.    . ondansetron (ZOFRAN) 8 MG tablet Take 1 tablet (8 mg total) by mouth every 8 (eight) hours as needed for nausea or vomiting. 20 tablet 0  . sucralfate (CARAFATE) 1 g tablet DISSOLVE ONE TABLET IN 10 MLS OF WATER AND SWALLOW UP TO FOUR TIMES DAILY TO SOOTHE THROAT 40 tablet 2  . traZODone (DESYREL) 50 MG tablet Take 1 tablet (50 mg total) by mouth at bedtime. 30 tablet 1  . warfarin (COUMADIN) 5 MG tablet Take 1 tablet (5 mg total) by mouth daily. 30 tablet 3  . cyclobenzaprine (FLEXERIL) 5 MG tablet Take 1 tablet (  5 mg total) by mouth 3 (three) times daily as needed for muscle spasms. 30 tablet 0  . POTASSIUM CHLORIDE PO Take 20 mEq by mouth daily.      No current facility-administered medications for this visit.     PHYSICAL EXAMINATION: ECOG PERFORMANCE STATUS: 3 - Symptomatic, >50% confined to bed  Vitals:   08/08/18 0915  BP: (!) 150/87  Pulse: 70  Resp: 20  Temp: 98.2 F (36.8 C)  SpO2: 97%   Filed Weights   08/08/18 0915  Weight: 191 lb 14.4 oz (87 kg)    GENERAL:alert, no distress. Appears uncomfortable  SKIN: petechial rash to abdomen and breasts. Generalized bruising to arms and legs  EYES: sclera clear LUNGS: respirations even and unlabored  HEART: mild pitting lower extremity edema  MSK: no spinal erythema or bruising. Focal thoracolumbar tenderness to palpation.  NEURO: alert & oriented x 3, slurred speech, sensation is intact. equal strength in lower extremities  PAC without erythema  Limited exam for COVID outbreak   LABORATORY DATA:  I have reviewed the data as listed CBC Latest Ref Rng & Units 08/08/2018 07/24/2018 07/10/2018  WBC 4.0 - 10.5 K/uL 2.1(L) 4.1 5.5  Hemoglobin 12.0 - 15.0 g/dL 10.1(L) 10.9(L) 11.8(L)  Hematocrit 36.0 - 46.0 % 31.5(L) 34.8(L) 36.3  Platelets 150 - 400 K/uL 98(L)  111(L) 130(L)     CMP Latest Ref Rng & Units 08/08/2018 07/24/2018 07/10/2018  Glucose 70 - 99 mg/dL 87 92 106(H)  BUN 8 - 23 mg/dL '18 18 13  ' Creatinine 0.44 - 1.00 mg/dL 0.79 0.97 0.81  Sodium 135 - 145 mmol/L 139 141 140  Potassium 3.5 - 5.1 mmol/L 4.0 4.8 3.8  Chloride 98 - 111 mmol/L 104 107 105  CO2 22 - 32 mmol/L '27 24 25  ' Calcium 8.9 - 10.3 mg/dL 8.9 8.8(L) 9.0  Total Protein 6.5 - 8.1 g/dL 5.9(L) 6.0(L) 6.0(L)  Total Bilirubin 0.3 - 1.2 mg/dL 0.4 0.4 0.3  Alkaline Phos 38 - 126 U/L 115 110 99  AST 15 - 41 U/L '15 15 17  ' ALT 0 - 44 U/L '30 22 21      ' RADIOGRAPHIC STUDIES: I have personally reviewed the radiological images as listed and agreed with the findings in the report. No results found.   ASSESSMENT & PLAN:  Kristin Talsma Smithis a 73 y.o.femalewith   1. Acute severe mid/low back pain  2.EsophagealAdenocarcinoma,with oligo met incerebellum, stage IV, HER2(-), MSS, and PD-L1(-) 3. Dysphagia andodynophagia, nausea - secondary toher esophageal cancer and chemoradiation. Resolved  4. RightBreastInvasive Ductal Carcinoma,cT1bN0M0 stage IA,Grade II, ER/PR Positive, HER 2 negative - surgery on hold for metastatic esophageal cancer. Started Anastrozole 04/2018, changed to letrozole 06/2018 due to rash  5.Oligo brain mets, Insomnia-This is likely from her primaryesophogealcancer. S/p SBRT; slurred speech, gait imbalance, and weakness improved; followed by Dr. Mickeal Skinner, on dex 4 mg daily.  6. H/o Left breast cancer, ER/PR negative, treated by Dr. Jana Hakim with lumpectomy, adj, chemo and radiation. -She was previously referred to genetics  7.Acute deep vein thrombosis (DVT) of popliteal vein of left lower extremity, diagnosed on 02/28/2018  8. 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 Slightly worse on FOLFOX but remains functional  9.Goal of care discussion -she is full code now; treatment goal is palliative  10.COPD, stable  Kristin Williams has  developed acute severe mid/low back pain, etiology is unclear. Appears to have muscular component as pain increases with talking/coughing and movement. She will begin flexeril TID PRN,  I sent Rx to her pharmacy. She uses norco q8 PRN, she can increase to q4-6 hours PRN. She will let us know when she needs a refill. Will obtain thoracic and lumbar MRI this week (stat read) to r/o bone metastasis. We reviewed s/sx to monitor such as progressive lower extremity weakness, numbness, or loss of bladder/bowel function which would suggest cord compression. She understands. If MRI is negative, will refer to ortho.   She has completed cycle 4 FOLFOX with dose-reduced oxaliplatin. She tolerated well overall. Her labs today show pancytopenia, Hgb 10.1, PLT 98K, ANC 1.3. INR 3.4. She has petechial rash to abdomen and breasts. She will change coumadin to 5 mg daily except 2.5 mg on M/W/Sat. CMP is stable. Alk phos is normal.   Will hold chemotherapy today due to severe pain. She was given 1 tab percocet in clinic. She has a driver waiting for her. The patient was seen with Dr. Burr Medico who recommended the plan and discussed with her daughter Maudie Mercury who was on the phone during today's visit. Continue letrozole. F/u pending MRI.   PLAN: -Thoracic and lumbar MRI w/wo contrast this week; If unable to lay flat, OK to do MRI wo contrast  -Rx: flexeril 5 mg TID PRN  -Percocet 1 tab x1 today in clinic for severe back pain -Can increase norco to 1 tab q4-6 hours PRN  -new coumadin Rx: 5 mg daily except 2.5 mg M/W/Sat -Hold chemotherapy today -F/u pending MRI   Orders Placed This Encounter  Procedures  . MR THORACIC SPINE W WO CONTRAST    If patient can lay flat please use contrast, if she cannot lay flat long enough OK to do wo contrast    Standing Status:   Future    Standing Expiration Date:   10/08/2019    Order Specific Question:   ** REASON FOR EXAM (FREE TEXT)    Answer:   acute back pain. breast cancer, esophagus  cancer    Order Specific Question:   GRA to provide read?    Answer:   Yes    Order Specific Question:   If indicated for the ordered procedure, I authorize the administration of contrast media per Radiology protocol    Answer:   Yes    Order Specific Question:   What is the patient's sedation requirement?    Answer:   No Sedation    Order Specific Question:   Use SRS Protocol?    Answer:   No    Order Specific Question:   Does the patient have a pacemaker or implanted devices?    Answer:   No    Order Specific Question:   Preferred imaging location?    Answer:   [facility] (table limit-350 lbs)    Order Specific Question:   Radiology Contrast Protocol - do NOT remove file path    Answer:   \\[email or web address]  . MR Lumbar Spine W Wo Contrast    If patient can lay flat please use contrast, if unable to lay flat OK to do wo contrast    Standing Status:   Future    Standing Expiration Date:   10/08/2019    Order Specific Question:   ** REASON FOR EXAM (FREE TEXT)    Answer:   acute back pain. esophagus cancer, breast cancer    Order Specific Question:   If indicated for the ordered procedure, I authorize the administration of contrast media per Radiology protocol    Answer:  Yes    Order Specific Question:   What is the patient's sedation requirement?    Answer:   No Sedation    Order Specific Question:   Does the patient have a pacemaker or implanted devices?    Answer:   No    Order Specific Question:   Use SRS Protocol?    Answer:   No    Order Specific Question:   Radiology Contrast Protocol - do NOT remove file path    Answer:   \\[email or web address]    Order Specific Question:   Preferred imaging location?    Answer:   [facility] (table limit-350 lbs)   All questions were answered. The patient knows to call the clinic with any problems, questions or concerns. No barriers to learning was detected.    Alla Feeling, NP 08/08/18   Addendum I have seen the patient, examined her. I agree with the assessment and and plan and have edited the notes.   Kristin Williams has developed debilitating back pain, will get MRI to rule out bone metastasis, no suspicion for cord compression at this point. She is till on dexa 24m daily.  I will hold chemotherapy today, we discussed pain management. Will see her after MRI.  YTruitt Merle 08/08/2018

## 2018-08-09 ENCOUNTER — Telehealth: Payer: Self-pay | Admitting: Nurse Practitioner

## 2018-08-09 ENCOUNTER — Telehealth: Payer: Self-pay

## 2018-08-09 ENCOUNTER — Other Ambulatory Visit: Payer: Self-pay

## 2018-08-09 NOTE — Telephone Encounter (Signed)
Patient's daughter Joelene Millin calls stating she is not getting any relief with the pain medication and muscle relaxer.  Awaiting approval for spine scan before it can be scheduled.  Spoke with Cira Rue NP instructed her she can increase the pain medication to every 6 hours and take one and one half of the Flexeril.  Observe for over sedation.  Also if pain gets unbearable instructed to take to [facility] ED.  She verbalized an understanding.

## 2018-08-09 NOTE — Telephone Encounter (Signed)
No los per 5/26.

## 2018-08-10 ENCOUNTER — Telehealth: Payer: Self-pay

## 2018-08-10 NOTE — Telephone Encounter (Signed)
Spoke with patient's daughter Joelene Millin with appointment for MRI of thoracic and lumbar spine. Scheduled for tomorrow 5/29 be at [facility] at 8:30 NPO 6 hours prior.  She verbalized an understanding.

## 2018-08-11 ENCOUNTER — Ambulatory Visit (HOSPITAL_COMMUNITY)
Admission: RE | Admit: 2018-08-11 | Discharge: 2018-08-11 | Disposition: A | Discharge status: Home / Self Care | Payer: Medicare Other | Source: Ambulatory Visit | Attending: Nurse Practitioner | Admitting: Nurse Practitioner

## 2018-08-11 ENCOUNTER — Other Ambulatory Visit: Payer: Self-pay | Admitting: Nurse Practitioner

## 2018-08-11 ENCOUNTER — Other Ambulatory Visit: Payer: Self-pay

## 2018-08-11 ENCOUNTER — Telehealth: Payer: Self-pay

## 2018-08-11 DIAGNOSIS — M545 Low back pain, unspecified: Secondary | ICD-10-CM

## 2018-08-11 DIAGNOSIS — S22060A Wedge compression fracture of T7-T8 vertebra, initial encounter for closed fracture: Secondary | ICD-10-CM | POA: Diagnosis not present

## 2018-08-11 DIAGNOSIS — S22080A Wedge compression fracture of T11-T12 vertebra, initial encounter for closed fracture: Secondary | ICD-10-CM | POA: Diagnosis not present

## 2018-08-11 MED ORDER — GADOBUTROL 1 MMOL/ML IV SOLN
10.0000 mL | Freq: Once | INTRAVENOUS | Status: AC | PRN
  Administered 2018-08-11: 11:00:00 8 mL via INTRAVENOUS

## 2018-08-11 NOTE — Telephone Encounter (Signed)
Spoke with patient's daughter regarding MRI results, per Dr. Burr Medico she has compression fractures at T3, T8, T11, spinal stenosis at L3-4, L4-5, patient needs to see an orthopedist.  They already are established with Dr. Durene Cal, explained to her I will fax over her MRI results to him.  They will call his office to get an appointment.

## 2018-08-14 ENCOUNTER — Other Ambulatory Visit: Payer: Self-pay

## 2018-08-14 ENCOUNTER — Telehealth: Payer: Self-pay | Admitting: Nurse Practitioner

## 2018-08-14 DIAGNOSIS — M545 Low back pain, unspecified: Secondary | ICD-10-CM

## 2018-08-14 DIAGNOSIS — C154 Malignant neoplasm of middle third of esophagus: Secondary | ICD-10-CM

## 2018-08-14 MED ORDER — HYDROCODONE-ACETAMINOPHEN 7.5-325 MG PO TABS: 1.0000 | ORAL_TABLET | Freq: Four times a day (QID) | ORAL | 0 refills | Status: DC | PRN

## 2018-08-14 MED ORDER — CYCLOBENZAPRINE HCL 5 MG PO TABS: 5.0000 mg | ORAL_TABLET | Freq: Three times a day (TID) | ORAL | 0 refills | Status: DC | PRN

## 2018-08-14 MED ORDER — SUCRALFATE 1 G PO TABS: ORAL_TABLET | ORAL | 2 refills | Status: DC

## 2018-08-14 NOTE — Telephone Encounter (Signed)
I called Ms. Theiss to follow up on her pain. She reports pain is better overall. She takes hydrocodone-acet at 8 am, then flexeril at 9 am and continues this q6 hours. She is managing well. I will refill prescriptions. She has appointment with Dr. Sherwood Gambler this Friday 08/18/18. She will return to our clinic 6/8. She appreciates the call.  Cira Rue, NP  08/14/18

## 2018-08-14 NOTE — Telephone Encounter (Signed)
Patient calls for refills on Hydrocodone and Cyclobenzaprine, to be sent into [facility] (on file).

## 2018-08-17 NOTE — Progress Notes (Signed)
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    01/30/2018 Initial Diagnosis    Metastasis to brain [facility])    01/31/2018 Imaging    MRI Brain W WO Contrast 01/31/18  IMPRESSION: 3 Tesla study does not disclose any additional lesions. Solitary metastasis in the right superior cerebellum measuring 2.9 x 2.6 x 3.0 cm with mild surrounding edema. 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    02/03/2018 - 02/08/2018 Radiation Therapy    SBRT with Dr. Isidore Moos on 02/03/18, 02/06/18, 02/08/18     Esophageal cancer, stage IV ([place])   01/08/2018 Imaging    CT AP W Constrast 01/08/18  IMPRESSION: 1. Nonobstructed, nondistended bowel. No acute inflammatory process. 2. Moderate-sized hiatal hernia noted. 3. Low-density 1.7 cm left adrenal nodule likely to represent a small adenoma. Lower pole left renal cyst measuring 1.8 cm. 4. Moderate aortic atherosclerosis. 5. Lumbar spondylosis and facet arthropathy.    01/27/2018 Imaging    CT Chest W Contrast 01/27/18  IMPRESSION: 1. Large mass involving the distal half of the esophagus spanning approximately 11.7 cm is identified compatible with primary esophageal  neoplasm. 2. Small posterior mediastinal and high right paratracheal lymph nodes are noted which may represent foci of metastatic adenopathy. 3. The small pulmonary nodules in the right lung are unchanged from 01/15/2016 and are favored to represent a benign process. 4. Aortic Atherosclerosis (ICD10-I70.0) and Emphysema (ICD10-J43.9). 5. Coronary artery atherosclerotic calcifications.    01/30/2018 Initial Diagnosis    Esophageal cancer, stage IV ([place])    02/03/2018 - 02/08/2018 Radiation Therapy    SBRT to oligo brain metastasis, in 3 fractions, under Dr. Isidore Moos     02/16/2018 PET scan    PET 02/16/18 IMPRESSION: 1. A 15.7 cm in length esophageal mass extends from the aortic arch level down to the distal esophagus, maximum SUV 22.4, compatible with malignancy. There is a small right lower neck level IV lymph node with lower than mediastinal blood pool activity, and a right upper paratracheal lymph node with just above mediastinal blood pool activity. 2. Faint speckled heterogeneity in the liver, for example in the lateral segment left hepatic lobe, is likely incidental/benign given the lack of correlate of CT finding. It might be prudent to obtain an MRI just to make sure that there is not a small early metastatic lesion in this vicinity. 3. Known right cerebellar vermian metastatic lesion, with expected hypo activity compared to the surrounding brain activity. 4. The right middle lobe pulmonary nodule measures 1.1 by 1.0 cm and is mildly hypermetabolic with maximum SUV 2.6. This lesion measured 6 by 5 mm back on 12/06/2003 and 1.2 by 1.1 cm on 10/15/2015. I am uncertain what to make of the very slow growth and mildly  accentuated metabolic activity. This could be a chronic granulomatous process. Low-grade adenocarcinoma of the lung seems unlikely to be so indolent as to only mildly increase over a 14 year period, but is presumably a differential diagnostic consideration.  Surveillance is suggested. 5. Other imaging findings of potential clinical significance: Aortic Atherosclerosis (ICD10-I70.0). Descending and sigmoid colon diverticulosis. Gas in the urinary bladder, most common cause would be recent catheterization. Minimal chronic left ethmoid sinusitis.    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 Ultrasound-guided biopsy is recommended.    01/10/2018 Initial Biopsy    Diagnsis from Breast Biopsy 01/10/18  Breast, right, needle core biopsy, 6 o'clock - INVASIVE DUCTAL CARCINOMA WITH CALCIFICATIONS, SEE COMMENT. - DUCTAL CARCINOMA IN SITU. 1 of 2 FINAL for LINDALEE, HUIZINGA [phone]) Microscopic Comment The carcinoma appears grade 2. Prognostic markers will be ordered. Dr. [name] has reviewed the case. The case was called to [place] on 01/11/2018.    01/10/2018 Receptors her2    The tumor cells are NEGATIVE for Her2 (1+). Estrogen Receptor: 100%, POSITIVE, STRONG STAINING INTENSITY Progesterone Receptor: 100%, POSITIVE, STRONG STAINING INTENSITY Proliferation Marker Ki67: 15%    01/10/2018 Cancer Staging    Staging form: Breast, AJCC 8th Edition - Clinical stage from 01/10/2018: Stage IA (cT1b, cN0, cM0, G2, ER+, PR+, HER2-) - Signed by [name], MD on 02/03/2018    01/30/2018 Initial Diagnosis    Breast cancer ([place])    05/07/2018 -  Anti-estrogen oral therapy    Anastrozole  '1mg'$  daily starting 05/07/2018      CURRENT THERAPY:  -Anastrozole '1mg'$  daily started2/23/2020. Due to rash, switched to Letrozole 2.'5mg'$  on 06/2718. -FOLFOXwith leucovorinq2weeks starting 06/12/18  INTERVAL HISTORY:  ZORI BENBROOK is here for a follow up and treatment. She presents to the clinic alone. She called her daughter to be included in visit.  She notes her back pain is exacerbated by her coughing and laying down improved her pain. She saw Dr Lucia Gaskins who feels her pain is related to rib fracture or spread of cancer. He does not think this is related to her Thoracic spine compression fracture. He suggested a PET scan. She feels her back pain is right upper back pain which is managed by Hydrocodone q6hours and Flexeril. It takes her longer to function, but this is manageable to her.  She still feels her food gets caught midway through chest and needs water to get it the rest of the way. Only occurs with certain food. She notes she is not eating as much and does not like nutritional supplement Ensure.  Her daughter feels her Petechia is improving. She denies nay bleeding, but has peripheral bruising. She only had to take Lasix once last week. She is taking '5mg'$  Coumadin and 2.'5mg'$  on MWS.  She feels like she is dying giving her health is not improving like she feels it should. Yesterday she was in a down mood. She used Ativan which helped.  REVIEW OF SYSTEMS:   Constitutional: Denies fevers, chills or abnormal weight loss Eyes: Denies blurriness of vision Ears, nose, mouth, throat, and face: Denies mucositis or sore throat Respiratory: Denies cough, dyspnea or wheezes Cardiovascular: Denies palpitation, chest discomfort or lower extremity swelling Gastrointestinal:  Denies nausea, heartburn or change in bowel habits (+) Mild obstruction in esophagus.  Skin: (+) Skin rash is improving.  MSK: (+) right upper back pain  Lymphatics: Denies new lymphadenopathy (+) easy bruising  Neurological:Denies numbness, tingling or new weaknesses (+) Losing control of her right hand  Behavioral/Psych: Mood is stable, no new changes (+) Depressed mood  All other systems were reviewed with the patient and are negative.  MEDICAL HISTORY:  Past Medical History:  Diagnosis Date  . Arthritis   . Cancer (HCC)    Cervical  . Cancer ([place])    Breast  . Cancer ([place])    Vaginal  . Complication of anesthesia   . COPD (chronic obstructive pulmonary disease) ([place])   . Headache(784.0)   . History of radiation therapy 02/28/19- 04/07/18   Esophagus, 1.8 Gy in 28 fractions for a total dose of 50.4 Gy.   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   . Hyperlipidemia   . Leg pain   . Peripheral vascular disease ([place])   . Personal history of chemotherapy   . Personal history of radiation therapy   . PONV (postoperative nausea and vomiting)     SURGICAL HISTORY: Past Surgical History:  Procedure Laterality Date  . ABDOMINAL HYSTERECTOMY  1975  . BREAST LUMPECTOMY  2005  . GANGLION CYST EXCISION  1977  . ILIAC ARTERY STENT  04/20/2004   CSD right iliac occlusive disease  . IR IMAGING GUIDED PORT INSERTION  02/22/2018    I have reviewed the social history and family history with the patient and they are unchanged from previous note.  ALLERGIES:  is allergic to flonase [fluticasone propionate].  MEDICATIONS:  Current Outpatient Medications  Medication Sig Dispense Refill  . acetaminophen (TYLENOL) 160 MG/5ML liquid Take 325 mg by mouth every 4 (four) hours as needed for fever.    Marland Kitchen albuterol (PROVENTIL HFA;VENTOLIN HFA) 108 (90 Base) MCG/ACT inhaler Inhale 2 puffs into the lungs every 6 (six) hours as needed for wheezing or shortness of breath.    . anastrozole (ARIMIDEX) 1 MG tablet Take 1 tablet by mouth once daily 90 tablet 0  . atorvastatin (LIPITOR) 20 MG tablet Take 20 mg by mouth daily.    . cyclobenzaprine (FLEXERIL) 5  MG tablet Take 1 tablet (5 mg total) by mouth 3 (three) times daily as needed for muscle spasms. 60 tablet 0  . dexamethasone (DECADRON) 4 MG tablet Take '4mg'$  daily in the AM 30 tablet 2  . furosemide (LASIX) 20 MG tablet TAKE 1 TABLET BY MOUTH EVERY OTHER DAY 15 tablet 0  . HYDROcodone-acetaminophen (NORCO) 7.5-325 MG tablet Take 1 tablet by mouth every 6 (six) hours as needed. 45 tablet 0  . letrozole (FEMARA) 2.5 MG tablet Take 1 tablet (2.5 mg total) by mouth daily. 30 tablet 3  . lidocaine (XYLOCAINE) 2 % solution MIX 1 PART 2% VISCOUS LIDOCAINE, 1 PART H20 SWALLOW 10ML OF DILUTED MIXTURE, 20 MIN BEFORE MEALS AND AT BEDTIME UP TO 3 TIMES DAILY 300 mL 1  . lidocaine-prilocaine (EMLA) cream Apply 1 application topically as needed. 30 g 1  . LORazepam (ATIVAN) 1 MG tablet Take 0.5 tablets (0.5  mg total) by mouth 3 (three) times daily as needed for anxiety or sleep. 45 tablet 0  . magic mouthwash SOLN Take 5 mLs by mouth 4 (four) times daily. 60 mL 0  . metoCLOPramide (REGLAN) 10 MG tablet Take 1 tablet (10 mg total) by mouth every 6 (six) hours as needed. for nausea 60 tablet 1  . nystatin (MYCOSTATIN) 100000 UNIT/ML suspension TAKE 5ML BY MOUTH 4 TIMES DAILY 60 mL 0  . omeprazole (PRILOSEC) 40 MG capsule Take 40 mg by mouth daily.    . ondansetron (ZOFRAN) 8 MG tablet Take 1 tablet (8 mg total) by mouth every 8 (eight) hours as needed for nausea or vomiting. 20 tablet 0  . POTASSIUM CHLORIDE PO Take 20 mEq by mouth daily.     . sucralfate (CARAFATE) 1 g tablet DISSOLVE ONE TABLET IN 10 MLS OF WATER AND SWALLOW UP TO FOUR TIMES DAILY TO SOOTHE THROAT 40 tablet 2  . traZODone (DESYREL) 50 MG tablet Take 1 tablet (50 mg total) by mouth at bedtime. 30 tablet 1  . warfarin (COUMADIN) 5 MG tablet Take 1 tablet (5 mg total) by mouth daily. 30 tablet 3   No current facility-administered medications for this visit.    Facility-Administered Medications Ordered in Other Visits  Medication Dose Route  Frequency Provider Last Rate Last Dose  . fluorouracil (ADRUCIL) 4,600 mg in sodium chloride 0.9 % 58 mL chemo infusion  2,400 mg/m2 (Treatment Plan Recorded) Intravenous 1 day or 1 dose [name], MD   4,600 mg at 08/21/18 1421  . heparin lock flush 100 unit/mL  500 Units Intracatheter Once PRN [name], MD      . sodium chloride flush (NS) 0.9 % injection 10 mL  10 mL Intracatheter PRN [name], MD   10 mL at 08/21/18 1414    PHYSICAL EXAMINATION: ECOG PERFORMANCE STATUS: 3 - Symptomatic, >50% confined to bed  Vitals:   08/21/18 0941  BP: (!) 157/76  Pulse: 82  Resp: 17  Temp: 98.5 F (36.9 C)  SpO2: 98%   Filed Weights   08/21/18 0941  Weight: 190 lb 6.4 oz (86.4 kg)    GENERAL:alert, no distress and comfortable SKIN: skin color, texture, turgor are normal (+) Mild skin erythema or rash above her right chest PAC EYES: normal, Conjunctiva are pink and non-injected, sclera clear  NECK: supple, thyroid normal size, non-tender, without nodularity LYMPH:  no palpable lymphadenopathy in the cervical, axillary  LUNGS: clear to auscultation and percussion with normal breathing effort HEART: regular rate & rhythm and no murmurs and no lower extremity edema ABDOMEN:abdomen soft, non-tender and normal bowel sounds Musculoskeletal:no cyanosis of digits and no clubbing  NEURO: alert & oriented x 3 with fluent speech, no focal motor/sensory deficits  LABORATORY DATA:  I have reviewed the data as listed CBC Latest Ref Rng & Units 08/21/2018 08/08/2018 07/24/2018  WBC 4.0 - 10.5 K/uL 8.6 2.1(L) 4.1  Hemoglobin 12.0 - 15.0 g/dL 10.2(L) 10.1(L) 10.9(L)  Hematocrit 36.0 - 46.0 % 32.1(L) 31.5(L) 34.8(L)  Platelets 150 - 400 K/uL 153 98(L) 111(L)     CMP Latest Ref Rng & Units 08/21/2018 08/08/2018 07/24/2018  Glucose 70 - 99 mg/dL 85 87 92  BUN 8 - 23 mg/dL '11 18 18  '$ Creatinine 0.44 - 1.00 mg/dL 0.95 0.79 0.97  Sodium 135 - 145 mmol/L 138 139 141  Potassium 3.5 - 5.1 mmol/L 3.7 4.0 4.8   Chloride 98 - 111 mmol/L 104 104 107  CO2  22 - 32 mmol/L '24 27 24  '$ Calcium 8.9 - 10.3 mg/dL 8.6(L) 8.9 8.8(L)  Total Protein 6.5 - 8.1 g/dL 5.7(L) 5.9(L) 6.0(L)  Total Bilirubin 0.3 - 1.2 mg/dL 0.5 0.4 0.4  Alkaline Phos 38 - 126 U/L 150(H) 115 110  AST 15 - 41 U/L 14(L) 15 15  ALT 0 - 44 U/L '18 30 22      '$ RADIOGRAPHIC STUDIES: I have personally reviewed the radiological images as listed and agreed with the findings in the report. No results found.   ASSESSMENT & PLAN:  Kristin Williams is a 73 y.o. female with   1.EsophagealAdenocarcinoma,with oligo met incerebellum, stage IV, HER2(-), MSS, and PD-L1(-) -She was diagnosed with esophogeal cancerin 01/2018,with oligometastasisto the brain. Given her stage IV disease her cancer is not curable but still treatable. Goal of care it to control her disease.  -She completedSBRTto her brain meton11/27/2019and completed chemoRTto primary tumoron 04/07/18.She recovered well. -Her molecular testing on the tumor showed MSI stable disease, PD-L1 negative, she is not a candidate for immunotherapy. Her2was also negative,not a candidate for EGFR inhibitor. -She startedconsolidation chemotherapytreatment with FOLFOX q2weeksfor 3 monthson 3/30/20tobettercontrol her disease. Tolerating moderately well with mild cold sensitivity, nausea, mild epistaxis, mild constipation and fatigue.  -Labs reviewed, CBC, CMP and Pt/INR WNL except Hg 10.2, Ca 8.6, protein 5.7, albumin 2.9, AST 14. Overall adequate to proceed with FOLFOXandleucovorin today.  -F/u in 2 weeks with PET scan   2. Acute severe mid/low back pain  -MRI Thoracic and lumbar spine showed compression fracture at T3, T8, and T11 appear benign. -She was seen by neurosurgeon Dr. Sherwood Gambler who suspect her pain is not mainly from her compression fractures  -Back pain manageable with Hydrocodone q6hours and flexeril.  -will do PET scan in 2 weeks to evaluate her cancer status and  source of her pain   3. Dysphagia andodynophagia, nausea -secondary toher esophageal cancer and chemoradiation. -she is recovering well overall.She hasnot needed pain medicationlately -Continue Lidocaine and Sulcrafate with eating. I encouraged her to not take Lidocaine more than 3 times a day to prevent over exposure to heart. -She will continue Reglan,ativanandZofranas needed.  -Odynophagiamuch improved lately as she continues to have Mild obstruction in esophagus. She is able to each bread and most foods. Weight remains stable. I encouraged her to use protein powder or ensure in food.  -She has runny stool, I encouraged her to reduce Miralax use.    4. RightBreastInvasive Ductal Carcinoma,cT1bN0M0 stage IA,Grade II, ER/PR Positive, HER 2 negative -Diagnosed through screening mammogram, same time as her esophageal cancer in October 2019.  -Given her early stage breast cancer, I previously discussedher breast cancer treatment is less urgent than esophogeal cancertreatment. -Given her EP/PR breast cancer and post-menopausal status,she startedantiestrogen therapy with anastrozoleon 05/07/18, and tolerating wellexcept recent tiny bruising of her breasts and abdomen. Will monitor. -Her breasts surgery has been held due to her metastatic esophageal cancer. -She previously developed mild skin rash on her chest and abdomen after she started anastrozole, not symptomatic, will continue monitoring. -Her rash has spread more on abdomen and now arms. I switched her anastrozole to letrozole on 07/10/18.  -Her rash is improving. Continue Letrozole   5.Oligo brain mets, Insomnia -This is likely from her primaryesophogealcancer.  -She does have balance issues and ambulates with assistance.  -She is s/p SBRT. Hermild slurred speech, right sided weaknessand balance stabilityhas muchimproved -Will monitor with brain scan every 3 months. -She had a brain MRI on  2/14/2020and shows  decreases in brain met but has considerable increase in edema of cerebellum -Dexa exacerbated her insomnia even with ativan to 1.5 tabs. Ipreviouslycalled in Trazodone '50mg'$  nightly as needed (06/12/18) -Her slurred speech continues to progress. She continues to exercise and gaining strength of right side. She continues to practice walking with cane although still unsteady, and with walker. She has progressing uncontrolled function of her right hand. Her memory is improving.  -Dr. Mickeal Skinner would like her to continue dexa '4mg'$  daily.  -Her next brain MRI is 08/25/18.    6. H/o Left breast cancer, ER/PR negative, treated by Dr. Jana Hakim with lumpectomy, adj, chemo and radiation. -She was previously referred to genetics  -no evidence of recurrence   7.Acute deep vein thrombosis (DVT) of popliteal vein of left lower extremity, diagnosed on 02/28/2018  -She was found to have left lower extremity edema on his first day of chemoradiation, Doppler showed DVT -She iscurrentlyon coumadin '5mg'$  daily except 2.'5mg'$  on Monday, Wednesday, Saturday. QPR9 today (08/21/18). Will continue same dose.   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  -Stable, did not worsen with start of FOLFOX   9.Goal of care discussion  -she is full code now -Patient understand her treatment is palliative to prolong her life,her cancer is not curable unfortunately.  10.COPD -Shehas chronic cough -She presented to ED for COPD excerebration on 04/12/18. She has recovered and her breathing is improved. -stable, has not had cough recently, stable   11. Depressed Mood  -She has had 1 episode of feeling like she is going to die given her recent many symptoms and side effects, improved with ativan -I discussed the option of speaking with someone such as our Education officer, museum, she  declined for now  -I also discussed medication to help improve her mood. She has Ativan and will use that as needed for now.    PLAN: -Labs reviewed and adequate to proceed with FOLFOX and Leucovorin today, will continue same dose as last cycle   -Continue Coumadin '5mg'$  daily except 2.'5mg'$  on Monday, Wednesday and Saturday   -Lab, flush, f/u and treatment in 2 weekswith PET a few days before  -Continue Letrozole daily    No problem-specific Assessment & Plan notes found for this encounter.   Orders Placed This Encounter  Procedures  . NM PET Image Restag (PS) Skull Base To Thigh    Standing Status:   Future    Standing Expiration Date:   08/21/2019    Order Specific Question:   If indicated for the ordered procedure, I authorize the administration of a radiopharmaceutical per Radiology protocol    Answer:   Yes    Order Specific Question:   Preferred imaging location?    Answer:   [facility]    Order Specific Question:   Radiology Contrast Protocol - do NOT remove file path    Answer:   [email or web address]   All questions were answered. The patient knows to call the clinic with any problems, questions or concerns. No barriers to learning was detected. I spent 20 minutes counseling the patient face to face. The total time spent in the appointment was 25 minutes and more than 50% was on counseling and review of test results     [name], MD 08/21/2018   I, Joslyn Devon, am acting as scribe for [name], MD.   I have reviewed the above documentation for accuracy and  completeness, and I agree with the above.

## 2018-08-18 DIAGNOSIS — S22009A Unspecified fracture of unspecified thoracic vertebra, initial encounter for closed fracture: Secondary | ICD-10-CM | POA: Diagnosis not present

## 2018-08-18 DIAGNOSIS — M549 Dorsalgia, unspecified: Secondary | ICD-10-CM | POA: Diagnosis not present

## 2018-08-18 DIAGNOSIS — M5136 Other intervertebral disc degeneration, lumbar region: Secondary | ICD-10-CM | POA: Diagnosis not present

## 2018-08-18 DIAGNOSIS — M4316 Spondylolisthesis, lumbar region: Secondary | ICD-10-CM | POA: Diagnosis not present

## 2018-08-18 DIAGNOSIS — M47816 Spondylosis without myelopathy or radiculopathy, lumbar region: Secondary | ICD-10-CM | POA: Diagnosis not present

## 2018-08-18 DIAGNOSIS — M545 Low back pain: Secondary | ICD-10-CM | POA: Diagnosis not present

## 2018-08-18 DIAGNOSIS — M48061 Spinal stenosis, lumbar region without neurogenic claudication: Secondary | ICD-10-CM | POA: Diagnosis not present

## 2018-08-19 ENCOUNTER — Other Ambulatory Visit: Payer: Self-pay | Admitting: Hematology

## 2018-08-21 ENCOUNTER — Other Ambulatory Visit: Payer: Self-pay | Admitting: Radiation Therapy

## 2018-08-21 ENCOUNTER — Encounter: Payer: Self-pay | Admitting: Hematology

## 2018-08-21 ENCOUNTER — Inpatient Hospital Stay: Payer: Medicare Other

## 2018-08-21 ENCOUNTER — Inpatient Hospital Stay (HOSPITAL_BASED_OUTPATIENT_CLINIC_OR_DEPARTMENT_OTHER): Payer: Medicare Other | Admitting: Hematology

## 2018-08-21 ENCOUNTER — Inpatient Hospital Stay: Payer: Medicare Other | Attending: Hematology

## 2018-08-21 ENCOUNTER — Other Ambulatory Visit: Payer: Self-pay

## 2018-08-21 VITALS — BP 157/76 | HR 82 | Temp 98.5°F | Resp 17 | Ht 60.0 in | Wt 190.4 lb

## 2018-08-21 DIAGNOSIS — F329 Major depressive disorder, single episode, unspecified: Secondary | ICD-10-CM | POA: Insufficient documentation

## 2018-08-21 DIAGNOSIS — C7931 Secondary malignant neoplasm of brain: Secondary | ICD-10-CM | POA: Insufficient documentation

## 2018-08-21 DIAGNOSIS — I739 Peripheral vascular disease, unspecified: Secondary | ICD-10-CM | POA: Insufficient documentation

## 2018-08-21 DIAGNOSIS — M545 Low back pain: Secondary | ICD-10-CM

## 2018-08-21 DIAGNOSIS — N281 Cyst of kidney, acquired: Secondary | ICD-10-CM | POA: Insufficient documentation

## 2018-08-21 DIAGNOSIS — T380X5A Adverse effect of glucocorticoids and synthetic analogues, initial encounter: Secondary | ICD-10-CM | POA: Insufficient documentation

## 2018-08-21 DIAGNOSIS — Z79899 Other long term (current) drug therapy: Secondary | ICD-10-CM | POA: Diagnosis not present

## 2018-08-21 DIAGNOSIS — E785 Hyperlipidemia, unspecified: Secondary | ICD-10-CM | POA: Diagnosis not present

## 2018-08-21 DIAGNOSIS — J449 Chronic obstructive pulmonary disease, unspecified: Secondary | ICD-10-CM | POA: Diagnosis not present

## 2018-08-21 DIAGNOSIS — C50911 Malignant neoplasm of unspecified site of right female breast: Secondary | ICD-10-CM | POA: Diagnosis not present

## 2018-08-21 DIAGNOSIS — Z923 Personal history of irradiation: Secondary | ICD-10-CM | POA: Insufficient documentation

## 2018-08-21 DIAGNOSIS — I82432 Acute embolism and thrombosis of left popliteal vein: Secondary | ICD-10-CM

## 2018-08-21 DIAGNOSIS — G629 Polyneuropathy, unspecified: Secondary | ICD-10-CM | POA: Insufficient documentation

## 2018-08-21 DIAGNOSIS — R110 Nausea: Secondary | ICD-10-CM | POA: Insufficient documentation

## 2018-08-21 DIAGNOSIS — I700 Atherosclerosis of aorta: Secondary | ICD-10-CM | POA: Diagnosis not present

## 2018-08-21 DIAGNOSIS — Z87891 Personal history of nicotine dependence: Secondary | ICD-10-CM | POA: Diagnosis not present

## 2018-08-21 DIAGNOSIS — I824Z2 Acute embolism and thrombosis of unspecified deep veins of left distal lower extremity: Secondary | ICD-10-CM

## 2018-08-21 DIAGNOSIS — M546 Pain in thoracic spine: Secondary | ICD-10-CM | POA: Diagnosis not present

## 2018-08-21 DIAGNOSIS — R1310 Dysphagia, unspecified: Secondary | ICD-10-CM | POA: Insufficient documentation

## 2018-08-21 DIAGNOSIS — R55 Syncope and collapse: Secondary | ICD-10-CM | POA: Insufficient documentation

## 2018-08-21 DIAGNOSIS — I614 Nontraumatic intracerebral hemorrhage in cerebellum: Secondary | ICD-10-CM

## 2018-08-21 DIAGNOSIS — C159 Malignant neoplasm of esophagus, unspecified: Secondary | ICD-10-CM | POA: Diagnosis not present

## 2018-08-21 DIAGNOSIS — Z7189 Other specified counseling: Secondary | ICD-10-CM | POA: Diagnosis not present

## 2018-08-21 DIAGNOSIS — Z5111 Encounter for antineoplastic chemotherapy: Secondary | ICD-10-CM | POA: Diagnosis not present

## 2018-08-21 DIAGNOSIS — C50111 Malignant neoplasm of central portion of right female breast: Secondary | ICD-10-CM | POA: Insufficient documentation

## 2018-08-21 DIAGNOSIS — Z7901 Long term (current) use of anticoagulants: Secondary | ICD-10-CM | POA: Insufficient documentation

## 2018-08-21 DIAGNOSIS — G720 Drug-induced myopathy: Secondary | ICD-10-CM | POA: Diagnosis not present

## 2018-08-21 DIAGNOSIS — R05 Cough: Secondary | ICD-10-CM | POA: Diagnosis not present

## 2018-08-21 DIAGNOSIS — R918 Other nonspecific abnormal finding of lung field: Secondary | ICD-10-CM | POA: Insufficient documentation

## 2018-08-21 DIAGNOSIS — Z95828 Presence of other vascular implants and grafts: Secondary | ICD-10-CM

## 2018-08-21 LAB — CBC WITH DIFFERENTIAL (CANCER CENTER ONLY)
Abs Immature Granulocytes: 0.14 10*3/uL — ABNORMAL HIGH (ref 0.00–0.07)
Basophils Absolute: 0 10*3/uL (ref 0.0–0.1)
Basophils Relative: 0 %
Eosinophils Absolute: 0 10*3/uL (ref 0.0–0.5)
Eosinophils Relative: 0 %
HCT: 32.1 % — ABNORMAL LOW (ref 36.0–46.0)
Hemoglobin: 10.2 g/dL — ABNORMAL LOW (ref 12.0–15.0)
Immature Granulocytes: 2 %
Lymphocytes Relative: 3 %
Lymphs Abs: 0.2 10*3/uL — ABNORMAL LOW (ref 0.7–4.0)
MCH: 33.3 pg (ref 26.0–34.0)
MCHC: 31.8 g/dL (ref 30.0–36.0)
MCV: 104.9 fL — ABNORMAL HIGH (ref 80.0–100.0)
Monocytes Absolute: 0.6 10*3/uL (ref 0.1–1.0)
Monocytes Relative: 7 %
Neutro Abs: 7.6 10*3/uL (ref 1.7–7.7)
Neutrophils Relative %: 88 %
Platelet Count: 153 10*3/uL (ref 150–400)
RBC: 3.06 MIL/uL — ABNORMAL LOW (ref 3.87–5.11)
RDW: 17.3 % — ABNORMAL HIGH (ref 11.5–15.5)
WBC Count: 8.6 10*3/uL (ref 4.0–10.5)
nRBC: 0.7 % — ABNORMAL HIGH (ref 0.0–0.2)

## 2018-08-21 LAB — PROTIME-INR
INR: 2 — ABNORMAL HIGH (ref 0.8–1.2)
Prothrombin Time: 22.6 seconds — ABNORMAL HIGH (ref 11.4–15.2)

## 2018-08-21 LAB — CMP (CANCER CENTER ONLY)
ALT: 18 U/L (ref 0–44)
AST: 14 U/L — ABNORMAL LOW (ref 15–41)
Albumin: 2.9 g/dL — ABNORMAL LOW (ref 3.5–5.0)
Alkaline Phosphatase: 150 U/L — ABNORMAL HIGH (ref 38–126)
Anion gap: 10 (ref 5–15)
BUN: 11 mg/dL (ref 8–23)
CO2: 24 mmol/L (ref 22–32)
Calcium: 8.6 mg/dL — ABNORMAL LOW (ref 8.9–10.3)
Chloride: 104 mmol/L (ref 98–111)
Creatinine: 0.95 mg/dL (ref 0.44–1.00)
GFR, Est AFR Am: >60 mL/min (ref >60)
GFR, Estimated: >60 mL/min (ref >60)
Glucose, Bld: 85 mg/dL (ref 70–99)
Potassium: 3.7 mmol/L (ref 3.5–5.1)
Sodium: 138 mmol/L (ref 135–145)
Total Bilirubin: 0.5 mg/dL (ref 0.3–1.2)
Total Protein: 5.7 g/dL — ABNORMAL LOW (ref 6.5–8.1)

## 2018-08-21 MED ORDER — SODIUM CHLORIDE 0.9 % IV SOLN
Freq: Once | INTRAVENOUS | Status: AC
  Administered 2018-08-21: 11:00:00 via INTRAVENOUS
  Filled 2018-08-21: qty 5

## 2018-08-21 MED ORDER — OXALIPLATIN CHEMO INJECTION 100 MG/20ML
75.0000 mg/m2 | Freq: Once | INTRAVENOUS | Status: AC
  Administered 2018-08-21: 145 mg via INTRAVENOUS
  Filled 2018-08-21: qty 20

## 2018-08-21 MED ORDER — PALONOSETRON HCL INJECTION 0.25 MG/5ML
INTRAVENOUS | Status: AC
  Filled 2018-08-21: qty 5

## 2018-08-21 MED ORDER — SODIUM CHLORIDE 0.9 % IV SOLN
2400.0000 mg/m2 | INTRAVENOUS | Status: DC
  Administered 2018-08-21: 4600 mg via INTRAVENOUS
  Filled 2018-08-21: qty 92

## 2018-08-21 MED ORDER — DEXTROSE 5 % IV SOLN
Freq: Once | INTRAVENOUS | Status: AC
  Administered 2018-08-21: 11:00:00 via INTRAVENOUS
  Filled 2018-08-21: qty 250

## 2018-08-21 MED ORDER — HEPARIN SOD (PORK) LOCK FLUSH 100 UNIT/ML IV SOLN
500.0000 [IU] | Freq: Once | INTRAVENOUS | Status: DC | PRN
  Filled 2018-08-21: qty 5

## 2018-08-21 MED ORDER — PALONOSETRON HCL INJECTION 0.25 MG/5ML
0.2500 mg | Freq: Once | INTRAVENOUS | Status: AC
  Administered 2018-08-21: 0.25 mg via INTRAVENOUS

## 2018-08-21 MED ORDER — LEUCOVORIN CALCIUM INJECTION 350 MG
400.0000 mg/m2 | Freq: Once | INTRAVENOUS | Status: AC
  Administered 2018-08-21: 764 mg via INTRAVENOUS
  Filled 2018-08-21: qty 38.2

## 2018-08-21 MED ORDER — SODIUM CHLORIDE 0.9% FLUSH
10.0000 mL | INTRAVENOUS | Status: DC | PRN
  Administered 2018-08-21: 10 mL
  Filled 2018-08-21: qty 10

## 2018-08-21 MED ORDER — SODIUM CHLORIDE 0.9% FLUSH
10.0000 mL | Freq: Once | INTRAVENOUS | Status: AC
  Administered 2018-08-21: 10 mL
  Filled 2018-08-21: qty 10

## 2018-08-21 NOTE — Patient Instructions (Signed)
[facility] Discharge Instructions for Patients Receiving Chemotherapy  Today you received the following chemotherapy agents Oxaliplatin, Leucovorin, 5-FU. To help prevent nausea and vomiting after your treatment, we encourage you to take your nausea medication. DO NOT TAKE ZOFRAN FOR THREE DAYS AFTER TREATMENT.   If you develop nausea and vomiting that is not controlled by your nausea medication, call the clinic.   BELOW ARE SYMPTOMS THAT SHOULD BE REPORTED IMMEDIATELY:  *FEVER GREATER THAN 100.5 F  *CHILLS WITH OR WITHOUT FEVER  NAUSEA AND VOMITING THAT IS NOT CONTROLLED WITH YOUR NAUSEA MEDICATION  *UNUSUAL SHORTNESS OF BREATH  *UNUSUAL BRUISING OR BLEEDING  TENDERNESS IN MOUTH AND THROAT WITH OR WITHOUT PRESENCE OF ULCERS  *URINARY PROBLEMS  *BOWEL PROBLEMS  UNUSUAL RASH Items with * indicate a potential emergency and should be followed up as soon as possible.  Feel free to call the clinic should you have any questions or concerns. The clinic phone number is [phone].  Please show the [place] at check-in to the Emergency Department and triage nurse.

## 2018-08-21 NOTE — Patient Instructions (Signed)

## 2018-08-22 ENCOUNTER — Other Ambulatory Visit: Payer: Medicare Other

## 2018-08-22 ENCOUNTER — Telehealth: Payer: Self-pay | Admitting: Hematology

## 2018-08-22 DIAGNOSIS — R0602 Shortness of breath: Secondary | ICD-10-CM | POA: Diagnosis not present

## 2018-08-22 DIAGNOSIS — I10 Essential (primary) hypertension: Secondary | ICD-10-CM | POA: Diagnosis not present

## 2018-08-22 DIAGNOSIS — R0902 Hypoxemia: Secondary | ICD-10-CM | POA: Diagnosis not present

## 2018-08-22 DIAGNOSIS — R609 Edema, unspecified: Secondary | ICD-10-CM | POA: Diagnosis not present

## 2018-08-22 NOTE — Telephone Encounter (Signed)
Scheduled appt per 6/8 los. °

## 2018-08-23 ENCOUNTER — Observation Stay (HOSPITAL_COMMUNITY)
Admission: EM | Admit: 2018-08-23 | Discharge: 2018-08-24 | Disposition: A | Discharge status: Home / Self Care | Payer: Medicare Other | Attending: Family Medicine | Admitting: Family Medicine

## 2018-08-23 ENCOUNTER — Ambulatory Visit: Payer: Medicare Other

## 2018-08-23 ENCOUNTER — Other Ambulatory Visit: Payer: Self-pay

## 2018-08-23 ENCOUNTER — Encounter (HOSPITAL_COMMUNITY): Payer: Self-pay | Admitting: Emergency Medicine

## 2018-08-23 ENCOUNTER — Emergency Department (HOSPITAL_COMMUNITY): Payer: Medicare Other

## 2018-08-23 ENCOUNTER — Telehealth: Payer: Self-pay | Admitting: *Deleted

## 2018-08-23 DIAGNOSIS — J9601 Acute respiratory failure with hypoxia: Secondary | ICD-10-CM

## 2018-08-23 DIAGNOSIS — J9621 Acute and chronic respiratory failure with hypoxia: Secondary | ICD-10-CM | POA: Diagnosis present

## 2018-08-23 DIAGNOSIS — C50111 Malignant neoplasm of central portion of right female breast: Secondary | ICD-10-CM | POA: Diagnosis not present

## 2018-08-23 DIAGNOSIS — R0602 Shortness of breath: Secondary | ICD-10-CM | POA: Diagnosis not present

## 2018-08-23 DIAGNOSIS — Z8249 Family history of ischemic heart disease and other diseases of the circulatory system: Secondary | ICD-10-CM | POA: Diagnosis not present

## 2018-08-23 DIAGNOSIS — Z9221 Personal history of antineoplastic chemotherapy: Secondary | ICD-10-CM | POA: Diagnosis not present

## 2018-08-23 DIAGNOSIS — Z170 Estrogen receptor positive status [ER+]: Secondary | ICD-10-CM | POA: Diagnosis not present

## 2018-08-23 DIAGNOSIS — Z1159 Encounter for screening for other viral diseases: Secondary | ICD-10-CM | POA: Insufficient documentation

## 2018-08-23 DIAGNOSIS — I824Z2 Acute embolism and thrombosis of unspecified deep veins of left distal lower extremity: Secondary | ICD-10-CM | POA: Diagnosis not present

## 2018-08-23 DIAGNOSIS — M1990 Unspecified osteoarthritis, unspecified site: Secondary | ICD-10-CM | POA: Diagnosis not present

## 2018-08-23 DIAGNOSIS — Z8541 Personal history of malignant neoplasm of cervix uteri: Secondary | ICD-10-CM | POA: Diagnosis not present

## 2018-08-23 DIAGNOSIS — I700 Atherosclerosis of aorta: Secondary | ICD-10-CM | POA: Diagnosis not present

## 2018-08-23 DIAGNOSIS — C7931 Secondary malignant neoplasm of brain: Secondary | ICD-10-CM | POA: Diagnosis present

## 2018-08-23 DIAGNOSIS — Z923 Personal history of irradiation: Secondary | ICD-10-CM | POA: Insufficient documentation

## 2018-08-23 DIAGNOSIS — I5031 Acute diastolic (congestive) heart failure: Secondary | ICD-10-CM | POA: Insufficient documentation

## 2018-08-23 DIAGNOSIS — Z86718 Personal history of other venous thrombosis and embolism: Secondary | ICD-10-CM | POA: Diagnosis not present

## 2018-08-23 DIAGNOSIS — C159 Malignant neoplasm of esophagus, unspecified: Secondary | ICD-10-CM | POA: Diagnosis not present

## 2018-08-23 DIAGNOSIS — M5136 Other intervertebral disc degeneration, lumbar region: Secondary | ICD-10-CM | POA: Insufficient documentation

## 2018-08-23 DIAGNOSIS — R0902 Hypoxemia: Secondary | ICD-10-CM | POA: Diagnosis not present

## 2018-08-23 DIAGNOSIS — Z87891 Personal history of nicotine dependence: Secondary | ICD-10-CM | POA: Diagnosis not present

## 2018-08-23 DIAGNOSIS — Z7901 Long term (current) use of anticoagulants: Secondary | ICD-10-CM | POA: Insufficient documentation

## 2018-08-23 DIAGNOSIS — J441 Chronic obstructive pulmonary disease with (acute) exacerbation: Secondary | ICD-10-CM | POA: Diagnosis not present

## 2018-08-23 DIAGNOSIS — I739 Peripheral vascular disease, unspecified: Secondary | ICD-10-CM | POA: Diagnosis not present

## 2018-08-23 DIAGNOSIS — E785 Hyperlipidemia, unspecified: Secondary | ICD-10-CM | POA: Insufficient documentation

## 2018-08-23 DIAGNOSIS — Z85841 Personal history of malignant neoplasm of brain: Secondary | ICD-10-CM | POA: Diagnosis not present

## 2018-08-23 DIAGNOSIS — M48061 Spinal stenosis, lumbar region without neurogenic claudication: Secondary | ICD-10-CM | POA: Diagnosis not present

## 2018-08-23 DIAGNOSIS — Z853 Personal history of malignant neoplasm of breast: Secondary | ICD-10-CM | POA: Diagnosis not present

## 2018-08-23 DIAGNOSIS — J189 Pneumonia, unspecified organism: Secondary | ICD-10-CM | POA: Diagnosis present

## 2018-08-23 DIAGNOSIS — Z7951 Long term (current) use of inhaled steroids: Secondary | ICD-10-CM | POA: Diagnosis not present

## 2018-08-23 DIAGNOSIS — J690 Pneumonitis due to inhalation of food and vomit: Secondary | ICD-10-CM

## 2018-08-23 DIAGNOSIS — R918 Other nonspecific abnormal finding of lung field: Secondary | ICD-10-CM | POA: Diagnosis not present

## 2018-08-23 DIAGNOSIS — Z79899 Other long term (current) drug therapy: Secondary | ICD-10-CM | POA: Insufficient documentation

## 2018-08-23 DIAGNOSIS — Z66 Do not resuscitate: Secondary | ICD-10-CM | POA: Insufficient documentation

## 2018-08-23 DIAGNOSIS — M4854XA Collapsed vertebra, not elsewhere classified, thoracic region, initial encounter for fracture: Secondary | ICD-10-CM | POA: Diagnosis not present

## 2018-08-23 DIAGNOSIS — I10 Essential (primary) hypertension: Secondary | ICD-10-CM | POA: Diagnosis present

## 2018-08-23 DIAGNOSIS — I110 Hypertensive heart disease with heart failure: Secondary | ICD-10-CM | POA: Diagnosis not present

## 2018-08-23 DIAGNOSIS — Z20828 Contact with and (suspected) exposure to other viral communicable diseases: Secondary | ICD-10-CM | POA: Diagnosis not present

## 2018-08-23 LAB — CBC WITH DIFFERENTIAL/PLATELET
Abs Immature Granulocytes: 0.05 10*3/uL (ref 0.00–0.07)
Basophils Absolute: 0 10*3/uL (ref 0.0–0.1)
Basophils Relative: 0 %
Eosinophils Absolute: 0 10*3/uL (ref 0.0–0.5)
Eosinophils Relative: 0 %
HCT: 30.5 % — ABNORMAL LOW (ref 36.0–46.0)
Hemoglobin: 9.8 g/dL — ABNORMAL LOW (ref 12.0–15.0)
Immature Granulocytes: 1 %
Lymphocytes Relative: 2 %
Lymphs Abs: 0.2 10*3/uL — ABNORMAL LOW (ref 0.7–4.0)
MCH: 33.1 pg (ref 26.0–34.0)
MCHC: 32.1 g/dL (ref 30.0–36.0)
MCV: 103 fL — ABNORMAL HIGH (ref 80.0–100.0)
Monocytes Absolute: 0.4 10*3/uL (ref 0.1–1.0)
Monocytes Relative: 5 %
Neutro Abs: 7.7 10*3/uL (ref 1.7–7.7)
Neutrophils Relative %: 92 %
Platelets: 147 10*3/uL — ABNORMAL LOW (ref 150–400)
RBC: 2.96 MIL/uL — ABNORMAL LOW (ref 3.87–5.11)
RDW: 16.6 % — ABNORMAL HIGH (ref 11.5–15.5)
WBC: 8.4 10*3/uL (ref 4.0–10.5)
nRBC: 0.2 % (ref 0.0–0.2)

## 2018-08-23 LAB — BRAIN NATRIURETIC PEPTIDE: B Natriuretic Peptide: 219.1 pg/mL — ABNORMAL HIGH (ref 0.0–100.0)

## 2018-08-23 LAB — PROTIME-INR
INR: 2 — ABNORMAL HIGH (ref 0.8–1.2)
Prothrombin Time: 22.2 seconds — ABNORMAL HIGH (ref 11.4–15.2)

## 2018-08-23 LAB — SARS CORONAVIRUS 2 BY RT PCR (HOSPITAL ORDER, PERFORMED IN ~~LOC~~ HOSPITAL LAB): SARS Coronavirus 2: NEGATIVE

## 2018-08-23 LAB — BASIC METABOLIC PANEL
Anion gap: 11 (ref 5–15)
BUN: 19 mg/dL (ref 8–23)
CO2: 21 mmol/L — ABNORMAL LOW (ref 22–32)
Calcium: 8.5 mg/dL — ABNORMAL LOW (ref 8.9–10.3)
Chloride: 98 mmol/L (ref 98–111)
Creatinine, Ser: 0.75 mg/dL (ref 0.44–1.00)
GFR calc Af Amer: >60 mL/min (ref >60)
GFR calc non Af Amer: >60 mL/min (ref >60)
Glucose, Bld: 94 mg/dL (ref 70–99)
Potassium: 3.7 mmol/L (ref 3.5–5.1)
Sodium: 130 mmol/L — ABNORMAL LOW (ref 135–145)

## 2018-08-23 LAB — TROPONIN I: Troponin I: <0.03 ng/mL (ref <0.03)

## 2018-08-23 LAB — PROCALCITONIN: Procalcitonin: 0.17 ng/mL

## 2018-08-23 MED ORDER — TRAZODONE HCL 50 MG PO TABS: 50.0000 mg | ORAL_TABLET | Freq: Every evening | ORAL | Status: DC | PRN

## 2018-08-23 MED ORDER — AMOXICILLIN-POT CLAVULANATE 875-125 MG PO TABS: 1.0000 | ORAL_TABLET | Freq: Once | ORAL | Status: DC

## 2018-08-23 MED ORDER — SODIUM CHLORIDE (PF) 0.9 % IJ SOLN
INTRAMUSCULAR | Status: AC
  Administered 2018-08-23: 05:00:00
  Filled 2018-08-23: qty 50

## 2018-08-23 MED ORDER — VANCOMYCIN HCL IN DEXTROSE 1-5 GM/200ML-% IV SOLN
1000.0000 mg | Freq: Once | INTRAVENOUS | Status: AC
  Administered 2018-08-23: 1000 mg via INTRAVENOUS
  Filled 2018-08-23: qty 200

## 2018-08-23 MED ORDER — SODIUM CHLORIDE 0.9% FLUSH
10.0000 mL | INTRAVENOUS | Status: DC | PRN
  Administered 2018-08-23: 10 mL
  Filled 2018-08-23: qty 10

## 2018-08-23 MED ORDER — ACETAMINOPHEN 325 MG PO TABS
650.0000 mg | ORAL_TABLET | Freq: Four times a day (QID) | ORAL | Status: DC | PRN
  Administered 2018-08-23: 325 mg via ORAL
  Filled 2018-08-23: qty 2

## 2018-08-23 MED ORDER — SUCRALFATE 1 G PO TABS: 1.0000 g | ORAL_TABLET | Freq: Four times a day (QID) | ORAL | Status: DC | PRN

## 2018-08-23 MED ORDER — DEXAMETHASONE 4 MG PO TABS
4.0000 mg | ORAL_TABLET | Freq: Every day | ORAL | Status: DC
  Administered 2018-08-24: 4 mg via ORAL
  Filled 2018-08-23: qty 1

## 2018-08-23 MED ORDER — LETROZOLE 2.5 MG PO TABS
2.5000 mg | ORAL_TABLET | Freq: Every day | ORAL | Status: DC
  Administered 2018-08-23 – 2018-08-24 (×2): 2.5 mg via ORAL
  Filled 2018-08-23 (×2): qty 1

## 2018-08-23 MED ORDER — WARFARIN - PHARMACIST DOSING INPATIENT: Freq: Every day | Status: DC

## 2018-08-23 MED ORDER — IOHEXOL 350 MG/ML SOLN
100.0000 mL | Freq: Once | INTRAVENOUS | Status: AC | PRN
  Administered 2018-08-23: 100 mL via INTRAVENOUS

## 2018-08-23 MED ORDER — FUROSEMIDE 10 MG/ML IJ SOLN
40.0000 mg | Freq: Two times a day (BID) | INTRAMUSCULAR | Status: AC
  Administered 2018-08-23 (×2): 40 mg via INTRAVENOUS
  Filled 2018-08-23 (×2): qty 4

## 2018-08-23 MED ORDER — ONDANSETRON HCL 4 MG/2ML IJ SOLN: 4.0000 mg | Freq: Four times a day (QID) | INTRAMUSCULAR | Status: DC | PRN

## 2018-08-23 MED ORDER — LIDOCAINE VISCOUS HCL 2 % MT SOLN
15.0000 mL | Freq: Three times a day (TID) | OROMUCOSAL | Status: DC
  Administered 2018-08-24: 15 mL via OROMUCOSAL
  Filled 2018-08-23: qty 15

## 2018-08-23 MED ORDER — SODIUM CHLORIDE 0.9 % IV SOLN
3.0000 g | Freq: Four times a day (QID) | INTRAVENOUS | Status: DC
  Administered 2018-08-23 – 2018-08-24 (×5): 3 g via INTRAVENOUS
  Filled 2018-08-23 (×7): qty 3

## 2018-08-23 MED ORDER — ACETAMINOPHEN 650 MG RE SUPP: 650.0000 mg | Freq: Four times a day (QID) | RECTAL | Status: DC | PRN

## 2018-08-23 MED ORDER — ATORVASTATIN CALCIUM 10 MG PO TABS
20.0000 mg | ORAL_TABLET | Freq: Every day | ORAL | Status: DC
  Administered 2018-08-23 – 2018-08-24 (×2): 20 mg via ORAL
  Filled 2018-08-23 (×2): qty 2

## 2018-08-23 MED ORDER — HYDROCODONE-ACETAMINOPHEN 7.5-325 MG PO TABS
1.0000 | ORAL_TABLET | Freq: Four times a day (QID) | ORAL | Status: DC | PRN
  Administered 2018-08-23 – 2018-08-24 (×4): 1 via ORAL
  Filled 2018-08-23 (×4): qty 1

## 2018-08-23 MED ORDER — HEPARIN SOD (PORK) LOCK FLUSH 100 UNIT/ML IV SOLN
500.0000 [IU] | Freq: Once | INTRAVENOUS | Status: AC | PRN
  Administered 2018-08-23: 13:00:00 500 [IU]
  Filled 2018-08-23: qty 5

## 2018-08-23 MED ORDER — SODIUM CHLORIDE 0.9 % IV SOLN
2.0000 g | Freq: Three times a day (TID) | INTRAVENOUS | Status: DC
  Filled 2018-08-23: qty 2

## 2018-08-23 MED ORDER — MAGIC MOUTHWASH
5.0000 mL | Freq: Four times a day (QID) | ORAL | Status: DC
  Administered 2018-08-24: 5 mL via ORAL
  Filled 2018-08-23 (×5): qty 5

## 2018-08-23 MED ORDER — LORAZEPAM 0.5 MG PO TABS: 0.5000 mg | ORAL_TABLET | Freq: Three times a day (TID) | ORAL | Status: DC | PRN

## 2018-08-23 MED ORDER — ANASTROZOLE 1 MG PO TABS: 1.0000 mg | ORAL_TABLET | Freq: Every day | ORAL | Status: DC

## 2018-08-23 MED ORDER — PANTOPRAZOLE SODIUM 40 MG PO TBEC
40.0000 mg | DELAYED_RELEASE_TABLET | Freq: Every day | ORAL | Status: DC
  Administered 2018-08-23 – 2018-08-24 (×2): 40 mg via ORAL
  Filled 2018-08-23 (×2): qty 1

## 2018-08-23 MED ORDER — POTASSIUM CHLORIDE CRYS ER 20 MEQ PO TBCR
40.0000 meq | EXTENDED_RELEASE_TABLET | Freq: Two times a day (BID) | ORAL | Status: AC
  Administered 2018-08-23 (×2): 40 meq via ORAL
  Filled 2018-08-23 (×3): qty 2

## 2018-08-23 MED ORDER — CYCLOBENZAPRINE HCL 5 MG PO TABS
5.0000 mg | ORAL_TABLET | Freq: Three times a day (TID) | ORAL | Status: DC | PRN
  Administered 2018-08-23: 5 mg via ORAL
  Filled 2018-08-23: qty 1

## 2018-08-23 MED ORDER — WARFARIN SODIUM 2.5 MG PO TABS
2.5000 mg | ORAL_TABLET | Freq: Once | ORAL | Status: AC
  Administered 2018-08-23: 2.5 mg via ORAL
  Filled 2018-08-23 (×2): qty 1

## 2018-08-23 MED ORDER — ALBUTEROL SULFATE (2.5 MG/3ML) 0.083% IN NEBU: 3.0000 mL | INHALATION_SOLUTION | Freq: Four times a day (QID) | RESPIRATORY_TRACT | Status: DC | PRN

## 2018-08-23 MED ORDER — ONDANSETRON HCL 4 MG PO TABS: 4.0000 mg | ORAL_TABLET | Freq: Four times a day (QID) | ORAL | Status: DC | PRN

## 2018-08-23 NOTE — ED Notes (Addendum)
Pt states that she feels like swelling in her abdomen is causing SOB. Also having difficulty swallowing.

## 2018-08-23 NOTE — Telephone Encounter (Signed)
Received vm call from daughter, Joelene Millin stating that mother was admitted via [facility] ER today. Dr Burr Medico notified.

## 2018-08-23 NOTE — ED Notes (Signed)
Bed: WA07 Expected date:  Expected time:  Means of arrival:  Comments: 

## 2018-08-23 NOTE — ED Notes (Signed)
Bed: WA07 Expected date:  Expected time:  Means of arrival:  Comments: Closing section

## 2018-08-23 NOTE — Progress Notes (Signed)
Per Kerrin Champagne Rn, ER called to ask if we can D/C a port for a Pt. In the ER. Nurse writing this note went to the ER D/C'd the pump, blood return noted, heparin locked old dressing removed cleaned  Area around huber needle and new Biopatch placed with OpSite dressing, Pt. Allergic to sorbaview dressing.

## 2018-08-23 NOTE — Progress Notes (Signed)
[place] for warfarin Indication: hx DVT  Allergies  Allergen Reactions  . Flonase [Fluticasone Propionate] Other (See Comments)    Nose bleeds    Patient Measurements: Height: 5' (152.4 cm) Weight: 190 lb (86.2 kg) IBW/kg (Calculated) : 45.5 Heparin Dosing Weight:   Vital Signs: Temp: 97.7 F (36.5 C) (06/10 0026) Temp Source: Oral (06/10 0026) BP: 156/88 (06/10 0615) Pulse Rate: 108 (06/10 0615)  Labs: Recent Labs    08/21/18 0856 08/23/18 0157 08/23/18 0232  HGB 10.2*  --  9.8*  HCT 32.1*  --  30.5*  PLT 153  --  147*  LABPROT 22.6*  --   --   INR 2.0*  --   --   CREATININE 0.95 0.75  --   TROPONINI  --  <0.03  --     Estimated Creatinine Clearance: 62.9 mL/min (by C-G formula based on SCr of 0.75 mg/dL).   Medications:  PTA warfarin regimen: 5mg  daily except 2.5 mg on MWSat  Assessment: Patient's a 73 y.o F with hx of breast and esophageal cancer with brain mets currently undergoing chemotherapy txment and DVT (Dec 2019) on warfarin PTA, presented to the ED on 6/10 with c/o SOB. Chest CTA on 6/10 was negative for PE.  Warfarin resumed on admission for hx DVT.  Today, 08/23/2018: - INR is therapeutic at 2 - cbc relatively stable - Monitor INR closely as pt's currently on abx and this can make pt more sensitive to warfarin   Goal of Therapy:  INR 2-3 Monitor platelets by anticoagulation protocol: Yes   Plan:  - warfarin 2.5 mg PO x1 today (home dose) - daily INR - monitor for s/s bleeding  Per Dr. Delene Loll request, will also start unasyn for PNA - unasyn 3gm IV q6h - with stable renal function, pharmacy will sign off for abx.  Re-consult [place] if need further assistance  Jestin Burbach P 08/23/2018,8:13 AM

## 2018-08-23 NOTE — ED Notes (Addendum)
Pt was transferred from a ED stretcher to a hospital bed and given peanut butter, cheese, and ginger ale at this time. Pt currently resting and comfortable.

## 2018-08-23 NOTE — ED Notes (Signed)
IV team at bedside 

## 2018-08-23 NOTE — ED Notes (Signed)
Bed: DL25 Expected date:  Expected time:  Means of arrival:  Comments: Closing section

## 2018-08-23 NOTE — ED Notes (Signed)
ED TO INPATIENT HANDOFF REPORT  Name/Age/Gender Kristin Williams 73 y.o. female  Code Status Code Status History    Date Active Date Inactive Code Status Order ID Comments User Context   01/27/2018 0357 01/31/2018 1407 Full Code [id number]  Greta Doom, MD ED      Home/SNF/Other Home  Chief Complaint Shortness of Breath;ct pt  Level of Care/Admitting Diagnosis ED Disposition    ED Disposition Condition [facility] Area: [place] [100102]  Level of Care: Med-Surg [16]  Covid Evaluation: Screening Protocol (No Symptoms)  Diagnosis: Pneumonia [350093]  Admitting Physician: Domenic Polite [[id number]]  Attending Physician: Domenic Polite [[id number]]  Estimated length of stay: past midnight tomorrow  Certification:: I certify this patient will need inpatient services for at least 2 midnights  PT Class (Do Not Modify): Inpatient [101]  PT Acc Code (Do Not Modify): Private [1]       Medical History Past Medical History:  Diagnosis Date  . Arthritis   . Cancer (HCC)    Cervical  . Cancer ([place])    Breast  . 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 Complication of anesthesia   . COPD (chronic obstructive pulmonary disease) ([place])   . Headache(784.0)   . History of radiation therapy 02/28/19- 04/07/18   Esophagus, 1.8 Gy in 28 fractions for a total dose of 50.4 Gy.   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   . Hyperlipidemia   . Leg pain   . Peripheral vascular disease ([place])   . Personal history of chemotherapy   . Personal history of radiation therapy   . PONV (postoperative nausea and vomiting)     Allergies Allergies  Allergen Reactions  . Flonase [Fluticasone Propionate] Other (See Comments)    Nose bleeds    IV Location/Drains/Wounds Patient Lines/Drains/Airways Status   Active Line/Drains/Airways    Name:   Placement date:   Placement time:   Site:   Days:   Implanted Port  02/22/18 Right Chest   02/22/18    1619    Chest   182   Peripheral IV 08/23/18 Right Antecubital   08/23/18    0408    Antecubital   less than 1          Labs/Imaging Results for orders placed or performed during the hospital encounter of 08/23/18 (from the past 48 hour(s))  Basic metabolic panel     Status: Abnormal   Collection Time: 08/23/18  1:57 AM  Result Value Ref Range   Sodium 130 (L) 135 - 145 mmol/L   Potassium 3.7 3.5 - 5.1 mmol/L   Chloride 98 98 - 111 mmol/L   CO2 21 (L) 22 - 32 mmol/L   Glucose, Bld 94 70 - 99 mg/dL   BUN 19 8 - 23 mg/dL   Creatinine, Ser 0.75 0.44 - 1.00 mg/dL   Calcium 8.5 (L) 8.9 - 10.3 mg/dL   GFR calc non Af Amer >60 >60 mL/min   GFR calc Af Amer >60 >60 mL/min   Anion gap 11 5 - 15    Comment: Performed at [facility], [place] [address], [place], [place] [address]  Troponin I - ONCE - STAT     Status: None   Collection Time: 08/23/18  1:57 AM  Result Value Ref Range   Troponin I <0.03 <0.03 ng/mL    Comment: Performed at [facility],  [place] [address] [place], [place] [address]  Brain natriuretic peptide     Status: Abnormal   Collection Time: 08/23/18  1:57 AM  Result Value Ref Range   B Natriuretic Peptide 219.1 (H) 0.0 - 100.0 pg/mL    Comment: Performed at [facility], [place] [address] [place], [place] [address]  Procalcitonin - Baseline     Status: None   Collection Time: 08/23/18  1:57 AM  Result Value Ref Range   Procalcitonin 0.17 ng/mL    Comment:        Interpretation: PCT (Procalcitonin) <= 0.5 ng/mL: Systemic infection (sepsis) is not likely. Local bacterial infection is possible. (NOTE)       Sepsis PCT Algorithm           Lower Respiratory Tract                                      Infection PCT Algorithm    ----------------------------     ----------------------------         PCT < 0.25 ng/mL                PCT < 0.10 ng/mL         Strongly encourage              Strongly discourage   discontinuation of antibiotics    initiation of antibiotics    ----------------------------     -----------------------------       PCT 0.25 - 0.50 ng/mL            PCT 0.10 - 0.25 ng/mL               OR       >80% decrease in PCT            Discourage initiation of                                            antibiotics      Encourage discontinuation           of antibiotics    ----------------------------     -----------------------------         PCT >= 0.50 ng/mL              PCT 0.26 - 0.50 ng/mL               AND        <80% decrease in PCT             Encourage initiation of                                             antibiotics       Encourage continuation           of antibiotics    ----------------------------     -----------------------------        PCT >= 0.50 ng/mL                  PCT > 0.50 ng/mL               AND  increase in PCT                  Strongly encourage                                      initiation of antibiotics    Strongly encourage escalation           of antibiotics                                     -----------------------------                                           PCT <= 0.25 ng/mL                                                 OR                                        > 80% decrease in PCT                                     Discontinue / Do not initiate                                             antibiotics Performed at [facility] [address] [place], [place] [address]   CBC with Differential/Platelet     Status: Abnormal   Collection Time: 08/23/18  2:32 AM  Result Value Ref Range   WBC 8.4 4.0 - 10.5 K/uL   RBC 2.96 (L) 3.87 - 5.11 MIL/uL   Hemoglobin 9.8 (L) 12.0 - 15.0 g/dL   HCT 30.5 (L) 36.0 - 46.0 %   MCV 103.0 (H) 80.0 - 100.0 fL   MCH 33.1 26.0 - 34.0 pg   MCHC 32.1 30.0 - 36.0 g/dL   RDW 16.6 (H) 11.5 - 15.5 %   Platelets 147 (L) 150 - 400 K/uL   nRBC 0.2 0.0 -  0.2 %   Neutrophils Relative % 92 %   Neutro Abs 7.7 1.7 - 7.7 K/uL   Lymphocytes Relative 2 %   Lymphs Abs 0.2 (L) 0.7 - 4.0 K/uL   Monocytes Relative 5 %   Monocytes Absolute 0.4 0.1 - 1.0 K/uL   Eosinophils Relative 0 %   Eosinophils Absolute 0.0 0.0 - 0.5 K/uL   Basophils Relative 0 %   Basophils Absolute 0.0 0.0 - 0.1 K/uL   Immature Granulocytes 1 %   Abs Immature Granulocytes 0.05 0.00 - 0.07 K/uL    Comment: Performed at [facility], [place] [address], [place], [place] [address]  SARS Coronavirus 2 (CEPHEID- Performed in [facility] hospital lab), Hosp Order     Status: None   Collection Time: 08/23/18  5:57 AM  Result Value Ref Range   SARS Coronavirus 2 NEGATIVE NEGATIVE    Comment: (NOTE) If result is NEGATIVE SARS-CoV-2 target nucleic acids are NOT DETECTED. The SARS-CoV-2 RNA is generally detectable in upper and lower  respiratory specimens during the acute phase of infection. The lowest  concentration of SARS-CoV-2 viral copies this assay can detect is 250  copies / mL. A negative result does not preclude SARS-CoV-2 infection  and should not be used as the sole basis for treatment or other  patient management decisions.  A negative result may occur with  improper specimen collection / handling, submission of specimen other  than nasopharyngeal swab, presence of viral mutation(s) within the  areas targeted by this assay, and inadequate number of viral copies  (<250 copies / mL). A negative result must be combined with clinical  observations, patient history, and epidemiological information. If result is POSITIVE SARS-CoV-2 target nucleic acids are DETECTED. The SARS-CoV-2 RNA is generally detectable in upper and lower  respiratory specimens dur ing the acute phase of infection.  Positive  results are indicative of active infection with SARS-CoV-2.  Clinical  correlation with patient history and other diagnostic information is  necessary to  determine patient infection status.  Positive results do  not rule out bacterial infection or co-infection with other viruses. If result is PRESUMPTIVE POSTIVE SARS-CoV-2 nucleic acids MAY BE PRESENT.   A presumptive positive result was obtained on the submitted specimen  and confirmed on repeat testing.  While 2019 novel coronavirus  (SARS-CoV-2) nucleic acids may be present in the submitted sample  additional confirmatory testing may be necessary for epidemiological  and / or clinical management purposes  to differentiate between  SARS-CoV-2 and other Sarbecovirus currently known to infect humans.  If clinically indicated additional testing with an alternate test  methodology [phone]) is advised. The SARS-CoV-2 RNA is generally  detectable in upper and lower respiratory sp ecimens during the acute  phase of infection. The expected result is Negative. Fact Sheet for Patients:  StrictlyIdeas.no Fact Sheet for Healthcare Providers: BankingDealers.co.za This test is not yet approved or cleared by the [place] FDA and has been authorized for detection and/or diagnosis of SARS-CoV-2 by FDA under an Emergency Use Authorization (EUA).  This EUA will remain in effect (meaning this test can be used) for the duration of the COVID-19 declaration under Section 564(b)(1) of the Act, 21 U.S.C. section 360bbb-3(b)(1), unless the authorization is terminated or revoked sooner. Performed at [facility], [place] [address] [place], [place] [address]   Protime-INR     Status: Abnormal   Collection Time: 08/23/18  8:18 AM  Result Value Ref Range   Prothrombin Time 22.2 (H) 11.4 - 15.2 seconds   INR 2.0 (H) 0.8 - 1.2    Comment: (NOTE) INR goal varies based on device and disease states. Performed at [facility], [place] [address] [place], [place] [address]    Ct Angio Chest Pe W And/or Wo Contrast  Result  Date: 08/23/2018 CLINICAL DATA:  Hypoxia and shortness of breath. Personal history of esophageal cancer treated with radiation. Personal history of breast cancer. Personal history of brain metastases treated with radiation. Initial encounter. EXAM: CT ANGIOGRAPHY CHEST WITH CONTRAST TECHNIQUE: Multidetector CT imaging of the chest was performed using the standard protocol during bolus administration of intravenous contrast. Multiplanar CT image reconstructions and MIPs were obtained to evaluate the vascular anatomy. CONTRAST:  123mL OMNIPAQUE IOHEXOL 350 MG/ML SOLN COMPARISON:  One-view chest x-ray 08/23/2018 CT of the chest 01/26/2018. PET scan 02/13/2018 FINDINGS: Cardiovascular: Heart size upper limits of normal. Atherosclerotic changes are noted at the aortic arch. Calcifications are present at the origins the great vessels without focal stenosis. There is no aneurysm. Pulmonary artery opacification is excellent. No focal filling defects are present to suggest pulmonary emboli. Mediastinum/Nodes: No significant mediastinal, hilar or axillary adenopathy is present. There is moderate distal esophageal wall thickening circumferentially. Bulky mass lesion previously noted is no longer discretely evident. Esophageal thickening extends into the stomach. Lungs/Pleura: The right middle lobe pulmonary nodule measuring 12 mm is stable. Linear opacities at the right base likely reflect atelectasis. Infection is considered less likely. The left lung is clear. No significant effusions are present. There is no pneumothorax. Azygos fissure is noted. Upper Abdomen: Visualized upper abdomen is within normal limits. Musculoskeletal: T3, T8, T10, and T11 compression fractures are stable. There is no progression of the fractures or retropulsed bone. No focal lytic or blastic lesions are present. Review of the MIP images confirms the above findings. IMPRESSION: 1. No pulmonary embolus. 2. Linear densities at the right base likely  reflect atelectasis. Infection is considered less likely. 3. No other acute or focal abnormality to explain shortness of breath or hypoxia. 4. Residual circumferential distal esophageal thickening is less prominent than on the prior exam. No discrete mass lesion is evident. 5. Stable remote thoracic compression fractures. Electronically Signed   By: San Morelle M.D.   On: 08/23/2018 05:30   Dg Chest Portable 1 View  Result Date: 08/23/2018 CLINICAL DATA:  Shortness of breath EXAM: PORTABLE CHEST 1 VIEW COMPARISON:  Chest x-ray dated 04/09/2018. FINDINGS: There is a well-positioned right-sided Port-A-Cath. The heart size is enlarged. There are bibasilar airspace opacities. There is an azygos lobe. There is no pneumothorax. There is some mild blunting of the costophrenic angles bilaterally. There is hyperexpansion of the lungs. Aortic calcifications are noted. There is a stable calcification projecting over the left lower lobe, likely related to the patient's breast tissue. Previously noted nodular opacity in the right middle lobe is not well appreciated. IMPRESSION: 1. Well-positioned right-sided Port-A-Cath. 2. Mild cardiomegaly. 3. New right lower lobe airspace opacity which may represent atelectasis or infiltrate. Electronically Signed   By: Constance Holster M.D.   On: 08/23/2018 02:25    Pending Labs Unresulted Labs (From admission, onward)    Start     Ordered   08/24/18 0500  Procalcitonin  Daily,   R     08/23/18 0857   08/23/18 0818  Protime-INR  Daily,   R     08/23/18 0817   08/23/18 0608  Blood culture (routine x 2)  BLOOD CULTURE X 2,   STAT     08/23/18 0607   08/23/18 0106  CBC with Differential/Platelet  Once,   R     08/23/18 0106   Signed and Held  CBC  Tomorrow morning,   R     Signed and Held   Signed and Held  Basic metabolic panel  Tomorrow morning,   R     Signed and Held          Vitals/Pain Today's Vitals   08/23/18 0900 08/23/18 1030 08/23/18 1130  08/23/18 1625  BP: (!) 155/79 (!) 163/85 (!) 159/89 109/62  Pulse: 65 68 67 88  Resp: 15 16 17  (!) 22  Temp:      TempSrc:      SpO2: 98% 98% 95% 95%  Weight:      Height:      PainSc:        Isolation Precautions Droplet and Contact precautions  Medications Medications  furosemide (LASIX) injection 40 mg (40 mg Intravenous Given 08/23/18 0904)  potassium chloride SA (K-DUR) CR tablet 40 mEq (40 mEq Oral Given 08/23/18 0903)  Ampicillin-Sulbactam (UNASYN) 3 g in sodium chloride 0.9 % 100 mL IVPB (3 g Intravenous New Bag/Given 08/23/18 1615)  warfarin (COUMADIN) tablet 2.5 mg (has no administration in time range)  Warfarin - Pharmacist Dosing Inpatient (has no administration in time range)  sodium chloride (PF) 0.9 % injection (  Given by Other 08/23/18 0508)  iohexol (OMNIPAQUE) 350 MG/ML injection 100 mL (100 mLs Intravenous Contrast Given 08/23/18 0444)  vancomycin (VANCOCIN) IVPB 1000 mg/200 mL premix (0 mg Intravenous Stopped 08/23/18 0837)    Mobility walks

## 2018-08-23 NOTE — ED Provider Notes (Addendum)
[facility] DEPT Provider Note   CSN: [id number] Arrival date & time: 08/23/18  0003    History   Chief Complaint Chief Complaint  Patient presents with   Shortness of Breath    HPI Kristin Williams is a 73 y.o. female.     The history is provided by the patient.  Shortness of Breath  Severity:  Moderate Onset quality:  Sudden Duration:  4 hours Timing:  Constant Progression:  Unchanged Chronicity:  Recurrent Context: not activity, not URI and not weather changes   Relieved by:  Nothing Worsened by:  Nothing Ineffective treatments:  None tried Associated symptoms: no chest pain, no fever, no neck pain, no sore throat and no vomiting   Associated symptoms comment:  Chest congestion.  Missed a dose of lasix Risk factors: hx of cancer   Risk factors: no recent alcohol use   Patient with h/o esophageal cancer currently getting infusion of chemo through her port a cath who reports chest congestion and feeling like she had something thick that she had to get out.  She thought she might be in CHF and presents for evaluation of SOB.  No f/c/r.  No leg swelling.  No sore throat no diarrhea or anosmia.    Past Medical History:  Diagnosis Date   Arthritis    Cancer ([place])    Cervical   Cancer ([place])    Breast   Cancer ([place])    Vaginal   Complication of anesthesia    COPD (chronic obstructive pulmonary disease) ([place])    Headache(784.0)    History of radiation therapy 02/28/19- 04/07/18   Esophagus, 1.8 Gy in 28 fractions for a total dose of 50.4 Gy.    History of radiation therapy 02/03/18- 02/08/18   Brain Rt Sup Cerebellum// 6 FFF photons.    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/2018   Goals of care, counseling/discussion 02/17/2018   Metastasis to brain ([place]) 01/30/2018   Esophageal cancer, stage IV ([place]) 01/30/2018   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, estrogen receptor positive ([place]) 01/30/2018   Hypertension, essential 01/30/2018   Hyperlipidemia 01/30/2018   Tobacco use disorder 01/30/2018    Metastatic brain hemorrhage 01/27/2018   Vasogenic cerebral edema ([place]) 01/27/2018   Aftercare following surgery of the circulatory system, NEC 12/06/2012   Iliac artery stenosis, right ([place]) 12/01/2011    Past Surgical History:  Procedure Laterality Date   ABDOMINAL HYSTERECTOMY  1975   BREAST LUMPECTOMY  2005   GANGLION CYST EXCISION  1977   ILIAC ARTERY STENT  04/20/2004   CSD right iliac occlusive disease   IR IMAGING GUIDED PORT INSERTION  02/22/2018     OB History   No obstetric history on file.      Home Medications    Prior to Admission medications   Medication Sig Start Date End Date Taking? Authorizing Provider  albuterol (PROVENTIL HFA;VENTOLIN HFA) 108 (90 Base) MCG/ACT inhaler Inhale 2  puffs into the lungs every 6 (six) hours as needed for wheezing or shortness of breath.   Yes [provider]  anastrozole (ARIMIDEX) 1 MG tablet Take 1 tablet by mouth once daily 08/21/18  Yes [name], MD  atorvastatin (LIPITOR) 20 MG tablet Take 20 mg by mouth daily. 07/11/18  Yes [provider]  cyclobenzaprine (FLEXERIL) 5 MG tablet Take 1 tablet (5 mg total) by mouth 3 (three) times daily as needed for muscle spasms. 08/14/18  Yes Alla Feeling, NP  dexamethasone (DECADRON) 4 MG tablet Take 4mg  daily in the AM 06/01/18  Yes Vaslow, Acey Lav, MD  furosemide (LASIX) 20 MG  tablet TAKE 1 TABLET BY MOUTH EVERY OTHER DAY 06/28/18  Yes [name], MD  HYDROcodone-acetaminophen (NORCO) 7.5-325 MG tablet Take 1 tablet by mouth every 6 (six) hours as needed. 08/14/18  Yes Alla Feeling, NP  letrozole [facility]) 2.5 MG tablet Take 1 tablet (2.5 mg total) by mouth daily. 06/26/18  Yes [name], MD  lidocaine (XYLOCAINE) 2 % solution MIX 1 PART 2% VISCOUS LIDOCAINE, 1 PART H20 SWALLOW 10ML OF DILUTED MIXTURE, 20 MIN BEFORE MEALS AND AT BEDTIME UP TO 3 TIMES DAILY 06/26/18  Yes [name], MD  lidocaine-prilocaine (EMLA) cream Apply 1 application topically as needed. 02/27/18  Yes Curcio, Roselie Awkward, NP  LORazepam (ATIVAN) 1 MG tablet Take 0.5 tablets (0.5 mg total) by mouth 3 (three) times daily as needed for anxiety or sleep. 08/03/18  Yes Alla Feeling, NP  magic mouthwash SOLN Take 5 mLs by mouth 4 (four) times daily. 07/03/18  Yes [name], MD  metoCLOPramide (REGLAN) 10 MG tablet Take 1 tablet (10 mg total) by mouth every 6 (six) hours as needed. for nausea 06/26/18  Yes [name], MD  omeprazole (PRILOSEC) 40 MG capsule Take 40 mg by mouth daily. 05/17/18  Yes [provider]  sucralfate (CARAFATE) 1 g tablet DISSOLVE ONE TABLET IN 10 MLS OF WATER AND SWALLOW UP TO FOUR TIMES DAILY TO SOOTHE THROAT 08/14/18  Yes [name], MD  traZODone (DESYREL) 50 MG tablet Take 1 tablet (50 mg total) by mouth at bedtime. Patient taking differently: Take 50 mg by mouth at bedtime as needed for sleep.  08/03/18  Yes Alla Feeling, NP  nystatin (MYCOSTATIN) 100000 UNIT/ML suspension TAKE 5ML BY MOUTH 4 TIMES DAILY 08/03/18   Alla Feeling, NP  ondansetron (ZOFRAN) 8 MG tablet Take 1 tablet (8 mg total) by mouth every 8 (eight) hours as needed for nausea or vomiting. 02/27/18   [name], MD  warfarin (COUMADIN) 5 MG tablet Take 1 tablet (5 mg total) by mouth daily. 05/09/18   [name], MD    Family History Family History  Problem Relation Age of Onset   Cancer Mother 67        Non-Hodgkins Disease T-Cell   Heart failure Father    COPD Father    Heart disease Brother 70       MI and Heart Disease before age 35   Heart attack Brother    Lung cancer Maternal Uncle    Cancer Cousin    Prostate cancer Maternal Uncle    Prostate cancer Brother        half brother   Breast cancer Neg Hx     Social History Social History   Tobacco Use   Smoking status: Former Smoker    Packs/day: 0.50    Years: 40.00    Pack years:  20.00    Types: Cigarettes    Last attempt to quit: 01/27/2018    Years since quitting: 0.5   Smokeless tobacco: Never Used   Tobacco comment: She admits to smoking a few puffs daily- 05/02/18  Substance Use Topics   Alcohol use: No   Drug use: No     Allergies   Flonase [fluticasone propionate]   Review of Systems Review of Systems  Constitutional: Negative for fever.  HENT: Negative for sore throat.   Respiratory: Positive for shortness of breath.   Cardiovascular: Negative for chest pain, palpitations and leg swelling.  Gastrointestinal: Negative for nausea and vomiting.  Genitourinary: Negative for dysuria.  Musculoskeletal: Negative for neck pain.  Neurological: Negative for dizziness.  All other systems reviewed and are negative.    Physical Exam Updated Vital Signs BP (!) 146/68    Pulse 67    Temp 97.7 F (36.5 C) (Oral)    Resp 15    Ht 5' (1.524 m)    Wt 86.2 kg    LMP 02/17/2018    SpO2 98%    BMI 37.11 kg/m   Physical Exam Vitals signs and nursing note reviewed.  Constitutional:      General: She is not in acute distress.    Appearance: Normal appearance. She is obese.  HENT:     Head: Normocephalic and atraumatic.     Nose: Nose normal.  Eyes:     Conjunctiva/sclera: Conjunctivae normal.     Pupils: Pupils are equal, round, and reactive to light.  Neck:     Musculoskeletal: Normal range of motion and neck supple.  Cardiovascular:     Rate and Rhythm: Normal rate and regular rhythm.      Pulses: Normal pulses.     Heart sounds: Normal heart sounds.  Pulmonary:     Effort: Pulmonary effort is normal.     Breath sounds: No stridor. Decreased breath sounds present. No wheezing.  Abdominal:     General: Abdomen is flat. Bowel sounds are normal.     Tenderness: There is no abdominal tenderness.  Musculoskeletal: Normal range of motion.  Skin:    General: Skin is warm and dry.     Capillary Refill: Capillary refill takes less than 2 seconds.  Neurological:     General: No focal deficit present.     Mental Status: She is alert and oriented to person, place, and time.  Psychiatric:        Mood and Affect: Mood normal.        Behavior: Behavior normal.      ED Treatments / Results  Labs (all labs ordered are listed, but only abnormal results are displayed) Results for orders placed or performed during the hospital encounter of 16/10/96  Basic metabolic panel  Result Value Ref Range   Sodium 130 (L) 135 - 145 mmol/L   Potassium 3.7 3.5 - 5.1 mmol/L   Chloride 98 98 - 111 mmol/L   CO2 21 (L) 22 - 32 mmol/L   Glucose, Bld 94 70 - 99 mg/dL   BUN 19 8 - 23 mg/dL   Creatinine, Ser 0.75 0.44 - 1.00 mg/dL   Calcium 8.5 (L) 8.9 - 10.3 mg/dL   GFR calc non Af Amer >60 >60 mL/min   GFR calc Af Amer >60 >60 mL/min   Anion gap 11 5 - 15  Troponin I - ONCE - STAT  Result Value Ref Range   Troponin I <0.03 <0.03 ng/mL  Brain natriuretic  peptide  Result Value Ref Range   B Natriuretic Peptide 219.1 (H) 0.0 - 100.0 pg/mL  CBC with Differential/Platelet  Result Value Ref Range   WBC 8.4 4.0 - 10.5 K/uL   RBC 2.96 (L) 3.87 - 5.11 MIL/uL   Hemoglobin 9.8 (L) 12.0 - 15.0 g/dL   HCT 30.5 (L) 36.0 - 46.0 %   MCV 103.0 (H) 80.0 - 100.0 fL   MCH 33.1 26.0 - 34.0 pg   MCHC 32.1 30.0 - 36.0 g/dL   RDW 16.6 (H) 11.5 - 15.5 %   Platelets 147 (L) 150 - 400 K/uL   nRBC 0.2 0.0 - 0.2 %   Neutrophils Relative % 92 %   Neutro Abs 7.7 1.7 - 7.7 K/uL   Lymphocytes Relative 2 %    Lymphs Abs 0.2 (L) 0.7 - 4.0 K/uL   Monocytes Relative 5 %   Monocytes Absolute 0.4 0.1 - 1.0 K/uL   Eosinophils Relative 0 %   Eosinophils Absolute 0.0 0.0 - 0.5 K/uL   Basophils Relative 0 %   Basophils Absolute 0.0 0.0 - 0.1 K/uL   Immature Granulocytes 1 %   Abs Immature Granulocytes 0.05 0.00 - 0.07 K/uL   Ct Angio Chest Pe W And/or Wo Contrast  Result Date: 08/23/2018 CLINICAL DATA:  Hypoxia and shortness of breath. Personal history of esophageal cancer treated with radiation. Personal history of breast cancer. Personal history of brain metastases treated with radiation. Initial encounter. EXAM: CT ANGIOGRAPHY CHEST WITH CONTRAST TECHNIQUE: Multidetector CT imaging of the chest was performed using the standard protocol during bolus administration of intravenous contrast. Multiplanar CT image reconstructions and MIPs were obtained to evaluate the vascular anatomy. CONTRAST:  137mL OMNIPAQUE IOHEXOL 350 MG/ML SOLN COMPARISON:  One-view chest x-ray 08/23/2018 CT of the chest 01/26/2018. PET scan 02/13/2018 FINDINGS: Cardiovascular: Heart size upper limits of normal. Atherosclerotic changes are noted at the aortic arch. Calcifications are present at the origins the great vessels without focal stenosis. There is no aneurysm. Pulmonary artery opacification is excellent. No focal filling defects are present to suggest pulmonary emboli. Mediastinum/Nodes: No significant mediastinal, hilar or axillary adenopathy is present. There is moderate distal esophageal wall thickening circumferentially. Bulky mass lesion previously noted is no longer discretely evident. Esophageal thickening extends into the stomach. Lungs/Pleura: The right middle lobe pulmonary nodule measuring 12 mm is stable. Linear opacities at the right base likely reflect atelectasis. Infection is considered less likely. The left lung is clear. No significant effusions are present. There is no pneumothorax. Azygos fissure is noted. Upper  Abdomen: Visualized upper abdomen is within normal limits. Musculoskeletal: T3, T8, T10, and T11 compression fractures are stable. There is no progression of the fractures or retropulsed bone. No focal lytic or blastic lesions are present. Review of the MIP images confirms the above findings. IMPRESSION: 1. No pulmonary embolus. 2. Linear densities at the right base likely reflect atelectasis. Infection is considered less likely. 3. No other acute or focal abnormality to explain shortness of breath or hypoxia. 4. Residual circumferential distal esophageal thickening is less prominent than on the prior exam. No discrete mass lesion is evident. 5. Stable remote thoracic compression fractures. Electronically Signed   By: San Morelle M.D.   On: 08/23/2018 05:30   Mr Thoracic Spine W Wo Contrast  Result Date: 08/11/2018 CLINICAL DATA:  History of cancer of the cervix, breast, vagina, esophagus. Acute back pain. Rule out metastatic disease. EXAM: MRI THORACIC AND LUMBAR SPINE WITHOUT AND WITH CONTRAST TECHNIQUE: Multiplanar  and multiecho pulse sequences of the thoracic and lumbar spine were obtained without and with intravenous contrast. CONTRAST:  10 mL Gadovist IV COMPARISON:  MRI lumbar spine 04/29/2010.  PET-CT 02/13/2018 FINDINGS: MRI THORACIC SPINE FINDINGS Alignment:  Normal Vertebrae: Mild compression fracture T3 vertebral body with slight enhancement. This likely is a healing benign fracture. No definite mass lesion. Mild compression fracture T8 with mild bone marrow edema. No enhancing mass lesion. Mild compression fracture T11 with slight edema. Probable healing fracture. No underlying mass lesion. Fatty changes in the bone marrow throughout the thoracic spine due to prior radiation. Cord:  Negative for cord compression.  Cord signal normal throughout Paraspinal and other soft tissues: Negative for paraspinous mass. No enlarged lymph nodes or pleural effusion. Disc levels: Negative for disc  protrusion. Mild disc degeneration T11-12 with mild disc bulging. No significant spinal stenosis. MRI LUMBAR SPINE FINDINGS Segmentation:  Normal Alignment: Mild retrolisthesis L1-2. Mild anterolisthesis L2-3. 8 mm anterolisthesis L3-4. 5 mm anterolisthesis L4-5. Vertebrae: Negative for lumbar fracture or mass. Mild fracture T11 as described above. No evidence of metastatic disease lumbar spine. Conus medullaris: Extends to the L1-2 level and appears normal. Paraspinal and other soft tissues: Negative for paraspinous mass or adenopathy. Disc levels: L1-2: Mild disc and mild facet degeneration.  Negative for stenosis L2-3: Mild anterolisthesis. Shallow right-sided disc protrusion. Advanced facet degeneration. Mild spinal stenosis and moderate subarticular stenosis on the right. L3-4: 8 mm anterolisthesis with severe facet degeneration. Severe spinal stenosis and severe subarticular stenosis bilaterally L4-5: 5 mm anterolisthesis with moderate spinal stenosis. Advanced facet degeneration bilaterally. Moderate subarticular stenosis bilaterally L5-S1: Moderate facet degeneration bilaterally with mild subarticular stenosis bilaterally. IMPRESSION: 1. Radiation changes in the thoracic spine. Compression fractures T3, T8, and T11 appear benign. Mild enhancement at T3 and mild edema T11 suggest healing fractures. Negative for cord compression or spinal stenosis in the thoracic spine 2. Negative for lumbar fracture or metastatic disease 3. Lumbar degenerative changes at multiple levels. Severe spinal stenosis at L3-4 and moderate spinal stenosis L4-5. Electronically Signed   By: Franchot Gallo M.D.   On: 08/11/2018 11:03   Mr Lumbar Spine W Wo Contrast  Result Date: 08/11/2018 CLINICAL DATA:  History of cancer of the cervix, breast, vagina, esophagus. Acute back pain. Rule out metastatic disease. EXAM: MRI THORACIC AND LUMBAR SPINE WITHOUT AND WITH CONTRAST TECHNIQUE: Multiplanar and multiecho pulse sequences of the  thoracic and lumbar spine were obtained without and with intravenous contrast. CONTRAST:  10 mL Gadovist IV COMPARISON:  MRI lumbar spine 04/29/2010.  PET-CT 02/13/2018 FINDINGS: MRI THORACIC SPINE FINDINGS Alignment:  Normal Vertebrae: Mild compression fracture T3 vertebral body with slight enhancement. This likely is a healing benign fracture. No definite mass lesion. Mild compression fracture T8 with mild bone marrow edema. No enhancing mass lesion. Mild compression fracture T11 with slight edema. Probable healing fracture. No underlying mass lesion. Fatty changes in the bone marrow throughout the thoracic spine due to prior radiation. Cord:  Negative for cord compression.  Cord signal normal throughout Paraspinal and other soft tissues: Negative for paraspinous mass. No enlarged lymph nodes or pleural effusion. Disc levels: Negative for disc protrusion. Mild disc degeneration T11-12 with mild disc bulging. No significant spinal stenosis. MRI LUMBAR SPINE FINDINGS Segmentation:  Normal Alignment: Mild retrolisthesis L1-2. Mild anterolisthesis L2-3. 8 mm anterolisthesis L3-4. 5 mm anterolisthesis L4-5. Vertebrae: Negative for lumbar fracture or mass. Mild fracture T11 as described above. No evidence of metastatic disease lumbar spine. Conus medullaris: Extends to  the L1-2 level and appears normal. Paraspinal and other soft tissues: Negative for paraspinous mass or adenopathy. Disc levels: L1-2: Mild disc and mild facet degeneration.  Negative for stenosis L2-3: Mild anterolisthesis. Shallow right-sided disc protrusion. Advanced facet degeneration. Mild spinal stenosis and moderate subarticular stenosis on the right. L3-4: 8 mm anterolisthesis with severe facet degeneration. Severe spinal stenosis and severe subarticular stenosis bilaterally L4-5: 5 mm anterolisthesis with moderate spinal stenosis. Advanced facet degeneration bilaterally. Moderate subarticular stenosis bilaterally L5-S1: Moderate facet degeneration  bilaterally with mild subarticular stenosis bilaterally. IMPRESSION: 1. Radiation changes in the thoracic spine. Compression fractures T3, T8, and T11 appear benign. Mild enhancement at T3 and mild edema T11 suggest healing fractures. Negative for cord compression or spinal stenosis in the thoracic spine 2. Negative for lumbar fracture or metastatic disease 3. Lumbar degenerative changes at multiple levels. Severe spinal stenosis at L3-4 and moderate spinal stenosis L4-5. Electronically Signed   By: Franchot Gallo M.D.   On: 08/11/2018 11:03   Dg Chest Portable 1 View  Result Date: 08/23/2018 CLINICAL DATA:  Shortness of breath EXAM: PORTABLE CHEST 1 VIEW COMPARISON:  Chest x-ray dated 04/09/2018. FINDINGS: There is a well-positioned right-sided Port-A-Cath. The heart size is enlarged. There are bibasilar airspace opacities. There is an azygos lobe. There is no pneumothorax. There is some mild blunting of the costophrenic angles bilaterally. There is hyperexpansion of the lungs. Aortic calcifications are noted. There is a stable calcification projecting over the left lower lobe, likely related to the patient's breast tissue. Previously noted nodular opacity in the right middle lobe is not well appreciated. IMPRESSION: 1. Well-positioned right-sided Port-A-Cath. 2. Mild cardiomegaly. 3. New right lower lobe airspace opacity which may represent atelectasis or infiltrate. Electronically Signed   By: Constance Holster M.D.   On: 08/23/2018 02:25   ] EKG  EKG Interpretation  Date/Time:  Wednesday August 23 2018 00:26:04 EDT Ventricular Rate:  69 PR Interval:    QRS Duration: 83 QT Interval:  410 QTC Calculation: 440 R Axis:   -8 Text Interpretation:  Sinus rhythm Ventricular premature complex Abnormal R-wave progression, early transition Confirmed by Randal Buba, Akita Maxim ([id number]) on 08/23/2018 1:18:25 AM        Procedures Procedures (including critical care time)  Medications Ordered in ED Medications    vancomycin (VANCOCIN) IVPB 1000 mg/200 mL premix (has no administration in time range)  sodium chloride (PF) 0.9 % injection (  Given by Other 08/23/18 0508)  iohexol (OMNIPAQUE) 350 MG/ML injection 100 mL (100 mLs Intravenous Contrast Given 08/23/18 0444)      Final Clinical Impressions(s) / ED Diagnoses    I suspect that the patient has an early infection in the chest. Covid is pending but given ongoing chemo and visits to the cancer center the patient must be treated as HCAP and hospitalist team was consulted.     Vernice Mannina, MD 08/23/18 [id number]

## 2018-08-23 NOTE — ED Notes (Signed)
[facility] about stopping chemo infusion at 1230. Spoke with [employer] who said he would send someone down to D/C chemo.

## 2018-08-23 NOTE — H&P (Addendum)
History and Physical    Kristin Williams NWG:[id number] DOB: January 28, 1946 DOA: 08/23/2018  Referring MD/NP/PA: EDP Patient coming from: home  Chief Complaint: Shortness of breath  HPI: SISTER CARBONE is a 73 y.o. 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 -Patient reports being in her usual state of health, she is on chemotherapy followed by Dr. Burr Medico, has been tolerating this relatively well, last night noticed sudden onset of worsening shortness of breath, associated with a cough productive of whitish sputum, reports feeling unable to catch her breath for short while, called 911 and came to the emergency room.  She feels better since she has been here.  Also has had longstanding lower extremity edema for last 1 to 2 months, denies any overt orthopnea or PND.  No history of fevers or chills ED Course: On arrival O2 sats were 87% on room air, went up to 97% on 3 L, labs were unremarkable, CT angiogram of the chest was negative for pulmonary embolism showed right lower lobe atelectasis and no other acute findings  Review of Systems: As per HPI otherwise 14 point review of systems negative.   Past Medical History:  Diagnosis Date  . Arthritis   . Cancer (HCC)    Cervical  . Cancer ([place])    Breast  . Cancer ([place])    Vaginal  . Complication of anesthesia   . COPD (chronic obstructive pulmonary disease) ([place])   . Headache(784.0)   . History of radiation therapy 02/28/19- 04/07/18   Esophagus, 1.8 Gy in 28 fractions for a total dose of 50.4 Gy.   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   . Hyperlipidemia   . Leg pain   . Peripheral vascular disease ([place])   . Personal history of chemotherapy   . Personal history of radiation therapy   .  PONV (postoperative nausea and vomiting)     Past Surgical History:  Procedure Laterality Date  . ABDOMINAL HYSTERECTOMY  1975  . BREAST LUMPECTOMY  2005  . GANGLION CYST EXCISION  1977  . ILIAC ARTERY STENT  04/20/2004   CSD right iliac occlusive disease  . IR IMAGING GUIDED PORT INSERTION  02/22/2018     reports that she quit smoking about 6 months ago. Her smoking use included cigarettes. She has a 20.00 pack-year smoking history. She has never used smokeless tobacco. She reports that she does not drink alcohol or use drugs.  Allergies  Allergen Reactions  . Flonase [Fluticasone Propionate] Other (See Comments)    Nose bleeds    Family History  Problem Relation Age of Onset  . Cancer Mother 71       Non-Hodgkins Disease T-Cell  . Heart failure Father   . COPD Father   . Heart disease Brother 87       MI and Heart Disease before age 91  . Heart attack Brother   . Lung cancer Maternal Uncle   . Cancer Cousin   . Prostate cancer Maternal Uncle   . Prostate cancer Brother        half brother  . Breast cancer Neg Hx      Prior to Admission medications   Medication Sig Start Date End Date Taking? Authorizing Provider  albuterol (PROVENTIL HFA;VENTOLIN HFA) 108 (  90 Base) MCG/ACT inhaler Inhale 2 puffs into the lungs every 6 (six) hours as needed for wheezing or shortness of breath.   Yes [provider]  anastrozole (ARIMIDEX) 1 MG tablet Take 1 tablet by mouth once daily 08/21/18  Yes [name], MD  atorvastatin (LIPITOR) 20 MG tablet Take 20 mg by mouth daily. 07/11/18  Yes [provider]  cyclobenzaprine (FLEXERIL) 5 MG tablet Take 1 tablet (5 mg total) by mouth 3 (three) times daily as needed for muscle spasms. 08/14/18  Yes Alla Feeling, NP  dexamethasone (DECADRON) 4 MG tablet Take 4mg  daily in the AM 06/01/18  Yes Vaslow, Acey Lav, MD  furosemide (LASIX) 20 MG tablet TAKE 1 TABLET BY MOUTH EVERY OTHER DAY Patient taking differently: Take 20 mg by  mouth every other day.  06/28/18  Yes [name], MD  HYDROcodone-acetaminophen (NORCO) 7.5-325 MG tablet Take 1 tablet by mouth every 6 (six) hours as needed. Patient taking differently: Take 1 tablet by mouth every 6 (six) hours as needed for moderate pain.  08/14/18  Yes Alla Feeling, NP  letrozole [facility]) 2.5 MG tablet Take 1 tablet (2.5 mg total) by mouth daily. 06/26/18  Yes [name], MD  lidocaine (XYLOCAINE) 2 % solution MIX 1 PART 2% VISCOUS LIDOCAINE, 1 PART H20 SWALLOW 10ML OF DILUTED MIXTURE, 20 MIN BEFORE MEALS AND AT BEDTIME UP TO 3 TIMES DAILY 06/26/18  Yes [name], MD  lidocaine-prilocaine (EMLA) cream Apply 1 application topically as needed. Patient taking differently: Apply 1 application topically as needed (port).  02/27/18  Yes Curcio, Roselie Awkward, NP  LORazepam (ATIVAN) 1 MG tablet Take 0.5 tablets (0.5 mg total) by mouth 3 (three) times daily as needed for anxiety or sleep. 08/03/18  Yes Alla Feeling, NP  magic mouthwash SOLN Take 5 mLs by mouth 4 (four) times daily. 07/03/18  Yes [name], MD  metoCLOPramide (REGLAN) 10 MG tablet Take 1 tablet (10 mg total) by mouth every 6 (six) hours as needed. for nausea 06/26/18  Yes [name], MD  omeprazole (PRILOSEC) 40 MG capsule Take 40 mg by mouth daily. 05/17/18  Yes [provider]  sucralfate (CARAFATE) 1 g tablet DISSOLVE ONE TABLET IN 10 MLS OF WATER AND SWALLOW UP TO FOUR TIMES DAILY TO SOOTHE THROAT Patient taking differently: Take 1 g by mouth See admin instructions. DISSOLVE ONE TABLET IN 10 MLS OF WATER AND SWALLOW UP TO FOUR TIMES DAILY TO SOOTHE THROAT 08/14/18  Yes [name], MD  traZODone (DESYREL) 50 MG tablet Take 1 tablet (50 mg total) by mouth at bedtime. Patient taking differently: Take 50 mg by mouth at bedtime as needed for sleep.  08/03/18  Yes Alla Feeling, NP  warfarin (COUMADIN) 5 MG tablet Take 1 tablet (5 mg total) by mouth daily. Patient taking differently: Take 2.5-5 mg by mouth See admin  instructions. 2.5 MG on Monday, Wednesday and Saturday 5 Mg on Sunday, Tuesday, Thursday and Friday 05/09/18  Yes [name], MD  nystatin (MYCOSTATIN) 100000 UNIT/ML suspension TAKE 5ML BY MOUTH 4 TIMES DAILY Patient not taking: Reported on 08/23/2018 08/03/18   Alla Feeling, NP  ondansetron (ZOFRAN) 8 MG tablet Take 1 tablet (8 mg total) by mouth every 8 (eight) hours as needed for nausea or vomiting. Patient not taking: Reported on 08/23/2018 02/27/18   [name], MD    Physical Exam: Vitals:   08/23/18 0200 08/23/18 0408 08/23/18 0530 08/23/18 0615  BP: 118/76 140/76 Marland Kitchen)  149/75 (!) 156/88  Pulse: 64 70 64 (!) 108  Resp: 20 17  17   Temp:      TempSrc:      SpO2: 100% 98% 98% 96%  Weight:      Height:          Constitutional: NAD, calm, comfortable sitting up in bed, no distress Vitals:   08/23/18 0200 08/23/18 0408 08/23/18 0530 08/23/18 0615  BP: 118/76 140/76 (!) 149/75 (!) 156/88  Pulse: 64 70 64 (!) 108  Resp: 20 17  17   Temp:      TempSrc:      SpO2: 100% 98% 98% 96%  Weight:      Height:       Eyes: PERRL, lids and conjunctivae normal ENMT: Mucous membranes are moist.  Neck: Neck obese, unable to assess JVD Respiratory: Distant breath sounds, poor air movement, bronchial breath sounds at bases, Chemo-Port noted site appears unremarkable Cardiovascular: S1-S2/regular rate rhythm Abdomen: soft, non tender, Bowel sounds positive.  Musculoskeletal: No joint deformity upper and lower extremities. Ext: 1+ edema Skin: no rashes, lesions, ulcers.  Neurologic: Moves all extremities, no localizing signs Psychiatric: Normal judgment and insight. Alert and oriented x 3. Normal mood.   Labs on Admission: I have personally reviewed following labs and imaging studies  CBC: Recent Labs  Lab 08/21/18 0856 08/23/18 0232  WBC 8.6 8.4  NEUTROABS 7.6 7.7  HGB 10.2* 9.8*  HCT 32.1* 30.5*  MCV 104.9* 103.0*  PLT 153 347*   Basic Metabolic Panel: Recent Labs  Lab  08/21/18 0856 08/23/18 0157  NA 138 130*  K 3.7 3.7  CL 104 98  CO2 24 21*  GLUCOSE 85 94  BUN 11 19  CREATININE 0.95 0.75  CALCIUM 8.6* 8.5*   GFR: Estimated Creatinine Clearance: 62.9 mL/min (by C-G formula based on SCr of 0.75 mg/dL). Liver Function Tests: Recent Labs  Lab 08/21/18 0856  AST 14*  ALT 18  ALKPHOS 150*  BILITOT 0.5  PROT 5.7*  ALBUMIN 2.9*   No results for input(s): LIPASE, AMYLASE in the last 168 hours. No results for input(s): AMMONIA in the last 168 hours. Coagulation Profile: Recent Labs  Lab 08/21/18 0856  INR 2.0*   Cardiac Enzymes: Recent Labs  Lab 08/23/18 0157  TROPONINI <0.03   BNP (last 3 results) No results for input(s): PROBNP in the last 8760 hours. HbA1C: No results for input(s): HGBA1C in the last 72 hours. CBG: No results for input(s): GLUCAP in the last 168 hours. Lipid Profile: No results for input(s): CHOL, HDL, LDLCALC, TRIG, CHOLHDL, LDLDIRECT in the last 72 hours. Thyroid Function Tests: No results for input(s): TSH, T4TOTAL, FREET4, T3FREE, THYROIDAB in the last 72 hours. Anemia Panel: No results for input(s): VITAMINB12, FOLATE, FERRITIN, TIBC, IRON, RETICCTPCT in the last 72 hours. Urine analysis:    Component Value Date/Time   COLORURINE STRAW (A) 05/30/2018 1354   APPEARANCEUR CLEAR 05/30/2018 1354   LABSPEC 1.003 (L) 05/30/2018 1354   PHURINE 7.0 05/30/2018 1354   GLUCOSEU NEGATIVE 05/30/2018 1354   HGBUR SMALL (A) 05/30/2018 1354   BILIRUBINUR NEGATIVE 05/30/2018 1354   KETONESUR NEGATIVE 05/30/2018 1354   PROTEINUR NEGATIVE 05/30/2018 1354   NITRITE NEGATIVE 05/30/2018 1354   LEUKOCYTESUR TRACE (A) 05/30/2018 1354   Sepsis Labs: @LABRCNTIP (procalcitonin:4,lacticidven:4) ) Recent Results (from the past 240 hour(s))  SARS Coronavirus 2 (CEPHEID- Performed in [place] hospital lab), Hosp Order     Status: None   Collection Time: 08/23/18  5:57  AM  Result Value Ref Range Status   SARS Coronavirus  2 NEGATIVE NEGATIVE Final    Comment: (NOTE) If result is NEGATIVE SARS-CoV-2 target nucleic acids are NOT DETECTED. The SARS-CoV-2 RNA is generally detectable in upper and lower  respiratory specimens during the acute phase of infection. The lowest  concentration of SARS-CoV-2 viral copies this assay can detect is 250  copies / mL. A negative result does not preclude SARS-CoV-2 infection  and should not be used as the sole basis for treatment or other  patient management decisions.  A negative result may occur with  improper specimen collection / handling, submission of specimen other  than nasopharyngeal swab, presence of viral mutation(s) within the  areas targeted by this assay, and inadequate number of viral copies  (<250 copies / mL). A negative result must be combined with clinical  observations, patient history, and epidemiological information. If result is POSITIVE SARS-CoV-2 target nucleic acids are DETECTED. The SARS-CoV-2 RNA is generally detectable in upper and lower  respiratory specimens dur ing the acute phase of infection.  Positive  results are indicative of active infection with SARS-CoV-2.  Clinical  correlation with patient history and other diagnostic information is  necessary to determine patient infection status.  Positive results do  not rule out bacterial infection or co-infection with other viruses. If result is PRESUMPTIVE POSTIVE SARS-CoV-2 nucleic acids MAY BE PRESENT.   A presumptive positive result was obtained on the submitted specimen  and confirmed on repeat testing.  While 2019 novel coronavirus  (SARS-CoV-2) nucleic acids may be present in the submitted sample  additional confirmatory testing may be necessary for epidemiological  and / or clinical management purposes  to differentiate between  SARS-CoV-2 and other Sarbecovirus currently known to infect humans.  If clinically indicated additional testing with an alternate test  methodology  [phone]) is advised. The SARS-CoV-2 RNA is generally  detectable in upper and lower respiratory sp ecimens during the acute  phase of infection. The expected result is Negative. Fact Sheet for Patients:  StrictlyIdeas.no Fact Sheet for Healthcare Providers: BankingDealers.co.za This test is not yet approved or cleared by the [place] FDA and has been authorized for detection and/or diagnosis of SARS-CoV-2 by FDA under an Emergency Use Authorization (EUA).  This EUA will remain in effect (meaning this test can be used) for the duration of the COVID-19 declaration under Section 564(b)(1) of the Act, 21 U.S.C. section 360bbb-3(b)(1), unless the authorization is terminated or revoked sooner. Performed at [facility], [place] [address], [place], [place] [address]      Radiological Exams on Admission: Ct Angio Chest Pe W And/or Wo Contrast  Result Date: 08/23/2018 CLINICAL DATA:  Hypoxia and shortness of breath. Personal history of esophageal cancer treated with radiation. Personal history of breast cancer. Personal history of brain metastases treated with radiation. Initial encounter. EXAM: CT ANGIOGRAPHY CHEST WITH CONTRAST TECHNIQUE: Multidetector CT imaging of the chest was performed using the standard protocol during bolus administration of intravenous contrast. Multiplanar CT image reconstructions and MIPs were obtained to evaluate the vascular anatomy. CONTRAST:  155mL OMNIPAQUE IOHEXOL 350 MG/ML SOLN COMPARISON:  One-view chest x-ray 08/23/2018 CT of the chest 01/26/2018. PET scan 02/13/2018 FINDINGS: Cardiovascular: Heart size upper limits of normal. Atherosclerotic changes are noted at the aortic arch. Calcifications are present at the origins the great vessels without focal stenosis. There is no aneurysm. Pulmonary artery opacification is excellent. No focal filling defects are present to suggest pulmonary emboli.  Mediastinum/Nodes: No significant mediastinal, hilar or axillary adenopathy is present. There is moderate distal esophageal wall thickening circumferentially. Bulky mass lesion previously noted is no longer discretely evident. Esophageal thickening extends into the stomach. Lungs/Pleura: The right middle lobe pulmonary nodule measuring 12 mm is stable. Linear opacities at the right base likely reflect atelectasis. Infection is considered less likely. The left lung is clear. No significant effusions are present. There is no pneumothorax. Azygos fissure is noted. Upper Abdomen: Visualized upper abdomen is within normal limits. Musculoskeletal: T3, T8, T10, and T11 compression fractures are stable. There is no progression of the fractures or retropulsed bone. No focal lytic or blastic lesions are present. Review of the MIP images confirms the above findings. IMPRESSION: 1. No pulmonary embolus. 2. Linear densities at the right base likely reflect atelectasis. Infection is considered less likely. 3. No other acute or focal abnormality to explain shortness of breath or hypoxia. 4. Residual circumferential distal esophageal thickening is less prominent than on the prior exam. No discrete mass lesion is evident. 5. Stable remote thoracic compression fractures. Electronically Signed   By: San Morelle M.D.   On: 08/23/2018 05:30   Dg Chest Portable 1 View  Result Date: 08/23/2018 CLINICAL DATA:  Shortness of breath EXAM: PORTABLE CHEST 1 VIEW COMPARISON:  Chest x-ray dated 04/09/2018. FINDINGS: There is a well-positioned right-sided Port-A-Cath. The heart size is enlarged. There are bibasilar airspace opacities. There is an azygos lobe. There is no pneumothorax. There is some mild blunting of the costophrenic angles bilaterally. There is hyperexpansion of the lungs. Aortic calcifications are noted. There is a stable calcification projecting over the left lower lobe, likely related to the patient's breast tissue.  Previously noted nodular opacity in the right middle lobe is not well appreciated. IMPRESSION: 1. Well-positioned right-sided Port-A-Cath. 2. Mild cardiomegaly. 3. New right lower lobe airspace opacity which may represent atelectasis or infiltrate. Electronically Signed   By: Constance Holster M.D.   On: 08/23/2018 02:25    EKG: Independently reviewed her EKG, sinus rhythm, few PVCs, no acute ST-T wave changes  Assessment/Plan  Acute hypoxic respiratory failure -Suspect component of early aspiration pneumonia and likely acute diastolic CHF -Ongoing worsening dysphagia in the setting of esophageal CA, no fever or leukocytosis however she is immunocompromised on chemotherapy -Clinically also appears slightly volume overloaded -IV Unasyn, follow-up blood cultures, SLP evaluation, check procalcitonin, de-escalate antibiotics quickly if pneumonia appears less likely on reassessment -May need barium swallow -IV Lasix with potassium x2 doses now, last echo 11/201 9 noted EF of 55% and grade 1 diastolic dysfunction -SARS COVID-19 PCR is negative  Acute diastolic CHF -See above  Stage IV esophageal cancer with brain metastasis -Last chemo 6/8 received oxaliplatin 5-FU and leucovorin -Followed by Dr.Feng, will add her to Rx team -Status post XRT to solitary brain metastasis -Continue Decadron for this -For odynophagia she is on an extensive regimen of Carafate, Magic mouthwash, Zofran, as needed Ativan, Reglan which will be continued, also get SLP evaluation    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, estrogen receptor positive ([place]) -Continue Arimidex and Femara    DVT, lower extremity, distal, acute, left (HCC) -Continue Coumadin per pharmacy, INR therapeutic  History of low back pain, thoracic compression fractures -Continued on hydrocodone and Flexeril -Oncology plans to reassess this with a PET scan in few weeks  COPD -Appears stable, no wheezing at this time, monitor   DVT prophylaxis: Continue Coumadin per pharmacy Code Status: Discussed with patient she  wishes to be DNR Family Communication: No family at bedside, discussed with patient in detail Disposition Plan: Home likely in 2 to 3 days Consults called: None Admission status: Inpatient  Domenic Polite MD [facility]  08/23/2018, 8:32 AM

## 2018-08-23 NOTE — ED Notes (Signed)
IV team requested for second IV site to be able to draw blood cultures.

## 2018-08-23 NOTE — Progress Notes (Signed)
Pharmacy Antibiotic Note  NDEA KILROY is a 73 y.o. female admitted on 08/23/2018 with r/o PNA.  Pharmacy has been consulted for Cefepime dosing.  Plan: Cefepime 2gm q8h Vancomycin 1gm x1 per MD in ED  Height: 5' (152.4 cm) Weight: 190 lb (86.2 kg) IBW/kg (Calculated) : 45.5  Temp (24hrs), Avg:97.7 F (36.5 C), Min:97.7 F (36.5 C), Max:97.7 F (36.5 C)  Recent Labs  Lab 08/21/18 0856 08/23/18 0157 08/23/18 0232  WBC 8.6  --  8.4  CREATININE 0.95 0.75  --     Estimated Creatinine Clearance: 62.9 mL/min (by C-G formula based on SCr of 0.75 mg/dL).    Allergies  Allergen Reactions  . Flonase [Fluticasone Propionate] Other (See Comments)    Nose bleeds    Antimicrobials this admission: 6/10 Vancomycin x1 6/10 Cefepime >>   Dose adjustments this admission:  Microbiology results: BCx: ordered, need collection, verify time of Vancomycin if cx drawn Covid: ordered  Thank you for allowing pharmacy to be a part of this patient's care.  Minda Ditto PharmD Pager [phone] 08/23/2018, 6:19 AM

## 2018-08-23 NOTE — ED Notes (Signed)
Bed: WA17 Expected date:  Expected time:  Means of arrival:  Comments: [facility] at [facility]

## 2018-08-23 NOTE — Progress Notes (Deleted)
Per Charge Kerrin Champagne RN, ER called to ask if we can D/C a pump for [name] who is being admitted in the ER. Flushed port blood return noted heparin locked changed dressing and bio patch Pt. allergic to sorbaview dressing so Opsite dressing placed.

## 2018-08-23 NOTE — Progress Notes (Signed)
PIV consult: Discussed with RN, it is not best practice to start additional IVs for labs only. Pt has implanted port if another site is needed for infusion. Consult phlebotomy for lab draws.

## 2018-08-23 NOTE — ED Notes (Signed)
X-ray at bedside

## 2018-08-23 NOTE — ED Notes (Signed)
Pt ambulated from bedside commode to the bed and vitals were obtained. Pt complains of feeling short of breath during time of exertion.

## 2018-08-23 NOTE — ED Triage Notes (Signed)
Per EMS - Esophogeal CA pt, mets to brain and right breast. CC sob. Upon arrival of EMS sats 87%, once on 3L sats up to 97-100%. Pt states that it feels like her stomach is swelling, but has not taken her prescribed lasix today. Pt reports taking hydrocodone 7.5. A+Ox4. Ambulatory with walker. Pt has continuous chemo infusing currently through port.   137/80 83HR 18R 97% on 3L  97.8

## 2018-08-24 ENCOUNTER — Telehealth: Payer: Self-pay | Admitting: *Deleted

## 2018-08-24 DIAGNOSIS — J9621 Acute and chronic respiratory failure with hypoxia: Secondary | ICD-10-CM | POA: Diagnosis not present

## 2018-08-24 DIAGNOSIS — J441 Chronic obstructive pulmonary disease with (acute) exacerbation: Secondary | ICD-10-CM

## 2018-08-24 DIAGNOSIS — C159 Malignant neoplasm of esophagus, unspecified: Secondary | ICD-10-CM | POA: Diagnosis not present

## 2018-08-24 LAB — CBC
HCT: 33.2 % — ABNORMAL LOW (ref 36.0–46.0)
Hemoglobin: 10.8 g/dL — ABNORMAL LOW (ref 12.0–15.0)
MCH: 34 pg (ref 26.0–34.0)
MCHC: 32.5 g/dL (ref 30.0–36.0)
MCV: 104.4 fL — ABNORMAL HIGH (ref 80.0–100.0)
Platelets: 147 10*3/uL — ABNORMAL LOW (ref 150–400)
RBC: 3.18 MIL/uL — ABNORMAL LOW (ref 3.87–5.11)
RDW: 17.1 % — ABNORMAL HIGH (ref 11.5–15.5)
WBC: 6 10*3/uL (ref 4.0–10.5)
nRBC: 0 % (ref 0.0–0.2)

## 2018-08-24 LAB — BASIC METABOLIC PANEL
Anion gap: 10 (ref 5–15)
BUN: 18 mg/dL (ref 8–23)
CO2: 26 mmol/L (ref 22–32)
Calcium: 7.7 mg/dL — ABNORMAL LOW (ref 8.9–10.3)
Chloride: 96 mmol/L — ABNORMAL LOW (ref 98–111)
Creatinine, Ser: 0.76 mg/dL (ref 0.44–1.00)
GFR calc Af Amer: >60 mL/min (ref >60)
GFR calc non Af Amer: >60 mL/min (ref >60)
Glucose, Bld: 106 mg/dL — ABNORMAL HIGH (ref 70–99)
Potassium: 3.6 mmol/L (ref 3.5–5.1)
Sodium: 132 mmol/L — ABNORMAL LOW (ref 135–145)

## 2018-08-24 LAB — PROCALCITONIN: Procalcitonin: 0.13 ng/mL

## 2018-08-24 MED ORDER — METHYLPREDNISOLONE SODIUM SUCC 125 MG IJ SOLR: 125.0000 mg | Freq: Once | INTRAMUSCULAR | Status: DC

## 2018-08-24 MED ORDER — PREDNISONE 50 MG PO TABS: ORAL_TABLET | ORAL | 0 refills | Status: DC

## 2018-08-24 MED ORDER — TRAZODONE HCL 50 MG PO TABS: 50.0000 mg | ORAL_TABLET | Freq: Every evening | ORAL | Status: DC | PRN

## 2018-08-24 MED ORDER — SODIUM CHLORIDE 0.9% FLUSH
10.0000 mL | Freq: Two times a day (BID) | INTRAVENOUS | Status: DC
  Administered 2018-08-24: 10 mL

## 2018-08-24 MED ORDER — PREDNISONE 50 MG PO TABS: 50.0000 mg | ORAL_TABLET | Freq: Every day | ORAL | 0 refills | Status: AC

## 2018-08-24 MED ORDER — HEPARIN SOD (PORK) LOCK FLUSH 100 UNIT/ML IV SOLN
500.0000 [IU] | INTRAVENOUS | Status: AC | PRN
  Administered 2018-08-24: 14:00:00 500 [IU]

## 2018-08-24 MED ORDER — SODIUM CHLORIDE 0.9% FLUSH: 10.0000 mL | INTRAVENOUS | Status: DC | PRN

## 2018-08-24 NOTE — Telephone Encounter (Signed)
Patients daughter called as patient is in hospital.  She states that very little communication has occurred since patient in hospital and they are relying on patient for information and patient is not relaying information correctly.    Patient is being prescribed Prednisone 50 mg for 4 days and daughter wanted to make sure that it would be correct for her take since she is already taking Decadron 4 mg daily.  Per. Dr. Mickeal Skinner ok to proceed with short course of Prednisone on top of normal decadron dosing.  MRI was canceled because the patient was expected to stay in hospital past planned MRI date of 08/25/2018.  Daughter will call back and reschedule.

## 2018-08-24 NOTE — TOC Transition Note (Signed)
Transition of Care [facility]) - CM/SW Discharge Note   Patient Details  Name: Kristin Williams MRN: [id number] Date of Birth: 1945/09/05  [facility] CM/SW Contact:  Leeroy Cha, RN Phone Number: 08/24/2018, 11:12 AM   Clinical Narrative:    dcd to home   Final next level of care: Home/Self Care Barriers to Discharge: No Barriers Identified   Patient Goals and CMS Choice Patient states their goals for this hospitalization and ongoing recovery are:: to go home amnd live CMS Medicare.gov Compare Post Acute Care list provided to:: Patient    Discharge Placement                       Discharge Plan and Services                                     Social Determinants of Health (SDOH) Interventions     Readmission Risk Interventions No flowsheet data found.

## 2018-08-24 NOTE — Care Management CC44 (Signed)
Condition Code 44 Documentation Completed  Patient Details  Name: Kristin Williams MRN: [id number] Date of Birth: 10/08/1945   Condition Code 44 given:  Yes Patient signature on Condition Code 44 notice:  Yes Documentation of 2 MD's agreement:  Yes Code 44 added to claim:  Yes    Leeroy Cha, RN 08/24/2018, 12:22 PM

## 2018-08-24 NOTE — Discharge Summary (Signed)
Physician Discharge Summary  Kristin Williams:[id number] DOB: 05/03/1945 DOA: 08/23/2018  PCP: Lujean Amel, MD  Admit date: 08/23/2018 Discharge date: 08/24/2018  Admitted From: Home Disposition: Home  Recommendations for Outpatient Follow-up:  1. Follow up with PCP in 1-2 weeks 2. Please obtain BMP/CBC in one week 3. Please follow up on the following pending results:  Home Health: None Equipment/Devices: None  Discharge Condition: Stable CODE STATUS: DNR Diet recommendation: Previous home diet  Subjective: Seen and examined.  She feels significantly improved.  Denies any shortness of breath.  Has been off of oxygen for several hours.  Brief/Interim Summary: Kristin Williams is a 73 y.o. 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  Patient was slightly hypoxic in the emergency department require 2 to 3 L of oxygen.  She underwent CT angiogram of the chest and ruled out of any pneumonia, congestive heart failure findings or any PE however it showed possible atelectasis.  She is already on Coumadin for DVT and her INR was also therapeutic.  She was admitted to hospital service for presumed/working diagnosis of possible aspiration pneumonia since she has history of dysphagia and odynophagia due to esophageal cancer.  She was started on IV antibiotics.  Procalcitonin was checked which was unremarkable.  Patient did not have any leukocytosis and she remained afebrile.  Soon she did not even require oxygen and she has remained normoxic for several hours.  When seen today, she feels much better with no shortness of breath and is requesting to go home.  On my examination, she had mild expiratory wheezes.  Based on my findings today, she likely has mild acute  COPD exacerbation.  I have given a dose of Solu-Medrol IV 1251 today and I am going to discharge her on prednisone 50 mg p.o. daily for next 4 days to complete the course for possible acute COPD exacerbation.  Based on low procalcitonin, I highly doubt pneumonia so I will not prescribe her any antibiotics at this point in time.  Her BNP was also at her baseline so I do not think that she had any acute on chronic diastolic congestive heart failure either.  I believe her acute hypoxic respiratory failure could very well be due to combination of acute COPD exacerbation and atelectasis.  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, estrogen receptor positive ([place])   Hypertension, essential   DVT, lower extremity, distal, acute, left (HCC)   Acute on chronic respiratory failure with hypoxia (HCC)   COPD with acute exacerbation [facility])    Discharge Instructions  Discharge Instructions    Discharge patient   Complete by: As directed    Discharge disposition: 01-Home or Self Care   Discharge patient date: 08/24/2018     Allergies as of 08/24/2018      Reactions   Flonase [fluticasone Propionate] Other (See Comments)   Nose bleeds      Medication List    TAKE these medications   albuterol 108 (90 Base) MCG/ACT inhaler Commonly known as: VENTOLIN HFA Inhale 2 puffs into the lungs every 6 (six) hours as needed for wheezing or shortness of breath.   atorvastatin 20  MG tablet Commonly known as: LIPITOR Take 20 mg by mouth daily.   cyclobenzaprine 5 MG tablet Commonly known as: FLEXERIL Take 1 tablet (5 mg total) by mouth 3 (three) times daily as needed for muscle spasms.   dexamethasone 4 MG tablet Commonly known as: DECADRON Take 4mg  daily in the AM   furosemide 20 MG tablet Commonly known as: LASIX TAKE 1 TABLET BY MOUTH EVERY OTHER DAY   HYDROcodone-acetaminophen  7.5-325 MG tablet Commonly known as: NORCO Take 1 tablet by mouth every 6 (six) hours as needed. What changed: reasons to take this   letrozole 2.5 MG tablet Commonly known as: FEMARA Take 1 tablet (2.5 mg total) by mouth daily.   lidocaine 2 % solution Commonly known as: XYLOCAINE MIX 1 PART 2% VISCOUS LIDOCAINE, 1 PART H20 SWALLOW 10ML OF DILUTED MIXTURE, 20 MIN BEFORE MEALS AND AT BEDTIME UP TO 3 TIMES DAILY   lidocaine-prilocaine cream Commonly known as: EMLA Apply 1 application topically as needed. What changed: reasons to take this   LORazepam 1 MG tablet Commonly known as: Ativan Take 0.5 tablets (0.5 mg total) by mouth 3 (three) times daily as needed for anxiety or sleep.   magic mouthwash Soln Take 5 mLs by mouth 4 (four) times daily.   metoCLOPramide 10 MG tablet Commonly known as: REGLAN Take 1 tablet (10 mg total) by mouth every 6 (six) hours as needed. for nausea   nystatin 100000 UNIT/ML suspension Commonly known as: MYCOSTATIN TAKE 5ML BY MOUTH 4 TIMES DAILY   omeprazole 40 MG capsule Commonly known as: PRILOSEC Take 40 mg by mouth daily.   ondansetron 8 MG tablet Commonly known as: ZOFRAN Take 1 tablet (8 mg total) by mouth every 8 (eight) hours as needed for nausea or vomiting.   predniSONE 50 MG tablet Commonly known as: DELTASONE . Start taking on: August 25, 2018   sucralfate 1 g tablet Commonly known as: CARAFATE DISSOLVE ONE TABLET IN 10 MLS OF WATER AND SWALLOW UP TO FOUR TIMES DAILY TO SOOTHE THROAT What changed:   how much to take  how to take this  when to take this   traZODone 50 MG tablet Commonly known as: DESYREL Take 1 tablet (50 mg total) by mouth at bedtime. What changed:   when to take this  reasons to take this   warfarin 5 MG tablet Commonly known as: COUMADIN Take 1 tablet (5 mg total) by mouth daily. What changed:   how much to take  when to take this  additional instructions      Follow-up Information     Koirala, Dibas, MD Follow up in 1 week(s).   Specialty: Family Medicine Contact information: [address] [place] [address] [phone]          Allergies  Allergen Reactions  . Flonase [Fluticasone Propionate] Other (See Comments)    Nose bleeds    Consultations: None   Procedures/Studies: Ct Angio Chest Pe W And/or Wo Contrast  Result Date: 08/23/2018 CLINICAL DATA:  Hypoxia and shortness of breath. Personal history of esophageal cancer treated with radiation. Personal history of breast cancer. Personal history of brain metastases treated with radiation. Initial encounter. EXAM: CT ANGIOGRAPHY CHEST WITH CONTRAST TECHNIQUE: Multidetector CT imaging of the chest was performed using the standard protocol during bolus administration of intravenous contrast. Multiplanar CT image reconstructions and MIPs were obtained to evaluate the vascular anatomy. CONTRAST:  147mL OMNIPAQUE IOHEXOL 350 MG/ML SOLN COMPARISON:  One-view chest x-ray  08/23/2018 CT of the chest 01/26/2018. PET scan 02/13/2018 FINDINGS: Cardiovascular: Heart size upper limits of normal. Atherosclerotic changes are noted at the aortic arch. Calcifications are present at the origins the great vessels without focal stenosis. There is no aneurysm. Pulmonary artery opacification is excellent. No focal filling defects are present to suggest pulmonary emboli. Mediastinum/Nodes: No significant mediastinal, hilar or axillary adenopathy is present. There is moderate distal esophageal wall thickening circumferentially. Bulky mass lesion previously noted is no longer discretely evident. Esophageal thickening extends into the stomach. Lungs/Pleura: The right middle lobe pulmonary nodule measuring 12 mm is stable. Linear opacities at the right base likely reflect atelectasis. Infection is considered less likely. The left lung is clear. No significant effusions are present. There is no pneumothorax. Azygos fissure is  noted. Upper Abdomen: Visualized upper abdomen is within normal limits. Musculoskeletal: T3, T8, T10, and T11 compression fractures are stable. There is no progression of the fractures or retropulsed bone. No focal lytic or blastic lesions are present. Review of the MIP images confirms the above findings. IMPRESSION: 1. No pulmonary embolus. 2. Linear densities at the right base likely reflect atelectasis. Infection is considered less likely. 3. No other acute or focal abnormality to explain shortness of breath or hypoxia. 4. Residual circumferential distal esophageal thickening is less prominent than on the prior exam. No discrete mass lesion is evident. 5. Stable remote thoracic compression fractures. Electronically Signed   By: San Morelle M.D.   On: 08/23/2018 05:30   Mr Thoracic Spine W Wo Contrast  Result Date: 08/11/2018 CLINICAL DATA:  History of cancer of the cervix, breast, vagina, esophagus. Acute back pain. Rule out metastatic disease. EXAM: MRI THORACIC AND LUMBAR SPINE WITHOUT AND WITH CONTRAST TECHNIQUE: Multiplanar and multiecho pulse sequences of the thoracic and lumbar spine were obtained without and with intravenous contrast. CONTRAST:  10 mL Gadovist IV COMPARISON:  MRI lumbar spine 04/29/2010.  PET-CT 02/13/2018 FINDINGS: MRI THORACIC SPINE FINDINGS Alignment:  Normal Vertebrae: Mild compression fracture T3 vertebral body with slight enhancement. This likely is a healing benign fracture. No definite mass lesion. Mild compression fracture T8 with mild bone marrow edema. No enhancing mass lesion. Mild compression fracture T11 with slight edema. Probable healing fracture. No underlying mass lesion. Fatty changes in the bone marrow throughout the thoracic spine due to prior radiation. Cord:  Negative for cord compression.  Cord signal normal throughout Paraspinal and other soft tissues: Negative for paraspinous mass. No enlarged lymph nodes or pleural effusion. Disc levels: Negative for  disc protrusion. Mild disc degeneration T11-12 with mild disc bulging. No significant spinal stenosis. MRI LUMBAR SPINE FINDINGS Segmentation:  Normal Alignment: Mild retrolisthesis L1-2. Mild anterolisthesis L2-3. 8 mm anterolisthesis L3-4. 5 mm anterolisthesis L4-5. Vertebrae: Negative for lumbar fracture or mass. Mild fracture T11 as described above. No evidence of metastatic disease lumbar spine. Conus medullaris: Extends to the L1-2 level and appears normal. Paraspinal and other soft tissues: Negative for paraspinous mass or adenopathy. Disc levels: L1-2: Mild disc and mild facet degeneration.  Negative for stenosis L2-3: Mild anterolisthesis. Shallow right-sided disc protrusion. Advanced facet degeneration. Mild spinal stenosis and moderate subarticular stenosis on the right. L3-4: 8 mm anterolisthesis with severe facet degeneration. Severe spinal stenosis and severe subarticular stenosis bilaterally L4-5: 5 mm anterolisthesis with moderate spinal stenosis. Advanced facet degeneration bilaterally. Moderate subarticular stenosis bilaterally L5-S1: Moderate facet degeneration bilaterally with mild subarticular stenosis bilaterally. IMPRESSION: 1. Radiation changes in the thoracic spine. Compression fractures T3, T8, and T11 appear benign.  Mild enhancement at T3 and mild edema T11 suggest healing fractures. Negative for cord compression or spinal stenosis in the thoracic spine 2. Negative for lumbar fracture or metastatic disease 3. Lumbar degenerative changes at multiple levels. Severe spinal stenosis at L3-4 and moderate spinal stenosis L4-5. Electronically Signed   By: Franchot Gallo M.D.   On: 08/11/2018 11:03   Mr Lumbar Spine W Wo Contrast  Result Date: 08/11/2018 CLINICAL DATA:  History of cancer of the cervix, breast, vagina, esophagus. Acute back pain. Rule out metastatic disease. EXAM: MRI THORACIC AND LUMBAR SPINE WITHOUT AND WITH CONTRAST TECHNIQUE: Multiplanar and multiecho pulse sequences of the  thoracic and lumbar spine were obtained without and with intravenous contrast. CONTRAST:  10 mL Gadovist IV COMPARISON:  MRI lumbar spine 04/29/2010.  PET-CT 02/13/2018 FINDINGS: MRI THORACIC SPINE FINDINGS Alignment:  Normal Vertebrae: Mild compression fracture T3 vertebral body with slight enhancement. This likely is a healing benign fracture. No definite mass lesion. Mild compression fracture T8 with mild bone marrow edema. No enhancing mass lesion. Mild compression fracture T11 with slight edema. Probable healing fracture. No underlying mass lesion. Fatty changes in the bone marrow throughout the thoracic spine due to prior radiation. Cord:  Negative for cord compression.  Cord signal normal throughout Paraspinal and other soft tissues: Negative for paraspinous mass. No enlarged lymph nodes or pleural effusion. Disc levels: Negative for disc protrusion. Mild disc degeneration T11-12 with mild disc bulging. No significant spinal stenosis. MRI LUMBAR SPINE FINDINGS Segmentation:  Normal Alignment: Mild retrolisthesis L1-2. Mild anterolisthesis L2-3. 8 mm anterolisthesis L3-4. 5 mm anterolisthesis L4-5. Vertebrae: Negative for lumbar fracture or mass. Mild fracture T11 as described above. No evidence of metastatic disease lumbar spine. Conus medullaris: Extends to the L1-2 level and appears normal. Paraspinal and other soft tissues: Negative for paraspinous mass or adenopathy. Disc levels: L1-2: Mild disc and mild facet degeneration.  Negative for stenosis L2-3: Mild anterolisthesis. Shallow right-sided disc protrusion. Advanced facet degeneration. Mild spinal stenosis and moderate subarticular stenosis on the right. L3-4: 8 mm anterolisthesis with severe facet degeneration. Severe spinal stenosis and severe subarticular stenosis bilaterally L4-5: 5 mm anterolisthesis with moderate spinal stenosis. Advanced facet degeneration bilaterally. Moderate subarticular stenosis bilaterally L5-S1: Moderate facet degeneration  bilaterally with mild subarticular stenosis bilaterally. IMPRESSION: 1. Radiation changes in the thoracic spine. Compression fractures T3, T8, and T11 appear benign. Mild enhancement at T3 and mild edema T11 suggest healing fractures. Negative for cord compression or spinal stenosis in the thoracic spine 2. Negative for lumbar fracture or metastatic disease 3. Lumbar degenerative changes at multiple levels. Severe spinal stenosis at L3-4 and moderate spinal stenosis L4-5. Electronically Signed   By: Franchot Gallo M.D.   On: 08/11/2018 11:03   Dg Chest Portable 1 View  Result Date: 08/23/2018 CLINICAL DATA:  Shortness of breath EXAM: PORTABLE CHEST 1 VIEW COMPARISON:  Chest x-ray dated 04/09/2018. FINDINGS: There is a well-positioned right-sided Port-A-Cath. The heart size is enlarged. There are bibasilar airspace opacities. There is an azygos lobe. There is no pneumothorax. There is some mild blunting of the costophrenic angles bilaterally. There is hyperexpansion of the lungs. Aortic calcifications are noted. There is a stable calcification projecting over the left lower lobe, likely related to the patient's breast tissue. Previously noted nodular opacity in the right middle lobe is not well appreciated. IMPRESSION: 1. Well-positioned right-sided Port-A-Cath. 2. Mild cardiomegaly. 3. New right lower lobe airspace opacity which may represent atelectasis or infiltrate. Electronically Signed   By: Harrell Gave  Green M.D.   On: 08/23/2018 02:25      Discharge Exam: Vitals:   08/23/18 2201 08/24/18 0625  BP: 120/86 120/78  Pulse: 84 88  Resp: 16 16  Temp: 97.7 F (36.5 C) 98.5 F (36.9 C)  SpO2: 97% 97%   Vitals:   08/23/18 1625 08/23/18 1718 08/23/18 2201 08/24/18 0625  BP: 109/62 (!) 144/86 120/86 120/78  Pulse: 88 97 84 88  Resp: (!) 22 (!) 22 16 16   Temp:  99 F (37.2 C) 97.7 F (36.5 C) 98.5 F (36.9 C)  TempSrc:  Oral    SpO2: 95% 97% 97% 97%  Weight:      Height:         General: Pt is alert, awake, not in acute distress Cardiovascular: RRR, S1/S2 +, no rubs, no gallops Respiratory: End expiratory wheezes bilaterally, no wheezing, no rhonchi, no crackles Abdominal: Soft, NT, ND, bowel sounds + Extremities: Trace pitting edema bilateral lower extremity, no cyanosis    The results of significant diagnostics from this hospitalization (including imaging, microbiology, ancillary and laboratory) are listed below for reference.     Microbiology: Recent Results (from the past 240 hour(s))  SARS Coronavirus 2 (CEPHEID- Performed in [place] hospital lab), Hosp Order     Status: None   Collection Time: 08/23/18  5:57 AM   Specimen: Nasopharyngeal Swab  Result Value Ref Range Status   SARS Coronavirus 2 NEGATIVE NEGATIVE Final    Comment: (NOTE) If result is NEGATIVE SARS-CoV-2 target nucleic acids are NOT DETECTED. The SARS-CoV-2 RNA is generally detectable in upper and lower  respiratory specimens during the acute phase of infection. The lowest  concentration of SARS-CoV-2 viral copies this assay can detect is 250  copies / mL. A negative result does not preclude SARS-CoV-2 infection  and should not be used as the sole basis for treatment or other  patient management decisions.  A negative result may occur with  improper specimen collection / handling, submission of specimen other  than nasopharyngeal swab, presence of viral mutation(s) within the  areas targeted by this assay, and inadequate number of viral copies  (<250 copies / mL). A negative result must be combined with clinical  observations, patient history, and epidemiological information. If result is POSITIVE SARS-CoV-2 target nucleic acids are DETECTED. The SARS-CoV-2 RNA is generally detectable in upper and lower  respiratory specimens dur ing the acute phase of infection.  Positive  results are indicative of active infection with SARS-CoV-2.  Clinical  correlation with patient  history and other diagnostic information is  necessary to determine patient infection status.  Positive results do  not rule out bacterial infection or co-infection with other viruses. If result is PRESUMPTIVE POSTIVE SARS-CoV-2 nucleic acids MAY BE PRESENT.   A presumptive positive result was obtained on the submitted specimen  and confirmed on repeat testing.  While 2019 novel coronavirus  (SARS-CoV-2) nucleic acids may be present in the submitted sample  additional confirmatory testing may be necessary for epidemiological  and / or clinical management purposes  to differentiate between  SARS-CoV-2 and other Sarbecovirus currently known to infect humans.  If clinically indicated additional testing with an alternate test  methodology [id number]) is advised. The SARS-CoV-2 RNA is generally  detectable in upper and lower respiratory sp ecimens during the acute  phase of infection. The expected result is Negative. Fact Sheet for Patients:  StrictlyIdeas.no Fact Sheet for Healthcare Providers: BankingDealers.co.za This test is not yet approved or cleared by the  [place] Architectural technologist and has been authorized for detection and/or diagnosis of SARS-CoV-2 by FDA under an Print production planner (EUA).  This EUA will remain in effect (meaning this test can be used) for the duration of the COVID-19 declaration under Section 564(b)(1) of the Act, 21 U.S.C. section 360bbb-3(b)(1), unless the authorization is terminated or revoked sooner. Performed at [facility], [place] [address], [place], [place] [address]      Labs: BNP (last 3 results) Recent Labs    04/12/18 1031 08/23/18 0157  BNP 254.0* 086.5*   Basic Metabolic Panel: Recent Labs  Lab 08/21/18 0856 08/23/18 0157  NA 138 130*  K 3.7 3.7  CL 104 98  CO2 24 21*  GLUCOSE 85 94  BUN 11 19  CREATININE 0.95 0.75  CALCIUM 8.6* 8.5*   Liver Function Tests: Recent  Labs  Lab 08/21/18 0856  AST 14*  ALT 18  ALKPHOS 150*  BILITOT 0.5  PROT 5.7*  ALBUMIN 2.9*   No results for input(s): LIPASE, AMYLASE in the last 168 hours. No results for input(s): AMMONIA in the last 168 hours. CBC: Recent Labs  Lab 08/21/18 0856 08/23/18 0232  WBC 8.6 8.4  NEUTROABS 7.6 7.7  HGB 10.2* 9.8*  HCT 32.1* 30.5*  MCV 104.9* 103.0*  PLT 153 147*   Cardiac Enzymes: Recent Labs  Lab 08/23/18 0157  TROPONINI <0.03   BNP: Invalid input(s): POCBNP CBG: No results for input(s): GLUCAP in the last 168 hours. D-Dimer No results for input(s): DDIMER in the last 72 hours. Hgb A1c No results for input(s): HGBA1C in the last 72 hours. Lipid Profile No results for input(s): CHOL, HDL, LDLCALC, TRIG, CHOLHDL, LDLDIRECT in the last 72 hours. Thyroid function studies No results for input(s): TSH, T4TOTAL, T3FREE, THYROIDAB in the last 72 hours.  Invalid input(s): FREET3 Anemia work up No results for input(s): VITAMINB12, FOLATE, FERRITIN, TIBC, IRON, RETICCTPCT in the last 72 hours. Urinalysis    Component Value Date/Time   COLORURINE STRAW (A) 05/30/2018 1354   APPEARANCEUR CLEAR 05/30/2018 1354   LABSPEC 1.003 (L) 05/30/2018 1354   PHURINE 7.0 05/30/2018 1354   GLUCOSEU NEGATIVE 05/30/2018 1354   HGBUR SMALL (A) 05/30/2018 1354   BILIRUBINUR NEGATIVE 05/30/2018 1354   KETONESUR NEGATIVE 05/30/2018 1354   PROTEINUR NEGATIVE 05/30/2018 1354   NITRITE NEGATIVE 05/30/2018 1354   LEUKOCYTESUR TRACE (A) 05/30/2018 1354   Sepsis Labs Invalid input(s): PROCALCITONIN,  WBC,  LACTICIDVEN Microbiology Recent Results (from the past 240 hour(s))  SARS Coronavirus 2 (CEPHEID- Performed in [place] hospital lab), Hosp Order     Status: None   Collection Time: 08/23/18  5:57 AM   Specimen: Nasopharyngeal Swab  Result Value Ref Range Status   SARS Coronavirus 2 NEGATIVE NEGATIVE Final    Comment: (NOTE) If result is NEGATIVE SARS-CoV-2 target nucleic acids  are NOT DETECTED. The SARS-CoV-2 RNA is generally detectable in upper and lower  respiratory specimens during the acute phase of infection. The lowest  concentration of SARS-CoV-2 viral copies this assay can detect is 250  copies / mL. A negative result does not preclude SARS-CoV-2 infection  and should not be used as the sole basis for treatment or other  patient management decisions.  A negative result may occur with  improper specimen collection / handling, submission of specimen other  than nasopharyngeal swab, presence of viral mutation(s) within the  areas targeted by this assay, and inadequate number of viral copies  (<250 copies / mL).  A negative result must be combined with clinical  observations, patient history, and epidemiological information. If result is POSITIVE SARS-CoV-2 target nucleic acids are DETECTED. The SARS-CoV-2 RNA is generally detectable in upper and lower  respiratory specimens dur ing the acute phase of infection.  Positive  results are indicative of active infection with SARS-CoV-2.  Clinical  correlation with patient history and other diagnostic information is  necessary to determine patient infection status.  Positive results do  not rule out bacterial infection or co-infection with other viruses. If result is PRESUMPTIVE POSTIVE SARS-CoV-2 nucleic acids MAY BE PRESENT.   A presumptive positive result was obtained on the submitted specimen  and confirmed on repeat testing.  While 2019 novel coronavirus  (SARS-CoV-2) nucleic acids may be present in the submitted sample  additional confirmatory testing may be necessary for epidemiological  and / or clinical management purposes  to differentiate between  SARS-CoV-2 and other Sarbecovirus currently known to infect humans.  If clinically indicated additional testing with an alternate test  methodology [phone]) is advised. The SARS-CoV-2 RNA is generally  detectable in upper and lower respiratory sp ecimens  during the acute  phase of infection. The expected result is Negative. Fact Sheet for Patients:  StrictlyIdeas.no Fact Sheet for Healthcare Providers: BankingDealers.co.za This test is not yet approved or cleared by the [place] FDA and has been authorized for detection and/or diagnosis of SARS-CoV-2 by FDA under an Emergency Use Authorization (EUA).  This EUA will remain in effect (meaning this test can be used) for the duration of the COVID-19 declaration under Section 564(b)(1) of the Act, 21 U.S.C. section 360bbb-3(b)(1), unless the authorization is terminated or revoked sooner. Performed at [facility], [place] [address], [place], [place] [address]      Time coordinating discharge:  30 minutes  SIGNED:   Darliss Cheney, MD  [facility] 08/24/2018, 10:50 AM Pager [phone]  If 7PM-7AM, please contact night-coverage [email or web address] Password [email or web address]

## 2018-08-24 NOTE — Discharge Instructions (Signed)
COPD and Physical Activity  Chronic obstructive pulmonary disease (COPD) is a long-term (chronic) condition that affects the lungs. COPD is a general term that can be used to describe many different lung problems that cause lung swelling (inflammation) and limit airflow, including chronic bronchitis and emphysema.  The main symptom of COPD is shortness of breath, which makes it harder to do even simple tasks. This can also make it harder to exercise and be active. Talk with your health care provider about treatments to help you breathe better and actions you can take to prevent breathing problems during physical activity.  What are the benefits of exercising with COPD?  Exercising regularly is an important part of a healthy lifestyle. You can still exercise and do physical activities even though you have COPD. Exercise and physical activity improve your shortness of breath by increasing blood flow (circulation). This causes your heart to pump more oxygen through your body. Moderate exercise can improve your:  · Oxygen use.  · Energy level.  · Shortness of breath.  · Strength in your breathing muscles.  · Heart health.  · Sleep.  · Self-esteem and feelings of self-worth.  · Depression, stress, and anxiety levels.  Exercise can benefit everyone with COPD. The severity of your disease may affect how hard you can exercise, especially at first, but everyone can benefit. Talk with your health care provider about how much exercise is safe for you, and which activities and exercises are safe for you.  What actions can I take to prevent breathing problems during physical activity?  · Sign up for a pulmonary rehabilitation program. This type of program may include:  ? Education about lung diseases.  ? Exercise classes that teach you how to exercise and be more active while improving your breathing. This usually involves:  § Exercise using your lower extremities, such as a stationary bicycle.  § About 30 minutes of exercise, 2  to 5 times per week, for 6 to 12 weeks  § Strength training, such as push ups or leg lifts.  ? Nutrition education.  ? Group classes in which you can talk with others who also have COPD and learn ways to manage stress.  · If you use an oxygen tank, you should use it while you exercise. Work with your health care provider to adjust your oxygen for your physical activity. Your resting flow rate is different from your flow rate during physical activity.  · While you are exercising:  ? Take slow breaths.  ? Pace yourself and do not try to go too fast.  ? Purse your lips while breathing out. Pursing your lips is similar to a kissing or whistling position.  ? If doing exercise that uses a quick burst of effort, such as weight lifting:  § Breathe in before starting the exercise.  § Breathe out during the hardest part of the exercise (such as raising the weights).  Where to find support  You can find support for exercising with COPD from:  · Your health care provider.  · A pulmonary rehabilitation program.  · Your local health department or community health programs.  · Support groups, online or in-person. Your health care provider may be able to recommend support groups.  Where to find more information  You can find more information about exercising with COPD from:  · American Lung Association: lung.org.  · COPD Foundation: copdfoundation.org.  Contact a health care provider if:  · Your symptoms get worse.  · You   have chest pain.  · You have nausea.  · You have a fever.  · You have trouble talking or catching your breath.  · You want to start a new exercise program or a new activity.  Summary  · COPD is a general term that can be used to describe many different lung problems that cause lung swelling (inflammation) and limit airflow. This includes chronic bronchitis and emphysema.  · Exercise and physical activity improve your shortness of breath by increasing blood flow (circulation). This causes your heart to provide more  oxygen to your body.  · Contact your health care provider before starting any exercise program or new activity. Ask your health care provider what exercises and activities are safe for you.  This information is not intended to replace advice given to you by your health care provider. Make sure you discuss any questions you have with your health care provider.  Document Released: 03/24/2017 Document Revised: 03/24/2017 Document Reviewed: 03/24/2017  [employer]vier Interactive Patient Education © 2019 [employer]

## 2018-08-24 NOTE — Progress Notes (Signed)
[facility] RN collected from pt's port prior to heparin locking port and de-accessing. Lab called RN and asked for blue top tube. Advised pt is being discharged and she is no longer accessed.

## 2018-08-24 NOTE — Progress Notes (Signed)
Pt refused her iv prednisone, Dr Doristine Bosworth notified.

## 2018-08-25 ENCOUNTER — Telehealth: Payer: Self-pay | Admitting: Medical Oncology

## 2018-08-25 ENCOUNTER — Other Ambulatory Visit: Payer: Medicare Other

## 2018-08-25 NOTE — Telephone Encounter (Signed)
Brain MRI reschedule to June 30th    ? Does Dr Mickeal Skinner want to r/s his appt with pt on 06/19.

## 2018-08-28 ENCOUNTER — Other Ambulatory Visit: Payer: Self-pay | Admitting: Hematology

## 2018-08-28 DIAGNOSIS — C154 Malignant neoplasm of middle third of esophagus: Secondary | ICD-10-CM

## 2018-08-28 NOTE — Care Management Obs Status (Signed)
[facility] NOTIFICATION   Patient Details  Name: Kristin PFAHLER MRN: [id number] Date of Birth: December 31, 1945   Medicare Observation Status Notification Given:  Yes    Leeroy Cha, RN 08/28/2018, 2:58 PM

## 2018-08-29 ENCOUNTER — Ambulatory Visit: Payer: Medicare Other | Admitting: Internal Medicine

## 2018-08-29 ENCOUNTER — Other Ambulatory Visit: Payer: Self-pay

## 2018-08-29 ENCOUNTER — Encounter (HOSPITAL_COMMUNITY): Payer: Self-pay

## 2018-08-29 ENCOUNTER — Encounter (HOSPITAL_COMMUNITY)
Admission: RE | Admit: 2018-08-29 | Discharge: 2018-08-29 | Disposition: A | Discharge status: Home / Self Care | Payer: Medicare Other | Source: Ambulatory Visit | Attending: Hematology | Admitting: Hematology

## 2018-08-29 DIAGNOSIS — C159 Malignant neoplasm of esophagus, unspecified: Secondary | ICD-10-CM

## 2018-08-29 LAB — CULTURE, BLOOD (ROUTINE X 2)
Culture: NO GROWTH
Culture: NO GROWTH
Special Requests: ADEQUATE

## 2018-08-30 ENCOUNTER — Other Ambulatory Visit: Payer: Self-pay | Admitting: Nurse Practitioner

## 2018-08-30 ENCOUNTER — Encounter (HOSPITAL_COMMUNITY)
Admission: RE | Admit: 2018-08-30 | Discharge: 2018-08-30 | Disposition: A | Discharge status: Home / Self Care | Payer: Medicare Other | Source: Ambulatory Visit | Attending: Hematology | Admitting: Hematology

## 2018-08-30 DIAGNOSIS — C159 Malignant neoplasm of esophagus, unspecified: Secondary | ICD-10-CM | POA: Insufficient documentation

## 2018-08-30 DIAGNOSIS — M545 Low back pain, unspecified: Secondary | ICD-10-CM

## 2018-08-30 DIAGNOSIS — C7931 Secondary malignant neoplasm of brain: Secondary | ICD-10-CM | POA: Diagnosis not present

## 2018-08-30 LAB — GLUCOSE, CAPILLARY: Glucose-Capillary: 72 mg/dL (ref 70–99)

## 2018-08-30 MED ORDER — FLUDEOXYGLUCOSE F - 18 (FDG) INJECTION: 9.4000 | Freq: Once | INTRAVENOUS | Status: DC | PRN

## 2018-08-30 MED ORDER — HYDROCODONE-ACETAMINOPHEN 7.5-325 MG PO TABS: 1.0000 | ORAL_TABLET | Freq: Four times a day (QID) | ORAL | 0 refills | Status: DC | PRN

## 2018-09-01 ENCOUNTER — Telehealth: Payer: Self-pay

## 2018-09-01 ENCOUNTER — Other Ambulatory Visit: Payer: Self-pay | Admitting: Hematology

## 2018-09-01 DIAGNOSIS — J449 Chronic obstructive pulmonary disease, unspecified: Secondary | ICD-10-CM | POA: Diagnosis not present

## 2018-09-01 DIAGNOSIS — C159 Malignant neoplasm of esophagus, unspecified: Secondary | ICD-10-CM | POA: Diagnosis not present

## 2018-09-01 DIAGNOSIS — C7931 Secondary malignant neoplasm of brain: Secondary | ICD-10-CM | POA: Diagnosis not present

## 2018-09-01 DIAGNOSIS — R609 Edema, unspecified: Secondary | ICD-10-CM | POA: Diagnosis not present

## 2018-09-01 MED ORDER — FUROSEMIDE 20 MG PO TABS: 40.0000 mg | ORAL_TABLET | Freq: Every day | ORAL | 0 refills | Status: DC

## 2018-09-01 MED ORDER — POTASSIUM CHLORIDE CRYS ER 20 MEQ PO TBCR: 20.0000 meq | EXTENDED_RELEASE_TABLET | Freq: Every day | ORAL | 0 refills | Status: AC

## 2018-09-01 NOTE — Telephone Encounter (Signed)
Per Dr. Burr Medico increase Lasix to 40 mg daily through the weekend, make sure you are taking the KCL 20 meq daily.  A new script for Lasix was sent into the pharmacy.  Patient and her husband verbalized an understanding.

## 2018-09-01 NOTE — Telephone Encounter (Signed)
Patient's daughter calls stating she has 4+ pitting edema in lower extremities and in her arms, currently taking Lasix 20 mg, would like advice on whether she should increase Lasix to 40 mg and also they are in need of a refill on this.    [phone]

## 2018-09-01 NOTE — Progress Notes (Signed)
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   01/30/2018 Initial Diagnosis   Metastasis to brain [facility])   01/31/2018 Imaging   MRI Brain W WO Contrast 01/31/18  IMPRESSION: 3 Tesla study does not disclose any additional lesions. Solitary metastasis in the right superior cerebellum measuring 2.9 x 2.6 x 3.0 cm with mild surrounding edema. 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   02/03/2018 - 02/08/2018 Radiation Therapy   SBRT with Dr. Isidore Moos on 02/03/18, 02/06/18, 02/08/18   Esophageal cancer, stage IV ([place])  01/08/2018 Imaging   CT AP W Constrast 01/08/18  IMPRESSION: 1. Nonobstructed, nondistended bowel. No acute inflammatory process. 2. Moderate-sized hiatal hernia noted. 3. Low-density 1.7 cm left adrenal nodule likely to represent a small adenoma. Lower pole left renal cyst measuring 1.8 cm. 4. Moderate aortic atherosclerosis. 5. Lumbar spondylosis and facet arthropathy.   01/27/2018 Imaging   CT Chest W Contrast 01/27/18  IMPRESSION: 1. Large mass involving the distal half of the esophagus spanning approximately 11.7 cm is identified compatible with primary esophageal neoplasm.  2. Small posterior mediastinal and high right paratracheal lymph nodes are noted which may represent foci of metastatic adenopathy. 3. The small pulmonary nodules in the right lung are unchanged from 01/15/2016 and are favored to represent a benign process. 4. Aortic Atherosclerosis (ICD10-I70.0) and Emphysema (ICD10-J43.9). 5. Coronary artery atherosclerotic calcifications.   01/30/2018 Initial Diagnosis   Esophageal cancer, stage IV ([place])   02/03/2018 - 02/08/2018 Radiation Therapy   SBRT to oligo brain metastasis, in 3 fractions, under Dr. Isidore Moos    02/16/2018 PET scan   PET 02/16/18 IMPRESSION: 1. A 15.7 cm in length esophageal mass extends from the aortic arch level down to the distal esophagus, maximum SUV 22.4, compatible with malignancy. There is a small right lower neck level IV lymph node with lower than mediastinal blood pool activity, and a right upper paratracheal lymph node with just above mediastinal blood pool activity. 2. Faint speckled heterogeneity in the liver, for example in the lateral segment left hepatic lobe, is likely incidental/benign given the lack of correlate of CT finding. It might be prudent to obtain an MRI just to make sure that there is not a small early metastatic lesion in this vicinity. 3. Known right cerebellar vermian metastatic lesion, with expected hypo activity compared to the surrounding brain activity. 4. The right middle lobe pulmonary nodule measures 1.1 by 1.0 cm and is mildly hypermetabolic with maximum SUV 2.6. This lesion measured 6 by 5 mm back on 12/06/2003 and 1.2 by 1.1 cm on 10/15/2015. I am uncertain what to make of the very slow growth and mildly accentuated metabolic activity. This could be a chronic granulomatous process. Low-grade adenocarcinoma of the lung seems unlikely to be so indolent  as to only mildly increase over a 14 year period, but is presumably a differential diagnostic consideration. Surveillance is  suggested. 5. Other imaging findings of potential clinical significance: Aortic Atherosclerosis (ICD10-I70.0). Descending and sigmoid colon diverticulosis. Gas in the urinary bladder, most common cause would be recent catheterization. Minimal chronic left ethmoid sinusitis.   02/27/2018 - 04/09/2018 Chemotherapy   Concurrent chemoRT with weekly carboplatin and Taxol 02/27/2018-04/03/18   02/27/2018 - 04/07/2018 Radiation Therapy   Concurrent ChemoRT 02/27/18-04/07/18   06/12/2018 -  Chemotherapy   Pending FOLFOX q2weeks starting 06/12/18   08/31/2018 PET scan      PET 08/30/18 IMPRESSION: 1. Decrease in size and metabolic activity of distal esophageal primary carcinoma. 2. No evidence metastatic adenopathy in the chest or abdomen. 3. Lesion in the cerebellum again noted. 4. Stable pulmonary nodule RIGHT middle lobe is not related to esophageal carcinoma as present in 2005 (smaller then). Favor indolent neoplasm such as carcinoid or hamartoma.   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 Ultrasound-guided biopsy is recommended.   01/10/2018 Initial Biopsy   Diagnsis from Breast Biopsy 01/10/18  Breast, right, needle core biopsy, 6 o'clock - INVASIVE DUCTAL CARCINOMA WITH CALCIFICATIONS, SEE COMMENT. - DUCTAL CARCINOMA IN SITU. 1 of 2 FINAL for Kristin, Williams [phone]) Microscopic Comment The carcinoma appears grade 2. Prognostic markers will be ordered. Dr. [name] has reviewed the case. The case was called to [place] on 01/11/2018.   01/10/2018 Receptors her2   The tumor cells are NEGATIVE for Her2 (1+). Estrogen Receptor: 100%, POSITIVE, STRONG STAINING INTENSITY Progesterone  Receptor: 100%, POSITIVE, STRONG STAINING INTENSITY Proliferation Marker Ki67: 15%   01/10/2018 Cancer Staging   Staging form: Breast, AJCC 8th Edition - Clinical stage from 01/10/2018: Stage IA (cT1b, cN0, cM0, G2, ER+, PR+, HER2-) - Signed by [name], MD on 02/03/2018   01/30/2018 Initial Diagnosis   Breast cancer ([place])   05/07/2018 -  Anti-estrogen oral therapy   Anastrozole 59m daily starting 05/07/2018      CURRENT THERAPY:  -Anastrozole 160mdaily started2/23/2020. Due to rash, switched to Letrozole 2.59m28mn 06/2718. -FOLFOXwith leucovorinq2weeks starting 06/12/18  INTERVAL HISTORY:  BerNIZHONI PARLOW here for a follow up and treatment. She presents to the clinic alone with her daughter and husband over the phone. She was recently hospitlaized for hypoxia on 08/23/18. She notes having a rough few weeks, but feels better today. She has back pain at one spot in her lower back, worse with ambulation and when coughing. She continues on hydrocodone and flexeril. She has difficulty swallowing, noting food "sticks" in her esophagus but then eventually goes down with water. She has shortness of breath, worse on some days more than others. Her lower extremity and right hand edema continues without improvement, despite increase in Lasix. She has scattered bruising on bilateral legs. She continues on potassium supplement and letrozole.    REVIEW OF SYSTEMS:  Constitutional: Denies fevers, chills or abnormal weight loss (+) fatigue Eyes: Denies blurriness of vision Ears, nose, mouth, throat, and face: Denies mucositis or sore throat (+) dysphagia Respiratory: Denies wheezes (+) chronic cough (+) dyspnea Cardiovascular: Denies palpitation, chest discomfort or lower extremity swelling Gastrointestinal:  Denies nausea, heartburn or change in bowel habits  Skin: Denies abnormal skin rashes MSK: (+) back pain Lymphatics: Denies new lymphadenopathy or easy bruising (+) edema of BLE, right hand  Neurological:Denies numbness, tingling or new weaknesses Behavioral/Psych: Mood is stable, no new changes  All other systems were reviewed with the patient and are negative.  MEDICAL HISTORY:  Past Medical History:  Diagnosis Date  . Arthritis   . Cancer (HCC)    Cervical  . Cancer ([place])    Breast  . Cancer ([place])    Vaginal  . Complication of anesthesia   . COPD (chronic obstructive pulmonary disease) ([place])   . Headache(784.0)   . History of radiation therapy 02/28/19- 04/07/18   Esophagus, 1.8 Gy in 28 fractions for a total dose of 50.4 Gy.   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   . Hyperlipidemia   . Leg pain   . Peripheral vascular disease ([place])   . Personal history of chemotherapy   . Personal history of radiation therapy   . PONV (postoperative nausea and vomiting)     SURGICAL HISTORY: Past Surgical History:  Procedure Laterality Date  . ABDOMINAL HYSTERECTOMY  1975  . BREAST LUMPECTOMY  2005  . GANGLION CYST EXCISION  1977  . ILIAC ARTERY STENT  04/20/2004   CSD right iliac occlusive disease  . IR IMAGING GUIDED PORT INSERTION  02/22/2018    I have reviewed the social history and family history with the patient and they are unchanged from previous note.  ALLERGIES:  is allergic to flonase [fluticasone propionate].  MEDICATIONS:  Current Outpatient Medications  Medication Sig Dispense Refill  . albuterol (PROVENTIL HFA;VENTOLIN HFA) 108 (90 Base) MCG/ACT inhaler Inhale 2 puffs into the lungs every 6 (six) hours as needed for wheezing or shortness of breath.    Marland Kitchen atorvastatin (LIPITOR) 20 MG tablet Take 20 mg by mouth daily.    . cyclobenzaprine (FLEXERIL) 5 MG tablet Take 1 tablet (5 mg total) by mouth 3 (three) times daily as needed for muscle spasms. 270 tablet 0  . dexamethasone (DECADRON) 4 MG tablet Take 42m daily in the AM 30 tablet 2  . furosemide (LASIX) 20 MG tablet Take 2 tablets (40 mg total)  by mouth daily. 60 tablet 0  . HYDROcodone-acetaminophen (NORCO) 7.5-325 MG tablet Take 1 tablet by mouth every 6 (six) hours as needed for moderate pain. 45 tablet 0  . letrozole (FEMARA) 2.5 MG tablet Take 1 tablet (2.5 mg total) by mouth daily. 30 tablet 3  . lidocaine (XYLOCAINE) 2 % solution MIX 1 PART LIDOCAINE, 1 PART H20 SWALLOW 10ML OF DILUTED MIXTURE, 20 MINUTES BEFORE MEALS AND AT BEDTIME UP TO 3 TIMES DAILY. 300 mL 0  . lidocaine-prilocaine (EMLA) cream Apply 1 application topically as needed. (Patient taking differently: Apply 1 application topically as needed (port). ) 30 g 1  . LORazepam (ATIVAN) 1 MG tablet Take 0.5 tablets (0.5 mg total) by mouth 3 (three) times daily as needed for anxiety or sleep. 45 tablet 0  . magic mouthwash SOLN Take 5 mLs by mouth 4 (four) times daily. 60 mL 0  . metoCLOPramide (REGLAN) 10 MG tablet Take 1 tablet (10 mg total) by mouth every 6 (six) hours as needed. for nausea 60 tablet 1  . nystatin (MYCOSTATIN) 100000 UNIT/ML suspension TAKE  5 ML BY MOUTH 4 TIMES DAILY 60 mL 0  . omeprazole (PRILOSEC) 40 MG capsule Take 40 mg by mouth daily.    .Marland Kitchen  ondansetron (ZOFRAN) 8 MG tablet Take 1 tablet (8 mg total) by mouth every 8 (eight) hours as needed for nausea or vomiting. 20 tablet 0  . potassium chloride SA (K-DUR) 20 MEQ tablet Take 1 tablet (20 mEq total) by mouth daily. 30 tablet 0  . sucralfate (CARAFATE) 1 g tablet DISSOLVE ONE TABLET IN 10 MLS OF WATER AND SWALLOW UP TO FOUR TIMES DAILY TO SOOTHE THROAT (Patient taking differently: Take 1 g by mouth See admin instructions. DISSOLVE ONE TABLET IN 10 MLS OF WATER AND SWALLOW UP TO FOUR TIMES DAILY TO SOOTHE THROAT) 40 tablet 2  . traZODone (DESYREL) 50 MG tablet Take 1 tablet (50 mg total) by mouth at bedtime. (Patient taking differently: Take 50 mg by mouth at bedtime as needed for sleep. ) 30 tablet 1  . warfarin (COUMADIN) 5 MG tablet Take 1 tablet (5 mg total) by mouth daily. (Patient taking  differently: Take 2.5-5 mg by mouth See admin instructions. 2.5 MG on Monday, Wednesday and Saturday 5 Mg on Sunday, Tuesday, Thursday and Friday) 30 tablet 3  . budesonide-formoterol (SYMBICORT) 160-4.5 MCG/ACT inhaler Inhale 1 puff into the lungs 2 (two) times daily.    . predniSONE (DELTASONE) 50 MG tablet . (Patient not taking: Reported on 09/04/2018) 4 tablet 0   No current facility-administered medications for this visit.    Facility-Administered Medications Ordered in Other Visits  Medication Dose Route Frequency Provider Last Rate Last Dose  . fludeoxyglucose F - 18 (FDG) injection 9.4 millicurie  9.4 millicurie Intravenous Once PRN Kerby Moors, MD        PHYSICAL EXAMINATION: ECOG PERFORMANCE STATUS: 3 - Symptomatic, >50% confined to bed  Vitals:   09/04/18 0852  BP: (!) 180/93  Pulse: 95  Resp: 20  Temp: 98.3 F (36.8 C)  SpO2: 99%   Filed Weights   09/04/18 0852  Weight: 188 lb 14.4 oz (85.7 kg)    GENERAL:alert, no distress and comfortable SKIN: skin color, texture, turgor are normal, no rashes or significant lesions (+) bruising on BLE EYES: normal, Conjunctiva are pink and non-injected, sclera clear OROPHARYNX:no exudate, no erythema and lips, buccal mucosa, and tongue normal  NECK: supple, thyroid normal size, non-tender, without nodularity LYMPH:  no palpable lymphadenopathy in the cervical, axillary (+) 2+ pitting edema on bilateral lower extremities, right hand LUNGS: clear to auscultation and percussion with normal breathing effort HEART: regular rate & rhythm and no murmurs and no lower extremity edema ABDOMEN:abdomen soft, non-tender and normal bowel sounds Musculoskeletal:no cyanosis of digits and no clubbing  NEURO: alert & oriented x 3 with fluent speech, ambulates with walker  LABORATORY DATA:  I have reviewed the data as listed CBC Latest Ref Rng & Units 09/04/2018 08/24/2018 08/23/2018  WBC 4.0 - 10.5 K/uL 3.2(L) 6.0 8.4  Hemoglobin 12.0 - 15.0  g/dL 10.7(L) 10.8(L) 9.8(L)  Hematocrit 36.0 - 46.0 % 33.3(L) 33.2(L) 30.5(L)  Platelets 150 - 400 K/uL 122(L) 147(L) 147(L)     CMP Latest Ref Rng & Units 09/04/2018 08/24/2018 08/23/2018  Glucose 70 - 99 mg/dL 86 106(H) 94  BUN 8 - 23 mg/dL _0 Creatinine 0.44 - 1.00 mg/dL 0.85 0.76 0.75  Sodium 135 - 145 mmol/L 135 132(L) 130(L)  Potassium 3.5 - 5.1 mmol/L 3.5 3.6 3.7  Chloride 98 - 111 mmol/L 97(L) 96(L) 98  CO2 22 - 32 mmol/L 26 26 21(L)  Calcium 8.9 - 10.3 mg/dL 9.1 7.7(L) 8.5(L)  Total Protein 6.5 - 8.1 g/dL 6.4(L) - -  Total Bilirubin 0.3 - 1.2 mg/dL 0.6 - -  Alkaline Phos 38 - 126 U/L 163(H) - -  AST 15 - 41 U/L 18 - -  ALT 0 - 44 U/L 25 - -      RADIOGRAPHIC STUDIES: I have personally reviewed the radiological images as listed and agreed with the findings in the report. No results found.   ASSESSMENT & PLAN:  Kristin Williams is a 73 y.o. female with   1.EsophagealAdenocarcinoma,with oligo met incerebellum, stage IV, HER2(-), MSS, and PD-L1(-) -She was diagnosed with esophogeal cancerin 01/2018,with oligometastasisto the brain. Given her stage IV disease her cancer is not curable but still treatable. Goal of care it to control her disease.  -She completedSBRTto her brain meton11/27/2019and completed chemoRTto primary tumoron 04/07/18.She recovered well. -Her molecular testing on the tumor showed MSI stable disease, PD-L1 negative, she is not a candidate for immunotherapy. Her2was also negative,not a candidate for EGFR inhibitor. -She startedconsolidation chemotherapytreatment with FOLFOX q2weeksfor 3 monthson 3/30/20tobettercontrol her disease.Tolerating moderately well withmild cold sensitivity, nausea, mild epistaxis, mild constipation and fatigue. -We discussed PET scan from 08/30/18 which shows decrease in size and metabolic activity of distal esophageal primary carcinoma and no evidence metastatic adenopathy in the chest or abdomen. She  was pleased with the result  -Labs reviewed, CBC, CMP shows WBC 3.2, Hg 10.7, HCT 33.3, platelets 122, alkaline phosphatase 163. Overall adequate to proceed with last cycle FOLFOXandleucovorintoday. -We discussed that her esophageal cancer is not cured.  Due to her recent back pain, poor performance status, I recommend her take a chemo break for today's treatment.  She understands cancer start growing again, she may restart chemotherapy.  We also talked about option of esophageal stent if she develops worsening dysphagia. -f/u in 6 weeks, repeat CT scan in 3-4 months   2. Acute severe mid/low back pain  -MRI Thoracic and lumbar spine showed compression fracture at T3, T8, and T11 appear benign. -She was seen by neurosurgeon Dr. Sherwood Gambler who suspect her pain is not mainly from her compression fractures  -her PET scan is negative for bone mets, I think her back pain is likely related to her compression fractures -Back pain manageable with Hydrocodone q6hours and flexeril, overall improved, will continue monitoring   3. Dysphagia, nausea -secondary toher esophageal cancer and chemoradiation. -she is recovering well overall, odynophagia has resolved -She will continue Reglan,ativanandZofranas needed.  -She still has dysphasia, I encouraged her to continue soft diet, avoiding bread and meat, and chew her food very well before swallowing, continue nutritional supplement    4. RightBreastInvasive Ductal Carcinoma,cT1bN0M0 stage IA,Grade II, ER/PR Positive, HER 2 negative -Diagnosed through screening mammogram, same time as her esophageal cancer in October 2019.  -Given her early stage breast cancer, I previously discussedher breast cancer treatment is less urgent than esophogeal cancertreatment. -Given her EP/PR breast cancer and post-menopausal status,she startedantiestrogen therapy with anastrozoleon 05/07/18, and tolerating wellexcept recent tiny bruising of her breasts and  abdomen. Will monitor. -Her breasts surgery has been held due to her metastatic esophageal cancer. -Shepreviouslydeveloped mild skin rash on her chest, arms, and abdomen after she started anastrozole.  -I switched her to letrozole on 07/10/18  -Continue Letrozole  -breast surgery has been on hold due to her metastatic esophageal cancer  5.Oligo brain mets, Insomnia -This is likely from her primaryesophogealcancer.  -She does have balance issues and ambulates with assistance.  -She is s/p SBRT. Hermild slurred speech, right sided weaknessand balance stabilityhas muchimproved -Will monitor with brain  scan every 3 months. -She had a brain MRI on 2/14/2020and shows decreases in brain met but has considerable increase in edema of cerebellum -Dexa exacerbated her insomnia even with ativan to 1.5 tabs. Ipreviouslycalled in Trazodone 66m nightly as needed (06/12/18) -Her slurred speech continues to progress. She continues to exercise and gaining strength of right side. She continues to practice walking with cane although still unsteady, and with walker. She has progressing uncontrolled function of her right hand. Her memory is improving.  -Dr. VMickeal Skinnerwould like her to continue dexa 424mdaily. -Her next brain MRI is 09/12/18.     6. H/o Left breast cancer, ER/PR negative, treated by Dr. MaJana Hakimith lumpectomy, adj, chemo and radiation. -She was previously referred to genetics  -no evidence of recurrence   7.Acute deep vein thrombosis (DVT) of popliteal vein of left lower extremity, diagnosed on 02/28/2018  -She was found to have left lower extremity edema on his first day of chemoradiation, Doppler showed DVT -She iscurrentlyon coumadin 66m27mailyexcept 2.66mg47m Monday, Wednesday, Saturday.INR 3.0 today (09/04/18). Will continue same dose.  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  -Stable, did not worsen with start of FOLFOX  9.Goal of care discussion  -she is full code now -Patient understand her treatment is palliative to prolong her life,her cancer is not curable unfortunately.  10.COPD -Shehas chronic cough -She presented to ED for COPD excerebration on 04/12/18. She has recovered and her breathing is improved. -stable, has not had cough recently  11. Depressed Mood  -She has had 1 episode of feeling like she is going to die given her recent many symptoms and side effects, improved with ativan -I previously discussed the option of speaking with someone such as our sociEducation officer, museume declined for now  -I also discussed medication to help improve her mood. She has Ativan and will use that as needed for now.    PLAN: -Labs reviewed and adequate to proceed with FOLFOX and Leucovorin today, last planned dose  -Continue Coumadin 66mg 62mly except 2.66mg o77monday, Wednesday and Saturday -Refill flexeril  -Continue Letrozole daily -lab, flush and f/u in 6 weeks    No problem-specific Assessment & Plan notes found for this encounter.   No orders of the defined types were placed in this encounter.  All questions were answered. The patient knows to call the clinic with any problems, questions or concerns. No barriers to learning was detected. I spent 25 minutes counseling the patient face to face. The total time spent in the appointment was 30 minutes and more than 50% was on counseling and review of test results     Linkoln Alkire FeTruitt Merle/22/2020   I, Molly Cloyde Reamsimer, am acting as scribe for Archie Atilano FeTruitt Merle  I have reviewed the above documentation for accuracy and completeness, and I agree with the above.   Addendum  Pt developed significant dyspnea after she left our cancer center this afternoon, she returned and I saw her again in my office. VS showed BP 190/120, normal temperature, O2 sat exam showed decreased  breath sounds on both lungs, with mild wheezing.  COPD exacerbation, she received albuterol inhaler oxygen in my office.  VS stable. I recommended her to increase dexamethasone to 4 mg twice daily for 3 days, starting today.  She will use nebulizer when she gets home and as needed for wheezing and dyspnea.  She  knows to go to emergency room if her symptoms get worse, avoid cold exposure such as direct cold air from A/C. She was discharged home on stable condition.   [name]  09/04/2018 3:01 PM

## 2018-09-02 ENCOUNTER — Other Ambulatory Visit: Payer: Self-pay | Admitting: Nurse Practitioner

## 2018-09-02 DIAGNOSIS — M545 Low back pain, unspecified: Secondary | ICD-10-CM

## 2018-09-04 ENCOUNTER — Inpatient Hospital Stay: Payer: Medicare Other

## 2018-09-04 ENCOUNTER — Inpatient Hospital Stay (HOSPITAL_BASED_OUTPATIENT_CLINIC_OR_DEPARTMENT_OTHER): Payer: Medicare Other | Admitting: Hematology

## 2018-09-04 ENCOUNTER — Inpatient Hospital Stay (HOSPITAL_BASED_OUTPATIENT_CLINIC_OR_DEPARTMENT_OTHER): Payer: Medicare Other | Admitting: Medical

## 2018-09-04 ENCOUNTER — Encounter: Payer: Self-pay | Admitting: Hematology

## 2018-09-04 ENCOUNTER — Other Ambulatory Visit: Payer: Self-pay

## 2018-09-04 VITALS — BP 114/60

## 2018-09-04 VITALS — BP 180/93 | HR 95 | Temp 98.3°F | Resp 20 | Ht 60.0 in | Wt 188.9 lb

## 2018-09-04 VITALS — BP 180/106 | HR 98 | Temp 98.6°F | Resp 24

## 2018-09-04 DIAGNOSIS — J449 Chronic obstructive pulmonary disease, unspecified: Secondary | ICD-10-CM

## 2018-09-04 DIAGNOSIS — C159 Malignant neoplasm of esophagus, unspecified: Secondary | ICD-10-CM | POA: Diagnosis not present

## 2018-09-04 DIAGNOSIS — R918 Other nonspecific abnormal finding of lung field: Secondary | ICD-10-CM

## 2018-09-04 DIAGNOSIS — I700 Atherosclerosis of aorta: Secondary | ICD-10-CM

## 2018-09-04 DIAGNOSIS — R110 Nausea: Secondary | ICD-10-CM | POA: Diagnosis not present

## 2018-09-04 DIAGNOSIS — I10 Essential (primary) hypertension: Secondary | ICD-10-CM

## 2018-09-04 DIAGNOSIS — I824Z2 Acute embolism and thrombosis of unspecified deep veins of left distal lower extremity: Secondary | ICD-10-CM

## 2018-09-04 DIAGNOSIS — Z5111 Encounter for antineoplastic chemotherapy: Secondary | ICD-10-CM

## 2018-09-04 DIAGNOSIS — Z7901 Long term (current) use of anticoagulants: Secondary | ICD-10-CM

## 2018-09-04 DIAGNOSIS — G720 Drug-induced myopathy: Secondary | ICD-10-CM | POA: Diagnosis not present

## 2018-09-04 DIAGNOSIS — N281 Cyst of kidney, acquired: Secondary | ICD-10-CM | POA: Diagnosis not present

## 2018-09-04 DIAGNOSIS — R55 Syncope and collapse: Secondary | ICD-10-CM

## 2018-09-04 DIAGNOSIS — F329 Major depressive disorder, single episode, unspecified: Secondary | ICD-10-CM

## 2018-09-04 DIAGNOSIS — C50111 Malignant neoplasm of central portion of right female breast: Secondary | ICD-10-CM

## 2018-09-04 DIAGNOSIS — M545 Low back pain, unspecified: Secondary | ICD-10-CM

## 2018-09-04 DIAGNOSIS — Z923 Personal history of irradiation: Secondary | ICD-10-CM

## 2018-09-04 DIAGNOSIS — C7931 Secondary malignant neoplasm of brain: Secondary | ICD-10-CM

## 2018-09-04 DIAGNOSIS — G629 Polyneuropathy, unspecified: Secondary | ICD-10-CM

## 2018-09-04 DIAGNOSIS — Z79899 Other long term (current) drug therapy: Secondary | ICD-10-CM

## 2018-09-04 DIAGNOSIS — E785 Hyperlipidemia, unspecified: Secondary | ICD-10-CM

## 2018-09-04 DIAGNOSIS — R1310 Dysphagia, unspecified: Secondary | ICD-10-CM

## 2018-09-04 DIAGNOSIS — I82432 Acute embolism and thrombosis of left popliteal vein: Secondary | ICD-10-CM | POA: Diagnosis not present

## 2018-09-04 DIAGNOSIS — R0602 Shortness of breath: Secondary | ICD-10-CM

## 2018-09-04 DIAGNOSIS — I739 Peripheral vascular disease, unspecified: Secondary | ICD-10-CM

## 2018-09-04 DIAGNOSIS — J441 Chronic obstructive pulmonary disease with (acute) exacerbation: Secondary | ICD-10-CM

## 2018-09-04 LAB — CMP (CANCER CENTER ONLY)
ALT: 25 U/L (ref 0–44)
AST: 18 U/L (ref 15–41)
Albumin: 3.3 g/dL — ABNORMAL LOW (ref 3.5–5.0)
Alkaline Phosphatase: 163 U/L — ABNORMAL HIGH (ref 38–126)
Anion gap: 12 (ref 5–15)
BUN: 18 mg/dL (ref 8–23)
CO2: 26 mmol/L (ref 22–32)
Calcium: 9.1 mg/dL (ref 8.9–10.3)
Chloride: 97 mmol/L — ABNORMAL LOW (ref 98–111)
Creatinine: 0.85 mg/dL (ref 0.44–1.00)
GFR, Est AFR Am: >60 mL/min (ref >60)
GFR, Estimated: >60 mL/min (ref >60)
Glucose, Bld: 86 mg/dL (ref 70–99)
Potassium: 3.5 mmol/L (ref 3.5–5.1)
Sodium: 135 mmol/L (ref 135–145)
Total Bilirubin: 0.6 mg/dL (ref 0.3–1.2)
Total Protein: 6.4 g/dL — ABNORMAL LOW (ref 6.5–8.1)

## 2018-09-04 LAB — CBC WITH DIFFERENTIAL (CANCER CENTER ONLY)
Abs Immature Granulocytes: 0.08 10*3/uL — ABNORMAL HIGH (ref 0.00–0.07)
Basophils Absolute: 0 10*3/uL (ref 0.0–0.1)
Basophils Relative: 0 %
Eosinophils Absolute: 0 10*3/uL (ref 0.0–0.5)
Eosinophils Relative: 0 %
HCT: 33.3 % — ABNORMAL LOW (ref 36.0–46.0)
Hemoglobin: 10.7 g/dL — ABNORMAL LOW (ref 12.0–15.0)
Immature Granulocytes: 3 %
Lymphocytes Relative: 4 %
Lymphs Abs: 0.1 10*3/uL — ABNORMAL LOW (ref 0.7–4.0)
MCH: 33.2 pg (ref 26.0–34.0)
MCHC: 32.1 g/dL (ref 30.0–36.0)
MCV: 103.4 fL — ABNORMAL HIGH (ref 80.0–100.0)
Monocytes Absolute: 0.5 10*3/uL (ref 0.1–1.0)
Monocytes Relative: 14 %
Neutro Abs: 2.5 10*3/uL (ref 1.7–7.7)
Neutrophils Relative %: 79 %
Platelet Count: 122 10*3/uL — ABNORMAL LOW (ref 150–400)
RBC: 3.22 MIL/uL — ABNORMAL LOW (ref 3.87–5.11)
RDW: 17 % — ABNORMAL HIGH (ref 11.5–15.5)
WBC Count: 3.2 10*3/uL — ABNORMAL LOW (ref 4.0–10.5)
nRBC: 1.6 % — ABNORMAL HIGH (ref 0.0–0.2)

## 2018-09-04 LAB — PROTIME-INR
INR: 3 — ABNORMAL HIGH (ref 0.8–1.2)
Prothrombin Time: 30.4 seconds — ABNORMAL HIGH (ref 11.4–15.2)

## 2018-09-04 MED ORDER — DEXTROSE 5 % IV SOLN
Freq: Once | INTRAVENOUS | Status: AC
  Administered 2018-09-04: 10:00:00 via INTRAVENOUS
  Filled 2018-09-04: qty 250

## 2018-09-04 MED ORDER — PALONOSETRON HCL INJECTION 0.25 MG/5ML
0.2500 mg | Freq: Once | INTRAVENOUS | Status: AC
  Administered 2018-09-04: 0.25 mg via INTRAVENOUS

## 2018-09-04 MED ORDER — ALBUTEROL SULFATE HFA 108 (90 BASE) MCG/ACT IN AERS
2.0000 | INHALATION_SPRAY | Freq: Once | RESPIRATORY_TRACT | Status: AC
  Administered 2018-09-04: 2 via RESPIRATORY_TRACT

## 2018-09-04 MED ORDER — SODIUM CHLORIDE 0.9 % IV SOLN
2400.0000 mg/m2 | INTRAVENOUS | Status: DC
  Administered 2018-09-04: 4600 mg via INTRAVENOUS
  Filled 2018-09-04: qty 92

## 2018-09-04 MED ORDER — OXALIPLATIN CHEMO INJECTION 100 MG/20ML
75.0000 mg/m2 | Freq: Once | INTRAVENOUS | Status: AC
  Administered 2018-09-04: 145 mg via INTRAVENOUS
  Filled 2018-09-04: qty 20

## 2018-09-04 MED ORDER — LEUCOVORIN CALCIUM INJECTION 350 MG
400.0000 mg/m2 | Freq: Once | INTRAVENOUS | Status: AC
  Administered 2018-09-04: 764 mg via INTRAVENOUS
  Filled 2018-09-04: qty 38.2

## 2018-09-04 MED ORDER — SODIUM CHLORIDE 0.9 % IV SOLN
Freq: Once | INTRAVENOUS | Status: AC
  Administered 2018-09-04: 11:00:00 via INTRAVENOUS
  Filled 2018-09-04: qty 5

## 2018-09-04 MED ORDER — CYCLOBENZAPRINE HCL 5 MG PO TABS: 5.0000 mg | ORAL_TABLET | Freq: Three times a day (TID) | ORAL | 0 refills | Status: DC | PRN

## 2018-09-04 NOTE — Progress Notes (Signed)
Pt finished with tx today and was heading to exit when she began reporting extreme SOB and wheezing.  Pt on 30fu pump after finishing Folfirinox tx.  No rash or swelling or trouble swallowing.  Afebrile.  VS reviewed with PA Lucianne Lei and MD Burr Medico when pt brought back to [facility] for assessment.  Hx COPD.  Pt placed on 2L [place] but sats remained above 97% with and without it.  Pt received 2 puffs albuterol inhaler in tx room.  Tolerated well.  VSS.  Reported feeling better after inhaler use.  Mild wheezing no longer heard in lungs by RN.  PA Van/MD Burr Medico reassessed.  Pt cleared to go home with instructions to make sure her mouth is covered whenever cold air may possibly be directed at her (oxali) and to use her nebulizer tx at home as needed, inhaler at home as needed, and to increase her decadron dose for next 3 days.  Pt verbalized understanding of d/c and f/u instructions.  WC to exit with belongings.

## 2018-09-04 NOTE — Patient Instructions (Signed)
Chronic Obstructive Pulmonary Disease Chronic obstructive pulmonary disease (COPD) is a long-term (chronic) lung problem. When you have COPD, it is hard for air to get in and out of your lungs. Usually the condition gets worse over time, and your lungs will never return to normal. There are things you can do to keep yourself as healthy as possible.  Your doctor may treat your condition with: ? Medicines. ? Oxygen. ? Lung surgery.  Your doctor may also recommend: ? Rehabilitation. This includes steps to make your body work better. It may involve a team of specialists. ? Quitting smoking, if you smoke. ? Exercise and changes to your diet. ? Comfort measures (palliative care). Follow these instructions at home: Medicines  Take over-the-counter and prescription medicines only as told by your doctor.  Talk to your doctor before taking any cough or allergy medicines. You may need to avoid medicines that cause your lungs to be dry. Lifestyle  If you smoke, stop. Smoking makes the problem worse. If you need help quitting, ask your doctor.  Avoid being around things that make your breathing worse. This may include smoke, chemicals, and fumes.  Stay active, but remember to rest as well.  Learn and use tips on how to relax.  Make sure you get enough sleep. Most adults need at least 7 hours of sleep every night.  Eat healthy foods. Eat smaller meals more often. Rest before meals. Controlled breathing Learn and use tips on how to control your breathing as told by your doctor. Try:  Breathing in (inhaling) through your nose for 1 second. Then, pucker your lips and breath out (exhale) through your lips for 2 seconds.  Putting one hand on your belly (abdomen). Breathe in slowly through your nose for 1 second. Your hand on your belly should move out. Pucker your lips and breathe out slowly through your lips. Your hand on your belly should move in as you breathe out.  Controlled coughing Learn  and use controlled coughing to clear mucus from your lungs. Follow these steps: 1. Lean your head a little forward. 2. Breathe in deeply. 3. Try to hold your breath for 3 seconds. 4. Keep your mouth slightly open while coughing 2 times. 5. Spit any mucus out into a tissue. 6. Rest and do the steps again 1 or 2 times as needed. General instructions  Make sure you get all the shots (vaccines) that your doctor recommends. Ask your doctor about a flu shot and a pneumonia shot.  Use oxygen therapy and pulmonary rehabilitation if told by your doctor. If you need home oxygen therapy, ask your doctor if you should buy a tool to measure your oxygen level (oximeter).  Make a COPD action plan with your doctor. This helps you to know what to do if you feel worse than usual.  Manage any other conditions you have as told by your doctor.  Avoid going outside when it is very hot, cold, or humid.  Avoid people who have a sickness you can catch (contagious).  Keep all follow-up visits as told by your doctor. This is important. Contact a doctor if:  You cough up more mucus than usual.  There is a change in the color or thickness of the mucus.  It is harder to breathe than usual.  Your breathing is faster than usual.  You have trouble sleeping.  You need to use your medicines more often than usual.  You have trouble doing your normal activities such as getting dressed   or walking around the house. Get help right away if:  You have shortness of breath while resting.  You have shortness of breath that stops you from: ? Being able to talk. ? Doing normal activities.  Your chest hurts for longer than 5 minutes.  Your skin color is more blue than usual.  Your pulse oximeter shows that you have low oxygen for longer than 5 minutes.  You have a fever.  You feel too tired to breathe normally. Summary  Chronic obstructive pulmonary disease (COPD) is a long-term lung problem.  The way your  lungs work will never return to normal. Usually the condition gets worse over time. There are things you can do to keep yourself as healthy as possible.  Take over-the-counter and prescription medicines only as told by your doctor.  If you smoke, stop. Smoking makes the problem worse. This information is not intended to replace advice given to you by your health care provider. Make sure you discuss any questions you have with your health care provider. Document Released: 08/18/2007 Document Revised: 04/05/2016 Document Reviewed: 04/05/2016 Elsevier Interactive Patient Education  2019 [employer]  

## 2018-09-04 NOTE — Progress Notes (Signed)
Per Dr. Burr Medico, [place] to treat with platelet count 122 today.

## 2018-09-04 NOTE — Patient Instructions (Signed)
[facility] Discharge Instructions for Patients Receiving Chemotherapy  Today you received the following chemotherapy agents Oxaliplatin, Leucovorin, 5-FU. To help prevent nausea and vomiting after your treatment, we encourage you to take your nausea medication. DO NOT TAKE ZOFRAN FOR THREE DAYS AFTER TREATMENT.   If you develop nausea and vomiting that is not controlled by your nausea medication, call the clinic.   BELOW ARE SYMPTOMS THAT SHOULD BE REPORTED IMMEDIATELY:  *FEVER GREATER THAN 100.5 F  *CHILLS WITH OR WITHOUT FEVER  NAUSEA AND VOMITING THAT IS NOT CONTROLLED WITH YOUR NAUSEA MEDICATION  *UNUSUAL SHORTNESS OF BREATH  *UNUSUAL BRUISING OR BLEEDING  TENDERNESS IN MOUTH AND THROAT WITH OR WITHOUT PRESENCE OF ULCERS  *URINARY PROBLEMS  *BOWEL PROBLEMS  UNUSUAL RASH Items with * indicate a potential emergency and should be followed up as soon as possible.  Feel free to call the clinic should you have any questions or concerns. The clinic phone number is [phone].  Please show the [facility] at check-in to the Emergency Department and triage nurse.

## 2018-09-05 ENCOUNTER — Telehealth: Payer: Self-pay | Admitting: Medical

## 2018-09-05 ENCOUNTER — Telehealth: Payer: Self-pay | Admitting: Hematology

## 2018-09-05 NOTE — Telephone Encounter (Signed)
No los per 6/22. °

## 2018-09-05 NOTE — Progress Notes (Signed)
Kristin Williams was treated with FOLFIRINOX today. She has a h/o COPD and began to have SOB and wheezing as she was leaving today. She was assessed by Dr. Burr Medico. She was placed on oxygen at 2 LPM with her SPO2 at 97%. She was treated with an albuterol inhaler. She has an albuterol nebulizer at home along with Symbicort. Dr. Burr Medico reviewed her current decadron dose and had Kristin Williams increase her decadron dose for next 3 days. Her SOB improved.   She verbalized understanding of instructions given and was transported to her ride in a WC.  of d/c and f/u instructions.  WC to exit with belongings.  Sandi Mealy, MHS, PA-C Physician Assistant

## 2018-09-05 NOTE — Telephone Encounter (Signed)
Refill already responded to 09-04-2018.  [facility] not contacted in reference to refusal of this request for refill duplicate.

## 2018-09-06 ENCOUNTER — Other Ambulatory Visit: Payer: Self-pay

## 2018-09-06 ENCOUNTER — Other Ambulatory Visit: Payer: Self-pay | Admitting: Hematology

## 2018-09-06 ENCOUNTER — Inpatient Hospital Stay: Payer: Medicare Other

## 2018-09-06 VITALS — BP 162/91 | HR 89 | Temp 98.6°F | Resp 18

## 2018-09-06 DIAGNOSIS — G720 Drug-induced myopathy: Secondary | ICD-10-CM | POA: Diagnosis not present

## 2018-09-06 DIAGNOSIS — Z5111 Encounter for antineoplastic chemotherapy: Secondary | ICD-10-CM | POA: Diagnosis not present

## 2018-09-06 DIAGNOSIS — C50111 Malignant neoplasm of central portion of right female breast: Secondary | ICD-10-CM | POA: Diagnosis not present

## 2018-09-06 DIAGNOSIS — C159 Malignant neoplasm of esophagus, unspecified: Secondary | ICD-10-CM | POA: Diagnosis not present

## 2018-09-06 DIAGNOSIS — I82432 Acute embolism and thrombosis of left popliteal vein: Secondary | ICD-10-CM | POA: Diagnosis not present

## 2018-09-06 DIAGNOSIS — C7931 Secondary malignant neoplasm of brain: Secondary | ICD-10-CM | POA: Diagnosis not present

## 2018-09-06 MED ORDER — HEPARIN SOD (PORK) LOCK FLUSH 100 UNIT/ML IV SOLN
500.0000 [IU] | Freq: Once | INTRAVENOUS | Status: AC | PRN
  Administered 2018-09-06: 500 [IU]
  Filled 2018-09-06: qty 5

## 2018-09-06 MED ORDER — SODIUM CHLORIDE 0.9% FLUSH
10.0000 mL | INTRAVENOUS | Status: DC | PRN
  Administered 2018-09-06: 10 mL
  Filled 2018-09-06: qty 10

## 2018-09-08 ENCOUNTER — Telehealth: Payer: Self-pay | Admitting: *Deleted

## 2018-09-08 NOTE — Telephone Encounter (Signed)
I called Kim back. She is concerned about pt's mental status, more irritable, repeating sentences, slightly worsening back pain and edema, she is taking meds as instructed. I told Maudie Mercury to bring her in to ED if her symptoms get worse over the weekend, no change of her medication for now, and I will inform Dr. Mickeal Skinner, pt is scheduled to have brain MRI and see Dr. Mickeal Skinner on Tuesday 6/30. She agrees with the plan.   [name] MD

## 2018-09-08 NOTE — Telephone Encounter (Signed)
Received call from daughter Joelene Millin requesting to talk to nurse.  Spoke with Joelene Millin and was informed that for past 2 days, pt has increased back pain - takes  Norco  7.5-325  1 tab every 6 hours with not much relief.  Joelene Millin stated pt has been very agitated, very irritable,  Unable to walk much due to 3+ edema in LEs.  Pt has more swelling in her face;  Denied shortness of breath per daughter.    Fluid intake might be decreased due to pt not wanting to go to bathroom much.  Joelene Millin thinks symptoms might be related to brain tumor. Joelene Millin would really like to speak with Dr. Burr Medico if possible. [facility]   Phone     [phone].

## 2018-09-11 ENCOUNTER — Telehealth: Payer: Self-pay

## 2018-09-11 ENCOUNTER — Other Ambulatory Visit: Payer: Self-pay | Admitting: Nurse Practitioner

## 2018-09-11 DIAGNOSIS — M545 Low back pain, unspecified: Secondary | ICD-10-CM

## 2018-09-11 MED ORDER — HYDROCODONE-ACETAMINOPHEN 7.5-325 MG PO TABS: 1.0000 | ORAL_TABLET | Freq: Four times a day (QID) | ORAL | 0 refills | Status: DC | PRN

## 2018-09-11 NOTE — Telephone Encounter (Signed)
Patient's daughter Joelene Millin calls stating she spoke with Dr. Burr Medico on Friday, back pain is worse, taking Hydrocodone and Flexeril every 6 hours, irritability has increased, patient is scheduled for brain MRI tomorrow 6/30, but she is unsure if she can lay still for this.  She is seeking advice on what to do at this point.  [phone]

## 2018-09-11 NOTE — Telephone Encounter (Signed)
Spoke with Kristin Williams after consulting with Dr. Burr Medico, per Dr. Burr Medico okay to increase frequency of Hydrocodone to every 4 hours, imperative that she get brain MRI, per Dr. Burr Medico take a whole Ativan 30 to 45 minutes prior to scan, take another one with her in case the first does not work.  She verbalized an understanding.  Received a call after this conversation from the patient that she needs a refill on Hydrocodone.  Notified Dr. Burr Medico.

## 2018-09-12 ENCOUNTER — Other Ambulatory Visit: Payer: Self-pay

## 2018-09-12 ENCOUNTER — Telehealth: Payer: Self-pay | Admitting: Internal Medicine

## 2018-09-12 ENCOUNTER — Inpatient Hospital Stay (HOSPITAL_BASED_OUTPATIENT_CLINIC_OR_DEPARTMENT_OTHER): Payer: Medicare Other | Admitting: Internal Medicine

## 2018-09-12 ENCOUNTER — Ambulatory Visit
Admit: 2018-09-12 | Discharge: 2018-09-12 | Disposition: A | Discharge status: Home / Self Care | Payer: Medicare Other | Attending: Internal Medicine | Admitting: Internal Medicine

## 2018-09-12 DIAGNOSIS — C159 Malignant neoplasm of esophagus, unspecified: Secondary | ICD-10-CM | POA: Diagnosis not present

## 2018-09-12 DIAGNOSIS — C50111 Malignant neoplasm of central portion of right female breast: Secondary | ICD-10-CM

## 2018-09-12 DIAGNOSIS — Z923 Personal history of irradiation: Secondary | ICD-10-CM

## 2018-09-12 DIAGNOSIS — T380X5A Adverse effect of glucocorticoids and synthetic analogues, initial encounter: Secondary | ICD-10-CM

## 2018-09-12 DIAGNOSIS — F329 Major depressive disorder, single episode, unspecified: Secondary | ICD-10-CM | POA: Diagnosis not present

## 2018-09-12 DIAGNOSIS — M546 Pain in thoracic spine: Secondary | ICD-10-CM

## 2018-09-12 DIAGNOSIS — I82432 Acute embolism and thrombosis of left popliteal vein: Secondary | ICD-10-CM

## 2018-09-12 DIAGNOSIS — Z7901 Long term (current) use of anticoagulants: Secondary | ICD-10-CM

## 2018-09-12 DIAGNOSIS — Z5111 Encounter for antineoplastic chemotherapy: Secondary | ICD-10-CM | POA: Diagnosis not present

## 2018-09-12 DIAGNOSIS — Z79899 Other long term (current) drug therapy: Secondary | ICD-10-CM

## 2018-09-12 DIAGNOSIS — I739 Peripheral vascular disease, unspecified: Secondary | ICD-10-CM

## 2018-09-12 DIAGNOSIS — G720 Drug-induced myopathy: Secondary | ICD-10-CM | POA: Diagnosis not present

## 2018-09-12 DIAGNOSIS — R1310 Dysphagia, unspecified: Secondary | ICD-10-CM

## 2018-09-12 DIAGNOSIS — N281 Cyst of kidney, acquired: Secondary | ICD-10-CM

## 2018-09-12 DIAGNOSIS — R918 Other nonspecific abnormal finding of lung field: Secondary | ICD-10-CM

## 2018-09-12 DIAGNOSIS — J449 Chronic obstructive pulmonary disease, unspecified: Secondary | ICD-10-CM | POA: Diagnosis not present

## 2018-09-12 DIAGNOSIS — C801 Malignant (primary) neoplasm, unspecified: Secondary | ICD-10-CM | POA: Diagnosis not present

## 2018-09-12 DIAGNOSIS — R110 Nausea: Secondary | ICD-10-CM | POA: Diagnosis not present

## 2018-09-12 DIAGNOSIS — Z87891 Personal history of nicotine dependence: Secondary | ICD-10-CM

## 2018-09-12 DIAGNOSIS — G629 Polyneuropathy, unspecified: Secondary | ICD-10-CM

## 2018-09-12 DIAGNOSIS — C7931 Secondary malignant neoplasm of brain: Secondary | ICD-10-CM

## 2018-09-12 DIAGNOSIS — R55 Syncope and collapse: Secondary | ICD-10-CM

## 2018-09-12 DIAGNOSIS — I700 Atherosclerosis of aorta: Secondary | ICD-10-CM

## 2018-09-12 DIAGNOSIS — E785 Hyperlipidemia, unspecified: Secondary | ICD-10-CM

## 2018-09-12 MED ORDER — DEXAMETHASONE 1 MG PO TABS: ORAL_TABLET | ORAL | 1 refills | Status: DC

## 2018-09-12 MED ORDER — GADOBENATE DIMEGLUMINE 529 MG/ML IV SOLN
17.0000 mL | Freq: Once | INTRAVENOUS | Status: AC | PRN
  Administered 2018-09-12: 17 mL via INTRAVENOUS

## 2018-09-12 NOTE — Telephone Encounter (Signed)
Scheduled appt per 6/30 los. Spoke with patient and she is aware of her appt date and time. °

## 2018-09-12 NOTE — Progress Notes (Signed)
[facility] [place], [place] [address] [phone]   Interval Evaluation  Date of Service: 09/12/18 Patient Name: Kristin Williams Patient MRN: [id number] Patient DOB: 1945-04-03 Provider: Ventura Sellers, MD  Identifying Statement:  Kristin Williams is a 73 y.o. 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   01/30/2018 Initial Diagnosis   Metastasis to brain [facility])   01/31/2018 Imaging   MRI Brain W WO Contrast 01/31/18  IMPRESSION: 3 Tesla study does not disclose any additional lesions. Solitary metastasis in the right superior cerebellum measuring 2.9 x 2.6 x 3.0 cm with mild surrounding edema. 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   02/03/2018 - 02/08/2018 Radiation Therapy   SBRT with Dr. Isidore Moos on 02/03/18, 02/06/18, 02/08/18   Esophageal cancer, stage IV ([place])  01/08/2018 Imaging   CT AP W Constrast 01/08/18  IMPRESSION: 1. Nonobstructed, nondistended bowel. No acute inflammatory process. 2. Moderate-sized hiatal hernia noted. 3. Low-density 1.7 cm left adrenal nodule likely to represent a small adenoma. Lower pole left renal cyst measuring 1.8 cm. 4. Moderate aortic atherosclerosis. 5. Lumbar spondylosis and facet arthropathy.   01/27/2018 Imaging   CT Chest  W Contrast 01/27/18  IMPRESSION: 1. Large mass involving the distal half of the esophagus spanning approximately 11.7 cm is identified compatible with primary esophageal neoplasm. 2. Small posterior mediastinal and high right paratracheal lymph nodes are noted which may represent foci of metastatic adenopathy. 3. The small pulmonary nodules in the right lung are unchanged from 01/15/2016 and are favored to represent a benign process. 4. Aortic Atherosclerosis (ICD10-I70.0) and Emphysema (ICD10-J43.9). 5. Coronary artery atherosclerotic calcifications.   01/30/2018 Initial Diagnosis   Esophageal cancer, stage IV ([place])   02/03/2018 - 02/08/2018 Radiation Therapy   SBRT to oligo brain metastasis, in 3 fractions, under Dr. Isidore Moos    02/16/2018 PET scan   PET 02/16/18 IMPRESSION: 1. A 15.7 cm in length esophageal mass extends from the aortic arch level down to the distal esophagus, maximum SUV 22.4, compatible with malignancy. There is a small right lower neck level IV lymph node with lower than mediastinal blood pool activity, and a right upper paratracheal lymph node with just above mediastinal blood pool activity. 2. Faint speckled heterogeneity in the liver, for example in the lateral segment left hepatic lobe, is likely incidental/benign given the lack of correlate of CT finding. It might be prudent to obtain an MRI just to make sure that there is not a small early metastatic lesion in this vicinity. 3. Known right cerebellar vermian metastatic lesion, with expected hypo activity compared to the surrounding brain activity. 4. The right middle lobe pulmonary nodule measures 1.1 by 1.0 cm and is mildly hypermetabolic with maximum SUV 2.6. This lesion measured 6 by 5 mm back on 12/06/2003 and 1.2 by 1.1 cm on 10/15/2015. I am uncertain what to make of  the very slow growth and mildly accentuated metabolic activity. This could be a chronic granulomatous process. Low-grade  adenocarcinoma of the lung seems unlikely to be so indolent as to only mildly increase over a 14 year period, but is presumably a differential diagnostic consideration. Surveillance is suggested. 5. Other imaging findings of potential clinical significance: Aortic Atherosclerosis (ICD10-I70.0). Descending and sigmoid colon diverticulosis. Gas in the urinary bladder, most common cause would be recent catheterization. Minimal chronic left ethmoid sinusitis.   02/27/2018 - 04/09/2018 Chemotherapy   Concurrent chemoRT with weekly carboplatin and Taxol 02/27/2018-04/03/18   02/27/2018 - 04/07/2018 Radiation Therapy   Concurrent ChemoRT 02/27/18-04/07/18   06/12/2018 -  Chemotherapy   Pending FOLFOX q2weeks starting 06/12/18   08/31/2018 PET scan      PET 08/30/18 IMPRESSION: 1. Decrease in size and metabolic activity of distal esophageal primary carcinoma. 2. No evidence metastatic adenopathy in the chest or abdomen. 3. Lesion in the cerebellum again noted. 4. Stable pulmonary nodule RIGHT middle lobe is not related to esophageal carcinoma as present in 2005 (smaller then). Favor indolent neoplasm such as carcinoid or hamartoma.   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 Ultrasound-guided biopsy is recommended.   01/10/2018 Initial Biopsy   Diagnsis from Breast Biopsy 01/10/18  Breast, right, needle core biopsy, 6 o'clock - INVASIVE DUCTAL CARCINOMA WITH CALCIFICATIONS, SEE COMMENT. - DUCTAL CARCINOMA IN SITU. 1 of 2 FINAL for Kristin Williams, Kristin Williams [phone]) Microscopic Comment The carcinoma appears grade 2. Prognostic markers will be ordered. Dr. [name] has reviewed the case. The case was called to [place] on 01/11/2018.   01/10/2018 Receptors her2   The tumor cells are NEGATIVE for Her2 (1+). Estrogen Receptor: 100%, POSITIVE, STRONG STAINING INTENSITY Progesterone Receptor: 100%, POSITIVE, STRONG STAINING INTENSITY Proliferation Marker Ki67: 15%   01/10/2018 Cancer Staging   Staging form: Breast, AJCC 8th Edition - Clinical stage from 01/10/2018: Stage IA (cT1b, cN0, cM0, G2, ER+, PR+, HER2-) - Signed by [name], MD on 02/03/2018   01/30/2018 Initial Diagnosis   Breast cancer ([place])   05/07/2018 -  Anti-estrogen oral therapy   Anastrozole 55m daily starting 05/07/2018     Interval History:  BQUINTA EIMERpresents for follow up today.  She complains of persistent mid-back pain for the past several months, at times quite severe.  Also describes worsening gait impairment secndary to pain but also weakness in her legs, particularly when standing up out of chair.  Also has "puffy face" and swollen legs.  She continues on 446mdaily decadron in accordance with our prior instructions.  FOLFIRI chemo has been relatively poorly tolerated, although she is in a "break" period right now.    H+P (05/09/18) Patient presents today to discuss neurologic symptoms from brain metastasis.  She describes onset of gait instability, imbalance, and slurred speech in November 2019, which led to uncovering of large metastatic deposit in the cerebellum.  This was treated with fractionated radiosurgery, which did cause temporary improvement in symptomatology and overall functional status.  Steroids were successfully weaned following radiation, but later restarted by Dr. FeBurr MedicoStarting ~2 months later, she began to experience recurrent decline in gait and speech.  MRI demonstrated inflammatory process around radiosurgery site, and decadron was increased  to 69m TID with plan for gradual taper by Dr. SIsidore Moos  Now one week into therapy, she has not noticed any improvement.  She continues to ambulate with a  walker, but denies any issues with memory, cognition, language, or coordination.  She does describe increased difficulty getting up stairs and up from the commode.  Otherwise able to write, use utensils, and button her shirt.  Currently on anastrozole for breast ca and will discuss today with Dr. FBurr Medicoplans for systemic therapy for esophageal cancer, now having completed local radiation.     Medications: Current Outpatient Medications on File Prior to Visit  Medication Sig Dispense Refill   albuterol (PROVENTIL HFA;VENTOLIN HFA) 108 (90 Base) MCG/ACT inhaler Inhale 2 puffs into the lungs every 6 (six) hours as needed for wheezing or shortness of breath.     atorvastatin (LIPITOR) 20 MG tablet Take 20 mg by mouth daily.     budesonide-formoterol (SYMBICORT) 160-4.5 MCG/ACT inhaler Inhale 1 puff into the lungs 2 (two) times daily.     cyclobenzaprine (FLEXERIL) 5 MG tablet Take 1 tablet (5 mg total) by mouth 3 (three) times daily as needed for muscle spasms. 270 tablet 0   furosemide (LASIX) 20 MG tablet Take 2 tablets (40 mg total) by mouth daily. 60 tablet 0   HYDROcodone-acetaminophen (NORCO) 7.5-325 MG tablet Take 1 tablet by mouth every 6 (six) hours as needed for moderate pain. 45 tablet 0   letrozole (FEMARA) 2.5 MG tablet Take 1 tablet (2.5 mg total) by mouth daily. 30 tablet 3   lidocaine (XYLOCAINE) 2 % solution MIX 1 PART LIDOCAINE, 1 PART H20 SWALLOW 10ML OF DILUTED MIXTURE, 20 MINUTES BEFORE MEALS AND AT BEDTIME UP TO 3 TIMES DAILY. 300 mL 0   lidocaine-prilocaine (EMLA) cream Apply 1 application topically as needed. (Patient taking differently: Apply 1 application topically as needed (port). ) 30 g 1   LORazepam (ATIVAN) 1 MG tablet Take 0.5 tablets (0.5 mg total) by mouth 3 (three) times daily as needed for anxiety or sleep. 45 tablet 0   magic mouthwash SOLN Take 5 mLs by mouth 4 (four) times daily. 60 mL 0   metoCLOPramide (REGLAN) 10 MG tablet Take 1 tablet (10 mg total)  by mouth every 6 (six) hours as needed. for nausea 60 tablet 1   nystatin (MYCOSTATIN) 100000 UNIT/ML suspension TAKE  5 ML BY MOUTH 4 TIMES DAILY 60 mL 0   omeprazole (PRILOSEC) 40 MG capsule Take 40 mg by mouth daily.     potassium chloride SA (K-DUR) 20 MEQ tablet Take 1 tablet (20 mEq total) by mouth daily. 30 tablet 0   sucralfate (CARAFATE) 1 g tablet DISSOLVE ONE TABLET IN 10 MLS OF WATER AND SWALLOW UP TO FOUR TIMES DAILY TO SOOTHE THROAT (Patient taking differently: Take 1 g by mouth See admin instructions. DISSOLVE ONE TABLET IN 10 MLS OF WATER AND SWALLOW UP TO FOUR TIMES DAILY TO SOOTHE THROAT) 40 tablet 2   warfarin (COUMADIN) 5 MG tablet Take 0.5-1 tablets (2.5-5 mg total) by mouth See admin instructions. 2.5 MG on Monday, Wednesday and Saturday 5 Mg on Sunday, Tuesday, Thursday and Friday 30 tablet 2   ondansetron (ZOFRAN) 8 MG tablet Take 1 tablet (8 mg total) by mouth every 8 (eight) hours as needed for nausea or vomiting. (Patient not taking: Reported on 09/12/2018) 20 tablet 0   predniSONE (DELTASONE) 50 MG tablet . (Patient not taking: Reported on 09/12/2018) 4 tablet 0   traZODone (DESYREL) 50  MG tablet Take 1 tablet (50 mg total) by mouth at bedtime. (Patient not taking: Reported on 09/12/2018) 30 tablet 1   No current facility-administered medications on file prior to visit.     Allergies:  Allergies  Allergen Reactions   Flonase [Fluticasone Propionate] Other (See Comments)    Nose bleeds   Past Medical History:  Past Medical History:  Diagnosis Date   Arthritis    Cancer ([place])    Cervical   Cancer ([place])    Breast   Cancer ([place])    Vaginal   Complication of anesthesia    COPD (chronic obstructive pulmonary disease) ([place])    Headache(784.0)    History of radiation therapy 02/28/19- 04/07/18   Esophagus, 1.8 Gy in 28 fractions for a total dose of 50.4 Gy.    History of radiation therapy 02/03/18- 02/08/18   Brain Rt Sup Cerebellum// 6 FFF  photons.    Hyperlipidemia    Leg pain    Peripheral vascular disease ([facility])    Personal history of chemotherapy    Personal history of radiation therapy    PONV (postoperative nausea and vomiting)    Past Surgical History:  Past Surgical History:  Procedure Laterality Date   ABDOMINAL HYSTERECTOMY  1975   BREAST LUMPECTOMY  2005   [place]  04/20/2004   CSD right iliac occlusive disease   IR IMAGING GUIDED PORT INSERTION  02/22/2018   Social History:  Social History   Socioeconomic History   Marital status: Married    Spouse name: Not on file   Number of children: 2   Years of education: Not on file   Highest education level: Not on file  Occupational History   Not on file  Social Needs   Financial resource strain: Not on file   Food insecurity    Worry: Not on file    Inability: Not on file   Transportation needs    Medical: No    Non-medical: No  Tobacco Use   Smoking status: Former Smoker    Packs/day: 0.50    Years: 40.00    Pack years: 20.00    Types: Cigarettes    Quit date: 01/27/2018    Years since quitting: 0.6   Smokeless tobacco: Never Used   Tobacco comment: She admits to smoking a few puffs daily- 05/02/18  Substance and Sexual Activity   Alcohol use: No   Drug use: No   Sexual activity: Not Currently  Lifestyle   Physical activity    Days per week: Not on file    Minutes per session: Not on file   Stress: Not on file  Relationships   Social connections    Talks on phone: Not on file    Gets together: Not on file    Attends religious service: Not on file    Active member of club or organization: Not on file    Attends meetings of clubs or organizations: Not on file    Relationship status: Not on file   Intimate partner violence    Fear of current or ex partner: No    Emotionally abused: No    Physically abused: No    Forced sexual activity: No  Other Topics Concern     Not on file  Social History Narrative   Resides in [place]. Resides with her husband.    Family History:  Family History  Problem Relation Age of Onset   Cancer  Mother 2       Non-Hodgkins Disease T-Cell   Heart failure Father    COPD Father    Heart disease Brother 15       MI and Heart Disease before age 69   Heart attack Brother    Lung cancer Maternal Uncle    Cancer Cousin    Prostate cancer Maternal Uncle    Prostate cancer Brother        half brother   Breast cancer Neg Hx     Review of Systems: Constitutional: Denies fevers, chills or abnormal weight loss Eyes: Denies blurriness of vision Ears, nose, mouth, throat, and face: Denies mucositis or sore throat Respiratory: Denies cough, dyspnea or wheezes Cardiovascular: Denies palpitation, chest discomfort or lower extremity swelling Gastrointestinal:  +dyshpagia, odynophagia GU: Denies dysuria or incontinence Skin: Denies abnormal skin rashes Neurological: Per HPI Musculoskeletal: Denies joint pain, back or neck discomfort. No decrease in ROM Behavioral/Psych: Denies anxiety, disturbance in thought content, and mood instability   Physical Exam: Vitals:   09/12/18 1123  BP: 111/71  Pulse: 88  Resp: 18  Temp: 99.1 F (37.3 C)  SpO2: 100%   KPS: 70. General: Alert, cooperative, pleasant, in no acute distress. Cushingoid. Head: Craniotomy scar noted, dry and intact. EENT: No conjunctival injection or scleral icterus. Oral mucosa moist Lungs: Resp effort normal Cardiac: Regular rate and rhythm Abdomen: Soft, non-distended abdomen Skin: Skin breakdown noted on arms and abdomen Extremities: No clubbing or edema  Neurologic Exam: Mental Status: Awake, alert, attentive to examiner. Oriented to self and environment. Language is fluent with intact comprehension.  Cranial Nerves: Visual acuity is grossly normal. Visual fields are full. Extra-ocular movements intact. No ptosis. Face is symmetric,  tongue midline. Motor: Tone and bulk are normal. 4/5 symmetric weakness in hip girdle. Reflexes are symmetric, no pathologic reflexes present. Subtle impairment in fine coordination only. Sensory: Intact to light touch and temperature Gait: Non-independent  Labs: I have reviewed the data as listed    Component Value Date/Time   NA 135 09/04/2018 0824   K 3.5 09/04/2018 0824   CL 97 (L) 09/04/2018 0824   CO2 26 09/04/2018 0824   GLUCOSE 86 09/04/2018 0824   BUN 18 09/04/2018 0824   CREATININE 0.85 09/04/2018 0824   CALCIUM 9.1 09/04/2018 0824   PROT 6.4 (L) 09/04/2018 0824   ALBUMIN 3.3 (L) 09/04/2018 0824   AST 18 09/04/2018 0824   ALT 25 09/04/2018 0824   ALKPHOS 163 (H) 09/04/2018 0824   BILITOT 0.6 09/04/2018 0824   GFRNONAA >60 09/04/2018 0824   GFRAA >60 09/04/2018 0824   Lab Results  Component Value Date   WBC 3.2 (L) 09/04/2018   NEUTROABS 2.5 09/04/2018   HGB 10.7 (L) 09/04/2018   HCT 33.3 (L) 09/04/2018   MCV 103.4 (H) 09/04/2018   PLT 122 (L) 09/04/2018    Imaging:  Ct Angio Chest Pe W And/or Wo Contrast  Result Date: 08/23/2018 CLINICAL DATA:  Hypoxia and shortness of breath. Personal history of esophageal cancer treated with radiation. Personal history of breast cancer. Personal history of brain metastases treated with radiation. Initial encounter. EXAM: CT ANGIOGRAPHY CHEST WITH CONTRAST TECHNIQUE: Multidetector CT imaging of the chest was performed using the standard protocol during bolus administration of intravenous contrast. Multiplanar CT image reconstructions and MIPs were obtained to evaluate the vascular anatomy. CONTRAST:  158m OMNIPAQUE IOHEXOL 350 MG/ML SOLN COMPARISON:  One-view chest x-ray 08/23/2018 CT of the chest 01/26/2018. PET scan 02/13/2018 FINDINGS: Cardiovascular:  Heart size upper limits of normal. Atherosclerotic changes are noted at the aortic arch. Calcifications are present at the origins the great vessels without focal stenosis. There is  no aneurysm. Pulmonary artery opacification is excellent. No focal filling defects are present to suggest pulmonary emboli. Mediastinum/Nodes: No significant mediastinal, hilar or axillary adenopathy is present. There is moderate distal esophageal wall thickening circumferentially. Bulky mass lesion previously noted is no longer discretely evident. Esophageal thickening extends into the stomach. Lungs/Pleura: The right middle lobe pulmonary nodule measuring 12 mm is stable. Linear opacities at the right base likely reflect atelectasis. Infection is considered less likely. The left lung is clear. No significant effusions are present. There is no pneumothorax. Azygos fissure is noted. Upper Abdomen: Visualized upper abdomen is within normal limits. Musculoskeletal: T3, T8, T10, and T11 compression fractures are stable. There is no progression of the fractures or retropulsed bone. No focal lytic or blastic lesions are present. Review of the MIP images confirms the above findings. IMPRESSION: 1. No pulmonary embolus. 2. Linear densities at the right base likely reflect atelectasis. Infection is considered less likely. 3. No other acute or focal abnormality to explain shortness of breath or hypoxia. 4. Residual circumferential distal esophageal thickening is less prominent than on the prior exam. No discrete mass lesion is evident. 5. Stable remote thoracic compression fractures. Electronically Signed   By: San Morelle M.D.   On: 08/23/2018 05:30   Mr Jeri Cos EH Contrast  Result Date: 09/12/2018 CLINICAL DATA:  Metastatic disease to brain. SRS treatment to brain lesion. EXAM: MRI HEAD WITHOUT AND WITH CONTRAST TECHNIQUE: Multiplanar, multiecho pulse sequences of the brain and surrounding structures were obtained without and with intravenous contrast. CONTRAST:  54m MULTIHANCE GADOBENATE DIMEGLUMINE 529 MG/ML IV SOLN COMPARISON:  MRI head 04/28/2018 FINDINGS: Brain: Enhancing right cerebellar tumor is  essentially unchanged measuring 3.0 x 2.7 x 2.7 cm. Susceptibility within the tumor as well as hyperdensity on CT may reflect prior hemorrhage or calcification. Surrounding edema in the right cerebellum improved. Flattening of the posterior fourth ventricle on the right unchanged. No hydrocephalus. No other enhancing lesions identified in the brain. Mild atrophy. No acute infarct. Chronic white matter changes stable consistent with microvascular ischemia. Vascular: Normal arterial flow voids Skull and upper cervical spine: Negative Sinuses/Orbits: Bilateral cataract surgery.  Paranasal sinuses clear Other: None IMPRESSION: Right cerebellar metastatic deposit unchanged in size. Improvement in surrounding edema. No new enhancing lesions identified. Negative for hydrocephalus. Electronically Signed   By: CFranchot GalloM.D.   On: 09/12/2018 12:51   Nm Pet Image Restag (ps) Skull Base To Thigh  Result Date: 08/30/2018 CLINICAL DATA:  Subsequent treatment strategy for esophageal carcinoma. Stage IV disease with brain metastasis. EXAM: NUCLEAR MEDICINE PET SKULL BASE TO THIGH TECHNIQUE: 9.4 mCi F-18 FDG was injected intravenously. Full-ring PET imaging was performed from the skull base to thigh after the radiotracer. CT data was obtained and used for attenuation correction and anatomic localization. Fasting blood glucose: 72 mg/dl COMPARISON:  PET 02/13/2018, CT chest 12/06/2003 FINDINGS: Mediastinal blood pool activity: SUV max 2.9 Liver activity: SUV max NA NECK: Photopenia associated with the insert billing mass. No hypermetabolic lymph nodes in the neck. Incidental CT findings: none CHEST: Decrease in size and metabolic activity of distal esophageal mass. Most intense activity in the esophagus is SUV max equal 14 decreased from SUV max equal 22.4. Additionally the asymmetric thickening within the esophagus has decreased now only mild mural thickening to 6 mm compared to 17  mm on prior. The length of hypermetabolic  esophagus is also decreased with intense activity in the lower esophagus leading up to the GE junction beneath the carina and only mild activity in the esophagus above the carina. Previously intense metabolic activity above and below the carina. No hypermetabolic mediastinal nodes. Mild metabolic activity associated with a RIGHT middle lobe pulmonary nodule measuring 10 mm SUV max equal 3.2. The size of the nodule is unchanged. The metabolic activity is similar. No new pulmonary nodules. This node is increased slightly in size from 2005. Incidental CT findings: Port in the anterior chest wall with tip in distal SVC. Dense calcification in the medial LEFT breast. ABDOMEN/PELVIS: No abnormal hypermetabolic activity within the liver, pancreas, adrenal glands, or spleen. No hypermetabolic lymph nodes in the abdomen or pelvis. No hypermetabolic lymph nodes in the gastrohepatic ligament. No liver metastasis. Incidental CT findings: Atherosclerotic calcification of the aorta. SKELETON: No focal hypermetabolic activity to suggest skeletal metastasis. Incidental CT findings: none IMPRESSION: 1. Decrease in size and metabolic activity of distal esophageal primary carcinoma. 2. No evidence metastatic adenopathy in the chest or abdomen. 3. Lesion in the cerebellum again noted. 4. Stable pulmonary nodule RIGHT middle lobe is not related to esophageal carcinoma as present in 2005 (smaller then). Favor indolent neoplasm such as carcinoid or hamartoma. Electronically Signed   By: Suzy Bouchard M.D.   On: 08/30/2018 09:50   Dg Chest Portable 1 View  Result Date: 08/23/2018 CLINICAL DATA:  Shortness of breath EXAM: PORTABLE CHEST 1 VIEW COMPARISON:  Chest x-ray dated 04/09/2018. FINDINGS: There is a well-positioned right-sided Port-A-Cath. The heart size is enlarged. There are bibasilar airspace opacities. There is an azygos lobe. There is no pneumothorax. There is some mild blunting of the costophrenic angles bilaterally.  There is hyperexpansion of the lungs. Aortic calcifications are noted. There is a stable calcification projecting over the left lower lobe, likely related to the patient's breast tissue. Previously noted nodular opacity in the right middle lobe is not well appreciated. IMPRESSION: 1. Well-positioned right-sided Port-A-Cath. 2. Mild cardiomegaly. 3. New right lower lobe airspace opacity which may represent atelectasis or infiltrate. Electronically Signed   By: Constance Holster M.D.   On: 08/23/2018 02:25    [facility] Clinician Interpretation: I have personally reviewed the radiological images as listed.  My interpretation, in the context of the patient's clinical presentation, is stable disease   Assessment/Plan 1. Metastasis to brain [facility])  Ms. Welby has clinical decline today which is consistent with adverse effects from dexamethasone.  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  This is further supported by her cushingoid appearance, dependent edema, and peripheral skin breakdown.      Decadron may be safely tapered because of good stability of neoplasm and surrounding T2 signal on today's MRI brain.  Taper should be performed as follows: -26m daily for one week, then -212mdaily for one week, then -45m89mailiy for one week, then stop.  It is important that she remain active and continue her PT exercises during this time to regain strength in her hip girdle.    We appreciate the opportunity to participate in the care of BerTERESINA BUGAJShe should follow up with us [place] 4 weeks after completing the taper, or sooner if clinically indicated.  Next MRI can be performed in 3 months.  All questions were answered. The patient knows to call the clinic with any problems, questions or concerns. No barriers to  learning were detected.  The total time spent in the encounter was 40 minutes and more than 50% was on counseling and review of test  results   Ventura Sellers, MD Medical Director of Neuro-Oncology [facility] [place] 09/12/18 4:12 PM

## 2018-09-14 ENCOUNTER — Other Ambulatory Visit: Payer: Self-pay | Admitting: Hematology

## 2018-09-21 ENCOUNTER — Other Ambulatory Visit: Payer: Self-pay | Admitting: Nurse Practitioner

## 2018-09-21 ENCOUNTER — Telehealth: Payer: Self-pay

## 2018-09-21 DIAGNOSIS — M545 Low back pain, unspecified: Secondary | ICD-10-CM

## 2018-09-21 MED ORDER — HYDROCODONE-ACETAMINOPHEN 7.5-325 MG PO TABS: 1.0000 | ORAL_TABLET | Freq: Four times a day (QID) | ORAL | 0 refills | Status: DC | PRN

## 2018-09-21 NOTE — Telephone Encounter (Signed)
Patient calls for refill on Hydrocodone, last filled on 6/29 #45 patient taking on every 4 to 6 hours as instructed by [place].  Send into [employer] in [place] on file.  [phone]

## 2018-09-25 ENCOUNTER — Telehealth: Payer: Self-pay

## 2018-09-25 NOTE — Telephone Encounter (Signed)
Spoke with Kristin Williams per Dr. Burr Medico offered appointment in am 10:00 for lab and see Dr. Burr Medico at 10:30.  She agreed with the plan.

## 2018-09-25 NOTE — Progress Notes (Signed)
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   01/30/2018 Initial Diagnosis   Metastasis to brain [facility])   01/31/2018 Imaging   MRI Brain W WO Contrast 01/31/18  IMPRESSION: 3 Tesla study does not disclose any additional lesions. Solitary metastasis in the right superior cerebellum measuring 2.9 x 2.6 x 3.0 cm with mild surrounding edema. 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   02/03/2018 - 02/08/2018 Radiation Therapy   SBRT with Dr. Isidore Moos on 02/03/18, 02/06/18, 02/08/18   06/12/2018 - 09/04/2018 Chemotherapy   FOLFOX with leucovorin q2weeks    09/12/2018 Imaging   MRI Brain  IMPRESSION: Right cerebellar metastatic deposit unchanged in size. Improvement in surrounding edema. No new enhancing lesions identified.   Negative for hydrocephalus.   Esophageal cancer, stage IV ([place])  01/08/2018 Imaging   CT AP W Constrast 01/08/18  IMPRESSION: 1. Nonobstructed, nondistended bowel. No acute inflammatory process. 2. Moderate-sized hiatal hernia noted. 3. Low-density 1.7 cm left adrenal nodule likely to represent a small adenoma. Lower pole left renal cyst measuring 1.8 cm. 4. Moderate aortic atherosclerosis.  5. Lumbar spondylosis and facet arthropathy.   01/27/2018 Imaging   CT Chest W Contrast 01/27/18  IMPRESSION: 1. Large mass involving the distal half of the esophagus spanning approximately 11.7 cm is identified compatible with primary esophageal neoplasm. 2. Small posterior mediastinal and high right paratracheal lymph nodes are noted which may represent foci of metastatic adenopathy. 3. The small pulmonary nodules in the right lung are unchanged from 01/15/2016 and are favored to represent a benign process. 4. Aortic Atherosclerosis (ICD10-I70.0) and Emphysema (ICD10-J43.9). 5. Coronary artery atherosclerotic calcifications.   01/30/2018 Initial Diagnosis   Esophageal cancer, stage IV ([place])   02/03/2018 - 02/08/2018 Radiation Therapy   SBRT to oligo brain metastasis, in 3 fractions, under Dr. Isidore Moos    02/16/2018 PET scan   PET 02/16/18 IMPRESSION: 1. A 15.7 cm in length esophageal mass extends from the aortic arch level down to the distal esophagus, maximum SUV 22.4, compatible with malignancy. There is a small right lower neck level IV lymph node with lower than mediastinal blood pool activity, and a right upper paratracheal lymph node with just above mediastinal blood pool activity. 2. Faint speckled heterogeneity in the liver, for example in the lateral segment left hepatic lobe, is likely incidental/benign given the lack of correlate of CT finding. It might be prudent to obtain an MRI just to make sure that there is not a small early metastatic lesion in this vicinity. 3. Known right cerebellar vermian metastatic lesion, with expected hypo activity compared to the surrounding brain activity. 4. The right middle lobe pulmonary nodule measures 1.1 by 1.0 cm and is mildly hypermetabolic with maximum SUV 2.6. This lesion measured 6 by 5 mm back on 12/06/2003  and 1.2 by 1.1 cm on 10/15/2015. I am uncertain what to make of the very slow growth and mildly accentuated metabolic  activity. This could be a chronic granulomatous process. Low-grade adenocarcinoma of the lung seems unlikely to be so indolent as to only mildly increase over a 14 year period, but is presumably a differential diagnostic consideration. Surveillance is suggested. 5. Other imaging findings of potential clinical significance: Aortic Atherosclerosis (ICD10-I70.0). Descending and sigmoid colon diverticulosis. Gas in the urinary bladder, most common cause would be recent catheterization. Minimal chronic left ethmoid sinusitis.   02/27/2018 - 04/09/2018 Chemotherapy   Concurrent chemoRT with weekly carboplatin and Taxol 02/27/2018-04/03/18   02/27/2018 - 04/07/2018 Radiation Therapy   Concurrent ChemoRT 02/27/18-04/07/18   06/12/2018 - 09/04/2018 Chemotherapy   FOLFOX with leucovorin q2weeks    08/31/2018 PET scan      PET 08/30/18 IMPRESSION: 1. Decrease in size and metabolic activity of distal esophageal primary carcinoma. 2. No evidence metastatic adenopathy in the chest or abdomen. 3. Lesion in the cerebellum again noted. 4. Stable pulmonary nodule RIGHT middle lobe is not related to esophageal carcinoma as present in 2005 (smaller then). Favor indolent neoplasm such as carcinoid or hamartoma.   09/12/2018 Imaging   MRI Brain  IMPRESSION: Right cerebellar metastatic deposit unchanged in size. Improvement in surrounding edema. No new enhancing lesions identified.   Negative for hydrocephalus.   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 Ultrasound-guided biopsy is recommended.   01/10/2018 Initial Biopsy   Diagnsis from Breast Biopsy 01/10/18  Breast, right, needle core biopsy, 6 o'clock - INVASIVE DUCTAL  CARCINOMA WITH CALCIFICATIONS, SEE COMMENT. - DUCTAL CARCINOMA IN SITU. 1 of 2 FINAL for IRINE, HEMINGER [phone]) Microscopic Comment The carcinoma appears grade 2. Prognostic markers will be ordered. Dr. [name] has reviewed the case. The case was called to [place] on 01/11/2018.   01/10/2018 Receptors her2   The tumor cells are NEGATIVE for Her2 (1+). Estrogen Receptor: 100%, POSITIVE, STRONG STAINING INTENSITY Progesterone Receptor: 100%, POSITIVE, STRONG STAINING INTENSITY Proliferation Marker Ki67: 15%   01/10/2018 Cancer Staging   Staging form: Breast, AJCC 8th Edition - Clinical stage from 01/10/2018: Stage IA (cT1b, cN0, cM0, G2, ER+, PR+, HER2-) - Signed by [name], MD on 02/03/2018   01/30/2018 Initial Diagnosis   Breast cancer ([place])   05/07/2018 -  Anti-estrogen oral therapy   Anastrozole 26m daily starting 05/07/2018      CURRENT THERAPY:  Anastrozole 152mdaily started2/23/2020. Due to rash, switched to Letrozole 2.39m50mn 06/2718.   INTERVAL HISTORY:  Kristin Williams here for worsening Leg swelling and bruising.  She presents to the clinic alone. She called her daughter Kristin Williams be included in visit. She notes these bruising of her legs, arms and mildly on back. She still has skin rash on breast and lower abdomen. For her swelling she has been taking Lasix and compression socks.  She notes buttocks has 2 dimes size bed sores. She has been using DuoDerm. The skin was broken but now healing. She tries to get up every hour to walk. She has been drinking water and eating but weight still trending down. She notes her swallowing is now intermittent, not everyday. She takes small bites  and chews very well. She has been nauseous for 2 days but this is controlled with Reglan. She plans to have a lift chair to help her sleep in.  She notes she will take her last taper dose of dexa at 44m next week for 1 week.  She notes lately she has been coughing  more with green phlegm. She has had 1-2 times of specs of blood in phlegm. She notes she is frustrated with her progress and symptoms when she feels she should be further along.    REVIEW OF SYSTEMS:   Constitutional: Denies fevers, chills or abnormal weight loss Eyes: Denies blurriness of vision Ears, nose, mouth, throat, and face: Denies mucositis or sore throat (+) intermittent dysphagia  Respiratory: Denies dyspnea or wheezes (+) progressive cough with yellow-green phlegm.  Cardiovascular: Denies palpitation, chest discomfort worsening lower extremity swelling Gastrointestinal:  Denies heartburn or change in bowel habits (+) Nausea, controlled  Skin: (+) More skin rash of breasts and lower abdomen  (+) 2 dime sized bed sores on buttocks.  Lymphatics: Denies new lymphadenopathy (+) significant bruising of body  Neurological:Denies numbness, tingling or new weaknesses Behavioral/Psych: Mood is stable, no new changes  All other systems were reviewed with the patient and are negative.  MEDICAL HISTORY:  Past Medical History:  Diagnosis Date  . Arthritis   . Cancer (H[facility])    Cervical  . Cancer ([place]    Breast  . Cancer ([facility]    Vaginal  . Complication of anesthesia   . COPD (chronic obstructive pulmonary disease) ([place]   . Headache(784.0)   . History of radiation therapy 02/28/19- 04/07/18   Esophagus, 1.8 Gy in 28 fractions for a total dose of 50.4 Gy.   .Marland KitchenHistory of radiation therapy 02/03/18- 02/08/18   Brain Rt Sup Cerebellum// 6 FFF photons.   . Hyperlipidemia   . Leg pain   . Peripheral vascular disease ([place]   . Personal history of chemotherapy   . Personal history of radiation therapy   . PONV (postoperative nausea and vomiting)     SURGICAL HISTORY: Past Surgical History:  Procedure Laterality Date  . ABDOMINAL HYSTERECTOMY  1975  . BREAST LUMPECTOMY  2005  . GANGLION CYST EXCISION  1977  . ILIAC ARTERY STENT  04/20/2004   CSD right iliac occlusive disease  . IR  IMAGING GUIDED PORT INSERTION  02/22/2018    I have reviewed the social history and family history with the patient and they are unchanged from previous note.  ALLERGIES:  is allergic to flonase [fluticasone propionate].  MEDICATIONS:  Current Outpatient Medications  Medication Sig Dispense Refill  . albuterol (PROVENTIL HFA;VENTOLIN HFA) 108 (90 Base) MCG/ACT inhaler Inhale 2 puffs into the lungs every 6 (six) hours as needed for wheezing or shortness of breath.    .Marland Kitchenatorvastatin (LIPITOR) 20 MG tablet Take 20 mg by mouth daily.    . budesonide-formoterol (SYMBICORT) 160-4.5 MCG/ACT inhaler Inhale 1 puff into the lungs 2 (two) times daily.    . cyclobenzaprine (FLEXERIL) 5 MG tablet Take 1 tablet (5 mg total) by mouth 3 (three) times daily as needed for muscle spasms. 270 tablet 0  . dexamethasone (DECADRON) 1 MG tablet Take 354mdaily in the AM 60 tablet 1  . furosemide (LASIX) 20 MG tablet Take 2 tablets (40 mg total) by mouth daily. Take additional 2010mm as needed 90 tablet 0  . HYDROcodone-acetaminophen (NORCO) 7.5-325 MG tablet Take 1 tablet by mouth every 6 (six) hours as  needed for moderate pain. 45 tablet 0  . letrozole (FEMARA) 2.5 MG tablet Take 1 tablet (2.5 mg total) by mouth daily. 30 tablet 3  . lidocaine (XYLOCAINE) 2 % solution MIX 1 PART LIDOCAINE, 1 PART H20 SWALLOW 10ML OF DILUTED MIXTURE, 20 MINUTES BEFORE MEALS AND AT BEDTIME UP TO 3 TIMES DAILY. 300 mL 0  . lidocaine-prilocaine (EMLA) cream Apply 1 application topically as needed. (Patient taking differently: Apply 1 application topically as needed (port). ) 30 g 1  . LORazepam (ATIVAN) 1 MG tablet Take 0.5 tablets (0.5 mg total) by mouth 3 (three) times daily as needed for anxiety or sleep. 45 tablet 0  . metoCLOPramide (REGLAN) 10 MG tablet Take 1 tablet (10 mg total) by mouth every 6 (six) hours as needed. for nausea 60 tablet 1  . omeprazole (PRILOSEC) 40 MG capsule Take 40 mg by mouth daily.    . potassium chloride  SA (K-DUR) 20 MEQ tablet Take 1 tablet (20 mEq total) by mouth daily. 30 tablet 0  . predniSONE (DELTASONE) 50 MG tablet . 4 tablet 0  . sucralfate (CARAFATE) 1 g tablet DISSOLVE ONE TABLET IN 10 MLS OF WATER AND SWALLOW UP TO FOUR TIMES DAILY TO SOOTHE THROAT (Patient taking differently: Take 1 g by mouth See admin instructions. DISSOLVE ONE TABLET IN 10 MLS OF WATER AND SWALLOW UP TO FOUR TIMES DAILY TO SOOTHE THROAT) 40 tablet 2  . warfarin (COUMADIN) 5 MG tablet Take 0.5-1 tablets (2.5-5 mg total) by mouth See admin instructions. 2.5 MG on Monday, Wednesday and Saturday 5 Mg on Sunday, Tuesday, Thursday and Friday 30 tablet 2  . magic mouthwash SOLN Take 5 mLs by mouth 4 (four) times daily. (Patient not taking: Reported on 09/26/2018) 60 mL 0  . nystatin (MYCOSTATIN) 100000 UNIT/ML suspension TAKE 5 ML BY MOUTH  4 TIMES DAILY (Patient not taking: Reported on 09/26/2018) 60 mL 0  . ondansetron (ZOFRAN) 8 MG tablet Take 1 tablet (8 mg total) by mouth every 8 (eight) hours as needed for nausea or vomiting. (Patient not taking: Reported on 09/12/2018) 20 tablet 0  . traZODone (DESYREL) 50 MG tablet Take 1 tablet (50 mg total) by mouth at bedtime. (Patient not taking: Reported on 09/12/2018) 30 tablet 1   No current facility-administered medications for this visit.     PHYSICAL EXAMINATION: ECOG PERFORMANCE STATUS: 3 - Symptomatic, >50% confined to bed  Vitals:   09/26/18 1035  BP: 131/78  Pulse: 98  Resp: 17  Temp: 98.7 F (37.1 C)  SpO2: 98%   Filed Weights   09/26/18 1035  Weight: 183 lb 3.2 oz (83.1 kg)    GENERAL:alert, no distress and comfortable SKIN: skin color, texture, turgor are normal (+) (+) Diffuse ecchymosis on bilateral lower extremities, and arms, a few on back. (+) Petechia in the abdomen and thigh. EYES: normal, Conjunctiva are pink and non-injected, sclera clear  NECK: supple, thyroid normal size, non-tender, without nodularity LYMPH:  no palpable lymphadenopathy in  the cervical, axillary  LUNGS: clear to auscultation and percussion with normal breathing effort HEART: regular rate & rhythm and no murmurs  (+) B/l  lower extremity pitting edema ABDOMEN:abdomen soft, non-tender and normal bowel sounds Musculoskeletal:no cyanosis of digits and no clubbing  NEURO: alert & oriented x 3 with fluent speech, no focal motor/sensory deficits  LABORATORY DATA:  I have reviewed the data as listed CBC Latest Ref Rng & Units 09/26/2018 09/04/2018 08/24/2018  WBC 4.0 - 10.5 K/uL 9.2 3.2(L)  6.0  Hemoglobin 12.0 - 15.0 g/dL 9.4(L) 10.7(L) 10.8(L)  Hematocrit 36.0 - 46.0 % 29.4(L) 33.3(L) 33.2(L)  Platelets 150 - 400 K/uL 128(L) 122(L) 147(L)     CMP Latest Ref Rng & Units 09/26/2018 09/04/2018 08/24/2018  Glucose 70 - 99 mg/dL 107(H) 86 106(H)  BUN 8 - 23 mg/dL '19 18 18  ' Creatinine 0.44 - 1.00 mg/dL 0.79 0.85 0.76  Sodium 135 - 145 mmol/L 136 135 132(L)  Potassium 3.5 - 5.1 mmol/L 3.6 3.5 3.6  Chloride 98 - 111 mmol/L 96(L) 97(L) 96(L)  CO2 22 - 32 mmol/L '28 26 26  ' Calcium 8.9 - 10.3 mg/dL 9.0 9.1 7.7(L)  Total Protein 6.5 - 8.1 g/dL 6.3(L) 6.4(L) -  Total Bilirubin 0.3 - 1.2 mg/dL 0.5 0.6 -  Alkaline Phos 38 - 126 U/L 220(H) 163(H) -  AST 15 - 41 U/L 22 18 -  ALT 0 - 44 U/L 23 25 -      RADIOGRAPHIC STUDIES: I have personally reviewed the radiological images as listed and agreed with the findings in the report. No results found.   ASSESSMENT & PLAN:  Kristin Williams is a 74 y.o. female with   1. Diffuse skin bruising and worsening LE swelling.  -On exam today she has significant bruising on left upper arm, b/l lower legs and upper right arm. She also has more pitting edema of her b/l legs which are cold to touch.  -I discussed her bruising is likely related to her Dexa use and super therapeutic Coumadin levels. She is currently on last taper dose of dexa 23m which she will complete after a week.  -INR 4.1today (09/26/18). Will hold Coumadin for 2 days then  starting 09/29/18 she will decrease Coumadin to 2.586mevery day except 13m39mn Sundays and Wednesdays.  -Labs reviewed, CBC and CMP WNL except Hg 9.4, PLT 128K, ANC 8, lymphocytes 0.3, BG 107, protein 6.3, albumin 2.8, alk phos 220.  -She is currently on 39m11msix with potassium. I recommend her to continue and add 20mg59mthe PM as needed, continue compression socks, and feet elevation and warm.  -She also has 2 dime sized bed sores on buttocks which are healing. She will continue monitoring  -she still has significant balance issue. I also encouraged her to do non-weight bearing exercise of legs and arms to help her strength. She will continue to walk every hour.    2.EsophagealAdenocarcinoma,with oligo met incerebellum, stage IV, HER2(-), MSS, and PD-L1(-) -She was diagnosed with esophogeal cancerin 01/2018,with oligometastasisto the brain. Given her stage IV disease her cancer is not curable but still treatable. Goal of care it to control her disease.  -She completedSBRTto her brain meton11/27/2019and completed chemoRTto primary tumoron 04/07/18.She recovered well. -Her molecular testing on the tumor showed MSI stable disease, PD-L1 negative, she is not a candidate for immunotherapy. Her2was also negative,not a candidate for EGFR inhibitor. -She completed consolidation chemotherapytreatment with FOLFOX q2weeksfor 3 months.  -We discussed PET scan from 08/30/18 which shows decrease in size and metabolic activity of distal esophageal primary carcinoma and no evidence metastatic adenopathy in the chest or abdomen. She was pleased with the result  -We discussed that her esophageal cancer is not cured.  Due to her recent back pain, poor performance status, I recommend her take a chemo break for today's treatment.  She understands cancer start growing again, she may restart chemotherapy.  We also talked about option of esophageal stent if she develops worsening dysphagia. -repeat CT  scan in September  2. mid/low back pain -MRI Thoracic and lumbar spine showed compression fracture atT3, T8, and T11 appear benign. -She was seen by neurosurgeon Dr. Sherwood Gambler who suspect her pain is not mainly from her compression fractures -her PET scan is negative for bone mets, I think her back pain is likely related to her compression fractures -Back pain manageable with Hydrocodone q6hours and flexeril, overall improved, will continue monitoring    3. Dysphagia, nausea -secondary toher esophageal cancer and chemoradiation. -she is recovering well overall, odynophagia has resolved -She will continue Reglan,ativanandZofranas needed.  -She still has dysphasia, I encouraged her to continue soft diet, avoiding bread and meat, and chew her food very well before swallowing, continue nutritional supplement  -Her dysphagia is intermittent now, continues to improve. She will continue Reglan as needed for intermittent nausea.   4. RightBreastInvasive Ductal Carcinoma,cT1bN0M0 stage IA,Grade II, ER/PR Positive, HER 2 negative -Diagnosed through screening mammogram, same time as her esophageal cancer in October 2019.  -Given her early stage breast cancer, I previously discussedher breast cancer treatment is less urgent than esophogeal cancertreatment. -Given her EP/PR breast cancer and post-menopausal status,she startedantiestrogen therapy with anastrozoleon 05/07/18, and tolerating wellexcept recent tiny bruising of her breasts and abdomen. Will monitor. -Her breasts surgery has been held due to her metastatic esophageal cancer. -Shepreviouslydeveloped mild skin rash on her chest, arms, and abdomen after she started anastrozole.  -I switched her to letrozole on 07/10/18  -breast surgery has been on hold due to her metastatic esophageal cancer -She continues to have skin rash of breasts and abdomen with mild progression, will monitor.   5.Oligo brain mets, ataxia,  Insomnia -This is likely from her primaryesophogealcancer.  -She does have balance issues and ambulates with assistance.  -She is s/p SBRT. Hermild slurred speech, right sided weaknessand balance stabilityhas muchimproved -Will monitor with brain scan every 3 months. -Dexa exacerbated her insomnia even with ativan to 1.5 tabs. Ipreviouslycalled in Trazodone 13m nightly as needed (06/12/18) -Herslurred speech continues to progress.She continues to exercise and gaining strength of right side. She continues to practice walking with cane although still unsteady, and with walker. Shehas progressing uncontrolled function of her right hand. Her memory is improving.  -Dr. VMickeal Skinnerhas her on tapering dose Dexa from 4364mand now on 64m74mor 1 week then she will be done in 08/2018.   -MRI brain from 09/12/18 shows Right cerebellar metastatic deposit is stable. Improvement of surrounding edema. No new enhancing lesions identified. Negative for hydrocephalus.   6. H/o Left breast cancer, ER/PR negative, treated by Dr. MagJana Hakimth lumpectomy, adj, chemo and radiation. 7.Acute deep vein thrombosis (DVT) of popliteal vein of left lower extremity, diagnosed on 02/28/2018  -She was found to have left lower extremity edema on his first day of chemoradiation, Doppler showed DVT -She iscurrentlyon coumadin 5mg67milyexcept 2.5mg 86monday,Wednesday, Saturday.INR 4.1today (09/26/18). Will decrease Coumadin to 2.5mg e364my day except 5mg on77mndays and Wednesdays.  -Will repeat labs in 2 weeks and then monthly.   8. Residual Neuropathy, G1-2  10.COPD with chronic cough -Shehas chronic cough -She presented to ED for COPD excerebration on 04/12/18. She has recovered and her breathing is improved. -On symbicort inhaler.  -She notes her cough has progressed with yellow-green phlegm. The aggressive coughing has been effecting her back pain as she tries to cough something up.  -I recommend Mucinex to  help her release phlegm.  -If she develops fever or worsening cough, I will call in antibiotics.  11. Depressed Mood    PLAN: -I refilled Lasix today and increase to 368m in the AM and 261min the PM as needed  -Decrease Coumadin to 2.5 mg daily except 68m568mn Sundays and Wednesdays.  -Labs on 7/28 -Continue Letrozole daily -lab, flush and f/u on 11/06/18    No problem-specific Assessment & Plan notes found for this encounter.   No orders of the defined types were placed in this encounter.  All questions were answered. The patient knows to call the clinic with any problems, questions or concerns. No barriers to learning was detected. I spent 25 minutes counseling the patient face to face. The total time spent in the appointment was 30 minutes and more than 50% was on counseling and review of test results     YanTruitt MerleD 09/26/2018   I, AmoJoslyn Devonm acting as scribe for YanTruitt MerleD.   I have reviewed the above documentation for accuracy and completeness, and I agree with the above.

## 2018-09-25 NOTE — Telephone Encounter (Signed)
Patient's daughter Kristin Williams calls with regards to her mother's significant edema in lower extremities at least 4+ pitting, nagging non productive cough, no fever, Dr. Mickeal Skinner is in the process of weaning her off steroids, she is on 2 mg now and will start 1 mg on Wednesday.  Other concern is bruising in different areas all over her body.   [phone]

## 2018-09-26 ENCOUNTER — Inpatient Hospital Stay (HOSPITAL_BASED_OUTPATIENT_CLINIC_OR_DEPARTMENT_OTHER): Payer: Medicare Other | Admitting: Hematology

## 2018-09-26 ENCOUNTER — Other Ambulatory Visit: Payer: Self-pay

## 2018-09-26 ENCOUNTER — Inpatient Hospital Stay: Payer: Medicare Other | Attending: Hematology

## 2018-09-26 ENCOUNTER — Encounter: Payer: Self-pay | Admitting: Hematology

## 2018-09-26 VITALS — BP 131/78 | HR 98 | Temp 98.7°F | Resp 17 | Ht 60.0 in | Wt 183.2 lb

## 2018-09-26 DIAGNOSIS — R110 Nausea: Secondary | ICD-10-CM | POA: Insufficient documentation

## 2018-09-26 DIAGNOSIS — G720 Drug-induced myopathy: Secondary | ICD-10-CM | POA: Insufficient documentation

## 2018-09-26 DIAGNOSIS — R233 Spontaneous ecchymoses: Secondary | ICD-10-CM | POA: Insufficient documentation

## 2018-09-26 DIAGNOSIS — C50911 Malignant neoplasm of unspecified site of right female breast: Secondary | ICD-10-CM

## 2018-09-26 DIAGNOSIS — I824Z2 Acute embolism and thrombosis of unspecified deep veins of left distal lower extremity: Secondary | ICD-10-CM

## 2018-09-26 DIAGNOSIS — I82432 Acute embolism and thrombosis of left popliteal vein: Secondary | ICD-10-CM

## 2018-09-26 DIAGNOSIS — J449 Chronic obstructive pulmonary disease, unspecified: Secondary | ICD-10-CM

## 2018-09-26 DIAGNOSIS — L89309 Pressure ulcer of unspecified buttock, unspecified stage: Secondary | ICD-10-CM | POA: Insufficient documentation

## 2018-09-26 DIAGNOSIS — C7931 Secondary malignant neoplasm of brain: Secondary | ICD-10-CM

## 2018-09-26 DIAGNOSIS — F329 Major depressive disorder, single episode, unspecified: Secondary | ICD-10-CM | POA: Diagnosis not present

## 2018-09-26 DIAGNOSIS — R21 Rash and other nonspecific skin eruption: Secondary | ICD-10-CM

## 2018-09-26 DIAGNOSIS — M7989 Other specified soft tissue disorders: Secondary | ICD-10-CM | POA: Diagnosis not present

## 2018-09-26 DIAGNOSIS — C159 Malignant neoplasm of esophagus, unspecified: Secondary | ICD-10-CM | POA: Diagnosis not present

## 2018-09-26 DIAGNOSIS — R600 Localized edema: Secondary | ICD-10-CM

## 2018-09-26 DIAGNOSIS — Z853 Personal history of malignant neoplasm of breast: Secondary | ICD-10-CM

## 2018-09-26 DIAGNOSIS — I614 Nontraumatic intracerebral hemorrhage in cerebellum: Secondary | ICD-10-CM

## 2018-09-26 LAB — CMP (CANCER CENTER ONLY)
ALT: 23 U/L (ref 0–44)
AST: 22 U/L (ref 15–41)
Albumin: 2.8 g/dL — ABNORMAL LOW (ref 3.5–5.0)
Alkaline Phosphatase: 220 U/L — ABNORMAL HIGH (ref 38–126)
Anion gap: 12 (ref 5–15)
BUN: 19 mg/dL (ref 8–23)
CO2: 28 mmol/L (ref 22–32)
Calcium: 9 mg/dL (ref 8.9–10.3)
Chloride: 96 mmol/L — ABNORMAL LOW (ref 98–111)
Creatinine: 0.79 mg/dL (ref 0.44–1.00)
GFR, Est AFR Am: >60 mL/min (ref >60)
GFR, Estimated: >60 mL/min (ref >60)
Glucose, Bld: 107 mg/dL — ABNORMAL HIGH (ref 70–99)
Potassium: 3.6 mmol/L (ref 3.5–5.1)
Sodium: 136 mmol/L (ref 135–145)
Total Bilirubin: 0.5 mg/dL (ref 0.3–1.2)
Total Protein: 6.3 g/dL — ABNORMAL LOW (ref 6.5–8.1)

## 2018-09-26 LAB — CBC WITH DIFFERENTIAL (CANCER CENTER ONLY)
Abs Immature Granulocytes: 0.31 10*3/uL — ABNORMAL HIGH (ref 0.00–0.07)
Basophils Absolute: 0 10*3/uL (ref 0.0–0.1)
Basophils Relative: 0 %
Eosinophils Absolute: 0 10*3/uL (ref 0.0–0.5)
Eosinophils Relative: 0 %
HCT: 29.4 % — ABNORMAL LOW (ref 36.0–46.0)
Hemoglobin: 9.4 g/dL — ABNORMAL LOW (ref 12.0–15.0)
Immature Granulocytes: 3 %
Lymphocytes Relative: 3 %
Lymphs Abs: 0.3 10*3/uL — ABNORMAL LOW (ref 0.7–4.0)
MCH: 33 pg (ref 26.0–34.0)
MCHC: 32 g/dL (ref 30.0–36.0)
MCV: 103.2 fL — ABNORMAL HIGH (ref 80.0–100.0)
Monocytes Absolute: 0.6 10*3/uL (ref 0.1–1.0)
Monocytes Relative: 6 %
Neutro Abs: 8 10*3/uL — ABNORMAL HIGH (ref 1.7–7.7)
Neutrophils Relative %: 88 %
Platelet Count: 128 10*3/uL — ABNORMAL LOW (ref 150–400)
RBC: 2.85 MIL/uL — ABNORMAL LOW (ref 3.87–5.11)
RDW: 16.8 % — ABNORMAL HIGH (ref 11.5–15.5)
WBC Count: 9.2 10*3/uL (ref 4.0–10.5)
nRBC: 0.9 % — ABNORMAL HIGH (ref 0.0–0.2)

## 2018-09-26 LAB — PROTIME-INR
INR: 4.1 (ref 0.8–1.2)
Prothrombin Time: 39 seconds — ABNORMAL HIGH (ref 11.4–15.2)

## 2018-09-26 MED ORDER — FUROSEMIDE 20 MG PO TABS: 40.0000 mg | ORAL_TABLET | Freq: Every day | ORAL | 0 refills | Status: DC

## 2018-09-26 NOTE — Progress Notes (Unsigned)
Critical INR reported to and read back by RN Ellery Plunk at 11.02am

## 2018-09-27 ENCOUNTER — Telehealth: Payer: Self-pay | Admitting: Hematology

## 2018-09-27 NOTE — Telephone Encounter (Signed)
Scheduled appt per 7/14 los. ° °Spoke with patient and she is aware of her appt date and time. °

## 2018-09-29 ENCOUNTER — Telehealth: Payer: Self-pay

## 2018-09-29 NOTE — Telephone Encounter (Signed)
Patient's daughter Maudie Mercury calls regarding the patient's lower extremity edema now 3+ however now having weeping from legs.  She would like advice on what to do.  [phone]

## 2018-09-29 NOTE — Telephone Encounter (Signed)
Patient's daughter Maudie Mercury) was called. Patient will increase Lasix to 40 mg QAM and 20 mg Q 2 PM daily throughout the weekend and elevate her legs. She will call for an appointment if no better by 10/02/2018. Patient's neighbor who is a nurse came over and cleaned and wrapped her legs. Daughter expressed understanding and agreement with this plan.  Sandi Mealy, MHS, PA-C Physician Assistant

## 2018-10-02 ENCOUNTER — Telehealth: Payer: Self-pay

## 2018-10-02 ENCOUNTER — Other Ambulatory Visit: Payer: Self-pay | Admitting: Nurse Practitioner

## 2018-10-02 DIAGNOSIS — M545 Low back pain, unspecified: Secondary | ICD-10-CM

## 2018-10-02 MED ORDER — HYDROCODONE-ACETAMINOPHEN 7.5-325 MG PO TABS: 1.0000 | ORAL_TABLET | Freq: Four times a day (QID) | ORAL | 0 refills | Status: DC | PRN

## 2018-10-02 NOTE — Telephone Encounter (Signed)
I refilled for her. Thanks, Regan Rakers

## 2018-10-02 NOTE — Telephone Encounter (Signed)
Patient calls for refill on Hydrocodone, still taking one to two every every 6 hours for pain, last filled 09/21/2018 #45.

## 2018-10-06 ENCOUNTER — Telehealth: Payer: Self-pay | Admitting: *Deleted

## 2018-10-06 NOTE — Telephone Encounter (Signed)
Daughter called to say that since tapering off the steroids Kristin Williams has "lost her gait, speech slurred, not feeling good and it take 2 people to bathe her ". "Continues to have 3+ edema in legs, had some weeping in legs, is taking in fluids fine, but her urine output has decreased".  Wanted to make Dr Mickeal Skinner aware of this prior to appt on Tuesday. Daughter is requesting to come with patient to appt. Reminded of current policy regarding appts.

## 2018-10-09 ENCOUNTER — Telehealth: Payer: Self-pay

## 2018-10-09 ENCOUNTER — Other Ambulatory Visit: Payer: Self-pay | Admitting: Hematology

## 2018-10-09 MED ORDER — HYDROCODONE-HOMATROPINE 5-1.5 MG/5ML PO SYRP: 5.0000 mL | ORAL_SOLUTION | Freq: Four times a day (QID) | ORAL | 0 refills | Status: AC | PRN

## 2018-10-09 NOTE — Telephone Encounter (Signed)
Patient's daughter calls for refill on Hycodan that was prescribed by Dr. Burr Medico in December for cough.  Send into pharmacy on file.

## 2018-10-09 NOTE — Telephone Encounter (Signed)
Done, thanks   Catarina Huntley MD  

## 2018-10-10 ENCOUNTER — Inpatient Hospital Stay: Payer: Medicare Other

## 2018-10-10 ENCOUNTER — Other Ambulatory Visit: Payer: Self-pay

## 2018-10-10 ENCOUNTER — Telehealth: Payer: Self-pay | Admitting: *Deleted

## 2018-10-10 ENCOUNTER — Inpatient Hospital Stay (HOSPITAL_BASED_OUTPATIENT_CLINIC_OR_DEPARTMENT_OTHER): Payer: Medicare Other | Admitting: Internal Medicine

## 2018-10-10 VITALS — BP 114/58 | HR 101 | Temp 98.5°F | Resp 17 | Ht 60.0 in

## 2018-10-10 DIAGNOSIS — C7931 Secondary malignant neoplasm of brain: Secondary | ICD-10-CM | POA: Diagnosis not present

## 2018-10-10 DIAGNOSIS — C159 Malignant neoplasm of esophagus, unspecified: Secondary | ICD-10-CM | POA: Diagnosis not present

## 2018-10-10 DIAGNOSIS — G720 Drug-induced myopathy: Secondary | ICD-10-CM

## 2018-10-10 DIAGNOSIS — T380X5A Adverse effect of glucocorticoids and synthetic analogues, initial encounter: Secondary | ICD-10-CM

## 2018-10-10 DIAGNOSIS — L89309 Pressure ulcer of unspecified buttock, unspecified stage: Secondary | ICD-10-CM | POA: Diagnosis not present

## 2018-10-10 DIAGNOSIS — I82432 Acute embolism and thrombosis of left popliteal vein: Secondary | ICD-10-CM | POA: Diagnosis not present

## 2018-10-10 DIAGNOSIS — J449 Chronic obstructive pulmonary disease, unspecified: Secondary | ICD-10-CM | POA: Diagnosis not present

## 2018-10-10 DIAGNOSIS — I824Z2 Acute embolism and thrombosis of unspecified deep veins of left distal lower extremity: Secondary | ICD-10-CM

## 2018-10-10 DIAGNOSIS — F329 Major depressive disorder, single episode, unspecified: Secondary | ICD-10-CM | POA: Diagnosis not present

## 2018-10-10 LAB — URINALYSIS, COMPLETE (UACMP) WITH MICROSCOPIC
Bilirubin Urine: NEGATIVE
Glucose, UA: NEGATIVE mg/dL
Ketones, ur: NEGATIVE mg/dL
Nitrite: NEGATIVE
Protein, ur: NEGATIVE mg/dL
Specific Gravity, Urine: 1.008 (ref 1.005–1.030)
pH: 5 (ref 5.0–8.0)

## 2018-10-10 LAB — CBC WITH DIFFERENTIAL (CANCER CENTER ONLY)
Abs Immature Granulocytes: 0.2 10*3/uL — ABNORMAL HIGH (ref 0.00–0.07)
Basophils Absolute: 0 10*3/uL (ref 0.0–0.1)
Basophils Relative: 0 %
Eosinophils Absolute: 0.2 10*3/uL (ref 0.0–0.5)
Eosinophils Relative: 3 %
HCT: 25.4 % — ABNORMAL LOW (ref 36.0–46.0)
Hemoglobin: 8.2 g/dL — ABNORMAL LOW (ref 12.0–15.0)
Immature Granulocytes: 4 %
Lymphocytes Relative: 4 %
Lymphs Abs: 0.2 10*3/uL — ABNORMAL LOW (ref 0.7–4.0)
MCH: 31.7 pg (ref 26.0–34.0)
MCHC: 32.3 g/dL (ref 30.0–36.0)
MCV: 98.1 fL (ref 80.0–100.0)
Monocytes Absolute: 0.6 10*3/uL (ref 0.1–1.0)
Monocytes Relative: 12 %
Neutro Abs: 4.1 10*3/uL (ref 1.7–7.7)
Neutrophils Relative %: 77 %
Platelet Count: 171 10*3/uL (ref 150–400)
RBC: 2.59 MIL/uL — ABNORMAL LOW (ref 3.87–5.11)
RDW: 15.5 % (ref 11.5–15.5)
WBC Count: 5.3 10*3/uL (ref 4.0–10.5)
nRBC: 0.6 % — ABNORMAL HIGH (ref 0.0–0.2)

## 2018-10-10 LAB — CMP (CANCER CENTER ONLY)
ALT: 19 U/L (ref 0–44)
AST: 23 U/L (ref 15–41)
Albumin: 2 g/dL — ABNORMAL LOW (ref 3.5–5.0)
Alkaline Phosphatase: 274 U/L — ABNORMAL HIGH (ref 38–126)
Anion gap: 11 (ref 5–15)
BUN: 27 mg/dL — ABNORMAL HIGH (ref 8–23)
CO2: 25 mmol/L (ref 22–32)
Calcium: 8.5 mg/dL — ABNORMAL LOW (ref 8.9–10.3)
Chloride: 93 mmol/L — ABNORMAL LOW (ref 98–111)
Creatinine: 0.9 mg/dL (ref 0.44–1.00)
GFR, Est AFR Am: >60 mL/min (ref >60)
GFR, Estimated: >60 mL/min (ref >60)
Glucose, Bld: 108 mg/dL — ABNORMAL HIGH (ref 70–99)
Potassium: 3.2 mmol/L — ABNORMAL LOW (ref 3.5–5.1)
Sodium: 129 mmol/L — ABNORMAL LOW (ref 135–145)
Total Bilirubin: 0.7 mg/dL (ref 0.3–1.2)
Total Protein: 5.5 g/dL — ABNORMAL LOW (ref 6.5–8.1)

## 2018-10-10 LAB — PROTIME-INR
INR: 3.1 — ABNORMAL HIGH (ref 0.8–1.2)
Prothrombin Time: 31.8 seconds — ABNORMAL HIGH (ref 11.4–15.2)

## 2018-10-10 MED ORDER — HYDROCORTISONE 10 MG PO TABS: ORAL_TABLET | ORAL | 0 refills | Status: DC

## 2018-10-10 MED ORDER — CIPROFLOXACIN HCL 500 MG PO TABS: 500.0000 mg | ORAL_TABLET | Freq: Two times a day (BID) | ORAL | 0 refills | Status: DC

## 2018-10-10 NOTE — Progress Notes (Signed)
[facility] [place], [place] [address] [phone]   Interval Evaluation  Date of Service: 10/10/18 Patient Name: Kristin Williams Patient MRN: [id number] Patient DOB: May 30, 1945 Provider: Ventura Sellers, MD  Identifying Statement:  Kristin Williams is a 73 y.o. 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   01/30/2018 Initial Diagnosis   Metastasis to brain [facility])   01/31/2018 Imaging   MRI Brain W WO Contrast 01/31/18  IMPRESSION: 3 Tesla study does not disclose any additional lesions. Solitary metastasis in the right superior cerebellum measuring 2.9 x 2.6 x 3.0 cm with mild surrounding edema. 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   02/03/2018 - 02/08/2018 Radiation Therapy   SBRT with Dr. Isidore Moos on 02/03/18, 02/06/18, 02/08/18   06/12/2018 - 09/04/2018 Chemotherapy   FOLFOX with leucovorin q2weeks    09/12/2018 Imaging   MRI Brain  IMPRESSION: Right cerebellar metastatic deposit unchanged in size. Improvement in surrounding edema. No new enhancing lesions identified.   Negative for hydrocephalus.   Esophageal cancer, stage IV ([place])  01/08/2018 Imaging   CT AP W Constrast 01/08/18  IMPRESSION: 1. Nonobstructed, nondistended bowel. No acute inflammatory  process. 2. Moderate-sized hiatal hernia noted. 3. Low-density 1.7 cm left adrenal nodule likely to represent a small adenoma. Lower pole left renal cyst measuring 1.8 cm. 4. Moderate aortic atherosclerosis. 5. Lumbar spondylosis and facet arthropathy.   01/27/2018 Imaging   CT Chest W Contrast 01/27/18  IMPRESSION: 1. Large mass involving the distal half of the esophagus spanning approximately 11.7 cm is identified compatible with primary esophageal neoplasm. 2. Small posterior mediastinal and high right paratracheal lymph nodes are noted which may represent foci of metastatic adenopathy. 3. The small pulmonary nodules in the right lung are unchanged from 01/15/2016 and are favored to represent a benign process. 4. Aortic Atherosclerosis (ICD10-I70.0) and Emphysema (ICD10-J43.9). 5. Coronary artery atherosclerotic calcifications.   01/30/2018 Initial Diagnosis   Esophageal cancer, stage IV ([place])   02/03/2018 - 02/08/2018 Radiation Therapy   SBRT to oligo brain metastasis, in 3 fractions, under Dr. Isidore Moos    02/16/2018 PET scan   PET 02/16/18 IMPRESSION: 1. A 15.7 cm in length esophageal mass extends from the aortic arch level down to the distal esophagus, maximum SUV 22.4, compatible with malignancy. There is a small right lower neck level IV lymph node with lower than mediastinal blood pool activity, and a right upper paratracheal lymph node with just above mediastinal blood pool activity. 2. Faint speckled heterogeneity in the liver, for example in the lateral segment left hepatic lobe, is likely incidental/benign given the lack of correlate of CT finding. It might be prudent to obtain an MRI just to make sure that there is not a small early metastatic lesion in this vicinity. 3. Known right cerebellar vermian metastatic lesion, with expected hypo activity compared to the surrounding brain activity.  4. The right middle lobe pulmonary nodule measures 1.1 by 1.0 cm and is  mildly hypermetabolic with maximum SUV 2.6. This lesion measured 6 by 5 mm back on 12/06/2003 and 1.2 by 1.1 cm on 10/15/2015. I am uncertain what to make of the very slow growth and mildly accentuated metabolic activity. This could be a chronic granulomatous process. Low-grade adenocarcinoma of the lung seems unlikely to be so indolent as to only mildly increase over a 14 year period, but is presumably a differential diagnostic consideration. Surveillance is suggested. 5. Other imaging findings of potential clinical significance: Aortic Atherosclerosis (ICD10-I70.0). Descending and sigmoid colon diverticulosis. Gas in the urinary bladder, most common cause would be recent catheterization. Minimal chronic left ethmoid sinusitis.   02/27/2018 - 04/09/2018 Chemotherapy   Concurrent chemoRT with weekly carboplatin and Taxol 02/27/2018-04/03/18   02/27/2018 - 04/07/2018 Radiation Therapy   Concurrent ChemoRT 02/27/18-04/07/18   06/12/2018 - 09/04/2018 Chemotherapy   FOLFOX with leucovorin q2weeks    08/31/2018 PET scan      PET 08/30/18 IMPRESSION: 1. Decrease in size and metabolic activity of distal esophageal primary carcinoma. 2. No evidence metastatic adenopathy in the chest or abdomen. 3. Lesion in the cerebellum again noted. 4. Stable pulmonary nodule RIGHT middle lobe is not related to esophageal carcinoma as present in 2005 (smaller then). Favor indolent neoplasm such as carcinoid or hamartoma.   09/12/2018 Imaging   MRI Brain  IMPRESSION: Right cerebellar metastatic deposit unchanged in size. Improvement in surrounding edema. No new enhancing lesions identified.   Negative for hydrocephalus.   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 Ultrasound-guided biopsy is recommended.   01/10/2018 Initial Biopsy   Diagnsis from Breast Biopsy 01/10/18  Breast, right, needle core biopsy, 6 o'clock - INVASIVE DUCTAL CARCINOMA WITH CALCIFICATIONS, SEE COMMENT. - DUCTAL CARCINOMA IN SITU. 1 of 2 FINAL for Kristin Williams, Kristin Williams [phone]) Microscopic Comment The carcinoma appears grade 2. Prognostic markers will be ordered. Dr. [name] has reviewed the case. The case was called to [place] on 01/11/2018.   01/10/2018 Receptors her2   The tumor cells are NEGATIVE for Her2 (1+). Estrogen Receptor: 100%, POSITIVE, STRONG STAINING INTENSITY Progesterone Receptor: 100%, POSITIVE, STRONG STAINING INTENSITY Proliferation Marker Ki67: 15%   01/10/2018 Cancer Staging   Staging form: Breast, AJCC 8th Edition - Clinical stage from 01/10/2018: Stage IA (cT1b, cN0, cM0, G2, ER+, PR+, HER2-) - Signed by [name], MD on 02/03/2018   01/30/2018 Initial Diagnosis   Breast cancer ([place])   05/07/2018 -  Anti-estrogen oral therapy   Anastrozole 14m daily starting 05/07/2018     Interval History:  BCAMESHA FAROOQpresents for follow up today.  She describes overall decline since our visit this past month, describing feeling "low energy" and having "hard time walking".  She continues to have weakness in both legs, particularly with standing.  Also has "puffy face" and swollen legs that have persisted and worsened despite lasix.  She completed scheduled decadron taper at the end of last week.  Activity level is minimal walking with a walker, mainly from couch to bathroom.  Still in break from chemo with Dr. FBurr Medico    H+P (05/09/18) Patient presents today to discuss neurologic symptoms from brain metastasis.  She describes onset of gait instability, imbalance, and slurred speech in November 2019, which led to uncovering of large metastatic deposit in the cerebellum.  This was treated with fractionated  radiosurgery, which did cause temporary improvement in symptomatology and overall functional status.  Steroids were successfully weaned following radiation, but later restarted by Dr. Burr Medico. Starting ~2 months later, she began to experience recurrent decline in gait and speech.  MRI demonstrated inflammatory process around radiosurgery site, and decadron was increased to 38m TID with plan for gradual taper by Dr. SIsidore Moos  Now one week into therapy, she has not noticed any improvement.  She continues to ambulate with a walker, but denies any issues with memory, cognition, language, or coordination.  She does describe increased difficulty getting up stairs and up from the commode.  Otherwise able to write, use utensils, and button her shirt.  Currently on anastrozole for breast ca and will discuss today with Dr. FBurr Medicoplans for systemic therapy for esophageal cancer, now having completed local radiation.     Medications: Current Outpatient Medications on File Prior to Visit  Medication Sig Dispense Refill   albuterol (PROVENTIL HFA;VENTOLIN HFA) 108 (90 Base) MCG/ACT inhaler Inhale 2 puffs into the lungs every 6 (six) hours as needed for wheezing or shortness of breath.     atorvastatin (LIPITOR) 20 MG tablet Take 20 mg by mouth daily.     budesonide-formoterol (SYMBICORT) 160-4.5 MCG/ACT inhaler Inhale 1 puff into the lungs 2 (two) times daily.     cyclobenzaprine (FLEXERIL) 5 MG tablet Take 1 tablet (5 mg total) by mouth 3 (three) times daily as needed for muscle spasms. 270 tablet 0   dexamethasone (DECADRON) 1 MG tablet Take 363mdaily in the AM 60 tablet 1   furosemide (LASIX) 20 MG tablet Take 2 tablets (40 mg total) by mouth daily. Take additional 2044mm as needed 90 tablet 0   HYDROcodone-acetaminophen (NORCO) 7.5-325 MG tablet Take 1 tablet by mouth every 6 (six) hours as needed for moderate pain. 45 tablet 0   HYDROcodone-homatropine (HYCODAN) 5-1.5 MG/5ML syrup Take 5 mLs by mouth every 6  (six) hours as needed for cough. 240 mL 0   letrozole (FEMARA) 2.5 MG tablet Take 1 tablet (2.5 mg total) by mouth daily. 30 tablet 3   lidocaine (XYLOCAINE) 2 % solution MIX 1 PART LIDOCAINE, 1 PART H20 SWALLOW 10ML OF DILUTED MIXTURE, 20 MINUTES BEFORE MEALS AND AT BEDTIME UP TO 3 TIMES DAILY. 300 mL 0   lidocaine-prilocaine (EMLA) cream Apply 1 application topically as needed. (Patient taking differently: Apply 1 application topically as needed (port). ) 30 g 1   LORazepam (ATIVAN) 1 MG tablet Take 0.5 tablets (0.5 mg total) by mouth 3 (three) times daily as needed for anxiety or sleep. 45 tablet 0   magic mouthwash SOLN Take 5 mLs by mouth 4 (four) times daily. (Patient not taking: Reported on 09/26/2018) 60 mL 0   metoCLOPramide (REGLAN) 10 MG tablet Take 1 tablet (10 mg total) by mouth every 6 (six) hours as needed. for nausea 60 tablet 1   nystatin (MYCOSTATIN) 100000 UNIT/ML suspension TAKE 5 ML BY MOUTH  4 TIMES DAILY (Patient not taking: Reported on 09/26/2018) 60 mL 0   omeprazole (PRILOSEC) 40 MG capsule Take 40 mg by mouth daily.     ondansetron (ZOFRAN) 8 MG tablet Take 1 tablet (8 mg total) by mouth every 8 (eight) hours as needed for nausea or vomiting. (Patient not taking: Reported on 09/12/2018) 20  tablet 0   potassium chloride SA (K-DUR) 20 MEQ tablet Take 1 tablet (20 mEq total) by mouth daily. 30 tablet 0   predniSONE (DELTASONE) 50 MG tablet . 4 tablet 0   sucralfate (CARAFATE) 1 g tablet DISSOLVE ONE TABLET IN 10 MLS OF WATER AND SWALLOW UP TO FOUR TIMES DAILY TO SOOTHE THROAT (Patient taking differently: Take 1 g by mouth See admin instructions. DISSOLVE ONE TABLET IN 10 MLS OF WATER AND SWALLOW UP TO FOUR TIMES DAILY TO SOOTHE THROAT) 40 tablet 2   traZODone (DESYREL) 50 MG tablet Take 1 tablet (50 mg total) by mouth at bedtime. (Patient not taking: Reported on 09/12/2018) 30 tablet 1   warfarin (COUMADIN) 5 MG tablet Take 0.5-1 tablets (2.5-5 mg total) by mouth See  admin instructions. 2.5 MG on Monday, Wednesday and Saturday 5 Mg on Sunday, Tuesday, Thursday and Friday 30 tablet 2   No current facility-administered medications on file prior to visit.     Allergies:  Allergies  Allergen Reactions   Flonase [Fluticasone Propionate] Other (See Comments)    Nose bleeds   Past Medical History:  Past Medical History:  Diagnosis Date   Arthritis    Cancer ([place])    Cervical   Cancer ([place])    Breast   Cancer ([place])    Vaginal   Complication of anesthesia    COPD (chronic obstructive pulmonary disease) ([place])    Headache(784.0)    History of radiation therapy 02/28/19- 04/07/18   Esophagus, 1.8 Gy in 28 fractions for a total dose of 50.4 Gy.    History of radiation therapy 02/03/18- 02/08/18   Brain Rt Sup Cerebellum// 6 FFF photons.    Hyperlipidemia    Leg pain    Peripheral vascular disease ([facility])    Personal history of chemotherapy    Personal history of radiation therapy    PONV (postoperative nausea and vomiting)    Past Surgical History:  Past Surgical History:  Procedure Laterality Date   ABDOMINAL HYSTERECTOMY  1975   BREAST LUMPECTOMY  2005   GANGLION CYST EXCISION  1977   ILIAC ARTERY STENT  04/20/2004   CSD right iliac occlusive disease   IR IMAGING GUIDED PORT INSERTION  02/22/2018   Social History:  Social History   Socioeconomic History   Marital status: Married    Spouse name: Not on file   Number of children: 2   Years of education: Not on file   Highest education level: Not on file  Occupational History   Not on file  Social Needs   Financial resource strain: Not on file   Food insecurity    Worry: Not on file    Inability: Not on file   Transportation needs    Medical: No    Non-medical: No  Tobacco Use   Smoking status: Former Smoker    Packs/day: 0.50    Years: 40.00    Pack years: 20.00    Types: Cigarettes    Quit date: 01/27/2018    Years since quitting: 0.7    Smokeless tobacco: Never Used   Tobacco comment: She admits to smoking a few puffs daily- 05/02/18  Substance and Sexual Activity   Alcohol use: No   Drug use: No   Sexual activity: Not Currently  Lifestyle   Physical activity    Days per week: Not on file    Minutes per session: Not on file   Stress: Not on file  Relationships   Social connections  Talks on phone: Not on file    Gets together: Not on file    Attends religious service: Not on file    Active member of club or organization: Not on file    Attends meetings of clubs or organizations: Not on file    Relationship status: Not on file   Intimate partner violence    Fear of current or ex partner: No    Emotionally abused: No    Physically abused: No    Forced sexual activity: No  Other Topics Concern   Not on file  Social History Narrative   Resides in [place]. Resides with her husband.    Family History:  Family History  Problem Relation Age of Onset   Cancer Mother 47       Non-Hodgkins Disease T-Cell   Heart failure Father    COPD Father    Heart disease Brother 22       MI and Heart Disease before age 69   Heart attack Brother    Lung cancer Maternal Uncle    Cancer Cousin    Prostate cancer Maternal Uncle    Prostate cancer Brother        half brother   Breast cancer Neg Hx     Review of Systems: Constitutional: Denies fevers, chills or abnormal weight loss Eyes: Denies blurriness of vision Ears, nose, mouth, throat, and face: Denies mucositis or sore throat Respiratory: Denies cough, dyspnea or wheezes Cardiovascular: Denies palpitation, chest discomfort or lower extremity swelling Gastrointestinal:  +dyshpagia, odynophagia GU: +dysuria, burning Skin: Denies abnormal skin rashes Neurological: Per HPI Musculoskeletal: Denies joint pain, back or neck discomfort. No decrease in ROM Behavioral/Psych: Denies anxiety, disturbance in thought content, and mood  instability   Physical Exam: Vitals:   10/10/18 0921  BP: (!) 114/58  Pulse: (!) 101  Resp: 17  Temp: 98.5 F (36.9 C)  SpO2: 97%   KPS: 70. General: Alert, cooperative, pleasant, in no acute distress. Cushingoid. Head: Craniotomy scar noted, dry and intact. EENT: No conjunctival injection or scleral icterus. Oral mucosa moist Lungs: Resp effort normal Cardiac: Regular rate and rhythm Abdomen: Soft, non-distended abdomen Skin: Skin breakdown noted on arms and abdomen Extremities: No clubbing or edema  Neurologic Exam: Mental Status: Awake, alert, attentive to examiner. Oriented to self and environment. Language is fluent with intact comprehension.  Cranial Nerves: Visual acuity is grossly normal. Visual fields are full. Extra-ocular movements intact. No ptosis. Face is symmetric, tongue midline. Motor: Tone and bulk are normal. 3/5 symmetric weakness in hip girdle. Reflexes are symmetric, no pathologic reflexes present. Subtle impairment in fine coordination only. Sensory: Intact to light touch and temperature Gait: Non-independent  Labs: I have reviewed the data as listed    Component Value Date/Time   NA 136 09/26/2018 0953   K 3.6 09/26/2018 0953   CL 96 (L) 09/26/2018 0953   CO2 28 09/26/2018 0953   GLUCOSE 107 (H) 09/26/2018 0953   BUN 19 09/26/2018 0953   CREATININE 0.79 09/26/2018 0953   CALCIUM 9.0 09/26/2018 0953   PROT 6.3 (L) 09/26/2018 0953   ALBUMIN 2.8 (L) 09/26/2018 0953   AST 22 09/26/2018 0953   ALT 23 09/26/2018 0953   ALKPHOS 220 (H) 09/26/2018 0953   BILITOT 0.5 09/26/2018 0953   GFRNONAA >60 09/26/2018 0953   GFRAA >60 09/26/2018 0953   Lab Results  Component Value Date   WBC 9.2 09/26/2018   NEUTROABS 8.0 (H) 09/26/2018   HGB 9.4 (L)  09/26/2018   HCT 29.4 (L) 09/26/2018   MCV 103.2 (H) 09/26/2018   PLT 128 (L) 09/26/2018     Assessment/Plan 1. Metastasis to brain [facility])  Ms. 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  Pattern of motor dysfunction is clearly myopathic in nature.  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  She also has significant burden of dependent edema and skin breakdown secondary to corticosteroids.    Recommend initiating hydrocortisone "bridge" because of poor decadron tolerance: -64m AM/ 132mPM x 7 days, then -1045mM/ 83m55m x7 days, then -83mg42mly x7 days, then stop  It is essential that she remain active and continue her PT exercises during this time to regain strength in her hip girdle and clear dependent edema.  Because of severe weakness, dystaxia, and functional impairment at home, will order mechanical lift for home use.  For urinary urgency and burning, will order urinalysis today and call her with results.  We appreciate the opportunity to participate in the care of Kristin TR[place]OPe [place] following next brain MRI, or sooner as needed.  All questions were answered. The patient knows to call the clinic with any problems, questions or concerns. No barriers to learning were detected.  The total time spent in the encounter was 40 minutes and more than 50% was on counseling and review of test results   ZachaVentura SellersMedical Director of Neuro-Oncology [facility] Spring8/20 9:09 AM

## 2018-10-10 NOTE — Telephone Encounter (Signed)
Patient has UTI and CIPRO was ordered.  Notified daughter Maudie Mercury of instructions for antibiotic.  She had no questions at this time.

## 2018-10-10 NOTE — Progress Notes (Signed)
Patient will attempt to collect urine specimen at home and return to lab today by 4:00pm.     Faxed referral for lift chair to [facility]

## 2018-10-11 ENCOUNTER — Telehealth: Payer: Self-pay

## 2018-10-11 ENCOUNTER — Telehealth: Payer: Self-pay | Admitting: Internal Medicine

## 2018-10-11 ENCOUNTER — Telehealth: Payer: Self-pay | Admitting: Hematology

## 2018-10-11 NOTE — Telephone Encounter (Signed)
Scheduled appt per 7/29 sch message - pt aware of appt date and time   

## 2018-10-11 NOTE — Telephone Encounter (Signed)
Spoke with Joelene Millin patient's daughter regarding lab results. Per Dr. Burr Medico INR good continue current dose of Coumadin.  Potassium is low increase by 20 meq a day.  Hemoglobin is also low, Dr. Burr Medico recommends lab and blood transfusion within next 2 weeks.  She agreed and was told to call back if she becomes symptomatic.  She verbalized an understanding.  Currently being treated for UTI by Dr. Mickeal Skinner and started on Cortef.  Scheduling message was sent.

## 2018-10-11 NOTE — Telephone Encounter (Signed)
Scheduled appt per 7/28 los. °

## 2018-10-11 NOTE — Telephone Encounter (Signed)
-----   Message from [name], MD sent at 10/11/2018  6:44 AM EDT ----- Please let pt or her daughter know her lab results. INR is good, continue current dose coumadin. K low, increase KCL by 27meq/day, Hg 8.2, lower than before, may need blood transfusion, I recommend lab and blood transfusion in 1-2 weeks, or 1u blood in next few days if she is very symptomatic. Thanks   [name]  10/11/2018

## 2018-10-12 ENCOUNTER — Telehealth: Payer: Self-pay

## 2018-10-12 NOTE — Telephone Encounter (Signed)
Patient's daughter calls stating she has weakness and wonders if her appointment for blood could be moved up to early next week, scheduling message was sent to change her lab/blood transfusion to earlier.

## 2018-10-16 ENCOUNTER — Other Ambulatory Visit: Payer: Self-pay

## 2018-10-16 ENCOUNTER — Telehealth: Payer: Self-pay

## 2018-10-16 DIAGNOSIS — C7931 Secondary malignant neoplasm of brain: Secondary | ICD-10-CM

## 2018-10-16 DIAGNOSIS — C159 Malignant neoplasm of esophagus, unspecified: Secondary | ICD-10-CM

## 2018-10-16 MED ORDER — METOCLOPRAMIDE HCL 10 MG PO TABS: 10.0000 mg | ORAL_TABLET | Freq: Four times a day (QID) | ORAL | 1 refills | Status: AC | PRN

## 2018-10-16 NOTE — Telephone Encounter (Signed)
Patient's daughter Joelene Millin calls with concern of increased lower extremity and abdominal edema despite take Lasix 40 mg q am and 20 mg q pm.  She feels urine output is decent, but increased shortness of breath and needs assist to ambulate.  She is wearing compression stockings.  She has appointment tomorrow for blood transfusion but not seeing a provider.  [phone]

## 2018-10-17 ENCOUNTER — Inpatient Hospital Stay (HOSPITAL_BASED_OUTPATIENT_CLINIC_OR_DEPARTMENT_OTHER): Payer: Medicare Other | Admitting: Medical

## 2018-10-17 ENCOUNTER — Other Ambulatory Visit: Payer: Self-pay

## 2018-10-17 ENCOUNTER — Inpatient Hospital Stay: Payer: Medicare Other

## 2018-10-17 ENCOUNTER — Other Ambulatory Visit: Payer: Self-pay | Admitting: Hematology

## 2018-10-17 ENCOUNTER — Ambulatory Visit (HOSPITAL_COMMUNITY)
Admission: RE | Admit: 2018-10-17 | Discharge: 2018-10-17 | Disposition: A | Discharge status: Home / Self Care | Payer: Medicare Other | Source: Ambulatory Visit | Attending: Medical | Admitting: Medical

## 2018-10-17 ENCOUNTER — Inpatient Hospital Stay: Payer: Medicare Other | Attending: Hematology

## 2018-10-17 VITALS — Wt 173.4 lb

## 2018-10-17 DIAGNOSIS — R0602 Shortness of breath: Secondary | ICD-10-CM | POA: Diagnosis not present

## 2018-10-17 DIAGNOSIS — M7989 Other specified soft tissue disorders: Secondary | ICD-10-CM | POA: Diagnosis not present

## 2018-10-17 DIAGNOSIS — R109 Unspecified abdominal pain: Secondary | ICD-10-CM | POA: Insufficient documentation

## 2018-10-17 DIAGNOSIS — R609 Edema, unspecified: Secondary | ICD-10-CM | POA: Insufficient documentation

## 2018-10-17 DIAGNOSIS — Z95828 Presence of other vascular implants and grafts: Secondary | ICD-10-CM

## 2018-10-17 DIAGNOSIS — I509 Heart failure, unspecified: Secondary | ICD-10-CM | POA: Diagnosis not present

## 2018-10-17 DIAGNOSIS — D649 Anemia, unspecified: Secondary | ICD-10-CM | POA: Insufficient documentation

## 2018-10-17 DIAGNOSIS — I5031 Acute diastolic (congestive) heart failure: Secondary | ICD-10-CM | POA: Insufficient documentation

## 2018-10-17 DIAGNOSIS — C7931 Secondary malignant neoplasm of brain: Secondary | ICD-10-CM

## 2018-10-17 DIAGNOSIS — I824Z2 Acute embolism and thrombosis of unspecified deep veins of left distal lower extremity: Secondary | ICD-10-CM

## 2018-10-17 DIAGNOSIS — I517 Cardiomegaly: Secondary | ICD-10-CM | POA: Diagnosis not present

## 2018-10-17 DIAGNOSIS — C159 Malignant neoplasm of esophagus, unspecified: Secondary | ICD-10-CM

## 2018-10-17 DIAGNOSIS — J449 Chronic obstructive pulmonary disease, unspecified: Secondary | ICD-10-CM | POA: Insufficient documentation

## 2018-10-17 DIAGNOSIS — R600 Localized edema: Secondary | ICD-10-CM | POA: Insufficient documentation

## 2018-10-17 DIAGNOSIS — R42 Dizziness and giddiness: Secondary | ICD-10-CM | POA: Diagnosis not present

## 2018-10-17 DIAGNOSIS — R531 Weakness: Secondary | ICD-10-CM | POA: Diagnosis not present

## 2018-10-17 LAB — CMP (CANCER CENTER ONLY)
ALT: 16 U/L (ref 0–44)
AST: 19 U/L (ref 15–41)
Albumin: 2.5 g/dL — ABNORMAL LOW (ref 3.5–5.0)
Alkaline Phosphatase: 246 U/L — ABNORMAL HIGH (ref 38–126)
Anion gap: 11 (ref 5–15)
BUN: 16 mg/dL (ref 8–23)
CO2: 26 mmol/L (ref 22–32)
Calcium: 9 mg/dL (ref 8.9–10.3)
Chloride: 99 mmol/L (ref 98–111)
Creatinine: 0.87 mg/dL (ref 0.44–1.00)
GFR, Est AFR Am: >60 mL/min (ref >60)
GFR, Estimated: >60 mL/min (ref >60)
Glucose, Bld: 110 mg/dL — ABNORMAL HIGH (ref 70–99)
Potassium: 4.2 mmol/L (ref 3.5–5.1)
Sodium: 136 mmol/L (ref 135–145)
Total Bilirubin: 0.5 mg/dL (ref 0.3–1.2)
Total Protein: 6.1 g/dL — ABNORMAL LOW (ref 6.5–8.1)

## 2018-10-17 LAB — CBC WITH DIFFERENTIAL (CANCER CENTER ONLY)
Abs Immature Granulocytes: 0.08 10*3/uL — ABNORMAL HIGH (ref 0.00–0.07)
Basophils Absolute: 0 10*3/uL (ref 0.0–0.1)
Basophils Relative: 0 %
Eosinophils Absolute: 0 10*3/uL (ref 0.0–0.5)
Eosinophils Relative: 0 %
HCT: 32.1 % — ABNORMAL LOW (ref 36.0–46.0)
Hemoglobin: 9.7 g/dL — ABNORMAL LOW (ref 12.0–15.0)
Immature Granulocytes: 1 %
Lymphocytes Relative: 5 %
Lymphs Abs: 0.4 10*3/uL — ABNORMAL LOW (ref 0.7–4.0)
MCH: 31.6 pg (ref 26.0–34.0)
MCHC: 30.2 g/dL (ref 30.0–36.0)
MCV: 104.6 fL — ABNORMAL HIGH (ref 80.0–100.0)
Monocytes Absolute: 0.7 10*3/uL (ref 0.1–1.0)
Monocytes Relative: 9 %
Neutro Abs: 6 10*3/uL (ref 1.7–7.7)
Neutrophils Relative %: 85 %
Platelet Count: 252 10*3/uL (ref 150–400)
RBC: 3.07 MIL/uL — ABNORMAL LOW (ref 3.87–5.11)
RDW: 16.3 % — ABNORMAL HIGH (ref 11.5–15.5)
WBC Count: 7.2 10*3/uL (ref 4.0–10.5)
nRBC: 0 % (ref 0.0–0.2)

## 2018-10-17 LAB — PREPARE RBC (CROSSMATCH)

## 2018-10-17 LAB — ABO/RH: ABO/RH(D): AB POS

## 2018-10-17 MED ORDER — HEPARIN SOD (PORK) LOCK FLUSH 100 UNIT/ML IV SOLN
500.0000 [IU] | Freq: Once | INTRAVENOUS | Status: AC
  Administered 2018-10-17: 15:00:00 500 [IU]
  Filled 2018-10-17: qty 5

## 2018-10-17 MED ORDER — SODIUM CHLORIDE 0.9% FLUSH
10.0000 mL | Freq: Once | INTRAVENOUS | Status: AC
  Administered 2018-10-17: 10 mL
  Filled 2018-10-17: qty 10

## 2018-10-17 NOTE — Progress Notes (Signed)
[facility] Progress Note   Kristin Williams [id number] [birth year] 73 y.o.  Kristin Williams is managed by Dr. [name]  Actively treated with chemotherapy/immunotherapy/hormonal therapy: currently on a chemotherapy holiday  Last treated: 09/04/2018 with FOLFOXwith leucovorinq2weeks   Next scheduled appointment with provider: 11/08/2018  Assessment: Plan:    Metastatic esophageal cancer: Kristin Williams was last seen by Dr. Burr Medico on 09/26/2018.  She is currently on a chemotherapy holiday.  She was last treated with FOLFOX with leucovorin on 09/04/2018.  She is scheduled to be seen in follow-up on 11/08/2018.  Bilateral lower extremity edema with abdominal distention and shortness of breath: She continues on Lasix 40 mg every morning and 20 mg every afternoon.  She is also wearing compression stockings.  Despite this she has had an increase in the lower extremity edema and abdominal edema.  Bilateral decub x-ray returned showing no pleural effusion.  She will increase her Lasix to 40 mg twice daily beginning today.  She will call on Thursday regarding her bilateral lower extremity edema.  She will also check her weight daily.  She was instructed to call should she notice a 3 pound weight increase in a 24-hour period.  The patient's creatinine and BUN were within normal limits.  Symptomatic anemia: Kristin Williams labs from 10/02/2018 returned with a hemoglobin of 8.2 and a hematocrit of 25.4.  She was to receive 1 unit of packed red blood cells today but her hemoglobin returned at 9.7 today.  Her transfusion was canceled.  The above plans were discussed with the patient's daughter Kristin Williams over the patient's phone.  Please see After Visit Summary for patient specific instructions.  Future Appointments  Date Time Provider [place]  10/17/2018 12:15 PM [facility] LAB 5 [facility] None  10/17/2018 12:30 PM [facility] [place] FLUSH [facility] None  10/17/2018  1:00 PM Tanner, Van E., PA-C  [facility] None  10/17/2018  1:00 PM [facility] INFUSION [facility] None  11/08/2018  9:30 AM [facility] LAB 5 [facility] None  11/08/2018  9:45 AM [facility] [place] FLUSH [facility] None  11/08/2018 10:15 AM [name], MD [facility] None  12/08/2018 12:50 PM [facility] MR 3 [facility] [address]  12/11/2018  9:30 AM Vaslow, Acey Lav, MD [facility] None    No orders of the defined types were placed in this encounter.      Subjective:   Patient ID:  Kristin Williams is a 73 y.o. (DOB April 17, 1945) female.  Chief Complaint: No chief complaint on file.   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 She is currently on a chemotherapy holiday.  She was last treated with FOLFOX with leucovorin on 09/04/2018.  She is scheduled to be seen in follow-up on 11/08/2018. She was seen in the infusion room today as she was to have received 1 unit of packed red blood cells for symptomatic anemia.  Her hemoglobin returned at 9.7 today.  Her transfusion was canceled.  She continues to have bilateral lower extremity edema and abdominal distention with shortness of breath and difficulty eating due to her abdominal distention. She continues on Lasix 40 mg every morning and 20 mg every afternoon.  She is also wearing compression stockings.  Despite this she has had an increase in the lower extremity edema and abdominal edema.  Medications: I have reviewed the patient's current medications.  Allergies:  Allergies  Allergen Reactions  . Flonase [Fluticasone Propionate] Other (See Comments)  Nose bleeds    Past Medical History:  Diagnosis Date  . Arthritis   . Cancer (HCC)    Cervical  . Cancer ([place])    Breast  . Cancer ([place])    Vaginal  . Complication of anesthesia   . COPD (chronic obstructive pulmonary disease) ([place])   . Headache(784.0)   . History of radiation therapy 02/28/19- 04/07/18   Esophagus, 1.8 Gy in 28 fractions for a total  dose of 50.4 Gy.   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   . Hyperlipidemia   . Leg pain   . Peripheral vascular disease ([place])   . Personal history of chemotherapy   . Personal history of radiation therapy   . PONV (postoperative nausea and vomiting)     Past Surgical History:  Procedure Laterality Date  . ABDOMINAL HYSTERECTOMY  1975  . BREAST LUMPECTOMY  2005  . GANGLION CYST EXCISION  1977  . ILIAC ARTERY STENT  04/20/2004   CSD right iliac occlusive disease  . IR IMAGING GUIDED PORT INSERTION  02/22/2018    Family History  Problem Relation Age of Onset  . Cancer Mother 28       Non-Hodgkins Disease T-Cell  . Heart failure Father   . COPD Father   . Heart disease Brother 1       MI and Heart Disease before age 56  . Heart attack Brother   . Lung cancer Maternal Uncle   . Cancer Cousin   . Prostate cancer Maternal Uncle   . Prostate cancer Brother        half brother  . Breast cancer Neg Hx     Social History   Socioeconomic History  . Marital status: Married    Spouse name: Not on file  . Number of children: 2  . Years of education: Not on file  . Highest education level: Not on file  Occupational History  . Not on file  Social Needs  . Financial resource strain: Not on file  . Food insecurity    Worry: Not on file    Inability: Not on file  . Transportation needs    Medical: No    Non-medical: No  Tobacco Use  . Smoking status: Former Smoker    Packs/day: 0.50    Years: 40.00    Pack years: 20.00    Types: Cigarettes    Quit date: 01/27/2018    Years since quitting: 0.7  . Smokeless tobacco: Never Used  . Tobacco comment: She admits to smoking a few puffs daily- 05/02/18  Substance and Sexual Activity  . Alcohol use: No  . Drug use: No  . Sexual activity: Not Currently  Lifestyle  . Physical activity    Days per week: Not on file    Minutes per session: Not on file  . Stress: Not on  file  Relationships  . Social Herbalist on phone: Not on file    Gets together: Not on file    Attends religious service: Not on file    Active member of club or organization: Not on file    Attends meetings of clubs or organizations: Not on file    Relationship status: Not on file  . Intimate partner violence    Fear of current or ex partner: No    Emotionally abused: No    Physically abused: No    Forced sexual activity: No  Other Topics Concern  . Not on file  Social History Narrative   Resides in [place]. Resides with her husband.     Past Medical History, Surgical history, Social history, and Family history were reviewed and updated as appropriate.   Please see review of systems for further details on the patient's review from today.   Review of Systems:  Review of Systems  Constitutional: Negative for chills, diaphoresis and fever.  HENT: Negative for trouble swallowing and voice change.   Respiratory: Negative for cough, chest tightness, shortness of breath and wheezing.   Cardiovascular: Positive for leg swelling. Negative for chest pain and palpitations.  Gastrointestinal: Positive for abdominal distention. Negative for abdominal pain, constipation, diarrhea, nausea and vomiting.  Musculoskeletal: Negative for back pain and myalgias.  Neurological: Negative for dizziness, light-headedness and headaches.    Objective:   Physical Exam:  LMP 02/17/2018  ECOG: 1  Physical Exam Constitutional:      General: She is not in acute distress.    Appearance: She is not diaphoretic.  HENT:     Head: Normocephalic and atraumatic.     Mouth/Throat:     Pharynx: No oropharyngeal exudate.  Eyes:     General: No scleral icterus.       Right eye: No discharge.        Left eye: No discharge.  Neck:     Musculoskeletal: Normal range of motion and neck supple.  Cardiovascular:     Rate and Rhythm: Regular rhythm. Tachycardia present.     Heart sounds: Normal  heart sounds. No murmur. No friction rub. No gallop.   Pulmonary:     Effort: No respiratory distress.     Breath sounds: No wheezing or rales.     Comments: Decreased bibasilar lung sounds greater on the left than right.  Diffuse bibasilar crackles. Abdominal:     General: Bowel sounds are normal. There is distension.     Palpations: Abdomen is soft. There is no mass.     Tenderness: There is no abdominal tenderness. There is no guarding or rebound.  Musculoskeletal:     Right lower leg: Edema (1+ to the knees.) present.     Left lower leg: Edema (1+ to the knees.) present.  Lymphadenopathy:     Cervical: No cervical adenopathy.  Skin:    General: Skin is warm and dry.     Findings: No erythema or rash.  Neurological:     Mental Status: She is alert.     Coordination: Coordination normal.     Gait: Gait abnormal (Patient is ambulating with the use of a wheelchair.).  Psychiatric:        Behavior: Behavior normal.        Thought Content: Thought content normal.        Judgment: Judgment normal.     Lab Review:     Component Value Date/Time   NA 129 (L) 10/10/2018 0855   K 3.2 (L) 10/10/2018 0855   CL 93 (L) 10/10/2018 0855   CO2 25 10/10/2018 0855   GLUCOSE 108 (H) 10/10/2018 0855   BUN 27 (H) 10/10/2018 0855   CREATININE 0.90 10/10/2018 0855   CALCIUM 8.5 (L) 10/10/2018 0855   PROT 5.5 (L) 10/10/2018 0855   ALBUMIN 2.0 (L) 10/10/2018 0855   AST 23 10/10/2018 0855   ALT 19 10/10/2018 0855   ALKPHOS 274 (H) 10/10/2018 0855   BILITOT 0.7 10/10/2018 0855   GFRNONAA >60 10/10/2018 0855   GFRAA >60 10/10/2018  8413       Component Value Date/Time   WBC 5.3 10/10/2018 0855   WBC 6.0 08/24/2018 1356   RBC 2.59 (L) 10/10/2018 0855   HGB 8.2 (L) 10/10/2018 0855   HGB 13.1 01/06/2009 1325   HCT 25.4 (L) 10/10/2018 0855   HCT 39.2 01/06/2009 1325   PLT 171 10/10/2018 0855   PLT 255 01/06/2009 1325   MCV 98.1 10/10/2018 0855   MCV 95.7 01/06/2009 1325   MCH 31.7  10/10/2018 0855   MCHC 32.3 10/10/2018 0855   RDW 15.5 10/10/2018 0855   RDW 15.1 (H) 01/06/2009 1325   LYMPHSABS 0.2 (L) 10/10/2018 0855   LYMPHSABS 2.0 01/06/2009 1325   MONOABS 0.6 10/10/2018 0855   MONOABS 0.6 01/06/2009 1325   EOSABS 0.2 10/10/2018 0855   EOSABS 0.2 01/06/2009 1325   BASOSABS 0.0 10/10/2018 0855   BASOSABS 0.0 01/06/2009 1325   -------------------------------  Imaging from last 24 hours (if applicable):  Radiology interpretation: No results found.      This case was discussed with Dr. Burr Medico. She expressed agreement with my management of this patient.

## 2018-10-17 NOTE — Progress Notes (Signed)
Pt seen in infusion suite by PA Lucianne Lei.  Pt will not receive blood transfusion today per PA.  Pt transported to radiology for Xray then brought back to [facility] room 25 to discuss results with her family by phone with PA Lucianne Lei.  Pt not assessed by [facility] RN at this time, PA aware.  Pt transported via w/c to exit with belongings, spouse on the way to pick her up.

## 2018-10-17 NOTE — Patient Instructions (Addendum)

## 2018-10-17 NOTE — Progress Notes (Signed)
Per Lucianne Lei, PA: no blood transfusion today. Patient was taken to have an xray and seen by PA.

## 2018-10-18 ENCOUNTER — Other Ambulatory Visit: Payer: Self-pay | Admitting: Nurse Practitioner

## 2018-10-18 ENCOUNTER — Telehealth: Payer: Self-pay

## 2018-10-18 DIAGNOSIS — M545 Low back pain, unspecified: Secondary | ICD-10-CM

## 2018-10-18 MED ORDER — HYDROCODONE-ACETAMINOPHEN 7.5-325 MG PO TABS: 1.0000 | ORAL_TABLET | Freq: Four times a day (QID) | ORAL | 0 refills | Status: DC | PRN

## 2018-10-18 NOTE — Telephone Encounter (Signed)
Patient's daughter Joelene Millin calls with request for refill on Hydrocodone (pill form), last filled 10/02/2018 #45  Send into [employer] on file.

## 2018-10-19 ENCOUNTER — Telehealth: Payer: Self-pay

## 2018-10-19 NOTE — Telephone Encounter (Signed)
Patient calls stating edema a little better but not much, calling back to [employer] PA to let him know, per Lucianne Lei instructed patient to take 80 mg of Lasix through the weekend and call back on Monday to let us know how she is.    I spoke to patient about diet, foods high in salt and to avoid.  She verbalized an understanding.

## 2018-10-20 ENCOUNTER — Other Ambulatory Visit: Payer: Medicare Other

## 2018-10-21 LAB — BPAM RBC
Blood Product Expiration Date: 202008252359
Unit Type and Rh: 6200

## 2018-10-21 LAB — TYPE AND SCREEN
ABO/RH(D): AB POS
Antibody Screen: NEGATIVE
Unit division: 0

## 2018-10-23 ENCOUNTER — Other Ambulatory Visit: Payer: Self-pay

## 2018-10-23 ENCOUNTER — Telehealth: Payer: Self-pay

## 2018-10-23 DIAGNOSIS — I771 Stricture of artery: Secondary | ICD-10-CM

## 2018-10-23 NOTE — Telephone Encounter (Signed)
Patient's daughter calls with update.  Her edema continues - taking Lasix 80 mg daily, gait is unsteady, intake is good, but little output.  She had a right iliac stent placed 15 years ago, having pain in big toe.  Wants to know if they need to contact her vascular doctor to have stent checked.  She is concerned because despite being on diuretic the fluid/edema does not decrease.  [phone]    ____________________________ Message given to Dr. Burr Medico for review.

## 2018-10-23 NOTE — Telephone Encounter (Signed)
Spoke with patient's daughter Maudie Mercury regarding edema.  Dr. Burr Medico advised that her edema is probably not going to decrease much. Instructed her to wrap her lower extremities. She says she has.  Advised her to check with vascular MD about the iliac stent.  She will call them today.  I scheduled an appointment on Wednesday for her to be seen by Cira Rue NP.  She verbalized an understanding.

## 2018-10-24 NOTE — Progress Notes (Signed)
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   01/30/2018 Initial Diagnosis   Metastasis to brain [facility])   01/31/2018 Imaging   MRI Brain W WO Contrast 01/31/18  IMPRESSION: 3 Tesla study does not disclose any additional lesions. Solitary metastasis in the right superior cerebellum measuring 2.9 x 2.6 x 3.0 cm with mild surrounding edema. 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   02/03/2018 - 02/08/2018 Radiation Therapy   SBRT with Dr. Isidore Moos on 02/03/18, 02/06/18, 02/08/18   06/12/2018 - 09/04/2018 Chemotherapy   FOLFOX with leucovorin q2weeks    09/12/2018 Imaging   MRI Brain  IMPRESSION: Right cerebellar metastatic deposit unchanged in size. Improvement in surrounding edema. No new enhancing lesions identified.   Negative for hydrocephalus.   Esophageal cancer, stage IV ([place])  01/08/2018 Imaging   CT AP W Constrast 01/08/18  IMPRESSION: 1. Nonobstructed, nondistended bowel. No acute inflammatory process. 2. Moderate-sized hiatal hernia noted. 3. Low-density 1.7 cm left adrenal nodule likely to represent a small adenoma. Lower pole left renal cyst measuring 1.8 cm. 4. Moderate aortic atherosclerosis. 5. Lumbar  spondylosis and facet arthropathy.   01/27/2018 Imaging   CT Chest W Contrast 01/27/18  IMPRESSION: 1. Large mass involving the distal half of the esophagus spanning approximately 11.7 cm is identified compatible with primary esophageal neoplasm. 2. Small posterior mediastinal and high right paratracheal lymph nodes are noted which may represent foci of metastatic adenopathy. 3. The small pulmonary nodules in the right lung are unchanged from 01/15/2016 and are favored to represent a benign process. 4. Aortic Atherosclerosis (ICD10-I70.0) and Emphysema (ICD10-J43.9). 5. Coronary artery atherosclerotic calcifications.   01/30/2018 Initial Diagnosis   Esophageal cancer, stage IV ([place])   02/03/2018 - 02/08/2018 Radiation Therapy   SBRT to oligo brain metastasis, in 3 fractions, under Dr. Isidore Moos    02/16/2018 PET scan   PET 02/16/18 IMPRESSION: 1. A 15.7 cm in length esophageal mass extends from the aortic arch level down to the distal esophagus, maximum SUV 22.4, compatible with malignancy. There is a small right lower neck level IV lymph node with lower than mediastinal blood pool activity, and a right upper paratracheal lymph node with just above mediastinal blood pool activity. 2. Faint speckled heterogeneity in the liver, for example in the lateral segment left hepatic lobe, is likely incidental/benign given the lack of correlate of CT finding. It might be prudent to obtain an MRI just to make sure that there is not a small early metastatic lesion in this vicinity. 3. Known right cerebellar vermian metastatic lesion, with expected hypo activity compared to the surrounding brain activity. 4. The right middle lobe pulmonary nodule measures 1.1 by 1.0 cm and is mildly hypermetabolic with maximum SUV 2.6. This lesion measured 6 by 5 mm back on 12/06/2003 and 1.2 by  1.1 cm on 10/15/2015. I am uncertain what to make of the very slow growth and mildly accentuated metabolic activity.  This could be a chronic granulomatous process. Low-grade adenocarcinoma of the lung seems unlikely to be so indolent as to only mildly increase over a 14 year period, but is presumably a differential diagnostic consideration. Surveillance is suggested. 5. Other imaging findings of potential clinical significance: Aortic Atherosclerosis (ICD10-I70.0). Descending and sigmoid colon diverticulosis. Gas in the urinary bladder, most common cause would be recent catheterization. Minimal chronic left ethmoid sinusitis.   02/27/2018 - 04/09/2018 Chemotherapy   Concurrent chemoRT with weekly carboplatin and Taxol 02/27/2018-04/03/18   02/27/2018 - 04/07/2018 Radiation Therapy   Concurrent ChemoRT 02/27/18-04/07/18   06/12/2018 - 09/04/2018 Chemotherapy   FOLFOX with leucovorin q2weeks    08/31/2018 PET scan      PET 08/30/18 IMPRESSION: 1. Decrease in size and metabolic activity of distal esophageal primary carcinoma. 2. No evidence metastatic adenopathy in the chest or abdomen. 3. Lesion in the cerebellum again noted. 4. Stable pulmonary nodule RIGHT middle lobe is not related to esophageal carcinoma as present in 2005 (smaller then). Favor indolent neoplasm such as carcinoid or hamartoma.   09/12/2018 Imaging   MRI Brain  IMPRESSION: Right cerebellar metastatic deposit unchanged in size. Improvement in surrounding edema. No new enhancing lesions identified.   Negative for hydrocephalus.   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 Ultrasound-guided biopsy is recommended.   01/10/2018 Initial Biopsy   Diagnsis from Breast Biopsy 01/10/18  Breast, right, needle core biopsy, 6 o'clock - INVASIVE DUCTAL CARCINOMA WITH  CALCIFICATIONS, SEE COMMENT. - DUCTAL CARCINOMA IN SITU. 1 of 2 FINAL for FAYTH, TREFRY [phone]) Microscopic Comment The carcinoma appears grade 2. Prognostic markers will be ordered. Dr. [name] has reviewed the case. The case was called to [facility] on 01/11/2018.   01/10/2018 Receptors her2   The tumor cells are NEGATIVE for Her2 (1+). Estrogen Receptor: 100%, POSITIVE, STRONG STAINING INTENSITY Progesterone Receptor: 100%, POSITIVE, STRONG STAINING INTENSITY Proliferation Marker Ki67: 15%   01/10/2018 Cancer Staging   Staging form: Breast, AJCC 8th Edition - Clinical stage from 01/10/2018: Stage IA (cT1b, cN0, cM0, G2, ER+, PR+, HER2-) - Signed by [name], MD on 02/03/2018   01/30/2018 Initial Diagnosis   Breast cancer ([place])   05/07/2018 -  Anti-estrogen oral therapy   Anastrozole '1mg'$  daily starting 05/07/2018     CURRENT THERAPY: Chemo break   INTERVAL HISTORY: Ms. Brunsman returns today for f/u as scheduled. She continues to have edema in her arms, legs, and abdomen. She wears compression stockings. She has been taking 40 mg BID lasix since visit with Sandi Mealy, PA last week. She feels fullness and bloating in her abdomen that limits po intake. She drinks a lot of water. She is dizzy on standing and weak, cannot walk without assistance. She has family who can help her. She is never left alone. She does not want home health. She has intermittent nausea when she forgets to take reglan. Has loose BM 1-2 times per day which is stable for her since starting chemo. She was on Cipro recently for bladder infection. Denies fever, chills, chest pain. She has chronic cough with white sputum, has COPD. DOE  is stable. She has vascular f/u this week to check her right iliac stent. She continues steroid taper per Dr. Mickeal Skinner.    MEDICAL HISTORY:  Past Medical History:  Diagnosis Date   Arthritis    Cancer ([place])    Cervical   Cancer ([place])    Breast   Cancer  ([place])    Vaginal   Complication of anesthesia    COPD (chronic obstructive pulmonary disease) ([place])    Headache(784.0)    History of radiation therapy 02/28/19- 04/07/18   Esophagus, 1.8 Gy in 28 fractions for a total dose of 50.4 Gy.    History of radiation therapy 02/03/18- 02/08/18   Brain Rt Sup Cerebellum// 6 FFF photons.    Hyperlipidemia    Leg pain    Peripheral vascular disease ([place])    Personal history of chemotherapy    Personal history of radiation therapy    PONV (postoperative nausea and vomiting)     SURGICAL HISTORY: Past Surgical History:  Procedure Laterality Date   ABDOMINAL HYSTERECTOMY  1975   BREAST LUMPECTOMY  2005   GANGLION CYST EXCISION  1977   ILIAC ARTERY STENT  04/20/2004   CSD right iliac occlusive disease   IR IMAGING GUIDED PORT INSERTION  02/22/2018    I have reviewed the social history and family history with the patient and they are unchanged from previous note.  ALLERGIES:  is allergic to flonase [fluticasone propionate].  MEDICATIONS:  Current Outpatient Medications  Medication Sig Dispense Refill   albuterol (PROVENTIL HFA;VENTOLIN HFA) 108 (90 Base) MCG/ACT inhaler Inhale 2 puffs into the lungs every 6 (six) hours as needed for wheezing or shortness of breath.     atorvastatin (LIPITOR) 20 MG tablet Take 20 mg by mouth daily.     budesonide-formoterol (SYMBICORT) 160-4.5 MCG/ACT inhaler Inhale 1 puff into the lungs 2 (two) times daily.     cyclobenzaprine (FLEXERIL) 5 MG tablet Take 1 tablet (5 mg total) by mouth 3 (three) times daily as needed for muscle spasms. 270 tablet 0   dexamethasone (DECADRON) 1 MG tablet Take '3mg'$  daily in the AM 60 tablet 1   furosemide (LASIX) 20 MG tablet Take 2 tablets (40 mg total) by mouth daily. Take additional '20mg'$  pm as needed 90 tablet 0   HYDROcodone-acetaminophen (NORCO) 7.5-325 MG tablet Take 1 tablet by mouth every 6 (six) hours as needed for moderate pain. 45 tablet 0    HYDROcodone-homatropine (HYCODAN) 5-1.5 MG/5ML syrup Take 5 mLs by mouth every 6 (six) hours as needed for cough. 240 mL 0   hydrocortisone (CORTEF) 10 MG tablet Take '20mg'$  in AM, '10mg'$  in PM x7 days; then '10mg'$  in AM, '10mg'$  in PM x7 days, then '10mg'$  daily x7 days 42 tablet 0   letrozole (FEMARA) 2.5 MG tablet Take 1 tablet by mouth once daily 30 tablet 0   lidocaine (XYLOCAINE) 2 % solution MIX 1 PART LIDOCAINE, 1 PART H20 SWALLOW 10ML OF DILUTED MIXTURE, 20 MINUTES BEFORE MEALS AND AT BEDTIME UP TO 3 TIMES DAILY. 300 mL 0   lidocaine-prilocaine (EMLA) cream Apply 1 application topically as needed. (Patient taking differently: Apply 1 application topically as needed (port). ) 30 g 1   LORazepam (ATIVAN) 1 MG tablet Take 0.5 tablets (0.5 mg total) by mouth 3 (three) times daily as needed for anxiety or sleep. 45 tablet 0   magic mouthwash SOLN Take 5 mLs by mouth 4 (four) times daily. 60 mL 0   metoCLOPramide (REGLAN) 10 MG tablet  Take 1 tablet (10 mg total) by mouth every 6 (six) hours as needed. for nausea 180 tablet 1   omeprazole (PRILOSEC) 40 MG capsule Take 40 mg by mouth daily.     ondansetron (ZOFRAN) 8 MG tablet Take 1 tablet (8 mg total) by mouth every 8 (eight) hours as needed for nausea or vomiting. 20 tablet 0   potassium chloride SA (K-DUR) 20 MEQ tablet Take 1 tablet (20 mEq total) by mouth daily. 30 tablet 0   sucralfate (CARAFATE) 1 g tablet DISSOLVE ONE TABLET IN 10 MLS OF WATER AND SWALLOW UP TO FOUR TIMES DAILY TO SOOTHE THROAT (Patient taking differently: Take 1 g by mouth See admin instructions. DISSOLVE ONE TABLET IN 10 MLS OF WATER AND SWALLOW UP TO FOUR TIMES DAILY TO SOOTHE THROAT) 40 tablet 2   traZODone (DESYREL) 50 MG tablet Take 1 tablet (50 mg total) by mouth at bedtime. 30 tablet 1   warfarin (COUMADIN) 5 MG tablet Take 0.5-1 tablets (2.5-5 mg total) by mouth See admin instructions. 2.5 MG on Monday, Wednesday and Saturday 5 Mg on Sunday, Tuesday, Thursday and  Friday 30 tablet 2   No current facility-administered medications for this visit.     PHYSICAL EXAMINATION: ECOG PERFORMANCE STATUS: 3 - Symptomatic, >50% confined to bed  Vitals:   10/25/18 1040 10/25/18 1045  BP: (!) 114/57 125/71  Pulse: (!) 101   Resp:    Temp:    SpO2:     Filed Weights   10/25/18 0941  Weight: 181 lb 4.8 oz (82.2 kg)    GENERAL:alert, no distress and comfortable SKIN: thin tissue with scattered bruising  EYES: sclera clear LUNGS: decreased with bilateral crackles  HEART: tachycardic, regular rhythm, moderate pitting bilateral lower extremity edema ABDOMEN:abdomen soft, distended, non-tender and normal bowel sounds. Difficult to assess due to positioning  Musculoskeletal:no cyanosis of digits  NEURO: alert & oriented, generally weak   LABORATORY DATA:  I have reviewed the data as listed CBC Latest Ref Rng & Units 10/25/2018 10/17/2018 10/10/2018  WBC 4.0 - 10.5 K/uL 7.3 7.2 5.3  Hemoglobin 12.0 - 15.0 g/dL 9.6(L) 9.7(L) 8.2(L)  Hematocrit 36.0 - 46.0 % 31.6(L) 32.1(L) 25.4(L)  Platelets 150 - 400 K/uL 234 252 171     CMP Latest Ref Rng & Units 10/25/2018 10/17/2018 10/10/2018  Glucose 70 - 99 mg/dL 100(H) 110(H) 108(H)  BUN 8 - 23 mg/dL 12 16 27(H)  Creatinine 0.44 - 1.00 mg/dL 0.86 0.87 0.90  Sodium 135 - 145 mmol/L 137 136 129(L)  Potassium 3.5 - 5.1 mmol/L 3.1(L) 4.2 3.2(L)  Chloride 98 - 111 mmol/L 98 99 93(L)  CO2 22 - 32 mmol/L _0 Calcium 8.9 - 10.3 mg/dL 9.0 9.0 8.5(L)  Total Protein 6.5 - 8.1 g/dL 5.9(L) 6.1(L) 5.5(L)  Total Bilirubin 0.3 - 1.2 mg/dL 0.5 0.5 0.7  Alkaline Phos 38 - 126 U/L 172(H) 246(H) 274(H)  AST 15 - 41 U/L _1 ALT 0 - 44 U/L _2 RADIOGRAPHIC STUDIES: I have personally reviewed the radiological images as listed and agreed with the findings in the report. Dg Chest 2 View  Result Date: 10/26/2018 CLINICAL DATA:  Shortness of breath on exertion, crackles, history of metastatic cancer EXAM:  CHEST - 2 VIEW COMPARISON:  10/17/2018 FINDINGS: Small bilateral pleural effusions. No frank interstitial edema. No pneumothorax. The heart is normal in size. Right chest power port terminates cavoatrial junction. Moderate hiatal hernia. IMPRESSION:  Small bilateral pleural effusions.  No frank interstitial edema. Electronically Signed   By: Julian Hy M.D.   On: 10/26/2018 02:39     ASSESSMENT & PLAN:   1. Peripheral edema   -She continues to have peripheral edema, more in bilat lower extremities than arms. She has been on 80 mg lasix daily without much improvement -she is mildly dizzy and weak on standing, she is deconditioned. I encouraged her to remain as active with assistance as possible to maintain safety with ADLs. She is not left alone. However she is not interested in home health at this time. -she has mild orthostatic hypotension and tachycardia; I feel her increased lasix dose and GI output are causing some dehydration. Due to her edema, will give small amount of fluid today 500 cc NS over 2 hours.  -she has crackles in bases, chest xray shows small bilateral pleural effusions without significant interstitial edema. No thoracentesis indicated. Will monitor  -her abdomen is difficult to assess due to body habitus and she is in a wheelchair, will do abd [place] and paracentesis if she has significant ascites for symptom relief   -her edema is multifactorial, Albumin is 2.9, will do albumin infusion after paracentesis  -I recommend to reduce lasix to 40 mg daily, can take 40 mg AM /20 mg PM if edema increases significantly  -K 3.1, increase supplement to TID x3 days then BID -Hgb 9.6, no blood transfusion today -her daughter was on the phone, she is very involved, asked appropriate questions and took notes on today's recommendations.  -will check in with her via phone f/u next week; she will return in 2 weeks to see Dr. Burr Medico   Orders Placed This Encounter  Procedures   US Paracentesis     Standing Status:   Future    Standing Expiration Date:   10/25/2019    Order Specific Question:   If therapeutic, is there a maximum amount of fluid to be removed?    Answer:   Yes    Order Specific Question:   What is the maximum amount of fluide to be removed?    Answer:   5 L    Order Specific Question:   Are labs required for specimen collection?    Answer:   Yes    Order Specific Question:   Lab orders requested (DO NOT place separate lab orders, these will be automatically ordered during procedure specimen collection):    Answer:   Cytology - Non Pap    Order Specific Question:   Is Albumin medication needed?    Answer:   Yes    Order Specific Question:   A seperate Albumin medication order is required.    Answer:   I understand I need to place a separate order for albumin    Order Specific Question:   Reason for Exam (SYMPTOM  OR DIAGNOSIS REQUIRED)    Answer:   symptom relief    Order Specific Question:   Preferred imaging location?    Answer:   [facility]   DG Chest 2 View    Standing Status:   Future    Number of Occurrences:   1    Standing Expiration Date:   10/25/2019    Order Specific Question:   Reason for Exam (SYMPTOM  OR DIAGNOSIS REQUIRED)    Answer:   crackles, edema    Order Specific Question:   Preferred imaging location?    Answer:   [facility]  Order Specific Question:   Radiology Contrast Protocol - do NOT remove file path    Answer:   [email or web address]   All questions were answered. The patient knows to call the clinic with any problems, questions or concerns. No barriers to learning was detected. I spent 30 minutes counseling the patient face to face. The total time spent in the appointment was 40 minutes and more than 50% was on counseling and review of test results     Alla Feeling, NP 10/26/18

## 2018-10-25 ENCOUNTER — Inpatient Hospital Stay: Payer: Medicare Other

## 2018-10-25 ENCOUNTER — Ambulatory Visit (HOSPITAL_COMMUNITY)
Admission: RE | Admit: 2018-10-25 | Discharge: 2018-10-25 | Disposition: A | Discharge status: Home / Self Care | Payer: Medicare Other | Source: Ambulatory Visit | Attending: Nurse Practitioner | Admitting: Nurse Practitioner

## 2018-10-25 ENCOUNTER — Inpatient Hospital Stay (HOSPITAL_BASED_OUTPATIENT_CLINIC_OR_DEPARTMENT_OTHER): Payer: Medicare Other | Admitting: Nurse Practitioner

## 2018-10-25 ENCOUNTER — Other Ambulatory Visit: Payer: Self-pay

## 2018-10-25 ENCOUNTER — Telehealth: Payer: Self-pay | Admitting: Nurse Practitioner

## 2018-10-25 VITALS — BP 112/66 | HR 133 | Temp 98.0°F | Resp 18

## 2018-10-25 VITALS — BP 125/71 | HR 101 | Temp 98.2°F | Resp 18 | Ht 60.0 in | Wt 181.3 lb

## 2018-10-25 DIAGNOSIS — I824Z2 Acute embolism and thrombosis of unspecified deep veins of left distal lower extremity: Secondary | ICD-10-CM

## 2018-10-25 DIAGNOSIS — R609 Edema, unspecified: Secondary | ICD-10-CM | POA: Diagnosis not present

## 2018-10-25 DIAGNOSIS — R0609 Other forms of dyspnea: Secondary | ICD-10-CM | POA: Insufficient documentation

## 2018-10-25 DIAGNOSIS — Z95828 Presence of other vascular implants and grafts: Secondary | ICD-10-CM

## 2018-10-25 DIAGNOSIS — R0602 Shortness of breath: Secondary | ICD-10-CM | POA: Diagnosis not present

## 2018-10-25 DIAGNOSIS — C159 Malignant neoplasm of esophagus, unspecified: Secondary | ICD-10-CM

## 2018-10-25 DIAGNOSIS — R198 Other specified symptoms and signs involving the digestive system and abdomen: Secondary | ICD-10-CM

## 2018-10-25 DIAGNOSIS — R600 Localized edema: Secondary | ICD-10-CM

## 2018-10-25 DIAGNOSIS — I951 Orthostatic hypotension: Secondary | ICD-10-CM | POA: Diagnosis not present

## 2018-10-25 DIAGNOSIS — J449 Chronic obstructive pulmonary disease, unspecified: Secondary | ICD-10-CM | POA: Diagnosis not present

## 2018-10-25 DIAGNOSIS — D649 Anemia, unspecified: Secondary | ICD-10-CM | POA: Diagnosis not present

## 2018-10-25 DIAGNOSIS — C7931 Secondary malignant neoplasm of brain: Secondary | ICD-10-CM | POA: Diagnosis not present

## 2018-10-25 LAB — CBC WITH DIFFERENTIAL (CANCER CENTER ONLY)
Abs Immature Granulocytes: 0.06 10*3/uL (ref 0.00–0.07)
Basophils Absolute: 0 10*3/uL (ref 0.0–0.1)
Basophils Relative: 0 %
Eosinophils Absolute: 0.1 10*3/uL (ref 0.0–0.5)
Eosinophils Relative: 1 %
HCT: 31.6 % — ABNORMAL LOW (ref 36.0–46.0)
Hemoglobin: 9.6 g/dL — ABNORMAL LOW (ref 12.0–15.0)
Immature Granulocytes: 1 %
Lymphocytes Relative: 4 %
Lymphs Abs: 0.3 10*3/uL — ABNORMAL LOW (ref 0.7–4.0)
MCH: 31.2 pg (ref 26.0–34.0)
MCHC: 30.4 g/dL (ref 30.0–36.0)
MCV: 102.6 fL — ABNORMAL HIGH (ref 80.0–100.0)
Monocytes Absolute: 0.7 10*3/uL (ref 0.1–1.0)
Monocytes Relative: 10 %
Neutro Abs: 6.1 10*3/uL (ref 1.7–7.7)
Neutrophils Relative %: 84 %
Platelet Count: 234 10*3/uL (ref 150–400)
RBC: 3.08 MIL/uL — ABNORMAL LOW (ref 3.87–5.11)
RDW: 15.7 % — ABNORMAL HIGH (ref 11.5–15.5)
WBC Count: 7.3 10*3/uL (ref 4.0–10.5)
nRBC: 0 % (ref 0.0–0.2)

## 2018-10-25 LAB — CMP (CANCER CENTER ONLY)
ALT: 13 U/L (ref 0–44)
AST: 15 U/L (ref 15–41)
Albumin: 2.7 g/dL — ABNORMAL LOW (ref 3.5–5.0)
Alkaline Phosphatase: 172 U/L — ABNORMAL HIGH (ref 38–126)
Anion gap: 12 (ref 5–15)
BUN: 12 mg/dL (ref 8–23)
CO2: 27 mmol/L (ref 22–32)
Calcium: 9 mg/dL (ref 8.9–10.3)
Chloride: 98 mmol/L (ref 98–111)
Creatinine: 0.86 mg/dL (ref 0.44–1.00)
GFR, Est AFR Am: >60 mL/min (ref >60)
GFR, Estimated: >60 mL/min (ref >60)
Glucose, Bld: 100 mg/dL — ABNORMAL HIGH (ref 70–99)
Potassium: 3.1 mmol/L — ABNORMAL LOW (ref 3.5–5.1)
Sodium: 137 mmol/L (ref 135–145)
Total Bilirubin: 0.5 mg/dL (ref 0.3–1.2)
Total Protein: 5.9 g/dL — ABNORMAL LOW (ref 6.5–8.1)

## 2018-10-25 LAB — PROTIME-INR
INR: 2.2 — ABNORMAL HIGH (ref 0.8–1.2)
Prothrombin Time: 24.3 seconds — ABNORMAL HIGH (ref 11.4–15.2)

## 2018-10-25 MED ORDER — SODIUM CHLORIDE 0.9% FLUSH
10.0000 mL | Freq: Once | INTRAVENOUS | Status: AC
  Administered 2018-10-25: 14:00:00 10 mL
  Filled 2018-10-25: qty 10

## 2018-10-25 MED ORDER — HEPARIN SOD (PORK) LOCK FLUSH 100 UNIT/ML IV SOLN
500.0000 [IU] | Freq: Once | INTRAVENOUS | Status: AC
  Administered 2018-10-25: 14:00:00 500 [IU]
  Filled 2018-10-25: qty 5

## 2018-10-25 MED ORDER — SODIUM CHLORIDE 0.9% FLUSH
10.0000 mL | Freq: Once | INTRAVENOUS | Status: AC
  Administered 2018-10-25: 10 mL
  Filled 2018-10-25: qty 10

## 2018-10-25 MED ORDER — SODIUM CHLORIDE 0.9 % IV SOLN
Freq: Once | INTRAVENOUS | Status: AC
  Filled 2018-10-25: qty 250

## 2018-10-25 MED ORDER — HEPARIN SOD (PORK) LOCK FLUSH 100 UNIT/ML IV SOLN
500.0000 [IU] | Freq: Once | INTRAVENOUS | Status: AC
  Administered 2018-10-25: 09:00:00 500 [IU]
  Filled 2018-10-25: qty 5

## 2018-10-25 MED ORDER — SODIUM CHLORIDE 0.9 % IV SOLN
Freq: Once | INTRAVENOUS | Status: AC
  Administered 2018-10-25: 12:00:00 via INTRAVENOUS
  Filled 2018-10-25: qty 250

## 2018-10-25 NOTE — Telephone Encounter (Signed)
Per 8/12 los appts already scheduled.

## 2018-10-25 NOTE — Patient Instructions (Signed)
Orthostatic Hypotension °Blood pressure is a measurement of how strongly, or weakly, your blood is pressing against the walls of your arteries. Orthostatic hypotension is a sudden drop in blood pressure that happens when you quickly change positions, such as when you get up from sitting or lying down. °Arteries are blood vessels that carry blood from your heart throughout your body. When blood pressure is too low, you may not get enough blood to your brain or to the rest of your organs. This can cause weakness, light-headedness, rapid heartbeat, and fainting. This can last for just a few seconds or for up to a few minutes. Orthostatic hypotension is usually not a serious problem. However, if it happens frequently or gets worse, it may be a sign of something more serious. °What are the causes? °This condition may be caused by: °· Sudden changes in posture, such as standing up quickly after you have been sitting or lying down. °· Blood loss. °· Loss of body fluids (dehydration). °· Heart problems. °· Hormone (endocrine) problems. °· Pregnancy. °· Severe infection. °· Lack of certain nutrients. °· Severe allergic reactions (anaphylaxis). °· Certain medicines, such as blood pressure medicine or medicines that make the body lose excess fluids (diuretics). Sometimes, this condition can be caused by not taking medicine as directed, such as taking too much of a certain medicine. °What increases the risk? °The following factors may make you more likely to develop this condition: °· Age. Risk increases as you get older. °· Conditions that affect the heart or the central nervous system. °· Taking certain medicines, such as blood pressure medicine or diuretics. °· Being pregnant. °What are the signs or symptoms? °Symptoms of this condition may include: °· Weakness. °· Light-headedness. °· Dizziness. °· Blurred vision. °· Fatigue. °· Rapid heartbeat. °· Fainting, in severe cases. °How is this diagnosed? °This condition is  diagnosed based on: °· Your medical history. °· Your symptoms. °· Your blood pressure measurement. Your health care provider will check your blood pressure when you are: °? Lying down. °? Sitting. °? Standing. °A blood pressure reading is recorded as two numbers, such as "120 over 80" (or 120/80). The first ("top") number is called the systolic pressure. It is a measure of the pressure in your arteries as your heart beats. The second ("bottom") number is called the diastolic pressure. It is a measure of the pressure in your arteries when your heart relaxes between beats. Blood pressure is measured in a unit called mm Hg. Healthy blood pressure for most adults is 120/80. If your blood pressure is below 90/60, you may be diagnosed with hypotension. °Other information or tests that may be used to diagnose orthostatic hypotension include: °· Your other vital signs, such as your heart rate and temperature. °· Blood tests. °· Tilt table test. For this test, you will be safely secured to a table that moves you from a lying position to an upright position. Your heart rhythm and blood pressure will be monitored during the test. °How is this treated? °This condition may be treated by: °· Changing your diet. This may involve eating more salt (sodium) or drinking more water. °· Taking medicines to raise your blood pressure. °· Changing the dosage of certain medicines you are taking that might be lowering your blood pressure. °· Wearing compression stockings. These stockings help to prevent blood clots and reduce swelling in your legs. °In some cases, you may need to go to the hospital for: °· Fluid replacement. This means you will   receive fluids through an IV. °· Blood replacement. This means you will receive donated blood through an IV (transfusion). °· Treating an infection or heart problems, if this applies. °· Monitoring. You may need to be monitored while medicines that you are taking wear off. °Follow these instructions  at home: °Eating and drinking ° °· Drink enough fluid to keep your urine pale yellow. °· Eat a healthy diet, and follow instructions from your health care provider about eating or drinking restrictions. A healthy diet includes: °? Fresh fruits and vegetables. °? Whole grains. °? Lean meats. °? Low-fat dairy products. °· Eat extra salt only as directed. Do not add extra salt to your diet unless your health care provider told you to do that. °· Eat frequent, small meals. °· Avoid standing up suddenly after eating. °Medicines °· Take over-the-counter and prescription medicines only as told by your health care provider. °? Follow instructions from your health care provider about changing the dosage of your current medicines, if this applies. °? Do not stop or adjust any of your medicines on your own. °General instructions ° °· Wear compression stockings as told by your health care provider. °· Get up slowly from lying down or sitting positions. This gives your blood pressure a chance to adjust. °· Avoid hot showers and excessive heat as directed by your health care provider. °· Return to your normal activities as told by your health care provider. Ask your health care provider what activities are safe for you. °· Do not use any products that contain nicotine or tobacco, such as cigarettes, e-cigarettes, and chewing tobacco. If you need help quitting, ask your health care provider. °· Keep all follow-up visits as told by your health care provider. This is important. °Contact a health care provider if you: °· Vomit. °· Have diarrhea. °· Have a fever for more than 2-3 days. °· Feel more thirsty than usual. °· Feel weak and tired. °Get help right away if you: °· Have chest pain. °· Have a fast or irregular heartbeat. °· Develop numbness in any part of your body. °· Cannot move your arms or your legs. °· Have trouble speaking. °· Become sweaty or feel light-headed. °· Faint. °· Feel short of breath. °· Have trouble staying  awake. °· Feel confused. °Summary °· Orthostatic hypotension is a sudden drop in blood pressure that happens when you quickly change positions. °· Orthostatic hypotension is usually not a serious problem. °· It is diagnosed by having your blood pressure taken lying down, sitting, and then standing. °· It may be treated by changing your diet or adjusting your medicines. °This information is not intended to replace advice given to you by your health care provider. Make sure you discuss any questions you have with your health care provider. °Document Released: 02/19/2002 Document Revised: 08/25/2017 Document Reviewed: 08/25/2017 °Elsevier Patient Education © 2020 [employer] ° °

## 2018-10-26 ENCOUNTER — Telehealth: Payer: Self-pay | Admitting: Nurse Practitioner

## 2018-10-26 ENCOUNTER — Encounter: Payer: Self-pay | Admitting: Nurse Practitioner

## 2018-10-26 ENCOUNTER — Telehealth: Payer: Self-pay

## 2018-10-26 ENCOUNTER — Ambulatory Visit (HOSPITAL_COMMUNITY)
Admission: RE | Admit: 2018-10-26 | Discharge: 2018-10-26 | Disposition: A | Discharge status: Home / Self Care | Payer: Medicare Other | Source: Ambulatory Visit | Attending: Family | Admitting: Family

## 2018-10-26 ENCOUNTER — Other Ambulatory Visit: Payer: Self-pay

## 2018-10-26 ENCOUNTER — Encounter: Payer: Self-pay | Admitting: Family

## 2018-10-26 ENCOUNTER — Ambulatory Visit (INDEPENDENT_AMBULATORY_CARE_PROVIDER_SITE_OTHER): Payer: Medicare Other | Admitting: Family

## 2018-10-26 VITALS — BP 111/59 | HR 91 | Temp 96.7°F | Resp 14 | Ht 60.0 in | Wt 180.0 lb

## 2018-10-26 DIAGNOSIS — Z95828 Presence of other vascular implants and grafts: Secondary | ICD-10-CM

## 2018-10-26 DIAGNOSIS — Z87891 Personal history of nicotine dependence: Secondary | ICD-10-CM | POA: Diagnosis not present

## 2018-10-26 DIAGNOSIS — I771 Stricture of artery: Secondary | ICD-10-CM | POA: Diagnosis not present

## 2018-10-26 NOTE — Telephone Encounter (Signed)
Scheduled appt per 8/13 sch message - pt daughter is aware of appt date and time.  

## 2018-10-26 NOTE — Telephone Encounter (Signed)
Patient called for chest x-ray results, showed small bilateral pleural effusions. She verbalized an understanding.

## 2018-10-26 NOTE — Progress Notes (Signed)
[facility] [place]   CC: Follow up peripheral artery occlusive disease  History of Present Illness Kristin Williams is a 73 y.o. female who had a PTA and stenting of the right Iliac artery on 04/20/2004 by Dr. Scot Dock.  Before the above interventions she had just finished chemotherapy for breast cancer and developed black right great toe and pain in that toe.  She was lost to follow up for a year at some point.  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   Dtr states pt has seen a neurosurgeon for back issues, compression fractures, HNP. Is seeing neuro-oncologist at [facility] for brain cancer.   Her right great toe and 2nd toe started hurting about a month ago, is worsening, worse when feet are elevated.   Dtr states that pt sleeps in a motorized w/c, back reclines somewhat, legs are still dependent.   She does have low back issues with DDD and arthritis and is seeing a chiropractor who is helping with sciatic pain. She states she gets the pain when she stands too long or walks a long distance but denies claudication type symptoms.   She isusing C-PAP   Diabetic:yes, pt states her April 2018 A1C was 6.1 Tobacco use former smoker (quit in April 2020, started smoking at age 21yrs); states she is working on trying to quit  Pt meds include:  Statin :Yes Betablocker: No ASA: no Other anticoagulants/antiplatelets: coumadin for DVT of left leg, started by her oncologist since about December 2019   Past Medical History:  Diagnosis Date  . Arthritis   . Cancer (HCC)    Cervical  . Cancer ([place])    Breast  . Cancer ([place])    Vaginal  . Complication of anesthesia   . COPD (chronic obstructive pulmonary disease) ([place])   . Headache(784.0)   . History of radiation therapy 02/28/19- 04/07/18   Esophagus, 1.8 Gy in 28 fractions for a total dose of 50.4 Gy.   Marland Kitchen History of radiation therapy 02/03/18-  02/08/18   Brain Rt Sup Cerebellum// 6 FFF photons.   . Hyperlipidemia   . Leg pain   . Peripheral vascular disease ([place])   . Personal history of chemotherapy   . Personal history of radiation therapy   . PONV (postoperative nausea and vomiting)     Social History Social History   Tobacco Use  . Smoking status: Former Smoker    Packs/day: 0.50    Years: 40.00    Pack years: 20.00    Types: Cigarettes    Quit date: 01/27/2018    Years since quitting: 0.7  . Smokeless tobacco: Never Used  . Tobacco comment: She admits to smoking a few puffs daily- 05/02/18  Substance Use Topics  . Alcohol use: No  . Drug use: No    Family History Family History  Problem Relation Age of Onset  . Cancer Mother 36       Non-Hodgkins Disease T-Cell  . Heart failure Father   . COPD Father   . Heart disease Brother 78       MI and Heart Disease before age 68  . Heart attack Brother   . Lung cancer Maternal Uncle   . Cancer Cousin   . Prostate cancer Maternal Uncle   . Prostate cancer Brother        half brother  . Breast cancer Neg Hx     Past Surgical History:  Procedure Laterality Date  . ABDOMINAL HYSTERECTOMY  1975  . BREAST LUMPECTOMY  2005  . GANGLION CYST EXCISION  1977  . ILIAC ARTERY STENT  04/20/2004   CSD right iliac occlusive disease  . IR IMAGING GUIDED PORT INSERTION  02/22/2018    Allergies  Allergen Reactions  . Flonase [Fluticasone Propionate] Other (See Comments)    Nose bleeds    Current Outpatient Medications  Medication Sig Dispense Refill  . albuterol (PROVENTIL HFA;VENTOLIN HFA) 108 (90 Base) MCG/ACT inhaler Inhale 2 puffs into the lungs every 6 (six) hours as needed for wheezing or shortness of breath.    Marland Kitchen atorvastatin (LIPITOR) 20 MG tablet Take 20 mg by mouth daily.    . budesonide-formoterol (SYMBICORT) 160-4.5 MCG/ACT inhaler Inhale 1 puff into the lungs 2 (two) times daily.    . cyclobenzaprine (FLEXERIL) 5 MG tablet Take 1 tablet (5 mg total) by  mouth 3 (three) times daily as needed for muscle spasms. 270 tablet 0  . dexamethasone (DECADRON) 1 MG tablet Take 3mg  daily in the AM 60 tablet 1  . furosemide (LASIX) 20 MG tablet Take 2 tablets (40 mg total) by mouth daily. Take additional 20mg  pm as needed 90 tablet 0  . HYDROcodone-acetaminophen (NORCO) 7.5-325 MG tablet Take 1 tablet by mouth every 6 (six) hours as needed for moderate pain. 45 tablet 0  . HYDROcodone-homatropine (HYCODAN) 5-1.5 MG/5ML syrup Take 5 mLs by mouth every 6 (six) hours as needed for cough. 240 mL 0  . hydrocortisone (CORTEF) 10 MG tablet Take 20mg  in AM, 10mg  in PM x7 days; then 10mg  in AM, 10mg  in PM x7 days, then 10mg  daily x7 days 42 tablet 0  . hydrocortisone (CORTEF) 20 MG tablet TAKE 1 TABLET IN THE MORNING AND 1 2 (ONE HALF) TAB BY MOUTH IN THE EVENING FOR 7 DAYS THEN 1 2 (ONE HALF) TAB IN THE MORNING AND 1 2 (ONE H    . letrozole (FEMARA) 2.5 MG tablet Take 1 tablet by mouth once daily 30 tablet 0  . lidocaine (XYLOCAINE) 2 % solution MIX 1 PART LIDOCAINE, 1 PART H20 SWALLOW 10ML OF DILUTED MIXTURE, 20 MINUTES BEFORE MEALS AND AT BEDTIME UP TO 3 TIMES DAILY. 300 mL 0  . lidocaine-prilocaine (EMLA) cream Apply 1 application topically as needed. (Patient taking differently: Apply 1 application topically as needed (port). ) 30 g 1  . LORazepam (ATIVAN) 1 MG tablet Take 0.5 tablets (0.5 mg total) by mouth 3 (three) times daily as needed for anxiety or sleep. 45 tablet 0  . magic mouthwash SOLN Take 5 mLs by mouth 4 (four) times daily. 60 mL 0  . metoCLOPramide (REGLAN) 10 MG tablet Take 1 tablet (10 mg total) by mouth every 6 (six) hours as needed. for nausea 180 tablet 1  . omeprazole (PRILOSEC) 40 MG capsule Take 40 mg by mouth daily.    . ondansetron (ZOFRAN) 8 MG tablet Take 1 tablet (8 mg total) by mouth every 8 (eight) hours as needed for nausea or vomiting. 20 tablet 0  . potassium chloride SA (K-DUR) 20 MEQ tablet Take 1 tablet (20 mEq total) by mouth  daily. 30 tablet 0  . sucralfate (CARAFATE) 1 g tablet DISSOLVE ONE TABLET IN 10 MLS OF WATER AND SWALLOW UP TO FOUR TIMES DAILY TO SOOTHE THROAT (Patient taking differently: Take 1 g by mouth See admin instructions. DISSOLVE ONE TABLET IN 10 MLS OF WATER AND SWALLOW UP TO FOUR TIMES DAILY TO SOOTHE THROAT) 40  tablet 2  . traZODone (DESYREL) 50 MG tablet Take 1 tablet (50 mg total) by mouth at bedtime. 30 tablet 1  . warfarin (COUMADIN) 5 MG tablet Take 0.5-1 tablets (2.5-5 mg total) by mouth See admin instructions. 2.5 MG on Monday, Wednesday and Saturday 5 Mg on Sunday, Tuesday, Thursday and Friday 30 tablet 2   No current facility-administered medications for this visit.     ROS: See HPI for pertinent positives and negatives.   Physical Examination  Vitals:   10/26/18 1141  BP: (!) 111/59  Pulse: 91  Resp: 14  Temp: (!) 96.7 F (35.9 C)  TempSrc: Temporal  SpO2: 97%  Weight: 180 lb (81.6 kg)  Height: 5' (1.524 m)   Body mass index is 35.15 kg/m.  General: A&O x 3, WDWN, obese female. Gait: seated in w/c HEENT: No gross abnormalities.  Pulmonary: Respirations are non labored, fair air movement in all fields, no rales, rhonchi, or wheezes. +loose non productive cough Cardiac: regular rhythm, no detected murmur.         Carotid Bruits Right Left   Negative Negative   Radial pulses are 2+ palpable bilaterally   Adominal aortic pulse is not palpable                         VASCULAR EXAM: Extremities with ischemic changes: dependent rubor in right 1st and 2nd toes, without Gangrene; without open wounds. 3+ pitting edema in both lower legs.                                                                                                          LE Pulses Right Left       FEMORAL  not palpable seated, large panus  not palpable        POPLITEAL  not palpable   not palpable       POSTERIOR TIBIAL  not palpable   not palpable        DORSALIS PEDIS      ANTERIOR TIBIAL  not palpable  not palpable    Abdomen: soft, NT, no palpable masses. Skin: no rashes, no cellulitis, no ulcers noted. Musculoskeletal: no muscle wasting or atrophy.  Neurologic: A&O X 3; appropriate affect, Sensation is normal; MOTOR FUNCTION:  moving all extremities equally, motor strength 5/5 throughout. Speech is fluent/normal. CN 2-12 intact. Psychiatric: Thought content is normal, mood appropriate for clinical situation.    ASSESSMENT: NAVINA WOHLERS is a 73 y.o. female who is s/p PTA and stenting of the right Iliac artery on 04/20/2004. Shehas noclaudication symptoms with walking.There are no signs of ischemia in her feet or legs.  Bilateral ABI remains normal with tri and biphasic waveforms, bilateral TBI have declined.   Her right PT pulse was 2+ palpable at her last visit with me. Today both lower legs are edematous. Right 1st and 2nd toes with dependent rubor and painful.  Left ABI remains normal with triphasic waveforms; right ABI decline from normal to 0.72 with monophasic waveforms (were tri and biphasic  in July 2019).  She has stage 4 esophogeal cancer with brain mets, breast cancer, needs a paracentesis scheduled for next Monday. Has dependent edema in both legs, is frail, does have a norma serum creatinine as of yesterday.   Her atherosclerotic risk factors include active smoking x 54 years, well controlled DM, obesity, history of breast and cervical cancer, and COPD.   DATA  ABI Findings (10-26-18): +---------+------------------+-----+----------+--------------------------------+ Right    Rt Pressure (mmHg)IndexWaveform  Comment                          +---------+------------------+-----+----------+--------------------------------+ Brachial 113                                                               +---------+------------------+-----+----------+--------------------------------+ PTA      81                0.72 monophasic                                  +---------+------------------+-----+----------+--------------------------------+ DP       69                0.61 monophasic                                 +---------+------------------+-----+----------+--------------------------------+ Great Toe0                 0.00           unable to obtain due to low                                                amplitude waveforms              +---------+------------------+-----+----------+--------------------------------+  +---------+------------------+-----+---------+---------------------------+ Left     Lt Pressure (mmHg)IndexWaveform Comment                     +---------+------------------+-----+---------+---------------------------+ Brachial                                 restricted limb, lumpectomy +---------+------------------+-----+---------+---------------------------+ PTA      137               1.21 triphasic                            +---------+------------------+-----+---------+---------------------------+ DP       117               1.04 triphasic                            +---------+------------------+-----+---------+---------------------------+ Great Toe72                0.64                                      +---------+------------------+-----+---------+---------------------------+  +-------+-----------+-----------+------------+------------+  ABI/TBIToday's ABIToday's TBIPrevious ABIPrevious TBI +-------+-----------+-----------+------------+------------+ Right  0.72       0          1.10        0.54         +-------+-----------+-----------+------------+------------+ Left   1.21       0.64       1.15        0.66         +-------+-----------+-----------+------------+------------+  Right ABIs and TBIs appear decreased compared to prior study on 10/10/2017. Left ABIs and TBIs appear essentially unchanged.   Summary: Right: Resting right  ankle-brachial index indicates moderate right lower extremity arterial disease. The right toe-brachial index is abnormal. RT great toe pressure = 0 mmHg.  Left: Resting left ankle-brachial index is within normal range. No evidence of significant left lower extremity arterial disease. The left toe-brachial index is abnormal. LT Great toe pressure = 72 mmHg.   PLAN:  The patient was counseled re smoking cessation and given several free resources re smoking cessation.  I advised her to exercise her legs at least a total of 30 minutes daily.  Based on the patient's vascular studies and examination, pt will return to clinic in 1 year for ABI's. I advised her to notify us if she develops concerns re the circulation in her feet or legs.  Marland Kitchen   PLAN:  Based on the patient's vascular studies, HPI, and examination, and after discussing with Dr. Oneida Alar, pt will return to clinic in ASAP to discuss with Dr. Scot Dock how to address 0 TBI on the right, 1 month hx right great toe and 2nd to pain in the setting of this frail pt with stage 4 metastatic cancer, dependent edema, is scheduled for paracentesis next Monday.   I discussed in depth with the patient the nature of atherosclerosis, and emphasized the importance of maximal medical management including strict control of blood pressure, blood glucose, and lipid levels, obtaining regular exercise, and continued cessation of smoking.  The patient is aware that without maximal medical management the underlying atherosclerotic disease process will progress, limiting the benefit of any interventions.  The patient was given information about PAD including signs, symptoms, treatment, what symptoms should prompt the patient to seek immediate medical care, and risk reduction measures to take.  Clemon Chambers, RN, MSN, FNP-C [facility] Phone: [phone]  Clinic MD: [facility]  10/26/18 11:46 AM

## 2018-10-30 ENCOUNTER — Encounter: Payer: Self-pay | Admitting: Hematology

## 2018-10-30 ENCOUNTER — Inpatient Hospital Stay (HOSPITAL_BASED_OUTPATIENT_CLINIC_OR_DEPARTMENT_OTHER): Payer: Medicare Other | Admitting: Hematology

## 2018-10-30 ENCOUNTER — Other Ambulatory Visit: Payer: Self-pay | Admitting: Nurse Practitioner

## 2018-10-30 ENCOUNTER — Ambulatory Visit (HOSPITAL_COMMUNITY)
Admission: RE | Admit: 2018-10-30 | Discharge: 2018-10-30 | Disposition: A | Discharge status: Home / Self Care | Payer: Medicare Other | Source: Ambulatory Visit | Attending: Nurse Practitioner | Admitting: Nurse Practitioner

## 2018-10-30 ENCOUNTER — Telehealth: Payer: Self-pay | Admitting: Hematology

## 2018-10-30 ENCOUNTER — Inpatient Hospital Stay: Payer: Medicare Other

## 2018-10-30 ENCOUNTER — Other Ambulatory Visit: Payer: Self-pay

## 2018-10-30 DIAGNOSIS — D649 Anemia, unspecified: Secondary | ICD-10-CM | POA: Diagnosis not present

## 2018-10-30 DIAGNOSIS — I614 Nontraumatic intracerebral hemorrhage in cerebellum: Secondary | ICD-10-CM | POA: Diagnosis not present

## 2018-10-30 DIAGNOSIS — J449 Chronic obstructive pulmonary disease, unspecified: Secondary | ICD-10-CM | POA: Diagnosis not present

## 2018-10-30 DIAGNOSIS — R198 Other specified symptoms and signs involving the digestive system and abdomen: Secondary | ICD-10-CM

## 2018-10-30 DIAGNOSIS — R140 Abdominal distension (gaseous): Secondary | ICD-10-CM | POA: Diagnosis not present

## 2018-10-30 DIAGNOSIS — R0602 Shortness of breath: Secondary | ICD-10-CM | POA: Diagnosis not present

## 2018-10-30 DIAGNOSIS — C7931 Secondary malignant neoplasm of brain: Secondary | ICD-10-CM | POA: Diagnosis not present

## 2018-10-30 DIAGNOSIS — C159 Malignant neoplasm of esophagus, unspecified: Secondary | ICD-10-CM

## 2018-10-30 DIAGNOSIS — R609 Edema, unspecified: Secondary | ICD-10-CM | POA: Diagnosis not present

## 2018-10-30 MED ORDER — LIDOCAINE HCL 1 % IJ SOLN
INTRAMUSCULAR | Status: AC
  Filled 2018-10-30: qty 10

## 2018-10-30 NOTE — Telephone Encounter (Signed)
Scheduled apt per 8/17 sch message - pt aware of schedule change.

## 2018-10-30 NOTE — Progress Notes (Signed)
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   01/30/2018 Initial Diagnosis   Metastasis to brain [facility])   01/31/2018 Imaging   MRI Brain W WO Contrast 01/31/18  IMPRESSION: 3 Tesla study does not disclose any additional lesions. Solitary metastasis in the right superior cerebellum measuring 2.9 x 2.6 x 3.0 cm with mild surrounding edema. 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   02/03/2018 - 02/08/2018 Radiation Therapy   SBRT with Dr. Isidore Moos on 02/03/18, 02/06/18, 02/08/18   06/12/2018 - 09/04/2018 Chemotherapy   FOLFOX with leucovorin q2weeks    09/12/2018 Imaging   MRI Brain  IMPRESSION: Right cerebellar metastatic deposit unchanged in size. Improvement in surrounding edema. No new enhancing lesions identified.   Negative for hydrocephalus.   Esophageal cancer, stage IV ([place])  01/08/2018 Imaging   CT AP W Constrast 01/08/18  IMPRESSION: 1. Nonobstructed, nondistended bowel. No acute inflammatory process. 2. Moderate-sized hiatal hernia noted. 3. Low-density 1.7 cm left adrenal nodule likely to represent a small adenoma. Lower pole left renal cyst measuring 1.8 cm. 4. Moderate aortic  atherosclerosis. 5. Lumbar spondylosis and facet arthropathy.   01/27/2018 Imaging   CT Chest W Contrast 01/27/18  IMPRESSION: 1. Large mass involving the distal half of the esophagus spanning approximately 11.7 cm is identified compatible with primary esophageal neoplasm. 2. Small posterior mediastinal and high right paratracheal lymph nodes are noted which may represent foci of metastatic adenopathy. 3. The small pulmonary nodules in the right lung are unchanged from 01/15/2016 and are favored to represent a benign process. 4. Aortic Atherosclerosis (ICD10-I70.0) and Emphysema (ICD10-J43.9). 5. Coronary artery atherosclerotic calcifications.   01/30/2018 Initial Diagnosis   Esophageal cancer, stage IV ([place])   02/03/2018 - 02/08/2018 Radiation Therapy   SBRT to oligo brain metastasis, in 3 fractions, under Dr. Isidore Moos    02/16/2018 PET scan   PET 02/16/18 IMPRESSION: 1. A 15.7 cm in length esophageal mass extends from the aortic arch level down to the distal esophagus, maximum SUV 22.4, compatible with malignancy. There is a small right lower neck level IV lymph node with lower than mediastinal blood pool activity, and a right upper paratracheal lymph node with just above mediastinal blood pool activity. 2. Faint speckled heterogeneity in the liver, for example in the lateral segment left hepatic lobe, is likely incidental/benign given the lack of correlate of CT finding. It might be prudent to obtain an MRI just to make sure that there is not a small early metastatic lesion in this vicinity. 3. Known right cerebellar vermian metastatic lesion, with expected hypo activity compared to the surrounding brain activity. 4. The right middle lobe pulmonary nodule measures 1.1 by 1.0 cm and is mildly hypermetabolic with maximum SUV 2.6. This lesion measured 6 by 5 mm back on  12/06/2003 and 1.2 by 1.1 cm on 10/15/2015. I am uncertain what to make of the very slow growth and  mildly accentuated metabolic activity. This could be a chronic granulomatous process. Low-grade adenocarcinoma of the lung seems unlikely to be so indolent as to only mildly increase over a 14 year period, but is presumably a differential diagnostic consideration. Surveillance is suggested. 5. Other imaging findings of potential clinical significance: Aortic Atherosclerosis (ICD10-I70.0). Descending and sigmoid colon diverticulosis. Gas in the urinary bladder, most common cause would be recent catheterization. Minimal chronic left ethmoid sinusitis.   02/27/2018 - 04/09/2018 Chemotherapy   Concurrent chemoRT with weekly carboplatin and Taxol 02/27/2018-04/03/18   02/27/2018 - 04/07/2018 Radiation Therapy   Concurrent ChemoRT 02/27/18-04/07/18   06/12/2018 - 09/04/2018 Chemotherapy   FOLFOX with leucovorin q2weeks    08/31/2018 PET scan      PET 08/30/18 IMPRESSION: 1. Decrease in size and metabolic activity of distal esophageal primary carcinoma. 2. No evidence metastatic adenopathy in the chest or abdomen. 3. Lesion in the cerebellum again noted. 4. Stable pulmonary nodule RIGHT middle lobe is not related to esophageal carcinoma as present in 2005 (smaller then). Favor indolent neoplasm such as carcinoid or hamartoma.   09/12/2018 Imaging   MRI Brain  IMPRESSION: Right cerebellar metastatic deposit unchanged in size. Improvement in surrounding edema. No new enhancing lesions identified.   Negative for hydrocephalus.   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 Ultrasound-guided biopsy is recommended.   01/10/2018 Initial Biopsy   Diagnsis from Breast Biopsy 01/10/18  Breast, right, needle core biopsy, 6  o'clock - INVASIVE DUCTAL CARCINOMA WITH CALCIFICATIONS, SEE COMMENT. - DUCTAL CARCINOMA IN SITU. 1 of 2 FINAL for [name], [name] [phone]) Microscopic Comment The carcinoma appears grade 2. Prognostic markers will be ordered. Dr. [name] has reviewed the case. The case was called to [facility] on 01/11/2018.   01/10/2018 Receptors her2   The tumor cells are NEGATIVE for Her2 (1+). Estrogen Receptor: 100%, POSITIVE, STRONG STAINING INTENSITY Progesterone Receptor: 100%, POSITIVE, STRONG STAINING INTENSITY Proliferation Marker Ki67: 15%   01/10/2018 Cancer Staging   Staging form: Breast, AJCC 8th Edition - Clinical stage from 01/10/2018: Stage IA (cT1b, cN0, cM0, G2, ER+, PR+, HER2-) - Signed by [name], MD on 02/03/2018   01/30/2018 Initial Diagnosis   Breast cancer ([place])   05/07/2018 -  Anti-estrogen oral therapy   Anastrozole '1mg'$  daily starting 05/07/2018      CURRENT THERAPY:  Anastrozole '1mg'$  daily started2/23/2020. Due to rash, switched to Letrozole 2.'5mg'$  on 06/2718.   INTERVAL HISTORY:  Patient was seen by my nurse practitioner Bella Kennedy last week, due to worsening abdominal distention, paracentesis was scheduled for today.  Ultrasound showed no significant ascites and procedure was canceled.  She was very frustrated, came to our cancer center, and I saw her as she requested.  Overall she has been very fatigued, not able to do much activity at home.  She uses walker to ambulate, her daughter is very concerned about her risk of fall.  She complains of abdominal bloating, leg swelling, which has been persistent, she also reports some left-sided abdominal pain, 5 out of 10, it has been persistent in the past few days.  Her bowel movement is normal,  no constipation or hematochezia.  New or chills.    REVIEW OF SYSTEMS:   Constitutional: Denies fevers, chills or abnormal weight loss Eyes: Denies blurriness of vision Ears, nose, mouth, throat, and face:  Denies mucositis or sore throat (+) intermittent dysphagia  Respiratory: Denies dyspnea or wheezes, cough better this week Cardiovascular: Denies palpitation, chest discomfort worsening lower extremity swelling Gastrointestinal:  Denies heartburn or change in bowel habits (+) Nausea, controlled  Lymphatics: Denies new lymphadenopathy (+) mild bruise on her arms Neurological:Denies numbness, tingling, (+) significant overall weakness, especially in her legs Behavioral/Psych: Mood is stable, no new changes  All other systems were reviewed with the patient and are negative.  MEDICAL HISTORY:  Past Medical History:  Diagnosis Date   Arthritis    Cancer ([place])    Cervical   Cancer ([place])    Breast   Cancer ([place])    Vaginal   Complication of anesthesia    COPD (chronic obstructive pulmonary disease) ([place])    Headache(784.0)    History of radiation therapy 02/28/19- 04/07/18   Esophagus, 1.8 Gy in 28 fractions for a total dose of 50.4 Gy.    History of radiation therapy 02/03/18- 02/08/18   Brain Rt Sup Cerebellum// 6 FFF photons.    Hyperlipidemia    Leg pain    Peripheral vascular disease ([place])    Personal history of chemotherapy    Personal history of radiation therapy    PONV (postoperative nausea and vomiting)     SURGICAL HISTORY: Past Surgical History:  Procedure Laterality Date   ABDOMINAL HYSTERECTOMY  1975   BREAST LUMPECTOMY  2005   GANGLION CYST EXCISION  1977   ILIAC ARTERY STENT  04/20/2004   CSD right iliac occlusive disease   IR IMAGING GUIDED PORT INSERTION  02/22/2018    I have reviewed the social history and family history with the patient and they are unchanged from previous note.  ALLERGIES:  is allergic to flonase [fluticasone propionate].  MEDICATIONS:  Current Outpatient Medications  Medication Sig Dispense Refill   albuterol (PROVENTIL HFA;VENTOLIN HFA) 108 (90 Base) MCG/ACT inhaler Inhale 2 puffs into the lungs every 6 (six)  hours as needed for wheezing or shortness of breath.     atorvastatin (LIPITOR) 20 MG tablet Take 20 mg by mouth daily.     budesonide-formoterol (SYMBICORT) 160-4.5 MCG/ACT inhaler Inhale 1 puff into the lungs 2 (two) times daily.     cyclobenzaprine (FLEXERIL) 5 MG tablet Take 1 tablet (5 mg total) by mouth 3 (three) times daily as needed for muscle spasms. 270 tablet 0   dexamethasone (DECADRON) 1 MG tablet Take 73m daily in the AM (Patient not taking: Reported on 10/26/2018) 60 tablet 1   furosemide (LASIX) 20 MG tablet Take 2 tablets (40 mg total) by mouth daily. Take additional 24mpm as needed 90 tablet 0   HYDROcodone-acetaminophen (NORCO) 7.5-325 MG tablet Take 1 tablet by mouth every 6 (six) hours as needed for moderate pain. 45 tablet 0   HYDROcodone-homatropine (HYCODAN) 5-1.5 MG/5ML syrup Take 5 mLs by mouth every 6 (six) hours as needed for cough. (Patient not taking: Reported on 10/26/2018) 240 mL 0   hydrocortisone (CORTEF) 10 MG tablet Take 2047mn AM, 72m28m PM x7 days; then 72mg70mAM, 72mg 16mM x7 days, then 72mg d29m x7 days 42 tablet 0   hydrocortisone (CORTEF) 20 MG tablet TAKE 1 TABLET IN THE MORNING AND 1 2 (ONE HALF) TAB BY MOUTH IN THE EVENING  FOR 7 DAYS THEN 1 2 (ONE HALF) TAB IN THE MORNING AND 1 2 (ONE H     letrozole (FEMARA) 2.5 MG tablet Take 1 tablet by mouth once daily 30 tablet 0   lidocaine (XYLOCAINE) 2 % solution MIX 1 PART LIDOCAINE, 1 PART H20 SWALLOW 10ML OF DILUTED MIXTURE, 20 MINUTES BEFORE MEALS AND AT BEDTIME UP TO 3 TIMES DAILY. 300 mL 0   lidocaine-prilocaine (EMLA) cream Apply 1 application topically as needed. (Patient taking differently: Apply 1 application topically as needed (port). ) 30 g 1   LORazepam (ATIVAN) 1 MG tablet Take 0.5 tablets (0.5 mg total) by mouth 3 (three) times daily as needed for anxiety or sleep. 45 tablet 0   magic mouthwash SOLN Take 5 mLs by mouth 4 (four) times daily. 60 mL 0   metoCLOPramide (REGLAN) 10 MG  tablet Take 1 tablet (10 mg total) by mouth every 6 (six) hours as needed. for nausea 180 tablet 1   omeprazole (PRILOSEC) 40 MG capsule Take 40 mg by mouth daily.     ondansetron (ZOFRAN) 8 MG tablet Take 1 tablet (8 mg total) by mouth every 8 (eight) hours as needed for nausea or vomiting. (Patient not taking: Reported on 10/26/2018) 20 tablet 0   potassium chloride SA (K-DUR) 20 MEQ tablet Take 1 tablet (20 mEq total) by mouth daily. 30 tablet 0   sucralfate (CARAFATE) 1 g tablet DISSOLVE ONE TABLET IN 10 MLS OF WATER AND SWALLOW UP TO FOUR TIMES DAILY TO SOOTHE THROAT (Patient taking differently: Take 1 g by mouth See admin instructions. DISSOLVE ONE TABLET IN 10 MLS OF WATER AND SWALLOW UP TO FOUR TIMES DAILY TO SOOTHE THROAT) 40 tablet 2   traZODone (DESYREL) 50 MG tablet Take 1 tablet (50 mg total) by mouth at bedtime. (Patient not taking: Reported on 10/26/2018) 30 tablet 1   warfarin (COUMADIN) 5 MG tablet Take 0.5-1 tablets (2.5-5 mg total) by mouth See admin instructions. 2.5 MG on Monday, Wednesday and Saturday 5 Mg on Sunday, Tuesday, Thursday and Friday 30 tablet 2   No current facility-administered medications for this visit.     PHYSICAL EXAMINATION: ECOG PERFORMANCE STATUS: 3 - Symptomatic, >50% confined to bed GENERAL:alert, no distress and comfortable, came in a wheelchair  SKIN: skin color, texture, turgor are normal (+) a few small ecchymosis on bilateral arms EYES: normal, Conjunctiva are pink and non-injected, sclera clear  NECK: supple, thyroid normal size, non-tender, without nodularity LYMPH:  no palpable lymphadenopathy in the cervical, axillary  LUNGS: clear to auscultation and percussion with normal breathing effort HEART: regular rate & rhythm and no murmurs  (+) B/l  lower extremity pitting edema up to knee  ABDOMEN:abdomen soft, non-tender and normal bowel sounds Musculoskeletal:no cyanosis of digits and no clubbing  NEURO: alert & oriented x 3 with fluent  speech, no focal motor/sensory deficits  LABORATORY DATA:  I have reviewed the data as listed CBC Latest Ref Rng & Units 10/25/2018 10/17/2018 10/10/2018  WBC 4.0 - 10.5 K/uL 7.3 7.2 5.3  Hemoglobin 12.0 - 15.0 g/dL 9.6(L) 9.7(L) 8.2(L)  Hematocrit 36.0 - 46.0 % 31.6(L) 32.1(L) 25.4(L)  Platelets 150 - 400 K/uL 234 252 171     CMP Latest Ref Rng & Units 10/25/2018 10/17/2018 10/10/2018  Glucose 70 - 99 mg/dL 100(H) 110(H) 108(H)  BUN 8 - 23 mg/dL 12 16 27(H)  Creatinine 0.44 - 1.00 mg/dL 0.86 0.87 0.90  Sodium 135 - 145 mmol/L 137 136 129(L)  Potassium  3.5 - 5.1 mmol/L 3.1(L) 4.2 3.2(L)  Chloride 98 - 111 mmol/L 98 99 93(L)  CO2 22 - 32 mmol/L _0 Calcium 8.9 - 10.3 mg/dL 9.0 9.0 8.5(L)  Total Protein 6.5 - 8.1 g/dL 5.9(L) 6.1(L) 5.5(L)  Total Bilirubin 0.3 - 1.2 mg/dL 0.5 0.5 0.7  Alkaline Phos 38 - 126 U/L 172(H) 246(H) 274(H)  AST 15 - 41 U/L _1 ALT 0 - 44 U/L _2 RADIOGRAPHIC STUDIES: I have personally reviewed the radiological images as listed and agreed with the findings in the report. US Abdomen Limited  Result Date: 10/30/2018 CLINICAL DATA:  History cervical, vaginal and breast cancer, now with abdominal distention. Please perform ascites search ultrasound and ultrasound-guided paracentesis as indicated. EXAM: LIMITED ABDOMEN ULTRASOUND FOR ASCITES TECHNIQUE: Limited ultrasound survey for ascites was performed in all four abdominal quadrants. COMPARISON:  PET-CT-08/30/2018 FINDINGS: Sonographic evaluation of the 4 quadrants of the abdomen performed by the dictating interventional radiologist failed to delineate any significant intra-abdominal ascites. As such, ultrasound-guided paracentesis was not attempted. IMPRESSION: No sonographic evidence of significant intra-abdominal ascites. No paracentesis attempted. Electronically Signed   By: Sandi Mariscal M.D.   On: 10/30/2018 13:43     ASSESSMENT & PLAN:  Kristin Williams is a 73 y.o. female with   1. General  weakness, abdominal pain,distention and LE swelling.  -Possible related to fluid retention secondary to hypoalbuminemia, long-term steroids and underline cancer  -Pt is very frustrated about her symptoms -I encouraged her to continue Lasix, compression stocks, leg elevation, and eat more protein  -she has completed outpt PT, I encourage her to consider inpt rehab, but she declined. Pt's daughter Maudie Mercury will encourage her to do exercise at home -she is being weaned off steroid, last dose tomorrow   2.EsophagealAdenocarcinoma,with oligo met incerebellum, stage IV, HER2(-), MSS, and PD-L1(-) -She was diagnosed with esophogeal cancerin 01/2018,with oligometastasisto the brain. Given her stage IV disease her cancer is not curable but still treatable. Goal of care it to control her disease.  -She completedSBRTto her brain meton11/27/2019and completed chemoRTto primary tumoron 04/07/18.She recovered well. -Her molecular testing on the tumor showed MSI stable disease, PD-L1 negative, she is not a candidate for immunotherapy. Her2was also negative,not a candidate for Her2 antibody  -She completed consolidation chemotherapytreatment with FOLFOX q2weeksfor 3 months.  -We discussed PET scan from 08/30/18 which shows decrease in size and metabolic activity of distal esophageal primary carcinoma and no evidence metastatic adenopathy in the chest or abdomen. She was pleased with the result  -We discussed that her esophageal cancer is not cured.  Due to her recent newly onset left abdominal pain, I recommend her to have a CT scan for evaluation, she declined at this point -f/u in 2-3 weeks   3. mid/low back pain -MRI Thoracic and lumbar spine showed compression fracture atT3, T8, and T11 appear benign. -She was seen by neurosurgeon Dr. Sherwood Gambler who suspect her pain is not mainly from her compression fractures -her PET scan is negative for bone mets, I think her back pain is likely related  to her compression fractures -Back pain manageable with Hydrocodone q6hours and flexeril, overall improved, will continue monitoring   4. RightBreastInvasive Ductal Carcinoma,cT1bN0M0 stage IA,Grade II, ER/PR Positive, HER 2 negative -Diagnosed through screening mammogram, same time as her esophageal cancer in October 2019.  -Given her early stage breast cancer, I previously discussedher breast cancer treatment is less urgent than esophogeal cancertreatment. -Given  her EP/PR breast cancer and post-menopausal status,she startedantiestrogen therapy with anastrozoleon 05/07/18, and tolerating wellexcept recent tiny bruising of her breasts and abdomen. Will monitor. -Her breasts surgery has been held due to her metastatic esophageal cancer. -she is tolerating letrozole well, will continue indefinitely   5.Oligo brain mets, ataxia, Insomnia -This is likely from her primaryesophogealcancer.  -She does have balance issues and ambulates with assistance.  -She is s/p SBRT. Hermild slurred speech, right sided weaknessand balance stabilityhas muchimproved -Will monitor with brain scan every 3 months. -Dexa exacerbated her insomnia even with ativan to 1.5 tabs. Ipreviouslycalled in Trazodone 82m nightly as needed (06/12/18) -Herslurred speech continues to progress.She continues to exercise and gaining strength of right side. She continues to practice walking with cane although still unsteady, and with walker. Shehas progressing uncontrolled function of her right hand. Her memory is improving.  -Dr. VMickeal Skinnerhas her on tapering dose hydrocortisone now, last dose tomorrow  -MRI brain from 09/12/18 shows Right cerebellar metastatic deposit is stable. Improvement of surrounding edema. No new enhancing lesions identified. Negative for hydrocephalus.   6. H/o Left breast cancer, ER/PR negative, treated by Dr. MJana Hakimwith lumpectomy, adj, chemo and radiation.  7.Acute deep vein  thrombosis (DVT) of popliteal vein of left lower extremity, diagnosed on 02/28/2018  -She was found to have left lower extremity edema on his first day of chemoradiation, Doppler showed DVT -She iscurrentlyon coumadin 54mdailyexcept 2.14m67mnMonday,Wednesday, Saturday.  8. Residual Neuropathy, G1-2  9.COPD with chronic cough -Shehas chronic cough -She presented to ED for COPD excerebration on 04/12/18. She has recovered and her breathing is improved. -On symbicort inhaler.  -She notes her cough has progressed with yellow-green phlegm. The aggressive coughing has been effecting her back pain as she tries to cough something up.  -I recommend Mucinex to help her release phlegm.  -If she develops fever or worsening cough, I will call in antibiotics.     PLAN: -continue supportive care -she declined inpt rehab, but agree to exercise at home  -RTC in 2-3 weeks with lab and flush    No problem-specific Assessment & Plan notes found for this encounter.   No orders of the defined types were placed in this encounter.  All questions were answered. The patient knows to call the clinic with any problems, questions or concerns. No barriers to learning was detected. I spent 20 minutes counseling the patient face to face. The total time spent in the appointment was 25 minutes and more than 50% was on counseling and review of test results     YanTruitt MerleD 10/30/2018   I, AmoJoslyn Devonm acting as scribe for YanTruitt MerleD.   I have reviewed the above documentation for accuracy and completeness, and I agree with the above.

## 2018-10-30 NOTE — Progress Notes (Signed)
Patient complaining of abdominal distention presents to [facility] radiology for paracentesis today.  Limited ultrasound abdomen shows no fluid; results dictated separately. No procedure performed today.  Brynda Greathouse, MS RD PA-C 12:04 PM

## 2018-10-31 ENCOUNTER — Telehealth (HOSPITAL_COMMUNITY): Payer: Self-pay | Admitting: Rehabilitation

## 2018-10-31 NOTE — Telephone Encounter (Signed)

## 2018-11-01 ENCOUNTER — Telehealth: Payer: Self-pay

## 2018-11-01 ENCOUNTER — Telehealth: Payer: Self-pay | Admitting: Hematology

## 2018-11-01 ENCOUNTER — Other Ambulatory Visit: Payer: Self-pay | Admitting: *Deleted

## 2018-11-01 ENCOUNTER — Other Ambulatory Visit: Payer: Self-pay | Admitting: Nurse Practitioner

## 2018-11-01 ENCOUNTER — Inpatient Hospital Stay: Payer: Medicare Other | Admitting: Nutrition

## 2018-11-01 ENCOUNTER — Encounter: Payer: Self-pay | Admitting: *Deleted

## 2018-11-01 ENCOUNTER — Other Ambulatory Visit: Payer: Self-pay

## 2018-11-01 ENCOUNTER — Encounter: Payer: Self-pay | Admitting: Vascular Surgery

## 2018-11-01 ENCOUNTER — Ambulatory Visit (INDEPENDENT_AMBULATORY_CARE_PROVIDER_SITE_OTHER): Payer: Medicare Other | Admitting: Vascular Surgery

## 2018-11-01 VITALS — BP 96/64 | HR 102 | Temp 97.8°F | Resp 14 | Ht 60.0 in | Wt 180.0 lb

## 2018-11-01 DIAGNOSIS — I739 Peripheral vascular disease, unspecified: Secondary | ICD-10-CM | POA: Diagnosis not present

## 2018-11-01 DIAGNOSIS — M545 Low back pain, unspecified: Secondary | ICD-10-CM

## 2018-11-01 MED ORDER — HYDROCODONE-ACETAMINOPHEN 7.5-325 MG PO TABS: 1.0000 | ORAL_TABLET | Freq: Four times a day (QID) | ORAL | 0 refills | Status: DC | PRN

## 2018-11-01 NOTE — Telephone Encounter (Signed)
No los per 8/17.

## 2018-11-01 NOTE — Progress Notes (Signed)
Nutrition follow-up completed with patient and daughter over the phone secondary to esophageal cancer and breast cancer.  Past medical history includes PVD, hyperlipidemia, chemo and radiation therapy, COPD, and arthritis.  Medications include Lipitor, Decadron, Lasix, Femara, lidocaine, Ativan, Magic mouthwash, Reglan, Prilosec, Zofran, Carafate, K-Dur, and Coumadin.  Labs include hemoglobin 9.6, glucose 100, potassium 3.1,  Height: 5 feet 0 inches. Weight: 180 pounds August 19. BMI: 35.15.  Patient continues to have taste alterations which limit her desire to increase her oral intake. Her daughter reports patient will eat small amounts of chicken nuggets, white chicken chili, cheese, and tuna or chicken salad. She is drinking approximately 4-20 ounce bottles of water every day. She continues to be very tired and weak.  Nutrition diagnosis: Unintentional weight loss continues.  Intervention: Provided strategies for taste alterations.  Recommended patient continue small frequent meals and snacks with high-protein foods. Provided some additional protein sources/recommendations. Email fact sheets to patient's daughter. Encouraged continued fluid intake. Recommended high potassium foods.  Monitoring, evaluation, goals: Patient will tolerate increased calories protein and potassium.  Next visit: Patient/family to contact me with further questions.

## 2018-11-01 NOTE — Progress Notes (Signed)
Patient name: Kristin Williams MRN: [id number] DOB: [birth year] Sex: female  REASON FOR VISIT:   Rest pain right foot with peripheral vascular disease  HPI:   Kristin Williams is a pleasant 73 y.o. female who was seen by the nurse practitioner on 10/26/2018 I had performed right iliac stent placement in February 2006.  Before that intervention she had just undergone chemotherapy for breast cancer.  She was subsequently lost to follow-up.   When she saw the nurse practitioner she was complaining of pain in the right great toe and right second toe.  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  She sees a Child psychotherapist at [facility] for her brain cancer.  At that time she had dependent edema in both legs that she spends a lot of time sitting up in her motorized wheelchair.  Noninvasive studies at the time of her last visit showed that on the right side, the side of concern, there was a monophasic dorsalis pedis and posterior tibial signal with an ABI of 72%.  This was likely falsely elevated because of her diabetes.  Toe pressure was 0.  On the left side she had a triphasic dorsalis pedis and posterior tibial signal with an ABI of 100%.  Toe pressure was 72 mmHg.  The patient has developed progressive rest pain of the right foot.  She has to hang her foot down at night because of the pain.  Her activity is very limited and she is barely able to ambulate.  She spends most of her time in a wheelchair.  She does not remember any specific injury to the right foot.   Past Medical History:  Diagnosis Date  . Arthritis   . Cancer (HCC)    Cervical  . Cancer ([place])    Breast  . Cancer ([place])    Vaginal  . Complication of anesthesia   . COPD (chronic obstructive pulmonary disease) ([place])   . Headache(784.0)   . History of radiation therapy 02/28/19- 04/07/18   Esophagus, 1.8 Gy in 28 fractions for a total dose of 50.4 Gy.   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   . Hyperlipidemia   . Leg pain   . Peripheral vascular disease ([place])   . Personal history of chemotherapy   . Personal history of radiation therapy   . PONV (postoperative nausea and vomiting)     Family History  Problem Relation Age of Onset  . Cancer Mother 68       Non-Hodgkins Disease T-Cell  . Heart failure Father   . COPD Father   . Heart disease Brother 62       MI and Heart Disease before age 30  . Heart attack Brother   . Lung cancer Maternal Uncle   . Cancer Cousin   . Prostate cancer Maternal Uncle   . Prostate cancer Brother        half brother  . Breast cancer Neg Hx     SOCIAL HISTORY: Social History   Tobacco Use  . Smoking status: Former Smoker    Packs/day: 0.50    Years: 40.00    Pack years: 20.00    Types: Cigarettes    Quit date: 01/27/2018    Years since quitting: 0.7  . Smokeless tobacco: Never Used  . Tobacco comment: She admits to smoking a few puffs daily- 05/02/18  Substance Use Topics  . Alcohol use: No    Allergies  Allergen Reactions  . Flonase [Fluticasone Propionate] Other (See Comments)    Nose bleeds    Current Outpatient Medications  Medication Sig Dispense Refill  . albuterol (PROVENTIL HFA;VENTOLIN HFA) 108 (90 Base) MCG/ACT inhaler Inhale 2 puffs into the lungs every 6 (six) hours as needed for wheezing or shortness of breath.    Marland Kitchen atorvastatin (LIPITOR) 20 MG tablet Take 20 mg by mouth daily.    . budesonide-formoterol (SYMBICORT) 160-4.5 MCG/ACT inhaler Inhale 1 puff into the lungs 2 (two) times daily.    . cyclobenzaprine (FLEXERIL) 5 MG tablet Take 1 tablet (5 mg total) by mouth 3 (three) times daily as needed for muscle spasms. 270 tablet 0  . dexamethasone (DECADRON) 1 MG tablet Take 3mg  daily in the AM 60 tablet 1  . furosemide (LASIX) 20 MG tablet Take 2 tablets (40 mg total) by mouth daily. Take additional 20mg  pm as needed 90 tablet 0   . HYDROcodone-acetaminophen (NORCO) 7.5-325 MG tablet Take 1 tablet by mouth every 6 (six) hours as needed for moderate pain. 45 tablet 0  . HYDROcodone-homatropine (HYCODAN) 5-1.5 MG/5ML syrup Take 5 mLs by mouth every 6 (six) hours as needed for cough. 240 mL 0  . letrozole (FEMARA) 2.5 MG tablet Take 1 tablet by mouth once daily 30 tablet 0  . lidocaine (XYLOCAINE) 2 % solution MIX 1 PART LIDOCAINE, 1 PART H20 SWALLOW 10ML OF DILUTED MIXTURE, 20 MINUTES BEFORE MEALS AND AT BEDTIME UP TO 3 TIMES DAILY. 300 mL 0  . lidocaine-prilocaine (EMLA) cream Apply 1 application topically as needed. (Patient taking differently: Apply 1 application topically as needed (port). ) 30 g 1  . LORazepam (ATIVAN) 1 MG tablet Take 0.5 tablets (0.5 mg total) by mouth 3 (three) times daily as needed for anxiety or sleep. 45 tablet 0  . magic mouthwash SOLN Take 5 mLs by mouth 4 (four) times daily. 60 mL 0  . metoCLOPramide (REGLAN) 10 MG tablet Take 1 tablet (10 mg total) by mouth every 6 (six) hours as needed. for nausea 180 tablet 1  . omeprazole (PRILOSEC) 40 MG capsule Take 40 mg by mouth daily.    . ondansetron (ZOFRAN) 8 MG tablet Take 1 tablet (8 mg total) by mouth every 8 (eight) hours as needed for nausea or vomiting. 20 tablet 0  . potassium chloride SA (K-DUR) 20 MEQ tablet Take 1 tablet (20 mEq total) by mouth daily. 30 tablet 0  . sucralfate (CARAFATE) 1 g tablet DISSOLVE ONE TABLET IN 10 MLS OF WATER AND SWALLOW UP TO FOUR TIMES DAILY TO SOOTHE THROAT (Patient taking differently: Take 1 g by mouth See admin instructions. DISSOLVE ONE TABLET IN 10 MLS OF WATER AND SWALLOW UP TO FOUR TIMES DAILY TO SOOTHE THROAT) 40 tablet 2  . traZODone (DESYREL) 50 MG tablet Take 1 tablet (50 mg total) by mouth at bedtime. 30 tablet 1  . warfarin (COUMADIN) 5 MG tablet Take 0.5-1 tablets (2.5-5 mg total) by mouth See admin instructions. 2.5 MG on Monday, Wednesday and Saturday 5 Mg on Sunday, Tuesday, Thursday and Friday 30  tablet 2   No current facility-administered medications for this visit.     REVIEW OF SYSTEMS:  [X]  denotes positive finding, [ ]  denotes negative finding Cardiac  Comments:  Chest pain or chest pressure:    Shortness of breath upon exertion:    Short of breath when lying flat:  Irregular heart rhythm:        Vascular    Pain in calf, thigh, or hip brought on by ambulation:    Pain in feet at night that wakes you up from your sleep:  x   Blood clot in your veins:    Leg swelling:         Pulmonary    Oxygen at home:    Productive cough:     Wheezing:         Neurologic    Sudden weakness in arms or legs:     Sudden numbness in arms or legs:     Sudden onset of difficulty speaking or slurred speech:    Temporary loss of vision in one eye:     Problems with dizziness:         Gastrointestinal    Blood in stool:     Vomited blood:         Genitourinary    Burning when urinating:     Blood in urine:        Psychiatric    Major depression:         Hematologic    Bleeding problems:    Problems with blood clotting too easily:        Skin    Rashes or ulcers:        Constitutional    Fever or chills:     PHYSICAL EXAM:   Vitals:   11/01/18 0858  BP: 96/64  Pulse: (!) 102  Resp: 14  Temp: 97.8 F (36.6 C)  TempSrc: Temporal  SpO2: 97%  Weight: 180 lb (81.6 kg)  Height: 5' (1.524 m)    GENERAL: The patient is a well-nourished female, in no acute distress. The vital signs are documented above. CARDIAC: There is a regular rate and rhythm.  VASCULAR: I do not detect carotid bruits. I was able to palpate femoral pulses. I cannot palpate pedal pulses. PULMONARY: There is good air exchange bilaterally without wheezing or rales. ABDOMEN: Soft and non-tender with normal pitched bowel sounds.  MUSCULOSKELETAL: There are no major deformities or cyanosis. NEUROLOGIC: No focal weakness or paresthesias are detected. SKIN: She has ischemic changes to the first  and second toes on the right foot.    PSYCHIATRIC: The patient has a normal affect.  DATA:    LABS: I reviewed her labs from 10/25/2018.  Creatinine was 0.86.  GFR was greater than 60.  ARTERIAL DOPPLER STUDY: I reviewed her arterial Doppler study was done at her last visit.  On the right side she had a monophasic dorsalis pedis and posterior tibial signal with an ABI of 72%.  This was likely falsely elevated secondary to calcific disease.  Her toe pressure was 0.  On the left side she had a triphasic dorsalis pedis and posterior tibial signal.  ABI was 100%.  Toe pressure was 72 mmHg.  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  I have explained that this is a limb threatening problem.  For this reason I recommended arteriography. I have reviewed with the patient the indications for arteriography. In addition, I have reviewed the potential complications of arteriography including but not limited to: Bleeding, arterial injury, arterial thrombosis, dye action, renal insufficiency, or other unpredictable medical problems. I have explained to the patient that if we find disease amenable  to angioplasty we could potentially address this at the same time. I have discussed the potential complications of angioplasty and stenting, including but not limited to: Bleeding, arterial thrombosis, arterial injury, dissection, or the need for surgical intervention.  The situation is further complicated by the fact that she has a difficult time laying flat.  I have explained to her and her daughter that we can generally try to position the patient to be as comfortable as possible although this does make the procedure technically more challenging.  We would have the same issue if we try to get a CT angiogram which I do not think would give adequate results  and would not allow [place] the option of an endovascular approach which would be ideal in her situation.  We will just have to do the best that we can.  We will hold her Coumadin for 4 days prior to the procedure and restart this immediately after the procedure.  [name] [facility] [phone]

## 2018-11-01 NOTE — Telephone Encounter (Signed)
Received call from [facility] with Dr. Nicole Cella office Vascular and Vein regarding patient stopping the Coumadin 8/23 prior to aorta gram on 8/28.  Spoke with Dr. Burr Medico and is states it is fine for the patient to stop it and does not requiring bridging with Lovenox.  I called her back to let her know and also spoke with the patient's daughter about this.    Also patient needs a refill on Hydrocodone tablets (last filled 8/5 #45) this was passed onto Cira Rue NP

## 2018-11-01 NOTE — H&P (View-Only) (Signed)
Patient name: Kristin Williams MRN: [id number] DOB: [birth year] Sex: female  REASON FOR VISIT:   Rest pain right foot with peripheral vascular disease  HPI:   Kristin Williams is a pleasant 73 y.o. female who was seen by the nurse practitioner on 10/26/2018 I had performed right iliac stent placement in February 2006.  Before that intervention she had just undergone chemotherapy for breast cancer.  She was subsequently lost to follow-up.   When she saw the nurse practitioner she was complaining of pain in the right great toe and right second toe.  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  She sees a Child psychotherapist at [facility] for her brain cancer.  At that time she had dependent edema in both legs that she spends a lot of time sitting up in her motorized wheelchair.  Noninvasive studies at the time of her last visit showed that on the right side, the side of concern, there was a monophasic dorsalis pedis and posterior tibial signal with an ABI of 72%.  This was likely falsely elevated because of her diabetes.  Toe pressure was 0.  On the left side she had a triphasic dorsalis pedis and posterior tibial signal with an ABI of 100%.  Toe pressure was 72 mmHg.  The patient has developed progressive rest pain of the right foot.  She has to hang her foot down at night because of the pain.  Her activity is very limited and she is barely able to ambulate.  She spends most of her time in a wheelchair.  She does not remember any specific injury to the right foot.   Past Medical History:  Diagnosis Date  . Arthritis   . Cancer (HCC)    Cervical  . Cancer ([place])    Breast  . Cancer ([place])    Vaginal  . Complication of anesthesia   . COPD (chronic obstructive pulmonary disease) ([place])   . Headache(784.0)   . History of radiation therapy 02/28/19- 04/07/18   Esophagus, 1.8 Gy in 28 fractions for a total dose of 50.4 Gy.   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   . Hyperlipidemia   . Leg pain   . Peripheral vascular disease ([place])   . Personal history of chemotherapy   . Personal history of radiation therapy   . PONV (postoperative nausea and vomiting)     Family History  Problem Relation Age of Onset  . Cancer Mother 75       Non-Hodgkins Disease T-Cell  . Heart failure Father   . COPD Father   . Heart disease Brother 24       MI and Heart Disease before age 59  . Heart attack Brother   . Lung cancer Maternal Uncle   . Cancer Cousin   . Prostate cancer Maternal Uncle   . Prostate cancer Brother        half brother  . Breast cancer Neg Hx     SOCIAL HISTORY: Social History   Tobacco Use  . Smoking status: Former Smoker    Packs/day: 0.50    Years: 40.00    Pack years: 20.00    Types: Cigarettes    Quit date: 01/27/2018    Years since quitting: 0.7  . Smokeless tobacco: Never Used  . Tobacco comment: She admits to smoking a few puffs daily- 05/02/18  Substance Use Topics  . Alcohol use: No    Allergies  Allergen Reactions  . Flonase [Fluticasone Propionate] Other (See Comments)    Nose bleeds    Current Outpatient Medications  Medication Sig Dispense Refill  . albuterol (PROVENTIL HFA;VENTOLIN HFA) 108 (90 Base) MCG/ACT inhaler Inhale 2 puffs into the lungs every 6 (six) hours as needed for wheezing or shortness of breath.    Marland Kitchen atorvastatin (LIPITOR) 20 MG tablet Take 20 mg by mouth daily.    . budesonide-formoterol (SYMBICORT) 160-4.5 MCG/ACT inhaler Inhale 1 puff into the lungs 2 (two) times daily.    . cyclobenzaprine (FLEXERIL) 5 MG tablet Take 1 tablet (5 mg total) by mouth 3 (three) times daily as needed for muscle spasms. 270 tablet 0  . dexamethasone (DECADRON) 1 MG tablet Take 3mg  daily in the AM 60 tablet 1  . furosemide (LASIX) 20 MG tablet Take 2 tablets (40 mg total) by mouth daily. Take additional 20mg  pm as needed 90 tablet 0   . HYDROcodone-acetaminophen (NORCO) 7.5-325 MG tablet Take 1 tablet by mouth every 6 (six) hours as needed for moderate pain. 45 tablet 0  . HYDROcodone-homatropine (HYCODAN) 5-1.5 MG/5ML syrup Take 5 mLs by mouth every 6 (six) hours as needed for cough. 240 mL 0  . letrozole (FEMARA) 2.5 MG tablet Take 1 tablet by mouth once daily 30 tablet 0  . lidocaine (XYLOCAINE) 2 % solution MIX 1 PART LIDOCAINE, 1 PART H20 SWALLOW 10ML OF DILUTED MIXTURE, 20 MINUTES BEFORE MEALS AND AT BEDTIME UP TO 3 TIMES DAILY. 300 mL 0  . lidocaine-prilocaine (EMLA) cream Apply 1 application topically as needed. (Patient taking differently: Apply 1 application topically as needed (port). ) 30 g 1  . LORazepam (ATIVAN) 1 MG tablet Take 0.5 tablets (0.5 mg total) by mouth 3 (three) times daily as needed for anxiety or sleep. 45 tablet 0  . magic mouthwash SOLN Take 5 mLs by mouth 4 (four) times daily. 60 mL 0  . metoCLOPramide (REGLAN) 10 MG tablet Take 1 tablet (10 mg total) by mouth every 6 (six) hours as needed. for nausea 180 tablet 1  . omeprazole (PRILOSEC) 40 MG capsule Take 40 mg by mouth daily.    . ondansetron (ZOFRAN) 8 MG tablet Take 1 tablet (8 mg total) by mouth every 8 (eight) hours as needed for nausea or vomiting. 20 tablet 0  . potassium chloride SA (K-DUR) 20 MEQ tablet Take 1 tablet (20 mEq total) by mouth daily. 30 tablet 0  . sucralfate (CARAFATE) 1 g tablet DISSOLVE ONE TABLET IN 10 MLS OF WATER AND SWALLOW UP TO FOUR TIMES DAILY TO SOOTHE THROAT (Patient taking differently: Take 1 g by mouth See admin instructions. DISSOLVE ONE TABLET IN 10 MLS OF WATER AND SWALLOW UP TO FOUR TIMES DAILY TO SOOTHE THROAT) 40 tablet 2  . traZODone (DESYREL) 50 MG tablet Take 1 tablet (50 mg total) by mouth at bedtime. 30 tablet 1  . warfarin (COUMADIN) 5 MG tablet Take 0.5-1 tablets (2.5-5 mg total) by mouth See admin instructions. 2.5 MG on Monday, Wednesday and Saturday 5 Mg on Sunday, Tuesday, Thursday and Friday 30  tablet 2   No current facility-administered medications for this visit.     REVIEW OF SYSTEMS:  [X]  denotes positive finding, [ ]  denotes negative finding Cardiac  Comments:  Chest pain or chest pressure:    Shortness of breath upon exertion:    Short of breath when lying flat:  Irregular heart rhythm:        Vascular    Pain in calf, thigh, or hip brought on by ambulation:    Pain in feet at night that wakes you up from your sleep:  x   Blood clot in your veins:    Leg swelling:         Pulmonary    Oxygen at home:    Productive cough:     Wheezing:         Neurologic    Sudden weakness in arms or legs:     Sudden numbness in arms or legs:     Sudden onset of difficulty speaking or slurred speech:    Temporary loss of vision in one eye:     Problems with dizziness:         Gastrointestinal    Blood in stool:     Vomited blood:         Genitourinary    Burning when urinating:     Blood in urine:        Psychiatric    Major depression:         Hematologic    Bleeding problems:    Problems with blood clotting too easily:        Skin    Rashes or ulcers:        Constitutional    Fever or chills:     PHYSICAL EXAM:   Vitals:   11/01/18 0858  BP: 96/64  Pulse: (!) 102  Resp: 14  Temp: 97.8 F (36.6 C)  TempSrc: Temporal  SpO2: 97%  Weight: 180 lb (81.6 kg)  Height: 5' (1.524 m)    GENERAL: The patient is a well-nourished female, in no acute distress. The vital signs are documented above. CARDIAC: There is a regular rate and rhythm.  VASCULAR: I do not detect carotid bruits. I was able to palpate femoral pulses. I cannot palpate pedal pulses. PULMONARY: There is good air exchange bilaterally without wheezing or rales. ABDOMEN: Soft and non-tender with normal pitched bowel sounds.  MUSCULOSKELETAL: There are no major deformities or cyanosis. NEUROLOGIC: No focal weakness or paresthesias are detected. SKIN: She has ischemic changes to the first  and second toes on the right foot.    PSYCHIATRIC: The patient has a normal affect.  DATA:    LABS: I reviewed her labs from 10/25/2018.  Creatinine was 0.86.  GFR was greater than 60.  ARTERIAL DOPPLER STUDY: I reviewed her arterial Doppler study was done at her last visit.  On the right side she had a monophasic dorsalis pedis and posterior tibial signal with an ABI of 72%.  This was likely falsely elevated secondary to calcific disease.  Her toe pressure was 0.  On the left side she had a triphasic dorsalis pedis and posterior tibial signal.  ABI was 100%.  Toe pressure was 72 mmHg.  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  I have explained that this is a limb threatening problem.  For this reason I recommended arteriography. I have reviewed with the patient the indications for arteriography. In addition, I have reviewed the potential complications of arteriography including but not limited to: Bleeding, arterial injury, arterial thrombosis, dye action, renal insufficiency, or other unpredictable medical problems. I have explained to the patient that if we find disease amenable  to angioplasty we could potentially address this at the same time. I have discussed the potential complications of angioplasty and stenting, including but not limited to: Bleeding, arterial thrombosis, arterial injury, dissection, or the need for surgical intervention.  The situation is further complicated by the fact that she has a difficult time laying flat.  I have explained to her and her daughter that we can generally try to position the patient to be as comfortable as possible although this does make the procedure technically more challenging.  We would have the same issue if we try to get a CT angiogram which I do not think would give adequate results  and would not allow [place] the option of an endovascular approach which would be ideal in her situation.  We will just have to do the best that we can.  We will hold her Coumadin for 4 days prior to the procedure and restart this immediately after the procedure.  [name] [facility] [phone]

## 2018-11-02 ENCOUNTER — Inpatient Hospital Stay: Payer: Medicare Other | Admitting: Nurse Practitioner

## 2018-11-06 ENCOUNTER — Encounter (HOSPITAL_COMMUNITY): Payer: Self-pay | Admitting: Emergency Medicine

## 2018-11-06 ENCOUNTER — Emergency Department (HOSPITAL_COMMUNITY)
Admission: EM | Admit: 2018-11-06 | Discharge: 2018-11-06 | Disposition: A | Discharge status: Home / Self Care | Payer: Medicare Other | Attending: Emergency Medicine | Admitting: Emergency Medicine

## 2018-11-06 ENCOUNTER — Other Ambulatory Visit: Payer: Self-pay

## 2018-11-06 ENCOUNTER — Emergency Department (HOSPITAL_COMMUNITY): Payer: Medicare Other

## 2018-11-06 ENCOUNTER — Telehealth: Payer: Self-pay | Admitting: *Deleted

## 2018-11-06 DIAGNOSIS — J449 Chronic obstructive pulmonary disease, unspecified: Secondary | ICD-10-CM | POA: Diagnosis not present

## 2018-11-06 DIAGNOSIS — Z79899 Other long term (current) drug therapy: Secondary | ICD-10-CM | POA: Diagnosis not present

## 2018-11-06 DIAGNOSIS — I10 Essential (primary) hypertension: Secondary | ICD-10-CM | POA: Insufficient documentation

## 2018-11-06 DIAGNOSIS — Z87891 Personal history of nicotine dependence: Secondary | ICD-10-CM | POA: Diagnosis not present

## 2018-11-06 DIAGNOSIS — M79674 Pain in right toe(s): Secondary | ICD-10-CM | POA: Diagnosis not present

## 2018-11-06 DIAGNOSIS — M19071 Primary osteoarthritis, right ankle and foot: Secondary | ICD-10-CM | POA: Diagnosis not present

## 2018-11-06 DIAGNOSIS — C159 Malignant neoplasm of esophagus, unspecified: Secondary | ICD-10-CM | POA: Insufficient documentation

## 2018-11-06 DIAGNOSIS — C7931 Secondary malignant neoplasm of brain: Secondary | ICD-10-CM | POA: Insufficient documentation

## 2018-11-06 LAB — BASIC METABOLIC PANEL
Anion gap: 11 (ref 5–15)
BUN: 8 mg/dL (ref 8–23)
CO2: 26 mmol/L (ref 22–32)
Calcium: 8.6 mg/dL — ABNORMAL LOW (ref 8.9–10.3)
Chloride: 96 mmol/L — ABNORMAL LOW (ref 98–111)
Creatinine, Ser: 0.76 mg/dL (ref 0.44–1.00)
GFR calc Af Amer: >60 mL/min (ref >60)
GFR calc non Af Amer: >60 mL/min (ref >60)
Glucose, Bld: 109 mg/dL — ABNORMAL HIGH (ref 70–99)
Potassium: 4.5 mmol/L (ref 3.5–5.1)
Sodium: 133 mmol/L — ABNORMAL LOW (ref 135–145)

## 2018-11-06 LAB — CBC WITH DIFFERENTIAL/PLATELET
Abs Immature Granulocytes: 0.04 10*3/uL (ref 0.00–0.07)
Basophils Absolute: 0 10*3/uL (ref 0.0–0.1)
Basophils Relative: 0 %
Eosinophils Absolute: 0.1 10*3/uL (ref 0.0–0.5)
Eosinophils Relative: 2 %
HCT: 32.7 % — ABNORMAL LOW (ref 36.0–46.0)
Hemoglobin: 10.1 g/dL — ABNORMAL LOW (ref 12.0–15.0)
Immature Granulocytes: 1 %
Lymphocytes Relative: 10 %
Lymphs Abs: 0.5 10*3/uL — ABNORMAL LOW (ref 0.7–4.0)
MCH: 31.3 pg (ref 26.0–34.0)
MCHC: 30.9 g/dL (ref 30.0–36.0)
MCV: 101.2 fL — ABNORMAL HIGH (ref 80.0–100.0)
Monocytes Absolute: 0.7 10*3/uL (ref 0.1–1.0)
Monocytes Relative: 16 %
Neutro Abs: 3.3 10*3/uL (ref 1.7–7.7)
Neutrophils Relative %: 71 %
Platelets: 222 10*3/uL (ref 150–400)
RBC: 3.23 MIL/uL — ABNORMAL LOW (ref 3.87–5.11)
RDW: 15.2 % (ref 11.5–15.5)
WBC: 4.7 10*3/uL (ref 4.0–10.5)
nRBC: 0 % (ref 0.0–0.2)

## 2018-11-06 LAB — LACTIC ACID, PLASMA: Lactic Acid, Venous: 1.2 mmol/L (ref 0.5–1.9)

## 2018-11-06 MED ORDER — AMOXICILLIN-POT CLAVULANATE 875-125 MG PO TABS: 1.0000 | ORAL_TABLET | Freq: Two times a day (BID) | ORAL | 0 refills | Status: AC

## 2018-11-06 MED ORDER — FUROSEMIDE 20 MG PO TABS: 40.0000 mg | ORAL_TABLET | Freq: Every day | ORAL | 0 refills | Status: AC

## 2018-11-06 NOTE — ED Notes (Signed)
Patient verbalizes understanding of discharge instructions. Opportunity for questioning and answers were provided. Armband removed by staff, pt discharged from ED.  

## 2018-11-06 NOTE — ED Triage Notes (Signed)
Pt with right great toe discoloration and redness, it was draining but now is dry. She is scheduled for vascular surgery on Friday and takes coumadin. Denies fever, shortness of breath. CNS intact.

## 2018-11-06 NOTE — Discharge Instructions (Signed)
Please take antibiotics as prescribed to prevent against infection Keep your scheduled appointment for your surgery on Friday with Dr. Scot Dock

## 2018-11-06 NOTE — Progress Notes (Signed)
Called by ER regarding pt toe.  No obvious abscess but toe is red.  Pt is sheduled for agram by Dr Scot Dock on Friday.  Review of their image shows a wound that I do not believe requires admission.  She will be placed on oral antibiotics Levaquin vs Augmentin today and return for agram on Friday as scheduled.  Ruta Hinds, MD [facility] [place] Office: [phone] Pager: [phone]

## 2018-11-06 NOTE — ED Provider Notes (Signed)
[name] EMERGENCY DEPARTMENT Provider Note   CSN: [id number] Arrival date & time: 11/06/18  1132     History   Chief Complaint Chief Complaint  Patient presents with  . Toe Pain    HPI Kristin Williams is a 73 y.o. 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  She has an aortogram scheduled for this Friday with Dr. Doren Custard with vascular surgery.  Patient's daughter had called the vascular surgery office this a.m. and was told to come to the ED for further evaluation.  Patient states that the pain is only present when she elevates her feet.  She was told with her cancer that she needs to elevate her feet at night to reduce the swelling.  Currently complaining of chills but states she is always cold.  Husband reports that the toe drained some pus a few days ago but has since only been draining blood.  Patient has no other complaints at this time.        Past Medical History:  Diagnosis Date  . Arthritis   . Cancer (HCC)    Cervical  . Cancer ([place])    Breast  . Cancer ([place])    Vaginal  . Complication of anesthesia   . COPD (chronic obstructive pulmonary disease) ([place])   . Headache(784.0)   . History of radiation therapy 02/28/19- 04/07/18   Esophagus, 1.8 Gy in 28 fractions for a total dose of 50.4 Gy.   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   . Hyperlipidemia   . Leg pain   . Peripheral vascular disease ([place])   . Personal history of chemotherapy   . Personal history of radiation therapy   . PONV (postoperative nausea and vomiting)     Patient Active Problem List   Diagnosis Date Noted  . Bilateral leg edema 09/26/2018  . Acute on chronic respiratory failure with hypoxia ([place]) 08/24/2018  . COPD with acute exacerbation ([place])  08/24/2018  . CAP (community acquired pneumonia) 08/23/2018  . Aspiration pneumonia ([place]) 08/23/2018  . Pneumonia 08/23/2018  . Acute diastolic CHF (congestive heart failure) ([place]) 08/23/2018  . Port-A-Cath in place 03/27/2018  . Cancer ([place])   . DVT, lower extremity, distal, acute, left ([place]) 03/03/2018  . Left leg swelling 02/27/2018  . Thrombocytopenia ([place]) 02/27/2018  . Protein calorie malnutrition ([place]) 02/27/2018  . Cancer of thoracic esophagus ([place]) 02/21/2018  . Goals of care, counseling/discussion 02/17/2018  . Metastasis to brain ([place]) 01/30/2018  . Esophageal cancer, stage IV ([place]) 01/30/2018  . 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, estrogen receptor positive ([place]) 01/30/2018  . Hypertension, essential 01/30/2018  . Hyperlipidemia 01/30/2018  . Tobacco use disorder 01/30/2018  .  Metastatic brain hemorrhage 01/27/2018  . Vasogenic cerebral edema ([place]) 01/27/2018  . Aftercare following surgery of the circulatory system, [place] 12/06/2012  . Iliac artery stenosis, right ([place]) 12/01/2011    Past Surgical History:  Procedure Laterality Date  . ABDOMINAL HYSTERECTOMY  1975  . BREAST LUMPECTOMY  2005  . GANGLION CYST EXCISION  1977  . ILIAC ARTERY STENT  04/20/2004   CSD right iliac occlusive disease  . IR IMAGING GUIDED PORT INSERTION  02/22/2018     OB History   No obstetric history on file.  Home Medications    Prior to Admission medications   Medication Sig Start Date End Date Taking? Authorizing Provider  albuterol (PROVENTIL HFA;VENTOLIN HFA) 108 (90 Base) MCG/ACT inhaler Inhale 2 puffs into the lungs every 6 (six) hours as needed for wheezing or shortness of breath.    [provider]  amoxicillin-clavulanate (AUGMENTIN) 875-125 MG tablet Take 1 tablet by mouth every 12 (twelve) hours. 11/06/18   Alroy Bailiff, Foster Sonnier, PA-C  atorvastatin (LIPITOR) 20 MG tablet Take 20 mg by mouth daily. 07/11/18   [provider]   budesonide-formoterol (SYMBICORT) 160-4.5 MCG/ACT inhaler Inhale 1 puff into the lungs 2 (two) times daily.    [provider]  cyclobenzaprine (FLEXERIL) 5 MG tablet Take 1 tablet (5 mg total) by mouth 3 (three) times daily as needed for muscle spasms. 09/04/18   [name], MD  dexamethasone (DECADRON) 1 MG tablet Take 3mg  daily in the AM 09/12/18   Vaslow, Acey Lav, MD  furosemide (LASIX) 20 MG tablet Take 2 tablets (40 mg total) by mouth daily. Take additional 20mg  pm as needed 11/06/18   Alla Feeling, NP  HYDROcodone-acetaminophen (NORCO) 7.5-325 MG tablet Take 1 tablet by mouth every 6 (six) hours as needed for moderate pain. 11/01/18   Alla Feeling, NP  HYDROcodone-homatropine [facility]) 5-1.5 MG/5ML syrup Take 5 mLs by mouth every 6 (six) hours as needed for cough. 10/09/18   [name], MD  letrozole [facility]) 2.5 MG tablet Take 1 tablet by mouth once daily 10/18/18   [name], MD  lidocaine (XYLOCAINE) 2 % solution MIX 1 PART LIDOCAINE, 1 PART H20 SWALLOW 10ML OF DILUTED MIXTURE, 20 MINUTES BEFORE MEALS AND AT BEDTIME UP TO 3 TIMES DAILY. 08/28/18   [name], MD  lidocaine-prilocaine (EMLA) cream Apply 1 application topically as needed. Patient taking differently: Apply 1 application topically as needed (port).  02/27/18   Maryanna Shape, NP  LORazepam (ATIVAN) 1 MG tablet Take 0.5 tablets (0.5 mg total) by mouth 3 (three) times daily as needed for anxiety or sleep. 08/03/18   Alla Feeling, NP  magic mouthwash SOLN Take 5 mLs by mouth 4 (four) times daily. 07/03/18   [name], MD  metoCLOPramide (REGLAN) 10 MG tablet Take 1 tablet (10 mg total) by mouth every 6 (six) hours as needed. for nausea 10/16/18   [name], MD  omeprazole (PRILOSEC) 40 MG capsule Take 40 mg by mouth daily. 05/17/18   [provider]  ondansetron (ZOFRAN) 8 MG tablet Take 1 tablet (8 mg total) by mouth every 8 (eight) hours as needed for nausea or vomiting. 02/27/18   [name], MD  potassium  chloride SA (K-DUR) 20 MEQ tablet Take 1 tablet (20 mEq total) by mouth daily. 09/01/18   [name], MD  sucralfate (CARAFATE) 1 g tablet DISSOLVE ONE TABLET IN 10 MLS OF WATER AND SWALLOW UP TO FOUR TIMES DAILY TO SOOTHE THROAT Patient taking differently: Take 1 g by mouth See admin instructions. DISSOLVE ONE TABLET IN 10 MLS OF WATER AND SWALLOW UP TO FOUR TIMES DAILY TO SOOTHE THROAT 08/14/18   [name], MD  traZODone (DESYREL) 50 MG tablet Take 1 tablet (50 mg total) by mouth at bedtime. 08/03/18   Alla Feeling, NP  warfarin (COUMADIN) 5 MG tablet Take 0.5-1 tablets (2.5-5 mg total) by mouth See admin instructions. 2.5 MG on Monday, Wednesday and Saturday 5 Mg on Sunday, Tuesday, Thursday and Friday 09/11/18   [name], MD  Family History Family History  Problem Relation Age of Onset  . Cancer Mother 64       Non-Hodgkins Disease T-Cell  . Heart failure Father   . COPD Father   . Heart disease Brother 22       MI and Heart Disease before age 44  . Heart attack Brother   . Lung cancer Maternal Uncle   . Cancer Cousin   . Prostate cancer Maternal Uncle   . Prostate cancer Brother        half brother  . Breast cancer Neg Hx     Social History Social History   Tobacco Use  . Smoking status: Former Smoker    Packs/day: 0.50    Years: 40.00    Pack years: 20.00    Types: Cigarettes    Quit date: 01/27/2018    Years since quitting: 0.7  . Smokeless tobacco: Never Used  . Tobacco comment: She admits to smoking a few puffs daily- 05/02/18  Substance Use Topics  . Alcohol use: No  . Drug use: No     Allergies   Flonase [fluticasone propionate]   Review of Systems Review of Systems  Constitutional: Negative for fever.  HENT: Negative for congestion.   Eyes: Negative for visual disturbance.  Respiratory: Negative for cough.   Cardiovascular: Negative for chest pain.  Gastrointestinal: Negative for abdominal pain.  Genitourinary: Negative for difficulty urinating.   Musculoskeletal: Positive for arthralgias.  Skin: Positive for color change.  Neurological: Negative for headaches.     Physical Exam Updated Vital Signs BP 104/63 (BP Location: Right Arm)   Pulse (!) 105   Temp 98.3 F (36.8 C) (Oral)   Resp 18   LMP 02/17/2018   SpO2 98%   Physical Exam Vitals signs and nursing note reviewed.  Constitutional:      Appearance: She is not ill-appearing.  HENT:     Head: Normocephalic and atraumatic.  Eyes:     Conjunctiva/sclera: Conjunctivae normal.  Neck:     Musculoskeletal: Neck supple.  Cardiovascular:     Rate and Rhythm: Normal rate and regular rhythm.  Pulmonary:     Effort: Pulmonary effort is normal.     Breath sounds: Normal breath sounds.  Abdominal:     Palpations: Abdomen is soft.     Tenderness: There is no abdominal tenderness.  Musculoskeletal:     Comments: See photos below.  Some mild redness to the distal aspect of the right great toe with black discoloration to the medial aspect of the nail edge.  Redness to palpation present.  Range of motion intact throughout.  Good distal pulses.  Strength and sensation intact throughout.   Skin:    General: Skin is warm and dry.  Neurological:     Mental Status: She is alert.          ED Treatments / Results  Labs (all labs ordered are listed, but only abnormal results are displayed) Labs Reviewed  CBC WITH DIFFERENTIAL/PLATELET - Abnormal; Notable for the following components:      Result Value   RBC 3.23 (*)    Hemoglobin 10.1 (*)    HCT 32.7 (*)    MCV 101.2 (*)    Lymphs Abs 0.5 (*)    All other components within normal limits  BASIC METABOLIC PANEL - Abnormal; Notable for the following components:   Sodium 133 (*)    Chloride 96 (*)    Glucose, Bld 109 (*)    Calcium  8.6 (*)    All other components within normal limits  LACTIC ACID, PLASMA    EKG None  Radiology Dg Toe Great Right  Result Date: 11/06/2018 CLINICAL DATA:  Redness and swelling  about the distal aspect of the right great toe. No known injury. EXAM: RIGHT GREAT TOE COMPARISON:  None. FINDINGS: No bony destructive change or periosteal reaction. No fracture or dislocation. First MTP osteoarthritis is noted. No soft tissue gas or radiopaque foreign body. IMPRESSION: No acute abnormality. First MTP osteoarthritis. Electronically Signed   By: Inge Rise M.D.   On: 11/06/2018 13:17    Procedures Procedures (including critical care time)  Medications Ordered in ED Medications - No data to display   Initial Impression / Assessment and Plan / ED Course  I have reviewed the triage vital signs and the nursing notes.  Pertinent labs & imaging results that were available during my care of the patient were reviewed by me and considered in my medical decision making (see chart for details).    A 49-year-old female who presents to the ED with discoloration of her right great toe with redness and some black color.  She has an aortogram with Dr. Scot Dock scheduled for Friday.  Was told to come to the ED for further evaluation after calling the vascular surgery office this a.m.  Blood work obtained prior to being seen.  Without leukocytosis today. hemoglobin stable.  No electrolyte abnormalities.  Lactic acid added on which was negative.  X-ray did not show any tenderness or concern for osteo-at this time.  Discussed case with Dr. Oneida Alar who is on-call for vascular surgery.  He will evaluate patient but reports that she does not need to be admitted then she can be sent home with either Augmentin or Levaquin to cover her until Friday.   Discharge at this time a prescription for Augmentin.  Advised to keep scheduled surgery for Friday with Dr. Scot Dock.  She is in agreement with plan and stable for discharge home at this time.      Final Clinical Impressions(s) / ED Diagnoses   Final diagnoses:  Toe pain, right    ED Discharge Orders         Ordered     amoxicillin-clavulanate (AUGMENTIN) 875-125 MG tablet  Every 12 hours     11/06/18 1458           Eustaquio Maize, PA-C 11/06/18 [place], [place], DO 11/06/18 1506

## 2018-11-06 NOTE — Telephone Encounter (Signed)
Patient's daughter has called to report that her mother has had an increase in rest pain x 1-2 days, along with this, her great toe is now black around the nail bed and it is draining. I have instructed the patient to go to [facility] ED for evaluation. Patient was seen last week by Dr. Scot Dock and has an aortogram scheduled for this Friday but with these new findings I have advised her to go to the hospital. She agrees and will take her mother for evaluation.Kristin Williams

## 2018-11-07 ENCOUNTER — Other Ambulatory Visit (HOSPITAL_COMMUNITY): Payer: Self-pay | Admitting: *Deleted

## 2018-11-07 ENCOUNTER — Other Ambulatory Visit: Payer: Self-pay

## 2018-11-07 ENCOUNTER — Other Ambulatory Visit (HOSPITAL_COMMUNITY)
Admission: RE | Admit: 2018-11-07 | Discharge: 2018-11-07 | Disposition: A | Discharge status: Home / Self Care | Payer: Medicare Other | Source: Ambulatory Visit | Attending: Vascular Surgery | Admitting: Vascular Surgery

## 2018-11-07 DIAGNOSIS — Z01812 Encounter for preprocedural laboratory examination: Secondary | ICD-10-CM | POA: Insufficient documentation

## 2018-11-07 DIAGNOSIS — Z20828 Contact with and (suspected) exposure to other viral communicable diseases: Secondary | ICD-10-CM | POA: Insufficient documentation

## 2018-11-07 DIAGNOSIS — Z20822 Contact with and (suspected) exposure to covid-19: Secondary | ICD-10-CM

## 2018-11-07 DIAGNOSIS — R6889 Other general symptoms and signs: Secondary | ICD-10-CM | POA: Diagnosis not present

## 2018-11-07 LAB — SARS CORONAVIRUS 2 (TAT 6-24 HRS): SARS Coronavirus 2: NEGATIVE

## 2018-11-08 ENCOUNTER — Other Ambulatory Visit: Payer: Medicare Other

## 2018-11-08 ENCOUNTER — Ambulatory Visit: Payer: Medicare Other | Admitting: Hematology

## 2018-11-08 LAB — NOVEL CORONAVIRUS, NAA: SARS-CoV-2, NAA: NOT DETECTED

## 2018-11-10 ENCOUNTER — Ambulatory Visit (HOSPITAL_COMMUNITY)
Admission: RE | Disposition: A | Discharge status: Home / Self Care | Payer: Self-pay | Source: Home / Self Care | Attending: Vascular Surgery

## 2018-11-10 ENCOUNTER — Ambulatory Visit (HOSPITAL_COMMUNITY)
Admission: RE | Admit: 2018-11-10 | Discharge: 2018-11-10 | Disposition: A | Discharge status: Home / Self Care | Payer: Medicare Other | Attending: Vascular Surgery | Admitting: Vascular Surgery

## 2018-11-10 DIAGNOSIS — C7931 Secondary malignant neoplasm of brain: Secondary | ICD-10-CM | POA: Insufficient documentation

## 2018-11-10 DIAGNOSIS — M79671 Pain in right foot: Secondary | ICD-10-CM | POA: Diagnosis not present

## 2018-11-10 DIAGNOSIS — Z9221 Personal history of antineoplastic chemotherapy: Secondary | ICD-10-CM | POA: Insufficient documentation

## 2018-11-10 DIAGNOSIS — Z7901 Long term (current) use of anticoagulants: Secondary | ICD-10-CM | POA: Insufficient documentation

## 2018-11-10 DIAGNOSIS — F17210 Nicotine dependence, cigarettes, uncomplicated: Secondary | ICD-10-CM | POA: Insufficient documentation

## 2018-11-10 DIAGNOSIS — I70221 Atherosclerosis of native arteries of extremities with rest pain, right leg: Secondary | ICD-10-CM | POA: Diagnosis not present

## 2018-11-10 DIAGNOSIS — E785 Hyperlipidemia, unspecified: Secondary | ICD-10-CM | POA: Insufficient documentation

## 2018-11-10 DIAGNOSIS — Z923 Personal history of irradiation: Secondary | ICD-10-CM | POA: Diagnosis not present

## 2018-11-10 DIAGNOSIS — J449 Chronic obstructive pulmonary disease, unspecified: Secondary | ICD-10-CM | POA: Insufficient documentation

## 2018-11-10 DIAGNOSIS — C7889 Secondary malignant neoplasm of other digestive organs: Secondary | ICD-10-CM | POA: Diagnosis not present

## 2018-11-10 DIAGNOSIS — Z853 Personal history of malignant neoplasm of breast: Secondary | ICD-10-CM | POA: Insufficient documentation

## 2018-11-10 DIAGNOSIS — Z79899 Other long term (current) drug therapy: Secondary | ICD-10-CM | POA: Diagnosis not present

## 2018-11-10 HISTORY — PX: PERIPHERAL VASCULAR INTERVENTION: CATH118257

## 2018-11-10 HISTORY — PX: ABDOMINAL AORTOGRAM: CATH118222

## 2018-11-10 HISTORY — PX: LOWER EXTREMITY ANGIOGRAPHY: CATH118251

## 2018-11-10 LAB — POCT I-STAT, CHEM 8
BUN: 4 mg/dL — ABNORMAL LOW (ref 8–23)
Calcium, Ion: 1.22 mmol/L (ref 1.15–1.40)
Chloride: 95 mmol/L — ABNORMAL LOW (ref 98–111)
Creatinine, Ser: 0.6 mg/dL (ref 0.44–1.00)
Glucose, Bld: 93 mg/dL (ref 70–99)
HCT: 33 % — ABNORMAL LOW (ref 36.0–46.0)
Hemoglobin: 11.2 g/dL — ABNORMAL LOW (ref 12.0–15.0)
Potassium: 3.9 mmol/L (ref 3.5–5.1)
Sodium: 133 mmol/L — ABNORMAL LOW (ref 135–145)
TCO2: 27 mmol/L (ref 22–32)

## 2018-11-10 LAB — POCT ACTIVATED CLOTTING TIME
Activated Clotting Time: 279 seconds
Activated Clotting Time: 230 seconds
Activated Clotting Time: 191 seconds
Activated Clotting Time: 169 seconds

## 2018-11-10 LAB — PROTIME-INR
INR: 1.4 — ABNORMAL HIGH (ref 0.8–1.2)
Prothrombin Time: 16.9 seconds — ABNORMAL HIGH (ref 11.4–15.2)

## 2018-11-10 SURGERY — ABDOMINAL AORTOGRAM
Anesthesia: LOCAL | Laterality: Right

## 2018-11-10 MED ORDER — ASPIRIN EC 81 MG PO TBEC: 81.0000 mg | DELAYED_RELEASE_TABLET | Freq: Every day | ORAL | Status: DC

## 2018-11-10 MED ORDER — LIDOCAINE HCL (PF) 1 % IJ SOLN
INTRAMUSCULAR | Status: AC
  Filled 2018-11-10: qty 30

## 2018-11-10 MED ORDER — HEPARIN SODIUM (PORCINE) 1000 UNIT/ML IJ SOLN
INTRAMUSCULAR | Status: DC | PRN
  Administered 2018-11-10: 8000 [IU] via INTRAVENOUS

## 2018-11-10 MED ORDER — MIDAZOLAM HCL 2 MG/2ML IJ SOLN
INTRAMUSCULAR | Status: AC
  Filled 2018-11-10: qty 2

## 2018-11-10 MED ORDER — IODIXANOL 320 MG/ML IV SOLN
INTRAVENOUS | Status: DC | PRN
  Administered 2018-11-10: 165 mL via INTRAVENOUS

## 2018-11-10 MED ORDER — HEPARIN (PORCINE) IN NACL 1000-0.9 UT/500ML-% IV SOLN
INTRAVENOUS | Status: AC
  Filled 2018-11-10: qty 1000

## 2018-11-10 MED ORDER — LABETALOL HCL 5 MG/ML IV SOLN: 10.0000 mg | INTRAVENOUS | Status: DC | PRN

## 2018-11-10 MED ORDER — SODIUM CHLORIDE 0.9 % IV SOLN: 250.0000 mL | INTRAVENOUS | Status: DC | PRN

## 2018-11-10 MED ORDER — FENTANYL CITRATE (PF) 100 MCG/2ML IJ SOLN
INTRAMUSCULAR | Status: DC | PRN
  Administered 2018-11-10 (×2): 50 ug via INTRAVENOUS

## 2018-11-10 MED ORDER — SODIUM CHLORIDE 0.9 % WEIGHT BASED INFUSION: 1.0000 mL/kg/h | INTRAVENOUS | Status: DC

## 2018-11-10 MED ORDER — SODIUM CHLORIDE 0.9 % IV SOLN
INTRAVENOUS | Status: DC
  Administered 2018-11-10: 08:00:00 via INTRAVENOUS

## 2018-11-10 MED ORDER — HEPARIN SODIUM (PORCINE) 1000 UNIT/ML IJ SOLN
INTRAMUSCULAR | Status: AC
  Filled 2018-11-10: qty 1

## 2018-11-10 MED ORDER — OXYCODONE HCL 5 MG PO TABS: 5.0000 mg | ORAL_TABLET | ORAL | Status: DC | PRN

## 2018-11-10 MED ORDER — SODIUM CHLORIDE 0.9% FLUSH: 3.0000 mL | INTRAVENOUS | Status: DC | PRN

## 2018-11-10 MED ORDER — FENTANYL CITRATE (PF) 100 MCG/2ML IJ SOLN
INTRAMUSCULAR | Status: AC
  Filled 2018-11-10: qty 2

## 2018-11-10 MED ORDER — SODIUM CHLORIDE 0.9% FLUSH: 3.0000 mL | Freq: Two times a day (BID) | INTRAVENOUS | Status: DC

## 2018-11-10 MED ORDER — MIDAZOLAM HCL 2 MG/2ML IJ SOLN
INTRAMUSCULAR | Status: DC | PRN
  Administered 2018-11-10: 1 mg via INTRAVENOUS

## 2018-11-10 MED ORDER — HEPARIN (PORCINE) IN NACL 1000-0.9 UT/500ML-% IV SOLN
INTRAVENOUS | Status: DC | PRN
  Administered 2018-11-10 (×2): 500 mL

## 2018-11-10 MED ORDER — LIDOCAINE HCL (PF) 1 % IJ SOLN
INTRAMUSCULAR | Status: DC | PRN
  Administered 2018-11-10 (×2): 15 mL

## 2018-11-10 MED ORDER — HYDRALAZINE HCL 20 MG/ML IJ SOLN: 5.0000 mg | INTRAMUSCULAR | Status: DC | PRN

## 2018-11-10 MED ORDER — ONDANSETRON HCL 4 MG/2ML IJ SOLN: 4.0000 mg | Freq: Four times a day (QID) | INTRAMUSCULAR | Status: DC | PRN

## 2018-11-10 MED ORDER — ACETAMINOPHEN 325 MG PO TABS: 650.0000 mg | ORAL_TABLET | ORAL | Status: DC | PRN

## 2018-11-10 SURGICAL SUPPLY — 18 items
BALLN MUSTANG 8X20X75 (BALLOONS) ×4
BALLOON MUSTANG 8X20X75 (BALLOONS) IMPLANT
CATH ANGIO 5F BER2 65CM (CATHETERS) ×1 IMPLANT
CATH ANGIO 5F PIGTAIL 65CM (CATHETERS) ×1 IMPLANT
DEVICE CONTINUOUS FLUSH (MISCELLANEOUS) ×1 IMPLANT
KIT ENCORE 26 ADVANTAGE (KITS) ×1 IMPLANT
KIT MICROPUNCTURE NIT STIFF (SHEATH) ×1 IMPLANT
KIT PV (KITS) ×4 IMPLANT
SHEATH BRITE TIP 7FR 35CM (SHEATH) ×1 IMPLANT
SHEATH PINNACLE 5F 10CM (SHEATH) ×1 IMPLANT
SHEATH PROBE COVER 6X72 (BAG) ×1 IMPLANT
STENT VIABAHN 7X29X80 VBX (Permanent Stent) ×1 IMPLANT
STOPCOCK MORSE 400PSI 3WAY (MISCELLANEOUS) ×1 IMPLANT
SYR MEDRAD MARK 7 150ML (SYRINGE) ×4 IMPLANT
TRANSDUCER W/STOPCOCK (MISCELLANEOUS) ×4 IMPLANT
TRAY PV CATH (CUSTOM PROCEDURE TRAY) ×4 IMPLANT
TUBING CIL FLEX 10 FLL-RA (TUBING) ×1 IMPLANT
WIRE BENTSON .035X145CM (WIRE) ×2 IMPLANT

## 2018-11-10 NOTE — Progress Notes (Signed)
    7 Fr R FA and 5 fr L FA sheats were pulled, and manual pressure was held for 2X20 min.Sterile gauze was applied at the each site. Both groins were soft and and non tender.   R and L DP were palpable mannualy.   Bed rest started at 1350 X 5 hr. Patient was given instructions about the bed rest.  BP 112/78 HR ST sPO2 100% on R/A

## 2018-11-10 NOTE — Discharge Instructions (Signed)
° °  [facility]  ° °Discharge Instructions ° °Lower Extremity Angiogram; Angioplasty/Stenting ° °Please refer to the following instructions for your post-procedure care. Your surgeon or physician assistant will discuss any changes with you. ° °Activity ° °Avoid lifting more than 8 pounds (1 gallons of milk) for 72 hours (3 days) after your procedure. You may walk as much as you can tolerate. It's OK to drive after 72 hours. ° °Bathing/Showering ° °You may shower the day after your procedure. If you have a bandage, you may remove it at 24- 48 hours. Clean your incision site with mild soap and water. Pat the area dry with a clean towel. ° °Diet ° °Resume your pre-procedure diet. There are no special food restrictions following this procedure. All patients with peripheral vascular disease should follow a low fat/low cholesterol diet. In order to heal from your surgery, it is CRITICAL to get adequate nutrition. Your body requires vitamins, minerals, and protein. Vegetables are the best source of vitamins and minerals. Vegetables also provide the perfect balance of protein. Processed food has little nutritional value, so try to avoid this. ° °Medications ° °Resume taking all of your medications unless your doctor tells you not to. If your incision is causing pain, you may take over-the-counter pain relievers such as acetaminophen (Tylenol) ° °Follow Up ° °Follow up will be arranged at the time of your procedure. You may have an office visit scheduled or may be scheduled for surgery. Ask your surgeon if you have any questions. ° °Please call us immediately for any of the following conditions: °•Severe or worsening pain your legs or feet at rest or with walking. °•Increased pain, redness, drainage at your groin puncture site. °•Fever of 101 degrees or higher. °•If you have any mild or slow bleeding from your puncture site: lie down, apply firm constant pressure over the area with a piece of  gauze or a clean wash cloth for 30 minutes- no peeking!, call 911 right away if you are still bleeding after 30 minutes, or if the bleeding is heavy and unmanageable. ° °Reduce your risk factors of vascular disease: ° °Stop smoking. If you would like help call [facility] at 1-800-QUIT-NOW (1-800-784-8669) or [place] at 336-586-4000. °Manage your cholesterol °Maintain a desired weight °Control your diabetes °Keep your blood pressure down ° °If you have any questions, please call the office at [phone] ° °

## 2018-11-10 NOTE — Interval H&P Note (Signed)
History and Physical Interval Note:  11/10/2018 8:45 AM  Kristin Williams  has presented today for surgery, with the diagnosis of PVD.  The various methods of treatment have been discussed with the patient and family. After consideration of risks, benefits and other options for treatment, the patient has consented to  Procedure(s): ABDOMINAL AORTOGRAM W/LOWER EXTREMITY (Bilateral) as a surgical intervention.  The patient's history has been reviewed, patient examined, no change in status, stable for surgery.  I have reviewed the patient's chart and labs.  Questions were answered to the patient's satisfaction.     [name]

## 2018-11-10 NOTE — Op Note (Signed)
PATIENT: Kristin Williams      MRN: [id number] DOB: [birth year]    DATE OF PROCEDURE: 11/10/2018  INDICATIONS:    MANASA LAROCHE is a 73 y.o. female who presents with rest pain of the right foot and discoloration of her right first and second toes.  She has critical limb ischemia and presents for arteriography.  PROCEDURE:    1.  Conscious sedation 2.  Ultrasound-guided access to the left common femoral artery 3.  Aortogram with bilateral iliac arteriogram and bilateral lower extremity runoff 4.  Ultrasound-guided access to the right common femoral artery 5.  Angioplasty and stenting of the right common iliac artery  SURGEON: Judeth Cornfield. Scot Dock, MD, FACS  ANESTHESIA: Local with sedation  EBL: Minimal  TECHNIQUE: The patient was brought to the peripheral vascular lab and was sedated. The period of conscious sedation was 63 minutes.  During that time period, I was present face-to-face 100% of the time.  The patient was administered 1 mg of Versed and 50 mcg of fentanyl.. The patient's heart rate, blood pressure, and oxygen saturation were monitored by the nurse continuously during the procedure.  Both groins were prepped and draped in the usual sterile fashion.  Under ultrasound guidance, after the skin was anesthetized, I cannulated the left common femoral artery with a micropuncture needle and a micropuncture sheath was introduced over a wire.  This was exchanged for a 5 [place] sheath over a Bentson wire.  By ultrasound the femoral artery was patent. A real-time image was obtained and sent to the server.  A pigtail catheter was positioned at the L1 vertebral body and flush aortogram obtained.  The catheter was then positioned above the aortic bifurcation and oblique iliac projections were obtained.  Next bilateral lower extremity runoff films were obtained.  There was a 60% stenosis in the distal aspect of the stent extending into the common iliac artery on the right.  I did not think I  could address this from the left side.  Under ultrasound guidance, after the skin was anesthetized, I cannulated the right common femoral artery.  Of note both groins were challenging because of her pannus and a somewhat high bifurcation.  The micropuncture sheath was introduced over the wire and then a Bentson wire advanced up to the stenosis.  I had to use a Berenstein catheter to direct the wire through the stenosis up into the aorta.  5 French sheath was placed on the right.  A real-time image of the right common femoral artery cannulation was sent to the server.  There was some posterior plaque in the artery.  I selected a 7 mm x 29 mm VBX stent.  The 5 French sheath was exchanged for a 7 French bright tip sheath.  The patient received heparin and the ACT was monitored throughout the case.  I advanced a 7 French sheath through the stenosis.  The stent was placed to encompass the previously placed stent and extending out into the common iliac artery where there was some Po stenotic dilatation.  The sheath was then retracted.  The stent was deployed.  Completion film showed some residual stenosis I went back with an 8 mm x 2 cm balloon and address this.  Completion film showed no residual stenosis.  I did advance the sheath into the aorta and did a pullback pressure and there was really no significant pressure gradient across the stent.  The patient was transferred to the holding area for removal of the sheath.  No immediate complications were noted.  FINDINGS:   1.  There are single renal arteries with no significant renal artery stenosis identified. 2.  The infrarenal aorta is widely patent. 3.  On the right side there is a recurrent 60% stenosis in the distal aspect of the common iliac artery stent.  This was addressed with balloon angioplasty and stenting as described above.  There was no residual stenosis.  Below that the common femoral, deep femoral, and superficial femoral artery are patent.  The  above-knee popliteal artery is occluded and there is reconstitution only of the posterior tibial artery and the mid calf.  The anterior tibial and peroneal arteries are occluded. 4.  On the left side the common femoral, deep femoral, superficial femoral, popliteal, anterior tibial, tibioperoneal trunk, posterior tibial, and peroneal arteries are patent.  CLINICAL NOTE: By improving the inflow hopefully this will resolve her rest pain and allow for healing of the toes.  If not the only options would either be a femoral posterior tibial bypass which would be associated with increased risk given her multiple medical comorbidities and obesity.  Alternatively she might be considered for a retrograde tibial intervention and I could have Dr. Donzetta Matters reviewed his films.  [name], MD, FACS [facility]  DATE OF DICTATION:   11/10/2018

## 2018-11-11 ENCOUNTER — Other Ambulatory Visit: Payer: Self-pay | Admitting: Hematology

## 2018-11-13 ENCOUNTER — Encounter (HOSPITAL_COMMUNITY): Payer: Self-pay | Admitting: Vascular Surgery

## 2018-11-13 MED FILL — Lidocaine HCl Local Preservative Free (PF) Inj 1%: INTRAMUSCULAR | Qty: 30 | Status: AC

## 2018-11-13 NOTE — Progress Notes (Signed)
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   01/30/2018 Initial Diagnosis   Metastasis to brain [facility])   01/31/2018 Imaging   MRI Brain W WO Contrast 01/31/18  IMPRESSION: 3 Tesla study does not disclose any additional lesions. Solitary metastasis in the right superior cerebellum measuring 2.9 x 2.6 x 3.0 cm with mild surrounding edema. 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   02/03/2018 - 02/08/2018 Radiation Therapy   SBRT with Dr. Isidore Moos on 02/03/18, 02/06/18, 02/08/18   06/12/2018 - 09/04/2018 Chemotherapy   FOLFOX with leucovorin q2weeks    09/12/2018 Imaging   MRI Brain  IMPRESSION: Right cerebellar metastatic deposit unchanged in size. Improvement in surrounding edema. No new enhancing lesions identified.   Negative for hydrocephalus.   Esophageal cancer, stage IV ([place])  01/08/2018 Imaging   CT AP W Constrast 01/08/18  IMPRESSION: 1. Nonobstructed, nondistended bowel. No acute inflammatory process. 2. Moderate-sized hiatal hernia noted. 3. Low-density 1.7 cm left adrenal nodule likely to represent a small  adenoma. Lower pole left renal cyst measuring 1.8 cm. 4. Moderate aortic atherosclerosis. 5. Lumbar spondylosis and facet arthropathy.   01/27/2018 Imaging   CT Chest W Contrast 01/27/18  IMPRESSION: 1. Large mass involving the distal half of the esophagus spanning approximately 11.7 cm is identified compatible with primary esophageal neoplasm. 2. Small posterior mediastinal and high right paratracheal lymph nodes are noted which may represent foci of metastatic adenopathy. 3. The small pulmonary nodules in the right lung are unchanged from 01/15/2016 and are favored to represent a benign process. 4. Aortic Atherosclerosis (ICD10-I70.0) and Emphysema (ICD10-J43.9). 5. Coronary artery atherosclerotic calcifications.   01/30/2018 Initial Diagnosis   Esophageal cancer, stage IV ([place])   02/03/2018 - 02/08/2018 Radiation Therapy   SBRT to oligo brain metastasis, in 3 fractions, under Dr. Isidore Moos    02/16/2018 PET scan   PET 02/16/18 IMPRESSION: 1. A 15.7 cm in length esophageal mass extends from the aortic arch level down to the distal esophagus, maximum SUV 22.4, compatible with malignancy. There is a small right lower neck level IV lymph node with lower than mediastinal blood pool activity, and a right upper paratracheal lymph node with just above mediastinal blood pool activity. 2. Faint speckled heterogeneity in the liver, for example in the lateral segment left hepatic lobe, is likely incidental/benign given the lack of correlate of CT finding. It might be prudent to obtain an MRI just to make sure that there is not a small early metastatic lesion in this vicinity. 3. Known right cerebellar vermian metastatic lesion, with expected hypo activity compared to the surrounding brain activity. 4. The right middle lobe pulmonary nodule measures 1.1 by 1.0 cm and is mildly hypermetabolic  with maximum SUV 2.6. This lesion measured 6 by 5 mm back on 12/06/2003 and 1.2 by 1.1 cm on  10/15/2015. I am uncertain what to make of the very slow growth and mildly accentuated metabolic activity. This could be a chronic granulomatous process. Low-grade adenocarcinoma of the lung seems unlikely to be so indolent as to only mildly increase over a 14 year period, but is presumably a differential diagnostic consideration. Surveillance is suggested. 5. Other imaging findings of potential clinical significance: Aortic Atherosclerosis (ICD10-I70.0). Descending and sigmoid colon diverticulosis. Gas in the urinary bladder, most common cause would be recent catheterization. Minimal chronic left ethmoid sinusitis.   02/27/2018 - 04/09/2018 Chemotherapy   Concurrent chemoRT with weekly carboplatin and Taxol 02/27/2018-04/03/18   02/27/2018 - 04/07/2018 Radiation Therapy   Concurrent ChemoRT 02/27/18-04/07/18   06/12/2018 - 09/04/2018 Chemotherapy   FOLFOX with leucovorin q2weeks    08/31/2018 PET scan      PET 08/30/18 IMPRESSION: 1. Decrease in size and metabolic activity of distal esophageal primary carcinoma. 2. No evidence metastatic adenopathy in the chest or abdomen. 3. Lesion in the cerebellum again noted. 4. Stable pulmonary nodule RIGHT middle lobe is not related to esophageal carcinoma as present in 2005 (smaller then). Favor indolent neoplasm such as carcinoid or hamartoma.   09/12/2018 Imaging   MRI Brain  IMPRESSION: Right cerebellar metastatic deposit unchanged in size. Improvement in surrounding edema. No new enhancing lesions identified.   Negative for hydrocephalus.   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 Ultrasound-guided biopsy is recommended.   01/10/2018 Initial Biopsy   Diagnsis  from Breast Biopsy 01/10/18  Breast, right, needle core biopsy, 6 o'clock - INVASIVE DUCTAL CARCINOMA WITH CALCIFICATIONS, SEE COMMENT. - DUCTAL CARCINOMA IN SITU. 1 of 2 FINAL for Kristin Williams, Kristin Williams [phone]) Microscopic Comment The carcinoma appears grade 2. Prognostic markers will be ordered. Dr. [name] has reviewed the case. The case was called to [place] on 01/11/2018.   01/10/2018 Receptors her2   The tumor cells are NEGATIVE for Her2 (1+). Estrogen Receptor: 100%, POSITIVE, STRONG STAINING INTENSITY Progesterone Receptor: 100%, POSITIVE, STRONG STAINING INTENSITY Proliferation Marker Ki67: 15%   01/10/2018 Cancer Staging   Staging form: Breast, AJCC 8th Edition - Clinical stage from 01/10/2018: Stage IA (cT1b, cN0, cM0, G2, ER+, PR+, HER2-) - Signed by Kristin Merle, MD on 02/03/2018   01/30/2018 Initial Diagnosis   Breast cancer ([place])   05/07/2018 -  Anti-estrogen oral therapy   Anastrozole 80m daily started 05/07/2018. Due to rash, switched to Letrozole 2.547mon 06/2718.       CURRENT THERAPY:  Anastrozole 4m72maily started2/23/2020. Due to rash, switched to Letrozole 2.5mg89m 06/2718.  INTERVAL HISTORY:  BertTILLEY FAETHhere for a follow up. She presents to the clinic alone. She called her daughter to be included in the visit today.  She notes she had a stent placed of abdominal aorta but still has some residual blockage. She also notes her right big toe is stable, no more pain from infection. She notes her LE swelling did improve but is back up today (L>R). Her daughter notes her right buttocks is swollen and bruised. She has not fallen either. She is still on Coumadin. She has bruising of  her upper arms. She notes her odynophagia/dysphagia is still present. She also notes her balance is still poor. She notes being nauseous more in the evening. She takes Ativan and Reglan which helps her. She notes her appetite is low but weight stable. She denies pain  or fever. She notes she would like to wait for next scan and not scope at this time. She has been eating mostly soft or liquid foods. She can eat cut up hamburger patty, chicken and tuna. She notes she is tolerating letrozole well.    REVIEW OF SYSTEMS:   Constitutional: Denies fevers, chills or abnormal weight loss (+) Low appetite (+) general body weakness Eyes: Denies blurriness of vision Ears, nose, mouth, throat, and face: Denies mucositis or sore throat (+) Odynophagia/dysphagia   Respiratory: Denies cough, dyspnea or wheezes Cardiovascular: Denies palpitation, chest discomfort  (+) lower extremity swelling, L>R Gastrointestinal:  Denies heartburn or change in bowel habits (+) nausea Skin: Denies abnormal skin rashes (+) Big toe infection, no more pain (+) Right buttock bruise and swelling  Lymphatics: Denies new lymphadenopathy or easy bruising Neurological: (+) unsteady balance  Behavioral/Psych: Mood is stable, no new changes  All other systems were reviewed with the patient and are negative.  MEDICAL HISTORY:  Past Medical History:  Diagnosis Date   Arthritis    Cancer ([place])    Cervical   Cancer ([place])    Breast   Cancer ([place])    Vaginal   Complication of anesthesia    COPD (chronic obstructive pulmonary disease) ([place])    Headache(784.0)    History of radiation therapy 02/28/19- 04/07/18   Esophagus, 1.8 Gy in 28 fractions for a total dose of 50.4 Gy.    History of radiation therapy 02/03/18- 02/08/18   Brain Rt Sup Cerebellum// 6 FFF photons.    Hyperlipidemia    Leg pain    Peripheral vascular disease ([place])    Personal history of chemotherapy    Personal history of radiation therapy    PONV (postoperative nausea and vomiting)     SURGICAL HISTORY: Past Surgical History:  Procedure Laterality Date   ABDOMINAL AORTOGRAM N/A 11/10/2018   Procedure: ABDOMINAL AORTOGRAM;  Surgeon: Angelia Mould, MD;  Location: [facility];  Service:  Cardiovascular;  Laterality: N/A;   ABDOMINAL HYSTERECTOMY  1975   BREAST LUMPECTOMY  2005   GANGLION CYST EXCISION  1977   ILIAC ARTERY STENT  04/20/2004   CSD right iliac occlusive disease   IR IMAGING GUIDED PORT INSERTION  02/22/2018   LOWER EXTREMITY ANGIOGRAPHY Bilateral 11/10/2018   Procedure: Lower Extremity Angiography;  Surgeon: Angelia Mould, MD;  Location: [facility];  Service: Cardiovascular;  Laterality: Bilateral;   PERIPHERAL VASCULAR INTERVENTION Right 11/10/2018   Procedure: PERIPHERAL VASCULAR INTERVENTION;  Surgeon: Angelia Mould, MD;  Location: [facility];  Service: Cardiovascular;  Laterality: Right;  common iliac    I have reviewed the social history and family history with the patient and they are unchanged from previous note.  ALLERGIES:  is allergic to flonase [fluticasone propionate].  MEDICATIONS:  Current Outpatient Medications  Medication Sig Dispense Refill   albuterol (PROVENTIL HFA;VENTOLIN HFA) 108 (90 Base) MCG/ACT inhaler Inhale 2 puffs into the lungs every 6 (six) hours as needed for wheezing or shortness of breath.     amoxicillin-clavulanate (AUGMENTIN) 875-125 MG tablet Take 1 tablet by mouth every 12 (twelve) hours. 14 tablet 0   atorvastatin (LIPITOR) 20 MG tablet Take 20  mg by mouth daily.     budesonide-formoterol (SYMBICORT) 160-4.5 MCG/ACT inhaler Inhale 1 puff into the lungs 2 (two) times daily.     cyclobenzaprine (FLEXERIL) 5 MG tablet Take 1 tablet (5 mg total) by mouth 3 (three) times daily as needed for muscle spasms. 270 tablet 0   furosemide (LASIX) 20 MG tablet Take 2 tablets (40 mg total) by mouth daily. Take additional 47m pm as needed 90 tablet 0   HYDROcodone-acetaminophen (NORCO) 7.5-325 MG tablet Take 1 tablet by mouth every 6 (six) hours as needed for moderate pain. 45 tablet 0   HYDROcodone-homatropine (HYCODAN) 5-1.5 MG/5ML syrup Take 5 mLs by mouth every 6 (six) hours as needed  for cough. 240 mL 0   letrozole (FEMARA) 2.5 MG tablet Take 1 tablet by mouth once daily 30 tablet 0   lidocaine (XYLOCAINE) 2 % solution MIX 1 PART LIDOCAINE, 1 PART H20 SWALLOW 10ML OF DILUTED MIXTURE, 20 MINUTES BEFORE MEALS AND AT BEDTIME UP TO 3 TIMES DAILY. (Patient taking differently: See admin instructions. MIX 1 PART LIDOCAINE, 1 PART H20 SWALLOW 10ML OF DILUTED MIXTURE, 20 MINUTES BEFORE MEALS AND AT BEDTIME UP TO 3 TIMES DAILY AS NEEDED) 300 mL 0   lidocaine-prilocaine (EMLA) cream Apply 1 application topically as needed. (Patient taking differently: Apply 1 application topically as needed (port). ) 30 g 1   LORazepam (ATIVAN) 1 MG tablet Take 0.5 tablets (0.5 mg total) by mouth 3 (three) times daily as needed for anxiety or sleep. 45 tablet 0   metoCLOPramide (REGLAN) 10 MG tablet Take 1 tablet (10 mg total) by mouth every 6 (six) hours as needed. for nausea 180 tablet 1   omeprazole (PRILOSEC) 40 MG capsule Take 40 mg by mouth daily.     potassium chloride SA (K-DUR) 20 MEQ tablet Take 1 tablet (20 mEq total) by mouth daily. (Patient taking differently: Take 40 mEq by mouth daily. ) 30 tablet 0   sucralfate (CARAFATE) 1 g tablet DISSOLVE ONE TABLET IN 10 MLS OF WATER AND SWALLOW UP TO FOUR TIMES DAILY TO SOOTHE THROAT (Patient taking differently: Take 1 g by mouth See admin instructions. DISSOLVE ONE TABLET IN 10 MLS OF WATER AND SWALLOW UP TO FOUR TIMES DAILY TO SOOTHE THROAT) 40 tablet 2   traZODone (DESYREL) 50 MG tablet Take 1 tablet (50 mg total) by mouth at bedtime. (Patient taking differently: Take 50 mg by mouth at bedtime as needed for sleep. ) 30 tablet 1   warfarin (COUMADIN) 5 MG tablet Take 0.5-1 tablets (2.5-5 mg total) by mouth See admin instructions. 2.5 MG on Monday, Wednesday and Saturday 5 Mg on Sunday, Tuesday, Thursday and Friday 30 tablet 2   No current facility-administered medications for this visit.     PHYSICAL EXAMINATION: ECOG PERFORMANCE STATUS: 3  - Symptomatic, >50% confined to bed  Vitals:   11/16/18 1411  BP: 90/64  Pulse: (!) 110  Resp: 16  Temp: 98.9 F (37.2 C)  SpO2: 98%   Filed Weights   11/16/18 1411  Weight: 179 lb 11.2 oz (81.5 kg)    GENERAL:alert, no distress and comfortable SKIN: skin color, texture, turgor are normal, no rashes or significant lesions (+) Mild skin ecchymosis of right lower buttock  EYES: normal, Conjunctiva are pink and non-injected, sclera clear  NECK: supple, thyroid normal size, non-tender, without nodularity LYMPH:  no palpable lymphadenopathy in the cervical, axillary  LUNGS: clear to auscultation and percussion with normal breathing effort HEART: regular rate & rhythm  and no murmurs (+) lower extremity edema, L>R ABDOMEN:abdomen soft, non-tender and normal bowel sounds Musculoskeletal:no cyanosis of digits and no clubbing  NEURO: alert & oriented x 3 with fluent speech, no focal motor/sensory deficits BREAST: No palpable mass, nodules or adenopathy bilaterally. Breast exam benign.  Exam performed in wheelchair today   LABORATORY DATA:  I have reviewed the data as listed CBC Latest Ref Rng & Units 11/16/2018 11/10/2018 11/06/2018  WBC 4.0 - 10.5 K/uL 7.2 - 4.7  Hemoglobin 12.0 - 15.0 g/dL 9.8(L) 11.2(L) 10.1(L)  Hematocrit 36.0 - 46.0 % 31.1(L) 33.0(L) 32.7(L)  Platelets 150 - 400 K/uL 191 - 222     CMP Latest Ref Rng & Units 11/16/2018 11/10/2018 11/06/2018  Glucose 70 - 99 mg/dL 106(H) 93 109(H)  BUN 8 - 23 mg/dL 7(L) 4(L) 8  Creatinine 0.44 - 1.00 mg/dL 0.67 0.60 0.76  Sodium 135 - 145 mmol/L 132(L) 133(L) 133(L)  Potassium 3.5 - 5.1 mmol/L 3.5 3.9 4.5  Chloride 98 - 111 mmol/L 97(L) 95(L) 96(L)  CO2 22 - 32 mmol/L 27 - 26  Calcium 8.9 - 10.3 mg/dL 8.6(L) - 8.6(L)  Total Protein 6.5 - 8.1 g/dL 5.5(L) - -  Total Bilirubin 0.3 - 1.2 mg/dL 0.6 - -  Alkaline Phos 38 - 126 U/L 140(H) - -  AST 15 - 41 U/L 19 - -  ALT 0 - 44 U/L 10 - -      RADIOGRAPHIC STUDIES: I have  personally reviewed the radiological images as listed and agreed with the findings in the report. No results found.   ASSESSMENT & PLAN:  ATHALENE KOLLE is a 73 y.o. female with   1. General weakness and LE swelling  -Possible related to fluid retention secondary to hypoalbuminemia, long-term steroids and underline cancer  -She has completed outpt PT. I encouraged her to do more non-weight bearing exercise to help her muscle tone.  -She was recently weaned off steroid, stopped in 10/2018. She will repeat brain MRI with Dr. Mickeal Skinner in 3 weeks.  -Currently on lasix BID -Swelling has improved overall but left leg increased since yesterday. Will monitor. If worsens or becomes painful, will obtain doppler.  -She was off Coumadin for vascular procedure last week, but restarted after procedure.  -Labs reviewed, CBC and CMP WNL except Hg 9.8, BG 106, BUN 7, Ca 8.6, Protein 5.5, Albumin 2.5, Alk Phos 140. I encouraged her to take calcium and Vit D.   2.EsophagealAdenocarcinoma,with oligo met incerebellum, stage IV, HER2(-), MSS, and PD-L1(-) -She was diagnosed with esophogeal cancerin 01/2018,with oligometastasisto the brain. Given her stage IV disease her cancer is not curable but still treatable. Goal of care it to control her disease.  -She completedSBRTto her brain meton11/27/2019and completed chemoRTto primary tumoron 04/07/18.She recovered well. -Her molecular testing on the tumor showed MSI stable disease, PD-L1 negative, she is not a candidate for immunotherapy. Her2was also negative,not a candidate for Her2 antibody  -She completed consolidation chemotherapytreatment with FOLFOX q2weeksfor 3 months.  -We discussed PET scan from 08/30/18 which showsdecrease in size and metabolic activity of distal esophageal primary carcinomaand no evidence metastatic adenopathy in the chest or abdomen.She was pleased with the result -We discussed that her esophageal cancer is not  cured.Will repeat scan at the end of this year   3. RightBreastInvasive Ductal Carcinoma,cT1bN0M0 stage IA,Grade II, ER/PR Positive, HER 2 negative -Diagnosed through screening mammogram, same time as her esophageal cancer in October 2019.  -Given her early stage breast cancer, I previously  discussedher breast cancer treatment is less urgent than esophogeal cancertreatment. -Given her EP/PR breast cancer and post-menopausal status,she startedantiestrogen therapy with anastrozoleon 05/07/18, and tolerating wellexcept recent tiny bruising of her breasts and abdomen. Will monitor. -Her breast surgery has been held due to her metastatic esophageal cancer. Given recent poor performance status, she and her family has declined surgery.  -She is tolerating letrozole well, will continue indefinitely.  -Breast exam normal today (11/16/18). Will obtain Breast Mammogram and [place] to evaluate. She is agreeable.   4.Oligo brain mets, ataxia, Insomnia -This is likely from her primaryesophogealcancer.  -She does have balance issues and ambulates with assistance.  -She is s/p SBRT. Hermild slurred speech, right sided weaknessand balance stabilityhas muchimproved -Will monitor with brain scan every 3 months. -Dexa exacerbated her insomnia even with ativan to 1.5 tabs. Ipreviouslycalled in Trazodone 44m nightly as needed (06/12/18) -Herslurred speech continues to progress.She continues to exercise and gaining strength of right side. She continues to practice walking with cane although still unsteady, and with walker. Shehas progressing uncontrolled function of her right hand. Her memory is improving.  -She previously completed tapering dose of Hydrocortisone by Dr. VMickeal Skinner -She has also been weaned off steroid Dexamethasone in 10/2018 due to fluid retention.  -MRI brain from 09/12/18 shows Right cerebellar metastatic deposit is stable. Improvement of surrounding edema. No new enhancing lesions  identified. Negative for hydrocephalus. Next Brain MRI on 9/25   5. Peripheral Artery Disease -Present in the past. She had right iliac stent placed in 04/2004 -She underwent abdominal aortogram and right iliac stent placement by Dr. DScot Dockon 11/10/2018. She will continue to f/u with him    6. Dysphagia/Odynophagia, Low appetite, Nausea   -I discussed her odynophagia and dysphagia could be scar tissue from her prior treatment narrowing her esophagus. I discussed endoscopy if needed. She declined to wait until her next scan.  -She is mostly on soft and liquid diet to help food intake. I encouraged to take small bites and chew very well. I encouraged her to increase her protein intake. Her weight has been maintained.  -Her nausea is better controlled on Reglan and Ativan, will continue   7.Acute deep vein thrombosis (DVT) of popliteal vein of left lower extremity, diagnosed on 02/28/2018  -She was found to have left lower extremity edema on his first day of chemoradiation, Doppler showed DVT -Goal INR is 1.8-2.5 range, due to her high risk of fall and bleeding  -She iscurrentlyon coumadin 538mdailytwo days a week and 2.44m51maily for the rest of week.    9.Mid/low back painfrom compression fracture atT3, T8, and T11 (appear benign). -Back pain manageable with Hydrocodone q6hours and flexeril.  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 H/o Left breast cancer, ER/PR negative, treated by Dr. MagJana Hakimth lumpectomy, adj, chemo and radiation. 12.COPD with chronic cough  -She presented to ED for COPD excerebration on 04/12/18. She has recovered and her breathing is improved. -On Symbicort inhaler.   PLAN:  -MRI brain 9/25 -Lab and flu with Dr VasMickeal Skinner 9/28  -lab and f/u with me in 2 months, will order scan on next visit  -continue supportive care   No problem-specific Assessment & Plan notes found for this encounter.   Orders Placed This Encounter  Procedures     MM DIAG BREAST TOMO BILATERAL    Standing Status:   Future    Standing Expiration Date:   11/16/2019    Order Specific Question:   Reason  for Exam (SYMPTOM  OR DIAGNOSIS REQUIRED)    Answer:   f/u right breast cancer    Order Specific Question:   Preferred imaging location?    Answer:   [facility]   US BREAST LTD UNI RIGHT INC AXILLA    Standing Status:   Future    Standing Expiration Date:   01/16/2020    Order Specific Question:   Reason for Exam (SYMPTOM  OR DIAGNOSIS REQUIRED)    Answer:   f/u right breast cancer    Order Specific Question:   Preferred imaging location?    Answer:   [facility]   All questions were answered. The patient knows to call the clinic with any problems, questions or concerns. No barriers to learning was detected. I spent 25 minutes counseling the patient face to face. The total time spent in the appointment was 30 minutes and more than 50% was on counseling and review of test results     Kristin Merle, MD 11/16/2018   I, Joslyn Devon, am acting as scribe for Kristin Merle, MD.   I have reviewed the above documentation for accuracy and completeness, and I agree with the above.

## 2018-11-16 ENCOUNTER — Encounter: Payer: Self-pay | Admitting: Hematology

## 2018-11-16 ENCOUNTER — Inpatient Hospital Stay: Payer: Medicare Other

## 2018-11-16 ENCOUNTER — Other Ambulatory Visit: Payer: Self-pay

## 2018-11-16 ENCOUNTER — Inpatient Hospital Stay (HOSPITAL_BASED_OUTPATIENT_CLINIC_OR_DEPARTMENT_OTHER): Payer: Medicare Other | Admitting: Hematology

## 2018-11-16 ENCOUNTER — Inpatient Hospital Stay: Payer: Medicare Other | Attending: Hematology

## 2018-11-16 VITALS — BP 90/64 | HR 110 | Temp 98.9°F | Resp 16 | Ht 60.0 in | Wt 179.7 lb

## 2018-11-16 DIAGNOSIS — Z7901 Long term (current) use of anticoagulants: Secondary | ICD-10-CM | POA: Diagnosis not present

## 2018-11-16 DIAGNOSIS — R531 Weakness: Secondary | ICD-10-CM | POA: Insufficient documentation

## 2018-11-16 DIAGNOSIS — C159 Malignant neoplasm of esophagus, unspecified: Secondary | ICD-10-CM | POA: Diagnosis not present

## 2018-11-16 DIAGNOSIS — I82432 Acute embolism and thrombosis of left popliteal vein: Secondary | ICD-10-CM | POA: Diagnosis not present

## 2018-11-16 DIAGNOSIS — I739 Peripheral vascular disease, unspecified: Secondary | ICD-10-CM | POA: Diagnosis not present

## 2018-11-16 DIAGNOSIS — C7931 Secondary malignant neoplasm of brain: Secondary | ICD-10-CM | POA: Insufficient documentation

## 2018-11-16 DIAGNOSIS — I824Z2 Acute embolism and thrombosis of unspecified deep veins of left distal lower extremity: Secondary | ICD-10-CM

## 2018-11-16 DIAGNOSIS — Z95828 Presence of other vascular implants and grafts: Secondary | ICD-10-CM

## 2018-11-16 DIAGNOSIS — C50911 Malignant neoplasm of unspecified site of right female breast: Secondary | ICD-10-CM | POA: Insufficient documentation

## 2018-11-16 DIAGNOSIS — J449 Chronic obstructive pulmonary disease, unspecified: Secondary | ICD-10-CM | POA: Diagnosis not present

## 2018-11-16 DIAGNOSIS — M7989 Other specified soft tissue disorders: Secondary | ICD-10-CM | POA: Diagnosis not present

## 2018-11-16 DIAGNOSIS — C50111 Malignant neoplasm of central portion of right female breast: Secondary | ICD-10-CM | POA: Diagnosis not present

## 2018-11-16 DIAGNOSIS — Z170 Estrogen receptor positive status [ER+]: Secondary | ICD-10-CM | POA: Diagnosis not present

## 2018-11-16 LAB — CBC WITH DIFFERENTIAL (CANCER CENTER ONLY)
Abs Immature Granulocytes: 0.05 10*3/uL (ref 0.00–0.07)
Basophils Absolute: 0 10*3/uL (ref 0.0–0.1)
Basophils Relative: 1 %
Eosinophils Absolute: 0.1 10*3/uL (ref 0.0–0.5)
Eosinophils Relative: 1 %
HCT: 31.1 % — ABNORMAL LOW (ref 36.0–46.0)
Hemoglobin: 9.8 g/dL — ABNORMAL LOW (ref 12.0–15.0)
Immature Granulocytes: 1 %
Lymphocytes Relative: 10 %
Lymphs Abs: 0.7 10*3/uL (ref 0.7–4.0)
MCH: 31 pg (ref 26.0–34.0)
MCHC: 31.5 g/dL (ref 30.0–36.0)
MCV: 98.4 fL (ref 80.0–100.0)
Monocytes Absolute: 0.7 10*3/uL (ref 0.1–1.0)
Monocytes Relative: 9 %
Neutro Abs: 5.7 10*3/uL (ref 1.7–7.7)
Neutrophils Relative %: 78 %
Platelet Count: 191 10*3/uL (ref 150–400)
RBC: 3.16 MIL/uL — ABNORMAL LOW (ref 3.87–5.11)
RDW: 15.3 % (ref 11.5–15.5)
WBC Count: 7.2 10*3/uL (ref 4.0–10.5)
nRBC: 0 % (ref 0.0–0.2)

## 2018-11-16 LAB — CMP (CANCER CENTER ONLY)
ALT: 10 U/L (ref 0–44)
AST: 19 U/L (ref 15–41)
Albumin: 2.5 g/dL — ABNORMAL LOW (ref 3.5–5.0)
Alkaline Phosphatase: 140 U/L — ABNORMAL HIGH (ref 38–126)
Anion gap: 8 (ref 5–15)
BUN: 7 mg/dL — ABNORMAL LOW (ref 8–23)
CO2: 27 mmol/L (ref 22–32)
Calcium: 8.6 mg/dL — ABNORMAL LOW (ref 8.9–10.3)
Chloride: 97 mmol/L — ABNORMAL LOW (ref 98–111)
Creatinine: 0.67 mg/dL (ref 0.44–1.00)
GFR, Est AFR Am: >60 mL/min (ref >60)
GFR, Estimated: >60 mL/min (ref >60)
Glucose, Bld: 106 mg/dL — ABNORMAL HIGH (ref 70–99)
Potassium: 3.5 mmol/L (ref 3.5–5.1)
Sodium: 132 mmol/L — ABNORMAL LOW (ref 135–145)
Total Bilirubin: 0.6 mg/dL (ref 0.3–1.2)
Total Protein: 5.5 g/dL — ABNORMAL LOW (ref 6.5–8.1)

## 2018-11-16 LAB — PROTIME-INR
INR: 1.7 — ABNORMAL HIGH (ref 0.8–1.2)
Prothrombin Time: 20.1 seconds — ABNORMAL HIGH (ref 11.4–15.2)

## 2018-11-16 MED ORDER — HEPARIN SOD (PORK) LOCK FLUSH 100 UNIT/ML IV SOLN
500.0000 [IU] | Freq: Once | INTRAVENOUS | Status: AC
  Administered 2018-11-16: 500 [IU]
  Filled 2018-11-16: qty 5

## 2018-11-16 MED ORDER — SODIUM CHLORIDE 0.9% FLUSH
10.0000 mL | Freq: Once | INTRAVENOUS | Status: AC
  Administered 2018-11-16: 14:00:00 10 mL
  Filled 2018-11-16: qty 10

## 2018-11-17 ENCOUNTER — Telehealth: Payer: Self-pay | Admitting: Hematology

## 2018-11-17 NOTE — Telephone Encounter (Signed)
Scheduled appt per 9/3 los.  Spoke with patient and she is aware of her appt date and time.

## 2018-11-19 ENCOUNTER — Other Ambulatory Visit: Payer: Self-pay | Admitting: Hematology

## 2018-11-21 ENCOUNTER — Other Ambulatory Visit: Payer: Self-pay | Admitting: Hematology

## 2018-11-21 ENCOUNTER — Other Ambulatory Visit: Payer: Self-pay | Admitting: Nurse Practitioner

## 2018-11-21 ENCOUNTER — Telehealth: Payer: Self-pay

## 2018-11-21 DIAGNOSIS — M545 Low back pain, unspecified: Secondary | ICD-10-CM

## 2018-11-21 DIAGNOSIS — C154 Malignant neoplasm of middle third of esophagus: Secondary | ICD-10-CM

## 2018-11-21 MED ORDER — LORAZEPAM 1 MG PO TABS: 0.5000 mg | ORAL_TABLET | Freq: Two times a day (BID) | ORAL | 0 refills | Status: DC | PRN

## 2018-11-21 MED ORDER — SUCRALFATE 1 G PO TABS: 1.0000 g | ORAL_TABLET | Freq: Four times a day (QID) | ORAL | 1 refills | Status: AC

## 2018-11-21 MED ORDER — HYDROCODONE-ACETAMINOPHEN 7.5-325 MG PO TABS: 1.0000 | ORAL_TABLET | Freq: Four times a day (QID) | ORAL | 0 refills | Status: AC | PRN

## 2018-11-21 NOTE — Telephone Encounter (Signed)
Does she want hydrocordone pill or liquid?   [name] MD

## 2018-11-21 NOTE — Telephone Encounter (Signed)
Refilled, thanks   Mayrani Khamis MD  

## 2018-11-21 NOTE — Telephone Encounter (Signed)
Patient's daughter calls stating she needs refill on Hydrocodone and Sucralfate sent into the [facility] on file.

## 2018-11-22 ENCOUNTER — Telehealth: Payer: Self-pay

## 2018-11-22 NOTE — Telephone Encounter (Signed)
TC from Pt's daughter inquiring if it is ok for Pt to receive flu shot. Per Dr. Burr Medico it is ok for Pt. To get the flu shot. Pt's daughter verbalized understanding. No further problems or concerns.

## 2018-11-28 DIAGNOSIS — Z23 Encounter for immunization: Secondary | ICD-10-CM | POA: Diagnosis not present

## 2018-12-01 ENCOUNTER — Telehealth: Payer: Self-pay

## 2018-12-01 NOTE — Telephone Encounter (Signed)
Patient's daughter Kristin Williams) called wanting to let Dr. Burr Medico know that Kristin Williams is having trouble swallowing despite using Sulcrafate and oral Lidocaine soultion. States she's "vomiting clear/thick sputum" again and needs everything pureed. She stated patient has a PET scan scheduled for next month, and an MRI next Friday followed by an office visit with Dr. Mickeal Skinner, but she was instructed to call the office if her mother's status changed. Informed the daughter I would update Dr. Burr Medico, and get back to her with Dr. Ernestina Penna recommendations.   Dr. Burr Medico stated she would like Noreene to be evaluated by her GI doctor (Dr. Acquanetta Sit with [facility]) to see about possible having an upper endoscopy done to assess for any kind of stricture or blockage. She also recommended for patient to increase liquid supplementation (such as Boost or Ensure) if that is easier for her to swallow to ensure she maintains a decent caloric/nutrient intake.   Called Kristin Williams back with above information. Kristin Williams verbalized understanding and agreement to plan, and denied any other questions or concerns. She knows to call the office back should her mother need anything else.

## 2018-12-04 ENCOUNTER — Telehealth: Payer: Self-pay

## 2018-12-04 DIAGNOSIS — G4733 Obstructive sleep apnea (adult) (pediatric): Secondary | ICD-10-CM | POA: Diagnosis not present

## 2018-12-04 NOTE — Telephone Encounter (Signed)
Left message for Dr. Penelope Coop at [facility] to call Dr. Burr Medico on her cell phone.

## 2018-12-05 ENCOUNTER — Other Ambulatory Visit: Payer: Self-pay | Admitting: Radiation Therapy

## 2018-12-06 ENCOUNTER — Telehealth: Payer: Self-pay | Admitting: Emergency Medicine

## 2018-12-06 NOTE — Telephone Encounter (Signed)
Returning phone call from pt's daughter Kristin Williams requesting more information about her f/u with Dr. Penelope Coop (GI).  MD Burr Medico spoke with Dr. Penelope Coop over the phone, discussed scoping/visit to discuss issues with swallowing/nutrition.  Kristin Williams states that she has been contacted by [facility] but that the only appt set up is a virtual visit for Oct 1st.  Per daughter Kristin Williams "what are we supposed to do about right now though?  Because she's not getting her nutrition and I'm all for receiving fluids after her visit with Dr. Mickeal Skinner on Monday but I am concerned that her condition will worsen since she isn't eating properly".  Spoke to MD Burr Medico about daughter's concerns, MD Burr Medico states she will call her back to discuss this further.  Daughter verbalized understanding.

## 2018-12-07 ENCOUNTER — Encounter: Payer: Self-pay | Admitting: Hematology

## 2018-12-07 ENCOUNTER — Inpatient Hospital Stay (HOSPITAL_BASED_OUTPATIENT_CLINIC_OR_DEPARTMENT_OTHER): Payer: Medicare Other | Admitting: Hematology

## 2018-12-07 DIAGNOSIS — M545 Low back pain, unspecified: Secondary | ICD-10-CM

## 2018-12-07 MED ORDER — LORAZEPAM 1 MG PO TABS: 1.0000 mg | ORAL_TABLET | Freq: Three times a day (TID) | ORAL | 0 refills | Status: AC | PRN

## 2018-12-07 MED ORDER — CYCLOBENZAPRINE HCL 5 MG PO TABS: 5.0000 mg | ORAL_TABLET | Freq: Three times a day (TID) | ORAL | 0 refills | Status: AC | PRN

## 2018-12-07 NOTE — Progress Notes (Signed)
[place]   [phone] [phone]   Clinic Follow up Note   Patient Care Team: Lujean Amel, MD as PCP - General (Family Medicine) [name], MD as Consulting Physician (Hematology) Wonda Horner, MD as Consulting Physician (Gastroenterology)   I connected with Alben Deeds on 12/07/2018 at  4:00 PM EDT by video enabled telemedicine visit and verified that I am speaking with the correct person using two identifiers.  I discussed the limitations, risks, security and privacy concerns of performing an evaluation and management service by telephone and the availability of in person appointments. I also discussed with the patient that there may be a patient responsible charge related to this service. The patient expressed understanding and agreed to proceed.   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   01/30/2018 Initial Diagnosis   Metastasis to brain [facility])   01/31/2018 Imaging   MRI Brain W WO Contrast 01/31/18  IMPRESSION: 3 Tesla study does not disclose any additional lesions. Solitary metastasis in the right superior cerebellum measuring 2.9 x 2.6 x 3.0 cm with mild surrounding  edema. 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   02/03/2018 - 02/08/2018 Radiation Therapy   SBRT with Dr. Isidore Moos on 02/03/18, 02/06/18, 02/08/18   06/12/2018 - 09/04/2018 Chemotherapy   FOLFOX with leucovorin q2weeks    09/12/2018 Imaging   MRI Brain  IMPRESSION: Right cerebellar metastatic deposit unchanged in size. Improvement in surrounding edema. No new enhancing lesions identified.   Negative for hydrocephalus.   Esophageal cancer, stage IV ([place])  01/08/2018 Imaging   CT AP W Constrast 01/08/18  IMPRESSION: 1. Nonobstructed, nondistended bowel. No acute inflammatory process. 2. Moderate-sized hiatal hernia noted. 3. Low-density 1.7 cm left adrenal nodule likely to represent a small adenoma. Lower pole left renal cyst measuring 1.8 cm. 4. Moderate aortic atherosclerosis. 5. Lumbar spondylosis and facet arthropathy.   01/27/2018 Imaging   CT Chest W Contrast 01/27/18  IMPRESSION: 1. Large mass involving the distal half of the esophagus spanning approximately 11.7 cm is identified compatible with primary esophageal neoplasm. 2. Small posterior mediastinal and high right paratracheal lymph nodes are noted which may represent foci of metastatic adenopathy. 3. The small pulmonary nodules in the right lung are unchanged from 01/15/2016 and are favored to represent a benign process. 4. Aortic Atherosclerosis (ICD10-I70.0) and Emphysema (ICD10-J43.9). 5. Coronary artery atherosclerotic calcifications.   01/30/2018 Initial Diagnosis   Esophageal cancer, stage IV ([place])   02/03/2018 - 02/08/2018 Radiation Therapy   SBRT to oligo brain metastasis, in 3 fractions, under Dr. Isidore Moos    02/16/2018 PET scan   PET 02/16/18 IMPRESSION: 1. A 15.7 cm in length esophageal mass extends from the aortic arch level down to the distal esophagus, maximum SUV 22.4, compatible with  malignancy. There is a small right lower neck level IV lymph node with lower than mediastinal blood pool activity,  and a right upper paratracheal lymph node with just above mediastinal blood pool activity. 2. Faint speckled heterogeneity in the liver, for example in the lateral segment left hepatic lobe, is likely incidental/benign given the lack of correlate of CT finding. It might be prudent to obtain an MRI just to make sure that there is not a small early metastatic lesion in this vicinity. 3. Known right cerebellar vermian metastatic lesion, with expected hypo activity compared to the surrounding brain activity. 4. The right middle lobe pulmonary nodule measures 1.1 by 1.0 cm and is mildly hypermetabolic with maximum SUV 2.6. This lesion measured 6 by 5 mm back on 12/06/2003 and 1.2 by 1.1 cm on 10/15/2015. I am uncertain what to make of the very slow growth and mildly accentuated metabolic activity. This could be a chronic granulomatous process. Low-grade adenocarcinoma of the lung seems unlikely to be so indolent as to only mildly increase over a 14 year period, but is presumably a differential diagnostic consideration. Surveillance is suggested. 5. Other imaging findings of potential clinical significance: Aortic Atherosclerosis (ICD10-I70.0). Descending and sigmoid colon diverticulosis. Gas in the urinary bladder, most common cause would be recent catheterization. Minimal chronic left ethmoid sinusitis.   02/27/2018 - 04/09/2018 Chemotherapy   Concurrent chemoRT with weekly carboplatin and Taxol 02/27/2018-04/03/18   02/27/2018 - 04/07/2018 Radiation Therapy   Concurrent ChemoRT 02/27/18-04/07/18   06/12/2018 - 09/04/2018 Chemotherapy   FOLFOX with leucovorin q2weeks    08/31/2018 PET scan      PET 08/30/18 IMPRESSION: 1. Decrease in size and metabolic activity of distal esophageal primary carcinoma. 2. No evidence metastatic adenopathy in the chest or abdomen. 3. Lesion in the cerebellum again noted. 4. Stable pulmonary nodule RIGHT middle lobe is not related to esophageal carcinoma  as present in 2005 (smaller then). Favor indolent neoplasm such as carcinoid or hamartoma.   09/12/2018 Imaging   MRI Brain  IMPRESSION: Right cerebellar metastatic deposit unchanged in size. Improvement in surrounding edema. No new enhancing lesions identified.   Negative for hydrocephalus.   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 Ultrasound-guided biopsy is recommended.   01/10/2018 Initial Biopsy   Diagnsis from Breast Biopsy 01/10/18  Breast, right, needle core biopsy, 6 o'clock - INVASIVE DUCTAL CARCINOMA WITH CALCIFICATIONS, SEE COMMENT. - DUCTAL CARCINOMA IN SITU. 1 of 2 FINAL for JOELEE, SNOKE [phone]) Microscopic Comment The carcinoma appears grade 2. Prognostic markers will be ordered. Dr. [name] has reviewed the case. The case was called to [place] on 01/11/2018.   01/10/2018 Receptors her2   The tumor cells are NEGATIVE for Her2 (1+). Estrogen Receptor: 100%, POSITIVE, STRONG STAINING INTENSITY Progesterone Receptor: 100%, POSITIVE, STRONG STAINING INTENSITY Proliferation Marker Ki67: 15%   01/10/2018 Cancer Staging   Staging form: Breast, AJCC 8th Edition - Clinical stage from 01/10/2018: Stage IA (cT1b, cN0, cM0, G2, ER+, PR+, HER2-) - Signed by [name], MD on 02/03/2018   01/30/2018 Initial Diagnosis   Breast cancer ([place])   05/07/2018 -  Anti-estrogen oral therapy   Anastrozole 25m daily started 05/07/2018. Due to rash, switched to Letrozole 2.559mon 06/2718.  CURRENT THERAPY:  Anastrozole 77m daily started2/23/2020. Due to rash, switched to Letrozole 2.574mon 06/2718.  INTERVAL HISTORY:  BeJANNAT ROSEMEYERs here for a follow up of. They identified  themselves by face to face video. She notes she cannot eat even if she cuts it up. She will throw it up. She notes her taste is poor as well. She has been able to keep liquid down. She notes given her lack of enjoyment and struggle with eating she is starting to agree with feeding tube. She has hunger and appetite but not able to keep it down. Her BP is now 98/40. Overall lower this week. She does not want to go to hospice inpatient, hospital or nursing home. She wants to be managed at home. She rather do virtual visit with Dr. VaMickeal Skinnernd wants to proceed with Brain MRI tomorrow.  She needs a refill of flexeril and Ativan. She is ativan 13m17m-3 times a day if needed.    REVIEW OF SYSTEMS:   Constitutional: Denies fevers, chills or abnormal weight loss Eyes: Denies blurriness of vision Ears, nose, mouth, throat, and face: Denies mucositis or sore throat (+) Odynophagia, dysphagia  Respiratory: Denies cough, dyspnea or wheezes  Cardiovascular: Denies palpitation, chest discomfort or lower extremity swelling Gastrointestinal:  Denies heartburn or change in bowel habits (+) N&V Skin: Denies abnormal skin rashes Lymphatics: Denies new lymphadenopathy or easy bruising Neurological:Denies numbness, tingling or new weaknesses Behavioral/Psych: Mood is stable, no new changes  All other systems were reviewed with the patient and are negative.  MEDICAL HISTORY:  Past Medical History:  Diagnosis Date  . Arthritis   . Cancer (HCC)    Cervical  . Cancer ([facility]  Breast  . Cancer ([facility]  Vaginal  . Complication of anesthesia   . COPD (chronic obstructive pulmonary disease) ([place] . Headache(784.0)   . History of radiation therapy 02/28/19- 04/07/18   Esophagus, 1.8 Gy in 28 fractions for a total dose of 50.4 Gy.   . HMarland Kitchenstory of radiation therapy 02/03/18- 02/08/18   Brain Rt Sup Cerebellum// 6 FFF photons.   . Hyperlipidemia   . Leg pain   . Peripheral vascular disease ([place] . Personal  history of chemotherapy   . Personal history of radiation therapy   . PONV (postoperative nausea and vomiting)     SURGICAL HISTORY: Past Surgical History:  Procedure Laterality Date  . ABDOMINAL AORTOGRAM N/A 11/10/2018   Procedure: ABDOMINAL AORTOGRAM;  Surgeon: DicAngelia MouldD;  Location: [facility];  Service: Cardiovascular;  Laterality: N/A;  . ABDOMINAL HYSTERECTOMY  1975  . BREAST LUMPECTOMY  2005  . GANGLION CYST EXCISION  1977  . ILIAC ARTERY STENT  04/20/2004   CSD right iliac occlusive disease  . IR IMAGING GUIDED PORT INSERTION  02/22/2018  . LOWER EXTREMITY ANGIOGRAPHY Bilateral 11/10/2018   Procedure: Lower Extremity Angiography;  Surgeon: DicAngelia MouldD;  Location: [facility];  Service: Cardiovascular;  Laterality: Bilateral;  . PERIPHERAL VASCULAR INTERVENTION Right 11/10/2018   Procedure: PERIPHERAL VASCULAR INTERVENTION;  Surgeon: DicAngelia MouldD;  Location: [facility];  Service: Cardiovascular;  Laterality: Right;  common iliac    I have reviewed the social history and family history with the patient and they are unchanged from previous note.  ALLERGIES:  is allergic to flonase [fluticasone propionate].  MEDICATIONS:  Current Outpatient Medications  Medication Sig Dispense Refill  . albuterol (PROVENTIL HFA;VENTOLIN  HFA) 108 (90 Base) MCG/ACT inhaler Inhale 2 puffs into the lungs every 6 (six) hours as needed for wheezing or shortness of breath.    Marland Kitchen amoxicillin-clavulanate (AUGMENTIN) 875-125 MG tablet Take 1 tablet by mouth every 12 (twelve) hours. 14 tablet 0  . atorvastatin (LIPITOR) 20 MG tablet Take 20 mg by mouth daily.    . budesonide-formoterol (SYMBICORT) 160-4.5 MCG/ACT inhaler Inhale 1 puff into the lungs 2 (two) times daily.    . cyclobenzaprine (FLEXERIL) 5 MG tablet Take 1 tablet (5 mg total) by mouth 3 (three) times daily as needed for muscle spasms. 270 tablet 0  . furosemide (LASIX) 20 MG  tablet Take 2 tablets (40 mg total) by mouth daily. Take additional 90m pm as needed 90 tablet 0  . HYDROcodone-acetaminophen (NORCO) 7.5-325 MG tablet Take 1 tablet by mouth every 6 (six) hours as needed for moderate pain. 60 tablet 0  . HYDROcodone-homatropine (HYCODAN) 5-1.5 MG/5ML syrup Take 5 mLs by mouth every 6 (six) hours as needed for cough. 240 mL 0  . letrozole (FEMARA) 2.5 MG tablet Take 1 tablet by mouth once daily 30 tablet 0  . lidocaine (XYLOCAINE) 2 % solution MIX 1 PART LIDOCAINE, 1 PART H20 SWALLOW 10ML OF DILUTED MIXTURE, 20 MINUTES BEFORE MEALS AND AT BEDTIME UP TO 3 TIMES DAILY. (Patient taking differently: See admin instructions. MIX 1 PART LIDOCAINE, 1 PART H20 SWALLOW 10ML OF DILUTED MIXTURE, 20 MINUTES BEFORE MEALS AND AT BEDTIME UP TO 3 TIMES DAILY AS NEEDED) 300 mL 0  . lidocaine-prilocaine (EMLA) cream Apply 1 application topically as needed. (Patient taking differently: Apply 1 application topically as needed (port). ) 30 g 1  . LORazepam (ATIVAN) 1 MG tablet Take 1 tablet (1 mg total) by mouth every 8 (eight) hours as needed for anxiety. 60 tablet 0  . metoCLOPramide (REGLAN) 10 MG tablet Take 1 tablet (10 mg total) by mouth every 6 (six) hours as needed. for nausea 180 tablet 1  . omeprazole (PRILOSEC) 40 MG capsule Take 40 mg by mouth daily.    . potassium chloride SA (K-DUR) 20 MEQ tablet Take 1 tablet (20 mEq total) by mouth daily. (Patient taking differently: Take 40 mEq by mouth daily. ) 30 tablet 0  . sucralfate (CARAFATE) 1 g tablet Take 1 tablet (1 g total) by mouth 4 (four) times daily. DISSOLVE ONE TABLET IN 10 MLS OF WATER AND SWALLOW UP TO FOUR TIMES DAILY TO SOOTHE THROAT 120 tablet 1  . traZODone (DESYREL) 50 MG tablet Take 1 tablet (50 mg total) by mouth at bedtime. (Patient taking differently: Take 50 mg by mouth at bedtime as needed for sleep. ) 30 tablet 1  . warfarin (COUMADIN) 5 MG tablet Take 0.5-1 tablets (2.5-5 mg total) by mouth See admin  instructions. 2.5 MG on Monday, Wednesday and Saturday 5 Mg on Sunday, Tuesday, Thursday and Friday 30 tablet 2   No current facility-administered medications for this visit.     PHYSICAL EXAMINATION: ECOG PERFORMANCE STATUS: 3 - Symptomatic, >50% confined to bed  No vitals taken today, Exam not performed today   LABORATORY DATA:  I have reviewed the data as listed CBC Latest Ref Rng & Units 11/16/2018 11/10/2018 11/06/2018  WBC 4.0 - 10.5 K/uL 7.2 - 4.7  Hemoglobin 12.0 - 15.0 g/dL 9.8(L) 11.2(L) 10.1(L)  Hematocrit 36.0 - 46.0 % 31.1(L) 33.0(L) 32.7(L)  Platelets 150 - 400 K/uL 191 - 222     CMP Latest Ref Rng & Units 11/16/2018 11/10/2018  11/06/2018  Glucose 70 - 99 mg/dL 106(H) 93 109(H)  BUN 8 - 23 mg/dL 7(L) 4(L) 8  Creatinine 0.44 - 1.00 mg/dL 0.67 0.60 0.76  Sodium 135 - 145 mmol/L 132(L) 133(L) 133(L)  Potassium 3.5 - 5.1 mmol/L 3.5 3.9 4.5  Chloride 98 - 111 mmol/L 97(L) 95(L) 96(L)  CO2 22 - 32 mmol/L 27 - 26  Calcium 8.9 - 10.3 mg/dL 8.6(L) - 8.6(L)  Total Protein 6.5 - 8.1 g/dL 5.5(L) - -  Total Bilirubin 0.3 - 1.2 mg/dL 0.6 - -  Alkaline Phos 38 - 126 U/L 140(H) - -  AST 15 - 41 U/L 19 - -  ALT 0 - 44 U/L 10 - -      RADIOGRAPHIC STUDIES: I have personally reviewed the radiological images as listed and agreed with the findings in the report. No results found.   ASSESSMENT & PLAN:  Kristin Williams is a 73 y.o. female with    1.EsophagealAdenocarcinoma,with oligo met incerebellum, stage IV, HER2(-), MSS, and PD-L1(-) -She was diagnosed with esophogeal cancerin 01/2018,with oligometastasisto the brain. Given her stage IV disease her cancer is not curable but still treatable. Goal of care it to control her disease.  -She completedSBRTto her brain meton11/27/2019and completed chemoRTto primary tumoron 04/07/18.She recovered well. -Her molecular testing on the tumor showed MSI stable disease, PD-L1 negative, she is not a candidate for immunotherapy.  Her2was also negative,not a candidate forHer2 antibody -She completed consolidation chemotherapytreatment with FOLFOX q2weeksfor 3 months.  -PET scan from 08/30/18 showsdecrease in size and metabolic activity of distal esophageal primary carcinomaand no evidence metastatic adenopathy in the chest or abdomen.She was pleased with the result -She has been slowly declining in her strength and eating, has developed worsening dysphagia again, along with nausea, oral intake has significantly decrased.  -I discussed managing her quality of life. She feels her quality of life is low lately given her weakness and poor food intake as she struggles to eat by mouth. I have referred her back to GI Dr. Penelope Coop for EGD and evaluation her candidacy for stenting.  Patient has not decided if she wants a feeding tube. She has scheduled in virtual visit with Dr. Penelope Coop next week, will see if Dr. Penelope Coop can talk to her sooner.  -Patient has been clear that she does not want to go to hospital, and she is too weak to come into the office.  She feels that she has no quality of life. She agrees our goal of therapy is palliative, with focus on her quality of life. -I discussed the option of hospice or palliative care to proceed with supportive care and manage her symptoms only.   -She would like to speak with Dr Penelope Coop first about endoscopy before deciding on hospice or palliative care. She is dehydrated, she is interested in seeing home care aid with labs and IV Fluids while she thinks about more procedures and hospice. -F/u next week by phone.     2. RightBreastInvasive Ductal Carcinoma,cT1bN0M0 stage IA,Grade II, ER/PR Positive, HER 2 negative -Diagnosed through screening mammogram, same time as her esophageal cancer in October 2019.  -Given her early stage breast cancer, I previously discussedher breast cancer treatment is less urgent than esophogeal cancertreatment. -Given her EP/PR breast cancer and  post-menopausal status,she startedantiestrogen therapy with anastrozoleon 05/07/18, and tolerating wellexcept recent tiny bruising of her breasts and abdomen. Will monitor. -Her breast surgery has been held due to her metastatic esophageal cancer. Given recent poor performance status, she and  her family has declined surgery.  -She is tolerating letrozole well, will continue indefinitely.   3.Oligo brain mets, ataxia, Insomnia -This is likely from her primaryesophogealcancer.  -She does have balance issues and ambulates with assistance.  -She is s/p SBRT. Hermild slurred speech, right sided weaknessand balance stabilityhas muchimproved -Will monitor with brain scan every 3 months. -Dexa exacerbated her insomnia even with ativan to 1.5 tabs. Ipreviouslycalled in Trazodone 29m nightly as needed (06/12/18) -Herslurred speech continues to progress.She continues to exercise and gaining strength of right side. She continues to practice walking with cane although still unsteady, and with walker. Shehas progressing uncontrolled function of her right hand. Her memory is improving.  -She previously completed tapering dose of Hydrocortisone by Dr. VMickeal Skinner -She has also been weaned off steroid Dexamethasone in 10/2018 due to fluid retention.  -MRI brain from 09/12/18 shows Right cerebellar metastatic deposit is stable. Improvement of surrounding edema. No new enhancing lesions identified. Negative for hydrocephalus.Next Brain MRI on 9/25   4.Symptom Management: General weakness, LE swelling, Dysphagia/Odynophagia, N&V -Possible related tohypoalbuminemia,long-term steroidsand underline cancer.  -She has completed outpt PT. I encouraged her to do more non-weight bearing exercise to help her muscle tone.  -She was recently weaned off steroid, stopped in 10/2018.  -Currently on lasix BID -Swelling has improved overall. Will monitor. If worsens or becomes painful, will obtain doppler.  -I  encouraged her to take calcium and Vit D.  -She is currently on liquid diet to help food intake. By mouth input is overall struggle for her. I encouraged to take small bites and chew very well. I encouraged her to increase her protein intake.   -Her nausea is better controlled on Reglan and Ativan, will continue   5. Peripheral Artery Disease -Present in the past. She had right iliac stent placed in 04/2004 -She underwent abdominal aortogram and right iliac stent placement by Dr. DScot Dockon 11/10/2018. She will continue to f/u with him  6.Acute deep vein thrombosis (DVT) of popliteal vein of left lower extremity, diagnosed on 02/28/2018  -She was found to have left lower extremity edema on his first day of chemoradiation, Doppler showed DVT -Goal INR is 1.8-2.5 range, due to her high risk of fall and bleeding  -She iscurrentlyon coumadin 571mdailytwo days a week and 2.22m24maily for the rest of week.   7.Mid/low back painfrom compression fracture atT3, T8, and T11 (appear benign). -Back pain manageable with Hydrocodone q6hours and flexeril.  -resolved now  8. Residual Neuropathy, G1-2 9. H/o Left breast cancer, ER/PR negative, treated by Dr. MagJana Hakimth lumpectomy, adj, chemo and radiation. 10.COPD with chronic cough  -She presented to ED for COPD excerebration on 04/12/18. She has recovered and her breathing is improved. -On Symbicort inhaler.   PLAN:  -I refilled Flexeril and Ativan today  -MRI brain 9/25, she would like to proceed -flu with Dr VasMickeal Skinner 9/28, will change to a virtual visit  -Some up home care aid with lab and IV fluids  -continue supportive care -Virtual call next week with me. -will inform Dr. GanPenelope Coopat she would like speak with him ASAP -she will think about palliative care and hospice    No problem-specific Assessment & Plan notes found for this encounter.   No orders of the defined types were placed in this encounter.  I discussed the  assessment and treatment plan with the patient. The patient was provided an opportunity to ask questions and all were answered. The patient agreed with the  plan and demonstrated an understanding of the instructions.  The patient was advised to call back or seek an in-person evaluation if the symptoms worsen or if the condition fails to improve as anticipated.  I provided 25 minutes of face-to-face video visit time during this encounter, and > 50% was spent counseling as documented under my assessment & plan.    [name], MD 12/07/2018   I, Joslyn Devon, am acting as scribe for [name], MD.   I have reviewed the above documentation for accuracy and completeness, and I agree with the above.

## 2018-12-08 ENCOUNTER — Ambulatory Visit
Admission: RE | Admit: 2018-12-08 | Discharge: 2018-12-08 | Disposition: A | Discharge status: Home / Self Care | Payer: Medicare Other | Source: Ambulatory Visit | Attending: Internal Medicine | Admitting: Internal Medicine

## 2018-12-08 ENCOUNTER — Other Ambulatory Visit: Payer: Self-pay

## 2018-12-08 ENCOUNTER — Telehealth: Payer: Self-pay | Admitting: Hematology

## 2018-12-08 ENCOUNTER — Telehealth: Payer: Self-pay

## 2018-12-08 ENCOUNTER — Telehealth: Payer: Self-pay | Admitting: *Deleted

## 2018-12-08 ENCOUNTER — Other Ambulatory Visit: Payer: Self-pay | Admitting: Hematology

## 2018-12-08 DIAGNOSIS — C7931 Secondary malignant neoplasm of brain: Secondary | ICD-10-CM

## 2018-12-08 DIAGNOSIS — C159 Malignant neoplasm of esophagus, unspecified: Secondary | ICD-10-CM

## 2018-12-08 DIAGNOSIS — R1310 Dysphagia, unspecified: Secondary | ICD-10-CM | POA: Diagnosis not present

## 2018-12-08 DIAGNOSIS — C539 Malignant neoplasm of cervix uteri, unspecified: Secondary | ICD-10-CM | POA: Diagnosis not present

## 2018-12-08 MED ORDER — GADOBENATE DIMEGLUMINE 529 MG/ML IV SOLN
18.0000 mL | Freq: Once | INTRAVENOUS | Status: AC | PRN
  Administered 2018-12-08: 13:00:00 18 mL via INTRAVENOUS

## 2018-12-08 NOTE — Telephone Encounter (Signed)
Returned call to pt's daughter, Julianne Handler. She wanted to confirn that pt's visit on Monday is with Dr. Mickeal Skinner and that it is a 'virtual' visit.  She also asked if pt had lab orders for Monday.  Advised that there are no lab orders or appts on Monday, 12/11/18 and that the visit with Dr. Mickeal Skinner is virtual, he will be calling at the appointed time of 9:30 am. No further questions or concerns

## 2018-12-08 NOTE — Telephone Encounter (Signed)
No los per 9/24. °

## 2018-12-08 NOTE — Progress Notes (Signed)
Orders for IVF and weekly labs faxed to [facility] [phone]  1L NS over 2 hours 3x week for 30 days Week labs: CBC, CMP, PT/INR for 4 weeks  Received confirmation that fax went through successfully

## 2018-12-08 NOTE — Telephone Encounter (Signed)
Per Dr. Ernestina Penna request called Dr. Estell Harpin office with [facility] to see if Dr. Penelope Coop could do a virtual visit with patient and her daughter sooner so patient could decide between EGD procedure or pursuing Hospice care. Called and spoke with Shannon-RN who said Dr. Penelope Coop had a 15:15 slot available today if the patient was available. Informed Larene Beach patient has an appointment for MRI of the brain today at 12:40 so I just needed to check with [facility] about how long that procedure would take, and make sure with patient's daughter that they could be home in time for a 15:15 appointment. Called [facility] and spoke to one of their schedulers who said the MRI would take about an hour, and that the patient would be done by 14:00 at the latest. Called patient's daughter Maudie Mercury to see if they finished at 14:00, would they be home in time for a virtual appointment at 15:15. Maudie Mercury said it would take them about 30 min to get home from [place] so they could make the 15:15 appointment with Dr. Penelope Coop. Franchot Heidelberg I would call Dr. Estell Harpin office back and let them know, and for her or Lacia to call us back if they have any issues or concerns. Kim verbalized understanding and agreement, and denied ay further needs at this time.   Called Shannon-RN back to let her know that [facility] would be able to do the virtual visit at 15:15 with Dr. Penelope Coop. Larene Beach confirmed that patient was on the schedule and asked for patient's daughter's number to coordinate the logistics of getting the visit set up. Information provided. Latest office note from Dr. Burr Medico, most recent lab work, and demographic sheet faxed to [facility] #[phone]--received confirmation that fax went through successfully.   Also placed orders for home health referral and faxed referral with most recent office note, lab work, and demographic sheet to [facility]. Received confirmation that fax went through successfully. Called office and spoke with receptionist  who stated Angie was working on patient's case and would be in touch with our office or the patient directly to get a time set up for the home assessment.

## 2018-12-10 ENCOUNTER — Other Ambulatory Visit: Payer: Self-pay | Admitting: Hematology

## 2018-12-11 ENCOUNTER — Telehealth: Payer: Self-pay | Admitting: Hematology

## 2018-12-11 ENCOUNTER — Other Ambulatory Visit: Payer: Self-pay

## 2018-12-11 ENCOUNTER — Inpatient Hospital Stay (HOSPITAL_BASED_OUTPATIENT_CLINIC_OR_DEPARTMENT_OTHER): Payer: Medicare Other | Admitting: Internal Medicine

## 2018-12-11 ENCOUNTER — Inpatient Hospital Stay: Payer: Medicare Other

## 2018-12-11 ENCOUNTER — Telehealth: Payer: Self-pay

## 2018-12-11 DIAGNOSIS — C159 Malignant neoplasm of esophagus, unspecified: Secondary | ICD-10-CM

## 2018-12-11 DIAGNOSIS — C7931 Secondary malignant neoplasm of brain: Secondary | ICD-10-CM | POA: Diagnosis not present

## 2018-12-11 MED ORDER — DEXAMETHASONE 1 MG PO TABS: 2.0000 mg | ORAL_TABLET | Freq: Every day | ORAL | Status: AC

## 2018-12-11 NOTE — Progress Notes (Signed)
I connected with Alben Deeds on 12/11/18 at  9:30 AM EDT by telephone visit and verified that I am speaking with the correct person using two identifiers.  I discussed the limitations, risks, security and privacy concerns of performing an evaluation and management service by telemedicine and the availability of in-person appointments. I also discussed with the patient that there may be a patient responsible charge related to this service. The patient expressed understanding and agreed to proceed.  Other persons participating in the visit and their role in the encounter:  n/a  Patient's location:  Home  Provider's location:  Office  Chief Complaint:  Brain Metastasis, ataxia  History of Present Ilness: Ms. Shimabukuro describes worsening balance issues over the past few weeks.  She is no longer ambulating at all around the house, mostly confined to bed.  Unfortunately she has developed worsening issues with swallowing and has also experienced bloody stool and bloody cough in recent days.  She no longer desires to come to the hospital or even clinic for further treatment because of poor quality of life. Observations: Dysarthic articulation, language and cognition intact.  Imaging:  [facility] Interpretation: I have personally reviewed the CNS images as listed.  My interpretation, in the context of the patient's clinical presentation, is progressive disease  Mr Jeri Cos Wo Contrast  Result Date: 12/08/2018 CLINICAL DATA:  Metastasis to brain. Neoplasm: Head, CNS, Rx monitor or follow-up. Additional history provided: Cerebellar metastasis, history of breast, soft G0 cervical and vaginal cancer. : MRI HEAD WITHOUT AND WITH CONTRAST TECHNIQUE: Multiplanar, multiecho pulse sequences of the brain and surrounding structures were obtained without and with intravenous contrast. CONTRAST:  37mL MULTIHANCE GADOBENATE DIMEGLUMINE 529 MG/ML IV SOLN COMPARISON:  Brain MRI examinations 10/12/2018 and earlier  FINDINGS: Brain: Since prior brain MRI 10/12/2018, an enhancing right cerebellar mass has increased in size, now measuring 3.3 x 3.6 x 3.4 cm (AP x TV x CC), previously 3.0 x 2.7 x 2.7 cm (AP x TV x CC). Redemonstrated susceptibility weighted hypointensity within the mass which may reflect non acute hemorrhage or calcification. Surrounding cerebellar edema and partial effacement of the fourth ventricle have also increased. The lateral and third ventricles are unchanged in size without evidence of hydrocephalus. No new intracranial metastases are identified. Mild generalized parenchymal atrophy. Scattered T2/FLAIR hyperintensity within the cerebral white matter is nonspecific, but consistent with chronic small vessel ischemic disease. Vascular: Flow voids maintained within the proximal large arterial vessels. Skull and upper cervical spine: No convincing marrow lesion. Redemonstrated benign-appearing left posterior frontal vertex enhancement. Sinuses/Orbits: Mild ethmoid sinus mucosal thickening. No significant mastoid effusion. Visualized orbits demonstrate no acute abnormality. These results will be called to the ordering clinician or representative by the Radiologist Assistant, and communication documented in the PACS or zVision Dashboard. IMPRESSION: 1. Interval increase in size of a right cerebellar mass, now measuring 3.3 x 3.6 x 3.4 cm (previously 3.0 x 2.7 x 2.7 cm). 2. Surrounding cerebellar edema and partial effacement of the fourth ventricle have also increased. No hydrocephalus. 3. No new intracranial metastases are identified. 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  We reviewed options for treatment, including steroids, avastin, surgical approach such as LITT.  She would prefer symptomatic management only and will discuss transition to comfort care with Dr.  Burr Medico later today.  Will recommend rx with decadron 2mg   daily for symptom burden.  Follow Up Instructions: Return to clinic only as needed.  No further imaging scheduled.   I discussed the assessment and treatment plan with the patient.  The patient was provided an opportunity to ask questions and all were answered.  The patient agreed with the plan and demonstrated understanding of the instructions.    The patient was advised to call back or seek an in-person evaluation if the symptoms worsen or if the condition fails to improve as anticipated.  I provided 5-10 minutes of non-face-to-face time during this enocunter.  Ventura Sellers, MD   I provided 15 minutes of non face-to-face telephone visit time during this encounter, and > 50% was spent counseling as documented under my assessment & plan.

## 2018-12-11 NOTE — Telephone Encounter (Signed)
I called pt and spoke with her and her daughter Kristin Williams.  Patient had a few episodes of melena, vomiting with blood last Friday and Saturday, none since then.  She is very weak, able to keep some liquids down, but not much.  She had a virtual visit with Dr. Penelope Coop last Friday, who ordered an barium swallow test, but she does not think she can do it.  She again stated she does not want to go to hospital or clinic, and her body may not be able to tolerate more procedures. I discussed the goal of care is palliative, and keep her at home. I recommend her to consider hospice.  After a lengthy discussion, she agrees, will refer her to healthy habits and hospice, for advanced medication for future appointments.   I will remain to be his MD when he is under hospice care.   I spoke with Dr. Mickeal Skinner today, who is in complete agreement.   [name]  12/11/2018

## 2018-12-11 NOTE — Telephone Encounter (Signed)
Daughter called and said that she would like to cancel her appt for Wednesday as she is now in [facility].   Advised her that we would do so and let Dr Scot Dock know as well.   York Cerise, CMA

## 2018-12-11 NOTE — Progress Notes (Signed)
Hospice referral ordered and last MD note with patient demographic sheet faxed to [facility] at [phone]

## 2018-12-12 ENCOUNTER — Telehealth: Payer: Self-pay | Admitting: Internal Medicine

## 2018-12-12 ENCOUNTER — Other Ambulatory Visit: Payer: Self-pay | Admitting: Gastroenterology

## 2018-12-12 DIAGNOSIS — C159 Malignant neoplasm of esophagus, unspecified: Secondary | ICD-10-CM

## 2018-12-12 DIAGNOSIS — J449 Chronic obstructive pulmonary disease, unspecified: Secondary | ICD-10-CM | POA: Diagnosis not present

## 2018-12-12 DIAGNOSIS — R1310 Dysphagia, unspecified: Secondary | ICD-10-CM

## 2018-12-12 DIAGNOSIS — I739 Peripheral vascular disease, unspecified: Secondary | ICD-10-CM | POA: Diagnosis not present

## 2018-12-12 DIAGNOSIS — R1319 Other dysphagia: Secondary | ICD-10-CM

## 2018-12-12 DIAGNOSIS — C50111 Malignant neoplasm of central portion of right female breast: Secondary | ICD-10-CM | POA: Diagnosis not present

## 2018-12-12 DIAGNOSIS — C7931 Secondary malignant neoplasm of brain: Secondary | ICD-10-CM | POA: Diagnosis not present

## 2018-12-12 NOTE — Telephone Encounter (Signed)
No los per 9/28. °

## 2018-12-13 ENCOUNTER — Encounter (HOSPITAL_COMMUNITY): Payer: Medicare Other

## 2018-12-13 ENCOUNTER — Inpatient Hospital Stay: Payer: Medicare Other

## 2018-12-13 ENCOUNTER — Encounter: Payer: Medicare Other | Admitting: Vascular Surgery

## 2018-12-13 DIAGNOSIS — J449 Chronic obstructive pulmonary disease, unspecified: Secondary | ICD-10-CM | POA: Diagnosis not present

## 2018-12-13 DIAGNOSIS — C50111 Malignant neoplasm of central portion of right female breast: Secondary | ICD-10-CM | POA: Diagnosis not present

## 2018-12-13 DIAGNOSIS — I739 Peripheral vascular disease, unspecified: Secondary | ICD-10-CM | POA: Diagnosis not present

## 2018-12-13 DIAGNOSIS — C7931 Secondary malignant neoplasm of brain: Secondary | ICD-10-CM | POA: Diagnosis not present

## 2018-12-13 DIAGNOSIS — C159 Malignant neoplasm of esophagus, unspecified: Secondary | ICD-10-CM | POA: Diagnosis not present

## 2018-12-14 ENCOUNTER — Telehealth: Payer: Self-pay

## 2018-12-14 DIAGNOSIS — I739 Peripheral vascular disease, unspecified: Secondary | ICD-10-CM | POA: Diagnosis not present

## 2018-12-14 DIAGNOSIS — C7931 Secondary malignant neoplasm of brain: Secondary | ICD-10-CM | POA: Diagnosis not present

## 2018-12-14 DIAGNOSIS — C159 Malignant neoplasm of esophagus, unspecified: Secondary | ICD-10-CM | POA: Diagnosis not present

## 2018-12-14 DIAGNOSIS — C50111 Malignant neoplasm of central portion of right female breast: Secondary | ICD-10-CM | POA: Diagnosis not present

## 2018-12-14 DIAGNOSIS — J449 Chronic obstructive pulmonary disease, unspecified: Secondary | ICD-10-CM | POA: Diagnosis not present

## 2018-12-14 NOTE — Telephone Encounter (Signed)
Faxed signed orders back to [facility] fax [phone], sent to HIM for scanning to chart.

## 2018-12-15 ENCOUNTER — Telehealth: Payer: Self-pay

## 2018-12-15 NOTE — Telephone Encounter (Signed)
[facility] back to fax [phone], sent to HIM for scan to chart.

## 2018-12-18 DIAGNOSIS — C159 Malignant neoplasm of esophagus, unspecified: Secondary | ICD-10-CM | POA: Diagnosis not present

## 2018-12-18 DIAGNOSIS — C50111 Malignant neoplasm of central portion of right female breast: Secondary | ICD-10-CM | POA: Diagnosis not present

## 2018-12-18 DIAGNOSIS — J449 Chronic obstructive pulmonary disease, unspecified: Secondary | ICD-10-CM | POA: Diagnosis not present

## 2018-12-18 DIAGNOSIS — I739 Peripheral vascular disease, unspecified: Secondary | ICD-10-CM | POA: Diagnosis not present

## 2018-12-18 DIAGNOSIS — C7931 Secondary malignant neoplasm of brain: Secondary | ICD-10-CM | POA: Diagnosis not present

## 2018-12-21 DIAGNOSIS — C7931 Secondary malignant neoplasm of brain: Secondary | ICD-10-CM | POA: Diagnosis not present

## 2018-12-21 DIAGNOSIS — C50111 Malignant neoplasm of central portion of right female breast: Secondary | ICD-10-CM | POA: Diagnosis not present

## 2018-12-21 DIAGNOSIS — C159 Malignant neoplasm of esophagus, unspecified: Secondary | ICD-10-CM | POA: Diagnosis not present

## 2018-12-21 DIAGNOSIS — J449 Chronic obstructive pulmonary disease, unspecified: Secondary | ICD-10-CM | POA: Diagnosis not present

## 2018-12-21 DIAGNOSIS — I739 Peripheral vascular disease, unspecified: Secondary | ICD-10-CM | POA: Diagnosis not present

## 2018-12-23 DIAGNOSIS — J449 Chronic obstructive pulmonary disease, unspecified: Secondary | ICD-10-CM | POA: Diagnosis not present

## 2018-12-23 DIAGNOSIS — C7931 Secondary malignant neoplasm of brain: Secondary | ICD-10-CM | POA: Diagnosis not present

## 2018-12-23 DIAGNOSIS — C159 Malignant neoplasm of esophagus, unspecified: Secondary | ICD-10-CM | POA: Diagnosis not present

## 2018-12-23 DIAGNOSIS — C50111 Malignant neoplasm of central portion of right female breast: Secondary | ICD-10-CM | POA: Diagnosis not present

## 2018-12-23 DIAGNOSIS — I739 Peripheral vascular disease, unspecified: Secondary | ICD-10-CM | POA: Diagnosis not present

## 2018-12-24 DIAGNOSIS — C159 Malignant neoplasm of esophagus, unspecified: Secondary | ICD-10-CM | POA: Diagnosis not present

## 2018-12-24 DIAGNOSIS — J449 Chronic obstructive pulmonary disease, unspecified: Secondary | ICD-10-CM | POA: Diagnosis not present

## 2018-12-24 DIAGNOSIS — I739 Peripheral vascular disease, unspecified: Secondary | ICD-10-CM | POA: Diagnosis not present

## 2018-12-24 DIAGNOSIS — C7931 Secondary malignant neoplasm of brain: Secondary | ICD-10-CM | POA: Diagnosis not present

## 2018-12-24 DIAGNOSIS — C50111 Malignant neoplasm of central portion of right female breast: Secondary | ICD-10-CM | POA: Diagnosis not present

## 2018-12-25 ENCOUNTER — Telehealth: Payer: Self-pay

## 2018-12-25 DIAGNOSIS — C7931 Secondary malignant neoplasm of brain: Secondary | ICD-10-CM | POA: Diagnosis not present

## 2018-12-25 DIAGNOSIS — I739 Peripheral vascular disease, unspecified: Secondary | ICD-10-CM | POA: Diagnosis not present

## 2018-12-25 DIAGNOSIS — C50111 Malignant neoplasm of central portion of right female breast: Secondary | ICD-10-CM | POA: Diagnosis not present

## 2018-12-25 DIAGNOSIS — C159 Malignant neoplasm of esophagus, unspecified: Secondary | ICD-10-CM | POA: Diagnosis not present

## 2018-12-25 DIAGNOSIS — J449 Chronic obstructive pulmonary disease, unspecified: Secondary | ICD-10-CM | POA: Diagnosis not present

## 2018-12-25 NOTE — Telephone Encounter (Signed)
Faxed back signed orders to [facility] to fax [phone], sent to HIM for scanning to chart.

## 2018-12-26 DIAGNOSIS — C50111 Malignant neoplasm of central portion of right female breast: Secondary | ICD-10-CM | POA: Diagnosis not present

## 2018-12-26 DIAGNOSIS — C7931 Secondary malignant neoplasm of brain: Secondary | ICD-10-CM | POA: Diagnosis not present

## 2018-12-26 DIAGNOSIS — C159 Malignant neoplasm of esophagus, unspecified: Secondary | ICD-10-CM | POA: Diagnosis not present

## 2018-12-26 DIAGNOSIS — I739 Peripheral vascular disease, unspecified: Secondary | ICD-10-CM | POA: Diagnosis not present

## 2018-12-26 DIAGNOSIS — J449 Chronic obstructive pulmonary disease, unspecified: Secondary | ICD-10-CM | POA: Diagnosis not present

## 2018-12-27 ENCOUNTER — Telehealth: Payer: Self-pay

## 2018-12-27 DIAGNOSIS — J449 Chronic obstructive pulmonary disease, unspecified: Secondary | ICD-10-CM | POA: Diagnosis not present

## 2018-12-27 DIAGNOSIS — I739 Peripheral vascular disease, unspecified: Secondary | ICD-10-CM | POA: Diagnosis not present

## 2018-12-27 DIAGNOSIS — C159 Malignant neoplasm of esophagus, unspecified: Secondary | ICD-10-CM | POA: Diagnosis not present

## 2018-12-27 DIAGNOSIS — C50111 Malignant neoplasm of central portion of right female breast: Secondary | ICD-10-CM | POA: Diagnosis not present

## 2018-12-27 DIAGNOSIS — C7931 Secondary malignant neoplasm of brain: Secondary | ICD-10-CM | POA: Diagnosis not present

## 2018-12-27 NOTE — Telephone Encounter (Signed)
Faxed signed orders to [facility] at [phone] sent to HIM to scan

## 2018-12-28 DIAGNOSIS — C7931 Secondary malignant neoplasm of brain: Secondary | ICD-10-CM | POA: Diagnosis not present

## 2018-12-28 DIAGNOSIS — C159 Malignant neoplasm of esophagus, unspecified: Secondary | ICD-10-CM | POA: Diagnosis not present

## 2018-12-28 DIAGNOSIS — J449 Chronic obstructive pulmonary disease, unspecified: Secondary | ICD-10-CM | POA: Diagnosis not present

## 2018-12-28 DIAGNOSIS — I739 Peripheral vascular disease, unspecified: Secondary | ICD-10-CM | POA: Diagnosis not present

## 2018-12-28 DIAGNOSIS — C50111 Malignant neoplasm of central portion of right female breast: Secondary | ICD-10-CM | POA: Diagnosis not present

## 2018-12-29 DIAGNOSIS — I739 Peripheral vascular disease, unspecified: Secondary | ICD-10-CM | POA: Diagnosis not present

## 2018-12-29 DIAGNOSIS — C159 Malignant neoplasm of esophagus, unspecified: Secondary | ICD-10-CM | POA: Diagnosis not present

## 2018-12-29 DIAGNOSIS — C7931 Secondary malignant neoplasm of brain: Secondary | ICD-10-CM | POA: Diagnosis not present

## 2018-12-29 DIAGNOSIS — J449 Chronic obstructive pulmonary disease, unspecified: Secondary | ICD-10-CM | POA: Diagnosis not present

## 2018-12-29 DIAGNOSIS — C50111 Malignant neoplasm of central portion of right female breast: Secondary | ICD-10-CM | POA: Diagnosis not present

## 2018-12-30 DIAGNOSIS — I739 Peripheral vascular disease, unspecified: Secondary | ICD-10-CM | POA: Diagnosis not present

## 2018-12-30 DIAGNOSIS — C159 Malignant neoplasm of esophagus, unspecified: Secondary | ICD-10-CM | POA: Diagnosis not present

## 2018-12-30 DIAGNOSIS — C7931 Secondary malignant neoplasm of brain: Secondary | ICD-10-CM | POA: Diagnosis not present

## 2018-12-30 DIAGNOSIS — J449 Chronic obstructive pulmonary disease, unspecified: Secondary | ICD-10-CM | POA: Diagnosis not present

## 2018-12-30 DIAGNOSIS — C50111 Malignant neoplasm of central portion of right female breast: Secondary | ICD-10-CM | POA: Diagnosis not present

## 2018-12-31 DIAGNOSIS — J449 Chronic obstructive pulmonary disease, unspecified: Secondary | ICD-10-CM | POA: Diagnosis not present

## 2018-12-31 DIAGNOSIS — C159 Malignant neoplasm of esophagus, unspecified: Secondary | ICD-10-CM | POA: Diagnosis not present

## 2018-12-31 DIAGNOSIS — C50111 Malignant neoplasm of central portion of right female breast: Secondary | ICD-10-CM | POA: Diagnosis not present

## 2018-12-31 DIAGNOSIS — I739 Peripheral vascular disease, unspecified: Secondary | ICD-10-CM | POA: Diagnosis not present

## 2018-12-31 DIAGNOSIS — C7931 Secondary malignant neoplasm of brain: Secondary | ICD-10-CM | POA: Diagnosis not present

## 2019-01-01 ENCOUNTER — Other Ambulatory Visit: Payer: Medicare Other

## 2019-01-01 DIAGNOSIS — I739 Peripheral vascular disease, unspecified: Secondary | ICD-10-CM | POA: Diagnosis not present

## 2019-01-01 DIAGNOSIS — C159 Malignant neoplasm of esophagus, unspecified: Secondary | ICD-10-CM | POA: Diagnosis not present

## 2019-01-01 DIAGNOSIS — C7931 Secondary malignant neoplasm of brain: Secondary | ICD-10-CM | POA: Diagnosis not present

## 2019-01-01 DIAGNOSIS — C50111 Malignant neoplasm of central portion of right female breast: Secondary | ICD-10-CM | POA: Diagnosis not present

## 2019-01-01 DIAGNOSIS — J449 Chronic obstructive pulmonary disease, unspecified: Secondary | ICD-10-CM | POA: Diagnosis not present

## 2019-01-02 DIAGNOSIS — C7931 Secondary malignant neoplasm of brain: Secondary | ICD-10-CM | POA: Diagnosis not present

## 2019-01-02 DIAGNOSIS — C50111 Malignant neoplasm of central portion of right female breast: Secondary | ICD-10-CM | POA: Diagnosis not present

## 2019-01-02 DIAGNOSIS — I739 Peripheral vascular disease, unspecified: Secondary | ICD-10-CM | POA: Diagnosis not present

## 2019-01-02 DIAGNOSIS — C159 Malignant neoplasm of esophagus, unspecified: Secondary | ICD-10-CM | POA: Diagnosis not present

## 2019-01-02 DIAGNOSIS — J449 Chronic obstructive pulmonary disease, unspecified: Secondary | ICD-10-CM | POA: Diagnosis not present

## 2019-01-03 ENCOUNTER — Telehealth: Payer: Self-pay

## 2019-01-03 DIAGNOSIS — C159 Malignant neoplasm of esophagus, unspecified: Secondary | ICD-10-CM | POA: Diagnosis not present

## 2019-01-03 DIAGNOSIS — C7931 Secondary malignant neoplasm of brain: Secondary | ICD-10-CM | POA: Diagnosis not present

## 2019-01-03 DIAGNOSIS — I739 Peripheral vascular disease, unspecified: Secondary | ICD-10-CM | POA: Diagnosis not present

## 2019-01-03 DIAGNOSIS — J449 Chronic obstructive pulmonary disease, unspecified: Secondary | ICD-10-CM | POA: Diagnosis not present

## 2019-01-03 DIAGNOSIS — C50111 Malignant neoplasm of central portion of right female breast: Secondary | ICD-10-CM | POA: Diagnosis not present

## 2019-01-04 ENCOUNTER — Telehealth: Payer: Self-pay

## 2019-01-04 NOTE — Telephone Encounter (Signed)
Received a call from Cher Nakai, Hospice that patient passed away on 2023-01-17 at home.

## 2019-01-08 ENCOUNTER — Telehealth: Payer: Self-pay

## 2019-01-08 NOTE — Telephone Encounter (Signed)
Faxed back signed orders to [place], sent to HIM for scan to chart.

## 2019-01-10 ENCOUNTER — Telehealth: Payer: Self-pay

## 2019-01-10 NOTE — Telephone Encounter (Signed)
Faxed copy of signed death certificate to [facility] at fax [phone], original placed at reception desk for them to pick up, copy sent to HIM to be scanned into chart.  I spoke with patient's son Kristin Williams to let him know this has been done.  He verbalized an understanding.

## 2019-01-14 NOTE — Telephone Encounter (Signed)
Faxed signed orders back to [facility], sent to HIM for scan to chart.  

## 2019-01-14 DEATH — deceased

## 2019-01-17 ENCOUNTER — Other Ambulatory Visit: Payer: Medicare Other

## 2019-01-17 ENCOUNTER — Ambulatory Visit: Payer: Medicare Other | Admitting: Hematology

## 2020-02-17 IMAGING — US US ABDOMEN COMPLETE
1 series · 13 of 25 positions shown · non-contrast
Comparison: None.

CLINICAL DATA: Abdominal pain with nausea and vomiting

EXAM:
ABDOMEN ULTRASOUND COMPLETE

[Series 1: us abdomen complete · 0.25mm/px · 13 of 87 slices shown]
[im 1/87]
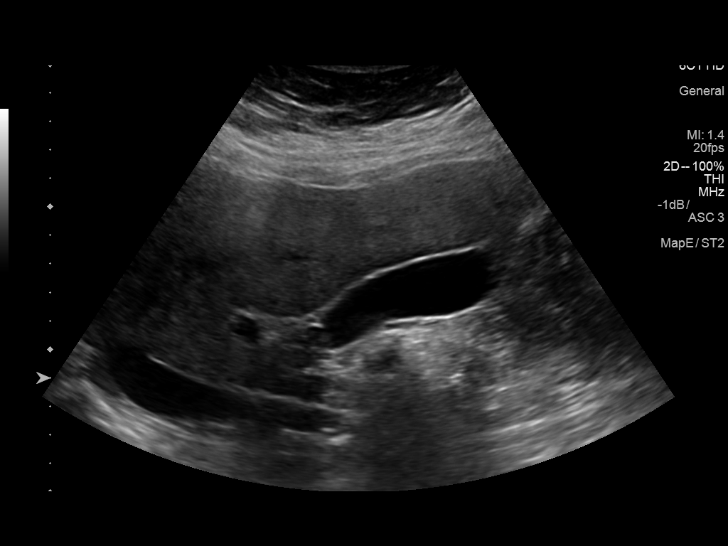
[im 8/87]
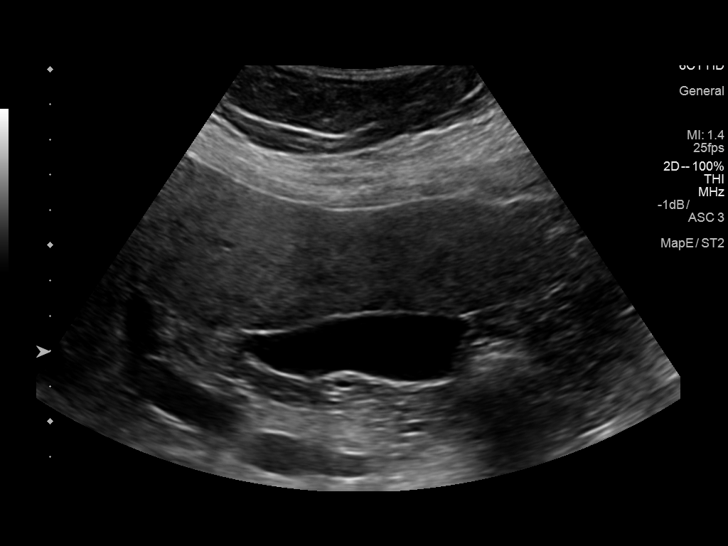
[im 15/87]
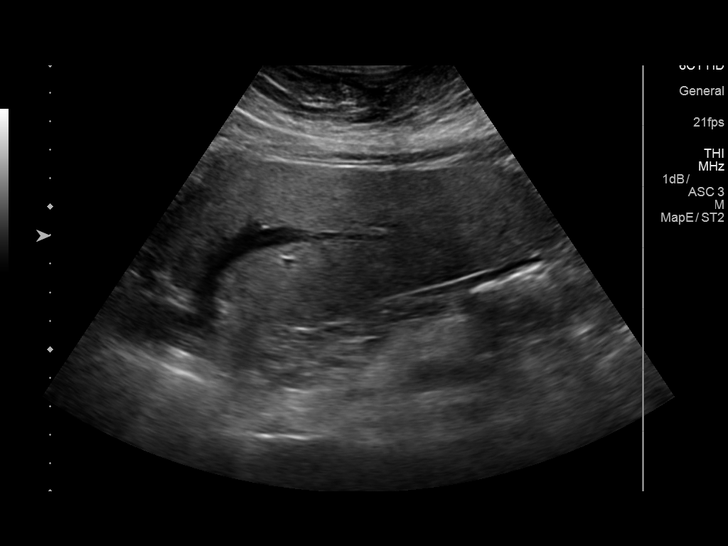
[im 22/87]
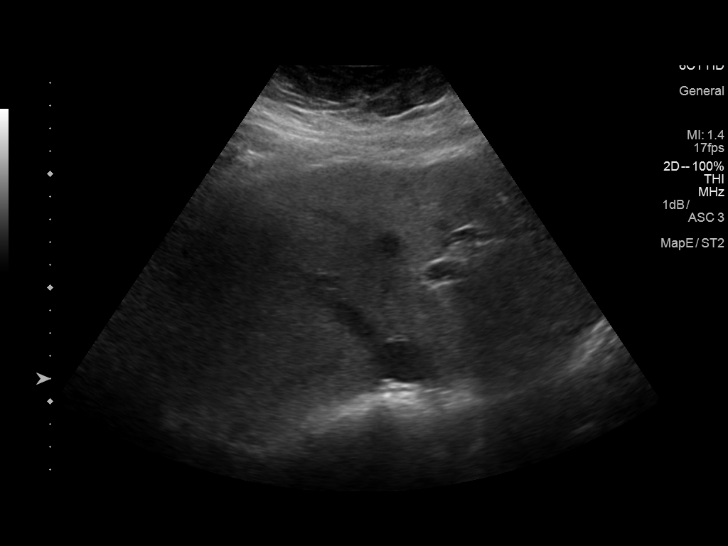
[im 29/87]
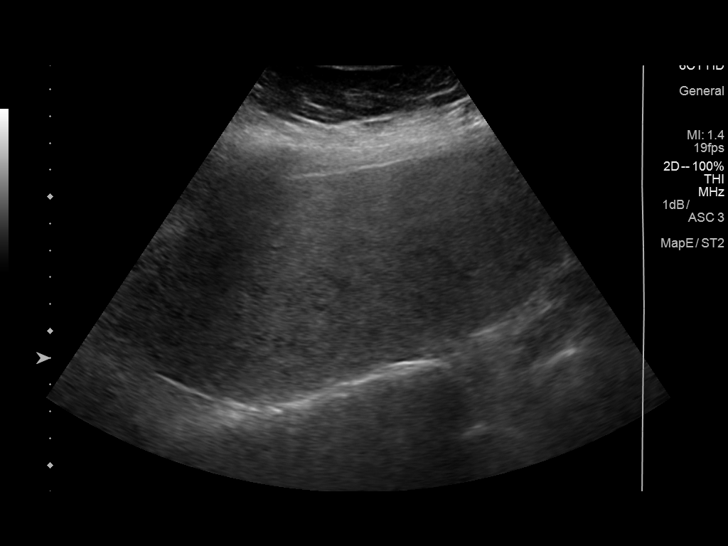
[im 36/87]
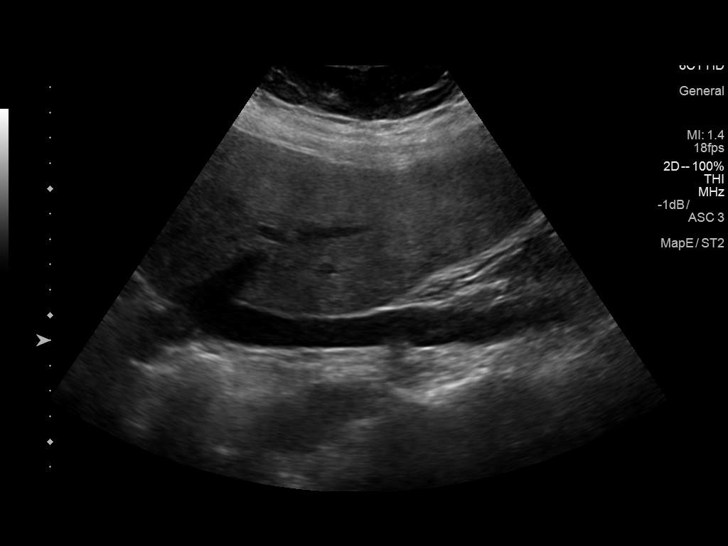
[im 44/87]
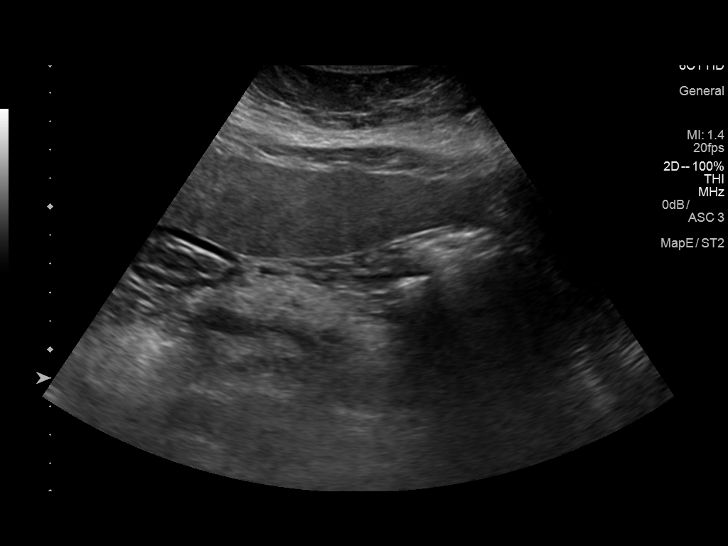
[im 51/87]
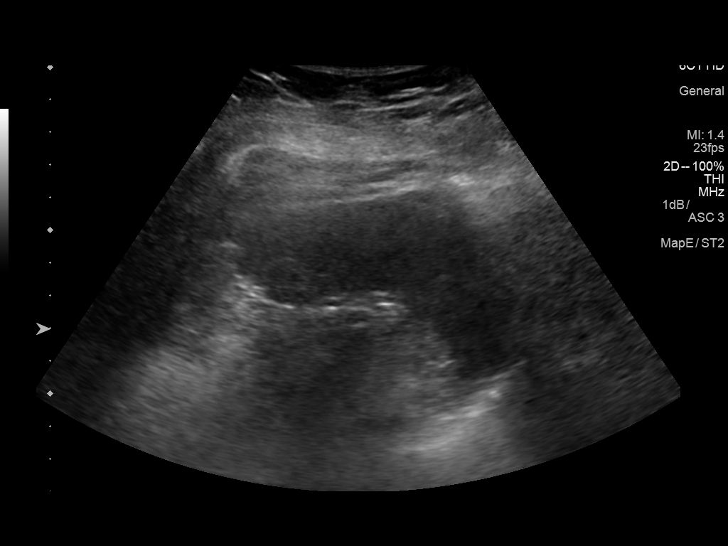
[im 58/87]
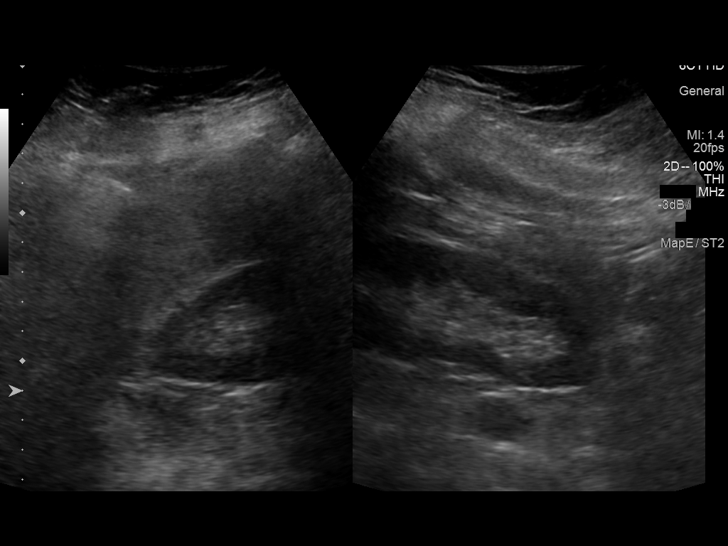
[im 65/87]
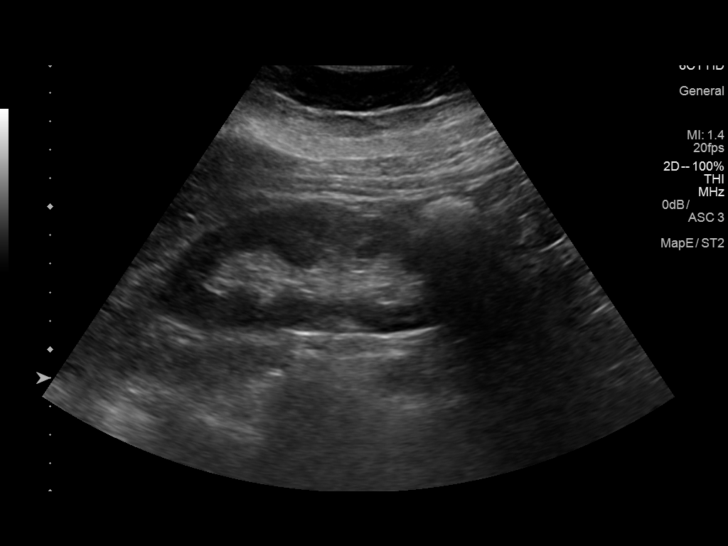
[im 72/87]
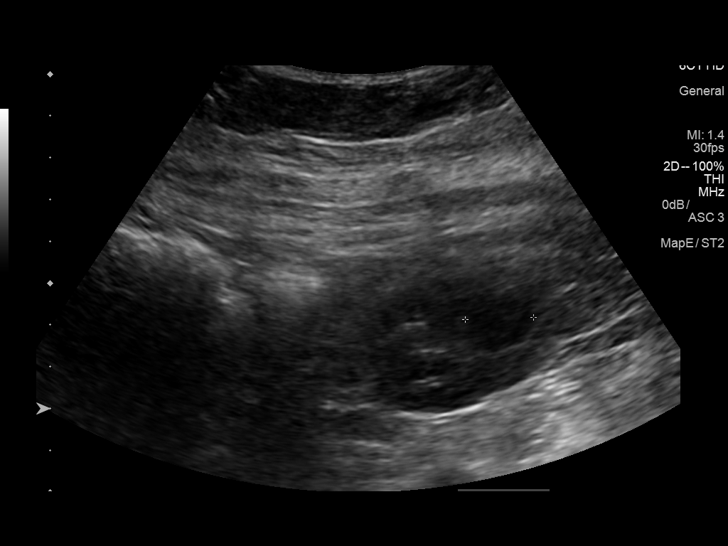
[im 79/87]
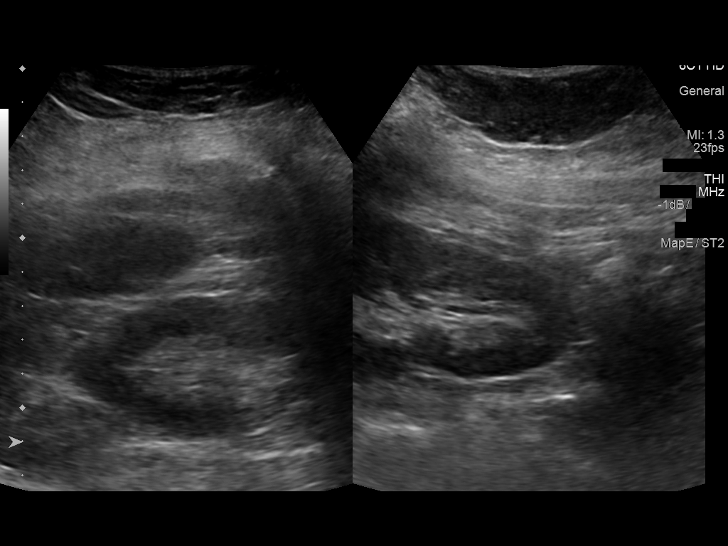
[im 87/87]
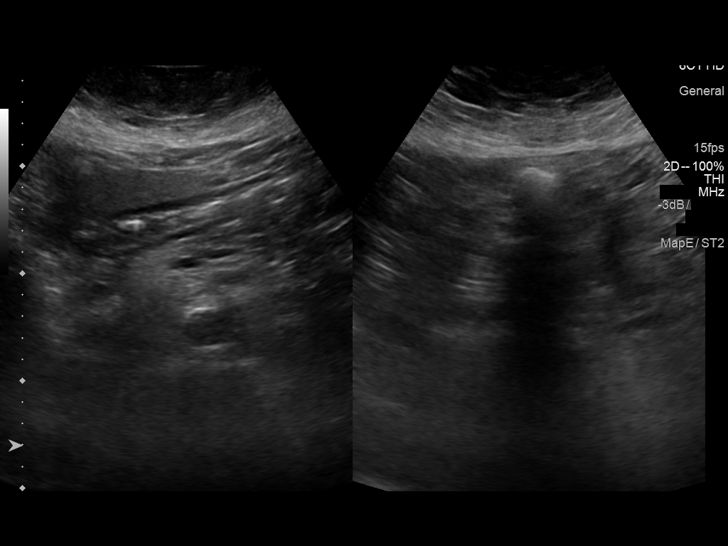

[13 of 25 positions shown; findings below may reference images not displayed]

FINDINGS: Gallbladder: No gallstones or wall thickening visualized. There is
no pericholecystic fluid. No sonographic Murphy sign noted by
sonographer.

Common bile duct: Diameter: 4 mm. No intrahepatic, common hepatic,
or common bile duct dilatation.

Liver: No focal lesion identified. Within normal limits in
parenchymal echogenicity. Portal vein is patent on color Doppler
imaging with normal direction of blood flow towards the liver.

IVC: No abnormality visualized.

Pancreas: Visualized portion unremarkable. Portions of pancreatic
tail obscured by gas.

Spleen: Size and appearance within normal limits.

Right Kidney: Length: 11.5 cm. Echogenicity within normal limits. No
mass or hydronephrosis visualized.

Left Kidney: Length: 11.2 cm. Echogenicity within normal limits. No
hydronephrosis visualized. There is a cyst arising from the lower
pole left kidney measuring 1.8 x 1.5 x 1.6 cm.

Abdominal aorta: No aneurysm visualized.

Other findings: No demonstrable ascites.
IMPRESSION: 1. Portions of pancreas obscured by gas. Visualized portions of
pancreas appear normal.

2.  Cyst arising from lower pole left kidney.

3.  Study otherwise unremarkable.

## 2020-09-20 IMAGING — MR MR HEAD WO/W CM
9 of 11 series · 26 of 48 positions shown · IV contrast (gadavist)
Comparison: 01/27/2018

CLINICAL DATA: Vertigo and vomiting over the last 6 weeks. Weight
loss. Esophageal cancer. Cerebellar metastasis.

EXAM:
MRI HEAD WITHOUT AND WITH CONTRAST
TECHNIQUE: Multiplanar, multiecho pulse sequences of the brain and surrounding
structures were obtained without and with intravenous contrast.
CONTRAST:  10 cc Gadavist

[Series 2: FLAIR · sagittal · 3.0mm · 0.47mm/px · 3 of 41 slices shown (1 of 3)]
[im 1/41]
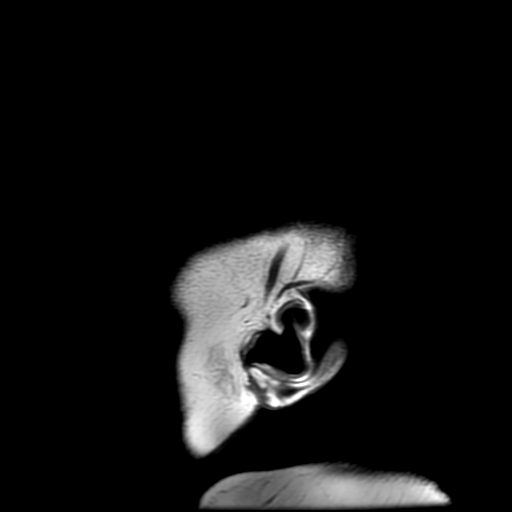
[im 21/41]
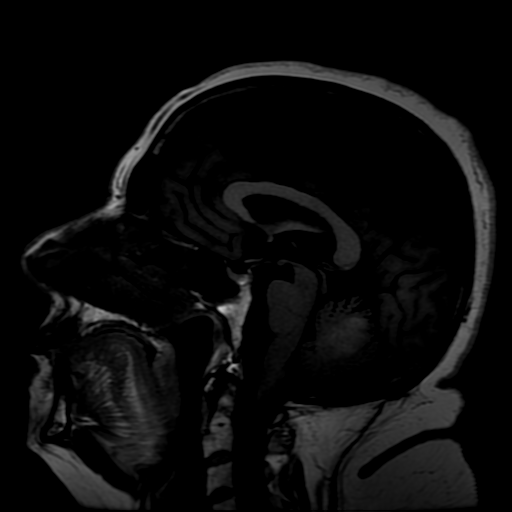
[im 41/41]
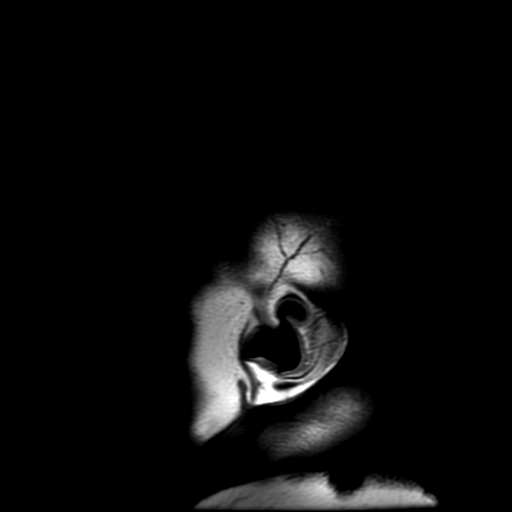

[Series 4: DWI · axial · 3.0mm · 0.94mm/px · z∈[-17,+140]mm · 7 of 110 slices shown]
[im 1/110]
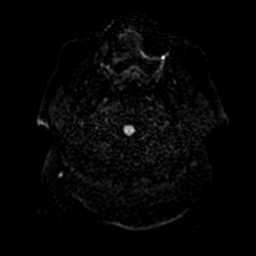
[im 19/110]
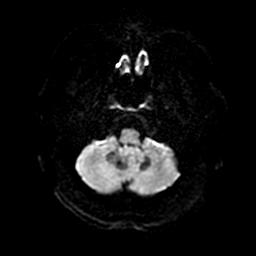
[im 37/110]
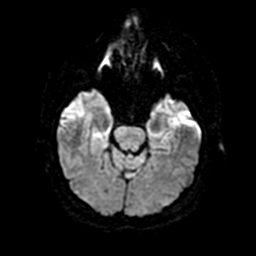
[im 55/110]
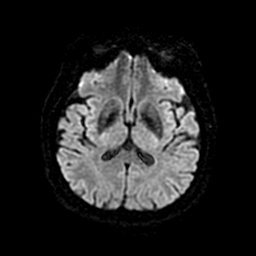
[im 73/110]
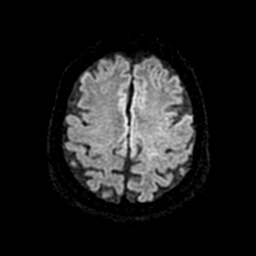
[im 91/110]
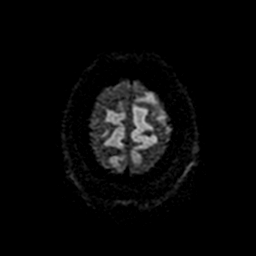
[im 110/110]
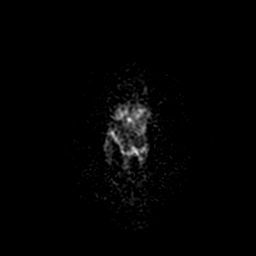

[Series 5: T2 · axial · 5.0mm · 0.47mm/px · z∈[-27,+138]mm · 2 of 29 slices shown]
[im 1/29]
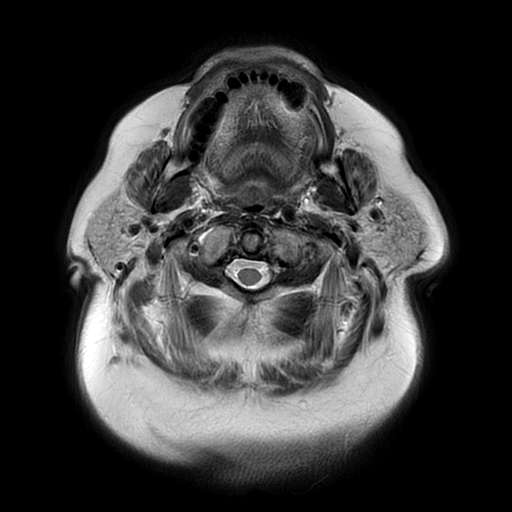
[im 29/29]
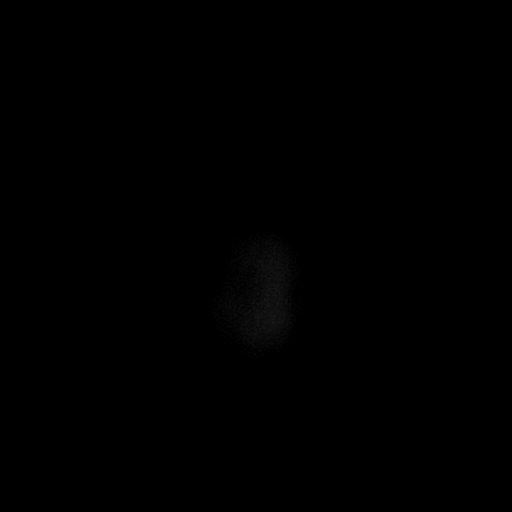

[Series 6: FLAIR · axial · 3.0mm · 0.47mm/px · z∈[-57,+114]mm · 3 of 58 slices shown (2 of 3)]
[im 1/58]
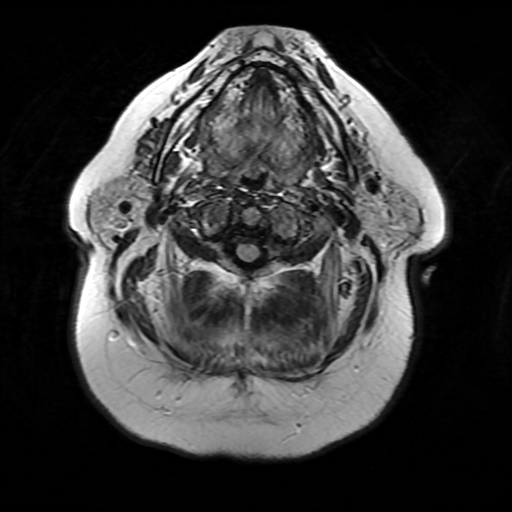
[im 29/58]
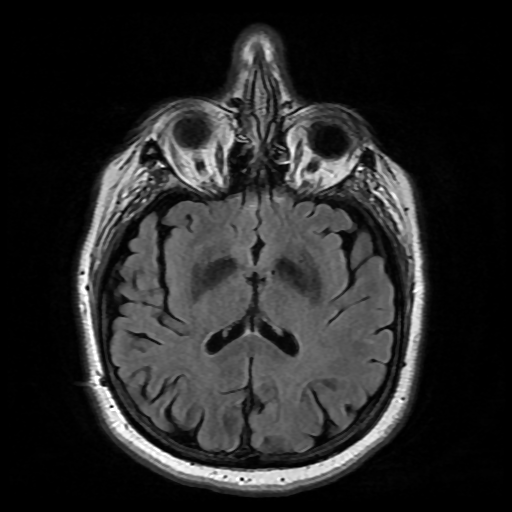
[im 58/58]
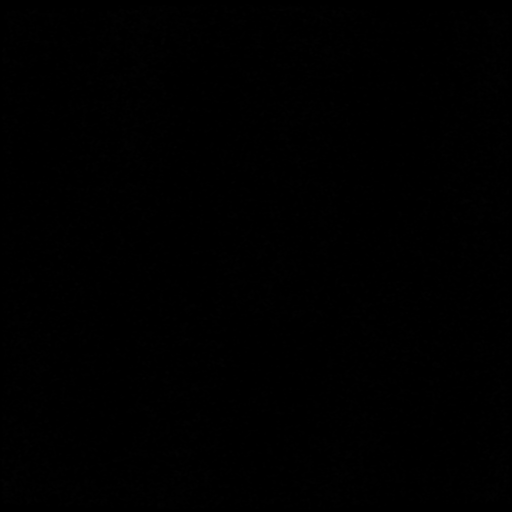

[Series 7: GRE · axial · 5.0mm · 0.47mm/px · z∈[-27,+138]mm · 2 of 29 slices shown]
[im 1/29]
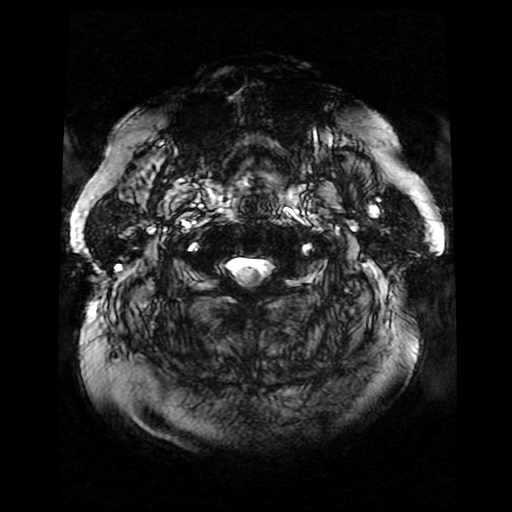
[im 29/29]
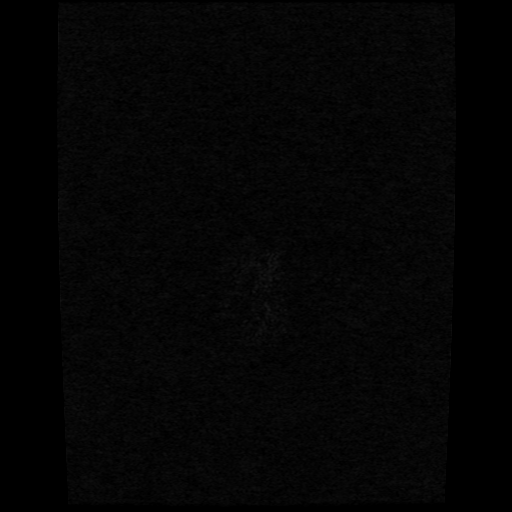

[Series 9: T2 post-contrast · coronal · 3.0mm · 0.39mm/px · 3 of 56 slices shown]
[im 1/56]
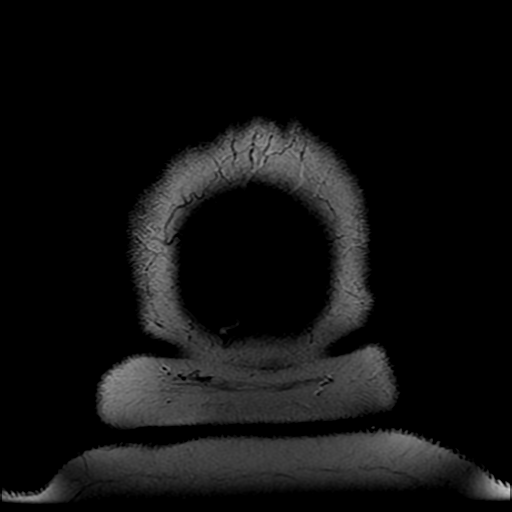
[im 28/56]
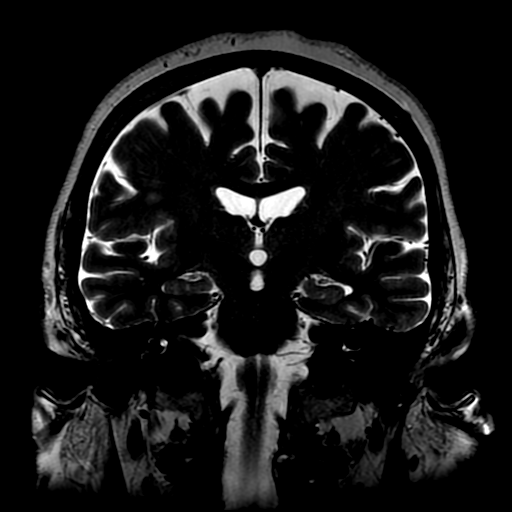
[im 56/56]
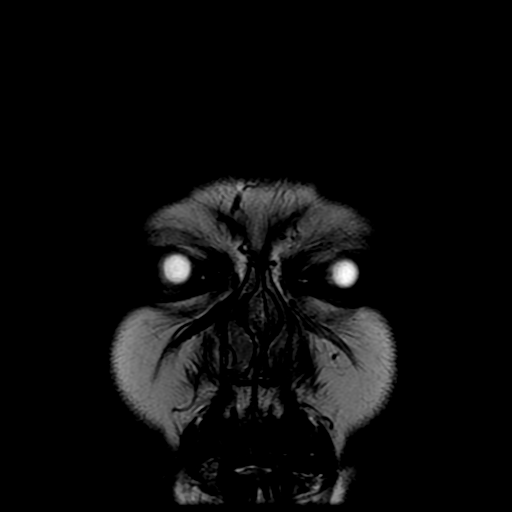

[Series 11: T1 · coronal · 3.0mm · 0.43mm/px · 1 of 56 slices shown]
[im 1/56]
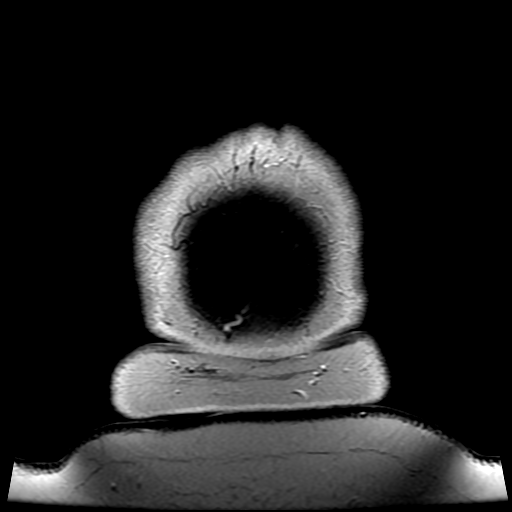

[Series 12: FLAIR · sagittal · 3.0mm · 0.47mm/px · 2 of 41 slices shown (3 of 3)]
[im 1/41]
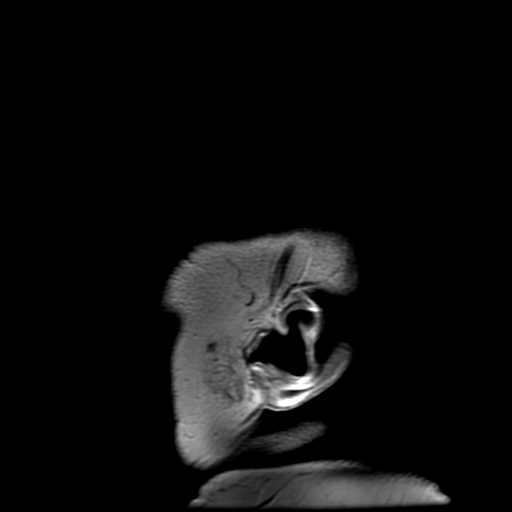
[im 41/41]
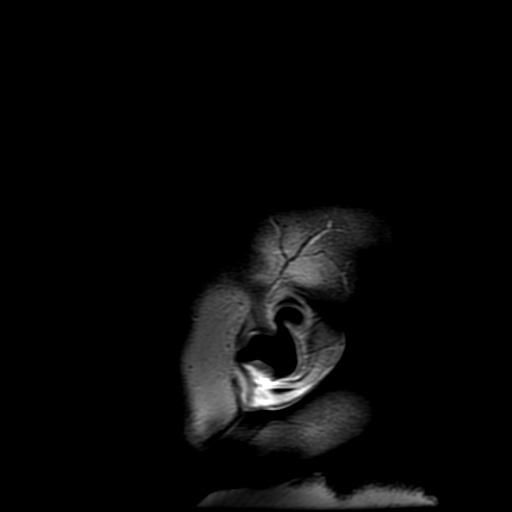

[Series 450: ADC · axial · 3.0mm · 0.94mm/px · z∈[-17,+140]mm · 3 of 55 slices shown]
[im 1/55]
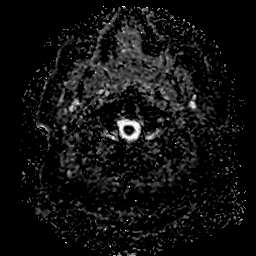
[im 28/55]
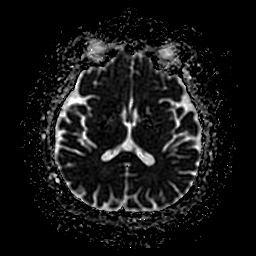
[im 55/55]
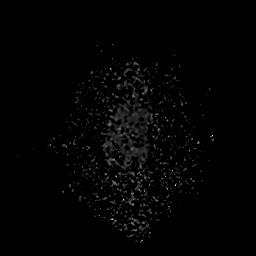

[26 of 48 positions shown; findings below may reference images not displayed]

FINDINGS: Brain: No additional lesions are disclosed. There is a 2.9 x 2.6 x
3.0 cm metastasis in the right superior cerebellum with somewhat
lobular contours and mild surrounding vasogenic edema. The lesion is
uniformly hyperdense at CT in shows fairly uniform low T2 signal on
MRI, favoring psammomatous calcification rather than hemorrhage.
There are probably some minor petechial hemorrhagic components.
Elsewhere, there are mild to moderate chronic small-vessel ischemic
changes of the cerebral hemispheric white matter. No hydrocephalus
or extra-axial collection.

Vascular: Major vessels at the base of the brain show flow.

Skull and upper cervical spine: Negative

Sinuses/Orbits: Clear/normal

Other: None
IMPRESSION: 3 Tesla study does not disclose any additional lesions. Solitary
metastasis in the right superior cerebellum measuring 2.9 x 2.6 x
3.0 cm with mild surrounding edema. Probable psammomatous
calcification.
